# Patient Record
Sex: Male | Born: 1970 | Race: Black or African American | Hispanic: No | Marital: Single | Smoking: Former smoker
Health system: Southern US, Community
[De-identification: ages and names within clinical notes are randomized; demographics above are authoritative.]

## PROBLEM LIST (undated history)

## (undated) DIAGNOSIS — C419 Malignant neoplasm of bone and articular cartilage, unspecified: Secondary | ICD-10-CM

## (undated) DIAGNOSIS — E079 Disorder of thyroid, unspecified: Secondary | ICD-10-CM

## (undated) DIAGNOSIS — I10 Essential (primary) hypertension: Secondary | ICD-10-CM

## (undated) DIAGNOSIS — C801 Malignant (primary) neoplasm, unspecified: Secondary | ICD-10-CM

## (undated) DIAGNOSIS — J189 Pneumonia, unspecified organism: Secondary | ICD-10-CM

## (undated) DIAGNOSIS — Z923 Personal history of irradiation: Secondary | ICD-10-CM

## (undated) DIAGNOSIS — T7840XA Allergy, unspecified, initial encounter: Secondary | ICD-10-CM

## (undated) HISTORY — PX: TENDON REPAIR: SHX5111

## (undated) HISTORY — DX: Personal history of irradiation: Z92.3

## (undated) HISTORY — PX: THYROIDECTOMY: SHX17

---

## 2006-05-18 ENCOUNTER — Ambulatory Visit: Payer: Self-pay | Admitting: Nurse Practitioner

## 2006-05-19 ENCOUNTER — Ambulatory Visit (HOSPITAL_COMMUNITY): Admission: RE | Admit: 2006-05-19 | Discharge: 2006-05-19 | Payer: Self-pay | Admitting: Family Medicine

## 2007-03-21 ENCOUNTER — Emergency Department (HOSPITAL_COMMUNITY): Admission: EM | Admit: 2007-03-21 | Discharge: 2007-03-21 | Payer: Self-pay | Admitting: Family Medicine

## 2007-03-22 ENCOUNTER — Ambulatory Visit: Payer: Self-pay | Admitting: *Deleted

## 2007-04-06 ENCOUNTER — Emergency Department (HOSPITAL_COMMUNITY): Admission: EM | Admit: 2007-04-06 | Discharge: 2007-04-06 | Payer: Self-pay | Admitting: Family Medicine

## 2007-05-02 ENCOUNTER — Encounter (INDEPENDENT_AMBULATORY_CARE_PROVIDER_SITE_OTHER): Payer: Self-pay | Admitting: *Deleted

## 2007-05-14 ENCOUNTER — Emergency Department (HOSPITAL_COMMUNITY): Admission: EM | Admit: 2007-05-14 | Discharge: 2007-05-14 | Payer: Self-pay | Admitting: Family Medicine

## 2007-06-04 DIAGNOSIS — M2040 Other hammer toe(s) (acquired), unspecified foot: Secondary | ICD-10-CM

## 2012-11-16 ENCOUNTER — Emergency Department: Payer: Self-pay | Admitting: Emergency Medicine

## 2012-12-10 ENCOUNTER — Emergency Department: Payer: Self-pay | Admitting: Emergency Medicine

## 2012-12-10 LAB — COMPREHENSIVE METABOLIC PANEL
Albumin: 3.5 g/dL (ref 3.4–5.0)
Alkaline Phosphatase: 100 U/L (ref 50–136)
Anion Gap: 4 — ABNORMAL LOW (ref 7–16)
Calcium, Total: 8.6 mg/dL (ref 8.5–10.1)
Chloride: 103 mmol/L (ref 98–107)
Co2: 31 mmol/L (ref 21–32)
Creatinine: 1.05 mg/dL (ref 0.60–1.30)
EGFR (African American): >60
EGFR (Non-African Amer.): >60
Glucose: 110 mg/dL — ABNORMAL HIGH (ref 65–99)
SGOT(AST): 22 U/L (ref 15–37)
SGPT (ALT): 19 U/L (ref 12–78)
Sodium: 138 mmol/L (ref 136–145)
Total Protein: 7.1 g/dL (ref 6.4–8.2)

## 2012-12-10 LAB — URINALYSIS, COMPLETE
Nitrite: NEGATIVE
Ph: 6 (ref 4.5–8.0)
Protein: NEGATIVE
Specific Gravity: 1.02 (ref 1.003–1.030)
WBC UR: <1 /HPF (ref 0–5)

## 2012-12-10 LAB — CBC
MCH: 29.6 pg (ref 26.0–34.0)
MCHC: 33.2 g/dL (ref 32.0–36.0)
Platelet: 327 10*3/uL (ref 150–440)
RBC: 4.74 10*6/uL (ref 4.40–5.90)
WBC: 5.9 10*3/uL (ref 3.8–10.6)

## 2012-12-10 LAB — LIPASE, BLOOD: Lipase: 253 U/L (ref 73–393)

## 2013-01-01 ENCOUNTER — Ambulatory Visit: Payer: Self-pay | Admitting: Oncology

## 2013-01-01 LAB — COMPREHENSIVE METABOLIC PANEL
Alkaline Phosphatase: 92 U/L (ref 50–136)
Anion Gap: 6 — ABNORMAL LOW (ref 7–16)
BUN: 13 mg/dL (ref 7–18)
Bilirubin,Total: 0.4 mg/dL (ref 0.2–1.0)
Co2: 31 mmol/L (ref 21–32)
Glucose: 83 mg/dL (ref 65–99)
Osmolality: 279 (ref 275–301)
Potassium: 4.3 mmol/L (ref 3.5–5.1)
SGOT(AST): 15 U/L (ref 15–37)
SGPT (ALT): 22 U/L (ref 12–78)

## 2013-01-01 LAB — PROTIME-INR: INR: 0.9

## 2013-01-01 LAB — CBC CANCER CENTER
Basophil #: 0 x10 3/mm (ref 0.0–0.1)
Basophil %: 0.7 %
Eosinophil %: 2.2 %
HCT: 43.4 % (ref 40.0–52.0)
HGB: 14.6 g/dL (ref 13.0–18.0)
MCH: 30 pg (ref 26.0–34.0)
MCV: 90 fL (ref 80–100)
Monocyte #: 0.6 x10 3/mm (ref 0.2–1.0)
Monocyte %: 10 %
Neutrophil #: 3.1 x10 3/mm (ref 1.4–6.5)
RBC: 4.86 10*6/uL (ref 4.40–5.90)

## 2013-01-01 LAB — APTT: Activated PTT: 30.2 secs (ref 23.6–35.9)

## 2013-01-02 LAB — CEA: CEA: 1.4 ng/mL (ref 0.0–4.7)

## 2013-01-02 LAB — PSA: PSA: 1.7 ng/mL (ref 0.0–4.0)

## 2013-01-02 LAB — CANCER ANTIGEN 19-9: CA 19-9: 43 U/mL — ABNORMAL HIGH (ref 0–35)

## 2013-01-03 ENCOUNTER — Ambulatory Visit: Payer: Self-pay | Admitting: Oncology

## 2013-01-11 ENCOUNTER — Ambulatory Visit: Payer: Self-pay | Admitting: Oncology

## 2013-01-13 ENCOUNTER — Ambulatory Visit: Payer: Self-pay | Admitting: Oncology

## 2013-01-16 LAB — PATHOLOGY REPORT

## 2013-01-19 LAB — AFP TUMOR MARKER: AFP-Tumor Marker: 3.7 ng/mL (ref 0.0–8.3)

## 2013-01-19 LAB — BETA HCG QUANT (REF LAB)

## 2013-01-19 LAB — CEA: CEA: 1.6 ng/mL (ref 0.0–4.7)

## 2013-01-21 ENCOUNTER — Emergency Department: Payer: Self-pay | Admitting: Emergency Medicine

## 2013-01-21 LAB — COMPREHENSIVE METABOLIC PANEL
Anion Gap: 5 — ABNORMAL LOW (ref 7–16)
Calcium, Total: 8.4 mg/dL — ABNORMAL LOW (ref 8.5–10.1)
Chloride: 109 mmol/L — ABNORMAL HIGH (ref 98–107)
Co2: 29 mmol/L (ref 21–32)
Glucose: 91 mg/dL (ref 65–99)
Potassium: 4.1 mmol/L (ref 3.5–5.1)
SGOT(AST): 31 U/L (ref 15–37)
SGPT (ALT): 18 U/L (ref 12–78)
Sodium: 143 mmol/L (ref 136–145)

## 2013-01-21 LAB — CBC
MCHC: 32.8 g/dL (ref 32.0–36.0)
MCV: 91 fL (ref 80–100)
Platelet: 363 10*3/uL (ref 150–440)
RBC: 4.31 10*6/uL — ABNORMAL LOW (ref 4.40–5.90)
WBC: 7 10*3/uL (ref 3.8–10.6)

## 2013-01-21 LAB — URINALYSIS, COMPLETE
Bacteria: NONE SEEN
Blood: NEGATIVE
Glucose,UR: NEGATIVE mg/dL (ref 0–75)
Ketone: NEGATIVE
Leukocyte Esterase: NEGATIVE
Nitrite: NEGATIVE
Ph: 5 (ref 4.5–8.0)
Specific Gravity: 1.008 (ref 1.003–1.030)
Squamous Epithelial: <1
WBC UR: <1 /HPF (ref 0–5)

## 2013-01-21 LAB — ETHANOL: Ethanol: <3 mg/dL

## 2013-01-21 LAB — LIPASE, BLOOD: Lipase: 433 U/L — ABNORMAL HIGH (ref 73–393)

## 2013-01-22 ENCOUNTER — Emergency Department: Payer: Self-pay | Admitting: Emergency Medicine

## 2013-01-22 LAB — COMPREHENSIVE METABOLIC PANEL
Albumin: 3.3 g/dL — ABNORMAL LOW (ref 3.4–5.0)
Alkaline Phosphatase: 84 U/L (ref 50–136)
Chloride: 106 mmol/L (ref 98–107)
Creatinine: 1.03 mg/dL (ref 0.60–1.30)
EGFR (African American): >60
EGFR (Non-African Amer.): >60
Glucose: 89 mg/dL (ref 65–99)
Osmolality: 280 (ref 275–301)
Potassium: 3.7 mmol/L (ref 3.5–5.1)
SGOT(AST): 19 U/L (ref 15–37)
SGPT (ALT): 16 U/L (ref 12–78)
Sodium: 141 mmol/L (ref 136–145)
Total Protein: 6.4 g/dL (ref 6.4–8.2)

## 2013-01-22 LAB — URINALYSIS, COMPLETE
Bilirubin,UR: NEGATIVE
Blood: NEGATIVE
Glucose,UR: NEGATIVE mg/dL (ref 0–75)
Ketone: NEGATIVE
Ketone: NEGATIVE
Leukocyte Esterase: NEGATIVE
Nitrite: NEGATIVE
Nitrite: NEGATIVE
Ph: 6 (ref 4.5–8.0)
Ph: 7 (ref 4.5–8.0)
Protein: NEGATIVE
RBC,UR: <1 /HPF (ref 0–5)
Specific Gravity: 1.014 (ref 1.003–1.030)
Specific Gravity: 1.004 (ref 1.003–1.030)
Squamous Epithelial: <1
WBC UR: <1 /HPF (ref 0–5)

## 2013-01-22 LAB — CBC
HCT: 37.7 % — ABNORMAL LOW (ref 40.0–52.0)
MCH: 30.1 pg (ref 26.0–34.0)
MCHC: 33.4 g/dL (ref 32.0–36.0)
MCV: 90 fL (ref 80–100)
Platelet: 333 10*3/uL (ref 150–440)
RBC: 4.19 10*6/uL — ABNORMAL LOW (ref 4.40–5.90)
RDW: 13.9 % (ref 11.5–14.5)
WBC: 7.2 10*3/uL (ref 3.8–10.6)

## 2013-01-23 ENCOUNTER — Ambulatory Visit: Payer: Self-pay | Admitting: Unknown Physician Specialty

## 2013-01-29 LAB — CALCIUM
Calcium, Total: 8.9 mg/dL (ref 8.5–10.1)
Calcium, Total: 8.6 mg/dL (ref 8.5–10.1)

## 2013-01-30 ENCOUNTER — Inpatient Hospital Stay: Payer: Self-pay | Admitting: Oncology

## 2013-01-30 LAB — CALCIUM: Calcium, Total: 8.6 mg/dL (ref 8.5–10.1)

## 2013-01-30 LAB — PATHOLOGY REPORT

## 2013-02-03 LAB — COMPREHENSIVE METABOLIC PANEL
Albumin: 3 g/dL — ABNORMAL LOW (ref 3.4–5.0)
Anion Gap: 7 (ref 7–16)
BUN: 8 mg/dL (ref 7–18)
Calcium, Total: 8.8 mg/dL (ref 8.5–10.1)
Chloride: 105 mmol/L (ref 98–107)
EGFR (African American): >60
Osmolality: 278 (ref 275–301)
Potassium: 4.2 mmol/L (ref 3.5–5.1)
SGPT (ALT): 21 U/L (ref 12–78)
Sodium: 140 mmol/L (ref 136–145)
Total Protein: 6.1 g/dL — ABNORMAL LOW (ref 6.4–8.2)

## 2013-02-03 LAB — CBC WITH DIFFERENTIAL/PLATELET
Basophil %: 0.4 %
Eosinophil #: 0 10*3/uL (ref 0.0–0.7)
Eosinophil %: 0.3 %
HCT: 37.6 % — ABNORMAL LOW (ref 40.0–52.0)
Lymphocyte #: 1.5 10*3/uL (ref 1.0–3.6)
Lymphocyte %: 23.3 %
MCH: 29.9 pg (ref 26.0–34.0)
MCHC: 33.4 g/dL (ref 32.0–36.0)
MCV: 90 fL (ref 80–100)
Neutrophil #: 4.2 10*3/uL (ref 1.4–6.5)
Neutrophil %: 65.1 %
Platelet: 284 10*3/uL (ref 150–440)
RBC: 4.2 10*6/uL — ABNORMAL LOW (ref 4.40–5.90)
WBC: 6.5 10*3/uL (ref 3.8–10.6)

## 2013-02-12 ENCOUNTER — Ambulatory Visit: Payer: Self-pay | Admitting: Oncology

## 2013-03-15 ENCOUNTER — Ambulatory Visit: Payer: Self-pay | Admitting: Oncology

## 2013-03-25 LAB — TSH: Thyroid Stimulating Horm: 69.8 u[IU]/mL — ABNORMAL HIGH

## 2013-04-02 ENCOUNTER — Inpatient Hospital Stay: Payer: Self-pay | Admitting: Oncology

## 2013-04-02 LAB — CBC WITH DIFFERENTIAL/PLATELET
Basophil #: 0.1 10*3/uL (ref 0.0–0.1)
Basophil %: 0.9 %
Eosinophil #: 0.1 10*3/uL (ref 0.0–0.7)
HCT: 40.4 % (ref 40.0–52.0)
HGB: 13.6 g/dL (ref 13.0–18.0)
Lymphocyte #: 1.2 10*3/uL (ref 1.0–3.6)
Lymphocyte %: 19.8 %
MCHC: 33.8 g/dL (ref 32.0–36.0)
Monocyte #: 0.6 x10 3/mm (ref 0.2–1.0)
Monocyte %: 10.2 %
Neutrophil %: 68 %
RBC: 4.41 10*6/uL (ref 4.40–5.90)
RDW: 15.9 % — ABNORMAL HIGH (ref 11.5–14.5)

## 2013-04-02 LAB — COMPREHENSIVE METABOLIC PANEL
Alkaline Phosphatase: 96 U/L (ref 50–136)
Anion Gap: 4 — ABNORMAL LOW (ref 7–16)
BUN: 16 mg/dL (ref 7–18)
EGFR (African American): >60
EGFR (Non-African Amer.): >60
Glucose: 69 mg/dL (ref 65–99)
SGPT (ALT): 29 U/L (ref 12–78)
Sodium: 138 mmol/L (ref 136–145)
Total Protein: 5.9 g/dL — ABNORMAL LOW (ref 6.4–8.2)

## 2013-04-13 ENCOUNTER — Emergency Department: Payer: Self-pay | Admitting: Emergency Medicine

## 2013-04-13 LAB — COMPREHENSIVE METABOLIC PANEL
Albumin: 3.3 g/dL — ABNORMAL LOW (ref 3.4–5.0)
Alkaline Phosphatase: 107 U/L (ref 50–136)
BUN: 16 mg/dL (ref 7–18)
Bilirubin,Total: 0.6 mg/dL (ref 0.2–1.0)
Calcium, Total: 8.7 mg/dL (ref 8.5–10.1)
Creatinine: 1.15 mg/dL (ref 0.60–1.30)
EGFR (Non-African Amer.): >60
Glucose: 84 mg/dL (ref 65–99)
Potassium: 3.8 mmol/L (ref 3.5–5.1)
SGOT(AST): 15 U/L (ref 15–37)

## 2013-04-13 LAB — URINALYSIS, COMPLETE
Blood: NEGATIVE
Glucose,UR: NEGATIVE mg/dL (ref 0–75)
Ketone: NEGATIVE
Nitrite: NEGATIVE
Ph: 5 (ref 4.5–8.0)
Protein: NEGATIVE
RBC,UR: <1 /HPF (ref 0–5)
Squamous Epithelial: <1
WBC UR: 4 /HPF (ref 0–5)

## 2013-04-13 LAB — CBC
MCHC: 33.5 g/dL (ref 32.0–36.0)
MCV: 92 fL (ref 80–100)
RBC: 4.51 10*6/uL (ref 4.40–5.90)
WBC: 6.1 10*3/uL (ref 3.8–10.6)

## 2013-04-15 ENCOUNTER — Ambulatory Visit: Payer: Self-pay | Admitting: Oncology

## 2013-07-26 ENCOUNTER — Emergency Department (HOSPITAL_COMMUNITY): Payer: Medicaid Other

## 2013-07-26 ENCOUNTER — Emergency Department (HOSPITAL_COMMUNITY)
Admission: EM | Admit: 2013-07-26 | Discharge: 2013-07-26 | Discharge status: Against Medical Advice | Payer: Medicaid Other | Attending: Emergency Medicine | Admitting: Emergency Medicine

## 2013-07-26 ENCOUNTER — Encounter (HOSPITAL_COMMUNITY): Payer: Self-pay | Admitting: Emergency Medicine

## 2013-07-26 DIAGNOSIS — Z79899 Other long term (current) drug therapy: Secondary | ICD-10-CM | POA: Insufficient documentation

## 2013-07-26 DIAGNOSIS — M8448XA Pathological fracture, other site, initial encounter for fracture: Secondary | ICD-10-CM | POA: Insufficient documentation

## 2013-07-26 DIAGNOSIS — E079 Disorder of thyroid, unspecified: Secondary | ICD-10-CM | POA: Insufficient documentation

## 2013-07-26 DIAGNOSIS — Z859 Personal history of malignant neoplasm, unspecified: Secondary | ICD-10-CM | POA: Insufficient documentation

## 2013-07-26 DIAGNOSIS — C50919 Malignant neoplasm of unspecified site of unspecified female breast: Secondary | ICD-10-CM | POA: Insufficient documentation

## 2013-07-26 DIAGNOSIS — C799 Secondary malignant neoplasm of unspecified site: Secondary | ICD-10-CM

## 2013-07-26 DIAGNOSIS — Z791 Long term (current) use of non-steroidal anti-inflammatories (NSAID): Secondary | ICD-10-CM | POA: Insufficient documentation

## 2013-07-26 DIAGNOSIS — M542 Cervicalgia: Secondary | ICD-10-CM | POA: Insufficient documentation

## 2013-07-26 DIAGNOSIS — I10 Essential (primary) hypertension: Secondary | ICD-10-CM | POA: Insufficient documentation

## 2013-07-26 DIAGNOSIS — Z9889 Other specified postprocedural states: Secondary | ICD-10-CM | POA: Insufficient documentation

## 2013-07-26 DIAGNOSIS — C7951 Secondary malignant neoplasm of bone: Secondary | ICD-10-CM | POA: Insufficient documentation

## 2013-07-26 DIAGNOSIS — Z87891 Personal history of nicotine dependence: Secondary | ICD-10-CM | POA: Insufficient documentation

## 2013-07-26 HISTORY — DX: Disorder of thyroid, unspecified: E07.9

## 2013-07-26 HISTORY — DX: Malignant (primary) neoplasm, unspecified: C80.1

## 2013-07-26 HISTORY — DX: Essential (primary) hypertension: I10

## 2013-07-26 LAB — URINE MICROSCOPIC-ADD ON

## 2013-07-26 LAB — CBC WITH DIFFERENTIAL/PLATELET
Basophils Relative: 0 % (ref 0–1)
Eosinophils Absolute: 0.1 10*3/uL (ref 0.0–0.7)
Eosinophils Relative: 1 % (ref 0–5)
MCH: 33.4 pg (ref 26.0–34.0)
MCHC: 32.9 g/dL (ref 30.0–36.0)
Neutrophils Relative %: 66 % (ref 43–77)
Platelets: 254 10*3/uL (ref 150–400)
RDW: 14.1 % (ref 11.5–15.5)

## 2013-07-26 LAB — URINALYSIS, ROUTINE W REFLEX MICROSCOPIC
Bilirubin Urine: NEGATIVE
Nitrite: POSITIVE — AB
Protein, ur: NEGATIVE mg/dL
Specific Gravity, Urine: 1.018 (ref 1.005–1.030)
Urobilinogen, UA: 1 mg/dL (ref 0.0–1.0)

## 2013-07-26 LAB — BASIC METABOLIC PANEL
Calcium: 9 mg/dL (ref 8.4–10.5)
GFR calc Af Amer: 75 mL/min — ABNORMAL LOW (ref >90)
GFR calc non Af Amer: 65 mL/min — ABNORMAL LOW (ref >90)
Potassium: 3.4 mEq/L — ABNORMAL LOW (ref 3.5–5.1)
Sodium: 138 mEq/L (ref 135–145)

## 2013-07-26 MED ORDER — ONDANSETRON HCL 4 MG/2ML IJ SOLN: 4.0000 mg | Freq: Once | INTRAMUSCULAR | Status: DC

## 2013-07-26 MED ORDER — HYDROMORPHONE HCL PF 1 MG/ML IJ SOLN: 1.0000 mg | Freq: Once | INTRAMUSCULAR | Status: DC

## 2013-07-26 MED ORDER — IOHEXOL 300 MG/ML  SOLN
80.0000 mL | Freq: Once | INTRAMUSCULAR | Status: AC | PRN
  Administered 2013-07-26: 80 mL via INTRAVENOUS

## 2013-07-26 NOTE — ED Notes (Signed)
Pt informed that CT scans are down and it might take more time for him to go to CT. Pt angry and got out his room looking for a Doctor, states "this is a conspiracy." Pt informed that if he needs to talk to the doctor, we'll let the doctor know. Pt informed that he needs to stay in his room. Pt calling names using [facility]. Security made aware.

## 2013-07-26 NOTE — ED Notes (Signed)
Pt has thyroid cancer diagnosed in May that is being treated at [facility].  Pt began having R arm (around collar bone) and neck pain about 3 weeks ago.  Pt presents today for evaluation of that pain.

## 2013-07-26 NOTE — ED Provider Notes (Signed)
CSN: [id number]     Arrival date & time 07/26/13  1316 History   First MD Initiated Contact with Patient 07/26/13 1511     Chief Complaint  Patient presents with  . Arm Pain  . Neck Pain   (Consider location/radiation/quality/duration/timing/severity/associated sxs/prior Treatment) HPI Comments: Frank Ward is a 42 y.o. Male presents for right shoulder pain that is gradual in onset for several days. He also has noted a nodule in his right lower back for the last several days. He denies weakness, dizziness, nausea, vomiting, fever, chills, shortness of breath, chest pain or problems with urination and bowel movements. He has ongoing lower back pain since he had a biopsy that showed cancer in his vertebra. He stopped seeing his oncologist 3 months ago after a radionuclear treatment. He does not want to go back to see the oncologist because of a "miscommunication". He wants to be referred to another oncologist. He is not taking any medications currently. There are no other known modifying factors.  Patient is a 42 y.o. male presenting with arm pain and neck pain. The history is provided by the patient.  Arm Pain  Neck Pain   Past Medical History  Diagnosis Date  . Cancer     Thyroid  . Thyroid disease   . Hypertension    Past Surgical History  Procedure Laterality Date  . Tendon repair    . Thyroidectomy     No family history on file. History  Substance Use Topics  . Smoking status: Former Games developer  . Smokeless tobacco: Not on file  . Alcohol Use: 4.8 oz/week    8 Cans of beer per week    Review of Systems  Musculoskeletal: Positive for neck pain.  All other systems reviewed and are negative.    Allergies  Review of patient's allergies indicates no known allergies.  Home Medications   Current Outpatient Rx  Name  Route  Sig  Dispense  Refill  . Acetaminophen-Pamabrom 500-25 MG TABS   Oral   Take 1 tablet by mouth 2 (two) times daily as needed (pain).         .  bisacodyl (DULCOLAX) 5 MG EC tablet   Oral   Take 5 mg by mouth 2 (two) times daily.         . Calcium Carb-Cholecalciferol (CALCIUM 500 +D) 500-400 MG-UNIT TABS   Oral   Take 2 tablets by mouth 2 (two) times daily.         Marland Kitchen dexamethasone (DECADRON) 4 MG tablet   Oral   Take 4 mg by mouth daily.         Marland Kitchen levothyroxine (SYNTHROID, LEVOTHROID) 100 MCG tablet   Oral   Take 100 mcg by mouth daily before breakfast.         . Menthol, Topical Analgesic, 5 % PADS   Apply externally   Apply 1 patch topically daily as needed (pain).         . Multiple Vitamin (MULTIVITAMIN WITH MINERALS) TABS tablet   Oral   Take 1 tablet by mouth daily.         . naproxen sodium (ANAPROX) 220 MG tablet   Oral   Take 440 mg by mouth 2 (two) times daily.         . phenazopyridine (PYRIDIUM) 97 MG tablet   Oral   Take 97 mg by mouth every 3 (three) days.          BP 138/98  Pulse 72  Temp(Src) 98.1 F (36.7 C) (Oral)  Resp 18  Ht 6\' 4"  (1.93 m)  Wt 196 lb 1 oz (88.933 kg)  BMI 23.88 kg/m2  SpO2 99% Physical Exam  Nursing note and vitals reviewed. Constitutional: He is oriented to person, place, and time. He appears well-developed and well-nourished. No distress.  HENT:  Head: Normocephalic and atraumatic.  Right Ear: External ear normal.  Left Ear: External ear normal.  Eyes: Conjunctivae and EOM are normal. Pupils are equal, round, and reactive to light.  Neck: Normal range of motion and phonation normal. Neck supple.  Cardiovascular: Normal rate, regular rhythm, normal heart sounds and intact distal pulses.   Pulmonary/Chest: Effort normal and breath sounds normal. He exhibits no bony tenderness.  Abdominal: Soft. Normal appearance. There is no tenderness.  Musculoskeletal: Normal range of motion.  Pain and limited motion of the right shoulder. No shoulder deformity.   Neurological: He is alert and oriented to person, place, and time. No cranial nerve deficit or  sensory deficit. He exhibits normal muscle tone. Coordination normal.  Skin: Skin is warm, dry and intact.  2 cm, mobile nodule in the right trapezius area that appears to be subcutaneous. There is no overlying erythema or skin deformity  Psychiatric: He has a normal mood and affect. His behavior is normal. Judgment and thought content normal.    ED Course  Procedures (including critical care time) Medications  HYDROmorphone (DILAUDID) injection 1 mg (1 mg Intravenous Not Given 07/26/13 1957)  ondansetron (ZOFRAN) injection 4 mg (4 mg Intravenous Not Given 07/26/13 1958)  iohexol (OMNIPAQUE) 300 MG/ML solution 80 mL (80 mLs Intravenous Contrast Given 07/26/13 1853)    Patient Vitals for the past 24 hrs:  BP Temp Temp src Pulse Resp SpO2 Height Weight  07/26/13 1955 - - - - 18 - - -  07/26/13 1952 138/98 mmHg - - 72 - 99 % - -  07/26/13 1753 142/106 mmHg 98.1 F (36.7 C) Oral 79 18 99 % - -  07/26/13 1326 128/96 mmHg 97.7 F (36.5 C) Oral 85 14 98 % 6\' 4"  (1.93 m) 196 lb 1 oz (88.933 kg)    7:27PM-Consult complete with Dr. Danielle Dess. Patient case explained and discussed. 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   20:00- the patient is choosing to leave AGAINST MEDICAL ADVICE. He understands that he is a potential serious complications with the spinal canal tumor. He, states that he will return to the emergency department at [facility] tomorrow, to continue his treatment. He has the capacity to choose to leave.        Labs Review Labs Reviewed  CBC WITH DIFFERENTIAL - Abnormal; Notable for the following:    RBC 3.77 (*)    Hemoglobin 12.6 (*)    HCT 38.3 (*)    MCV 101.6 (*)    Monocytes Relative 13 (*)    All other components within normal limits  BASIC METABOLIC PANEL - Abnormal; Notable for the following:    Potassium 3.4 (*)    GFR calc non Af Amer 65 (*)    GFR calc Af Amer 75 (*)    All other components within  normal limits  URINALYSIS, ROUTINE W REFLEX MICROSCOPIC - Abnormal; Notable for the following:    Color, Urine AMBER (*)    Nitrite POSITIVE (*)    All other components within normal limits  URINE MICROSCOPIC-ADD ON   Imaging Review Ct Chest W  Contrast  07/26/2013   CLINICAL DATA:  Thyroid cancer.  Neck and arm pain.  EXAM: CT CHEST WITH CONTRAST  TECHNIQUE: Multidetector CT imaging of the chest was performed during intravenous contrast administration.  CONTRAST:  80mL OMNIPAQUE IOHEXOL 300 MG/ML  SOLN  COMPARISON:  Most recent CT chest performed 01/21/2013.  FINDINGS: Widespread pulmonary metastatic disease is re- demonstrated and may be slightly improved following I-131 treatment. Index lesion in the left lower lobe now measures 21 x 25 mm as compared with 22 x 26 mm. Similarly other widespread pulmonary nodules remain present but stable to slightly smaller. Previously noted enlarged right thyroid gland, representing tumor, has significantly regressed.  No pathologic adenopathy. Normal heart size. Normal aorta and pulmonary arteries. No pleural effusion or pneumothorax.  Previously demonstrated T9 metastasis is improved with regard to intraspinal tumor and paravertebral extension. Significant destruction of the left side of the T9 vertebral body at that level with moderate loss of height. Osseous metastatic disease at T12 is re- demonstrated but stable. Tumor in the T1 vertebral body appears stable to slightly improved. No definite rib lesions. Upper abdominal structures unremarkable.  IMPRESSION: Stable to slightly improved widespread pulmonary metastatic disease.  Stable to slightly improved osseous metastatic disease in the thoracic spine including T1, T9, and T12.  Please see CT cervical spine dictation for additional findings.   Electronically Signed   By: Davonna Belling M.D.   On: 07/26/2013 19:56   Ct Cervical Spine Wo Contrast  07/26/2013   CLINICAL DATA:  Thyroid cancer.  Right arm pain.   EXAM: CT CERVICAL SPINE WITHOUT CONTRAST  TECHNIQUE: Multidetector CT imaging of the cervical spine was performed without intravenous contrast. Multiplanar CT image reconstructions were also generated.  COMPARISON:  Multiple prior studies.  CT scan performed 01/03/2013.  FINDINGS: There are pathologic fractures involving the C3 and C4 vertebral bodies with near complete destruction of central marrow at both levels. Slight osseous retropulsion at C3 with tumor in the canal resulting in mild stenosis. Canal diameter 5-7 mm at its most narrow opposite C3. Tumor extends into the right greater than left neural foramina at C2-3, C3-4, and C4-5 compressing the exiting nerve roots at that location. These likely contribute to right arm pain. Minor changes of spondylosis at C5-6.  Small Osseous lesions are observed in the C2 vertebral body and spinous process, and C6 spinous process. No other pathologic compression deformities. Thoracic metastatic disease described in the CT chest dictation. Slight prevertebral soft tissue swelling/ tumor is evident anterior to C3 and C4. Airway midline. Cervical lymph nodes bilaterally could be reactive or represent metastatic disease; correlate with PET-CT. Thyroid incompletely evaluated. Lung apices described in CT chest dictation.  IMPRESSION: Pathologic fractures involving the C3 and C4 vertebral bodies. Tumor in the canal and right-sided neural foramina at C2-3, C3-4, and C4-5. These were noted on previous PET scan but have significantly progressed. Further collapse could result in spinal stenosis and cord compression.   Electronically Signed   By: Davonna Belling M.D.   On: 07/26/2013 19:46      EKG Interpretation   None       MDM   1. 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 Patient is high risk for paralysis if he sustains any cervical vertebrae fracture. He understands the seriousness of this  problem. He is choosing to leave AGAINST MEDICAL ADVICE to do some things at home. Prior to returning to the hospital  for treatment.  Nursing Notes Reviewed/ Care Coordinated, and agree without changes. Applicable Imaging Reviewed.  Interpretation of Laboratory Data incorporated into ED treatment   Plan: Discharge AGAINST MEDICAL ADVICE    Flint Melter, MD 07/26/13 2016

## 2013-07-26 NOTE — Consult Note (Signed)
Reason for Consult:metastatic tumor to C3 and C4 Referring Physician: Dr. Mora Bellman Murdaugh is an 42 y.o. male.  HPI: 42y.o. Male with known thyroid cancer. Was treated in [place] system. Came to ER tonight with increasing right arm pain. Ct shows destructive lesion of C3 and C4. Mild canal compromise by CT.  Past Medical History  Diagnosis Date  . Cancer     Thyroid  . Thyroid disease   . Hypertension     Past Surgical History  Procedure Laterality Date  . Tendon repair    . Thyroidectomy      No family history on file.  Social History:  reports that he has quit smoking. He does not have any smokeless tobacco history on file. He reports that he drinks about 4.8 ounces of alcohol per week. He reports that he uses illicit drugs (Marijuana).  Allergies: No Known Allergies  Medications: I have reviewed the patient's current medications.  Results for orders placed during the hospital encounter of 07/26/13 (from the past 48 hour(s))  CBC WITH DIFFERENTIAL     Status: Abnormal   Collection Time    07/26/13  5:17 PM      Result Value Range   WBC 7.6  4.0 - 10.5 K/uL   RBC 3.77 (*) 4.22 - 5.81 MIL/uL   Hemoglobin 12.6 (*) 13.0 - 17.0 g/dL   HCT 11.9 (*) 14.7 - 82.9 %   MCV 101.6 (*) 78.0 - 100.0 fL   MCH 33.4  26.0 - 34.0 pg   MCHC 32.9  30.0 - 36.0 g/dL   RDW 56.2  13.0 - 86.5 %   Platelets 254  150 - 400 K/uL   Neutrophils Relative % 66  43 - 77 %   Neutro Abs 5.1  1.7 - 7.7 K/uL   Lymphocytes Relative 19  12 - 46 %   Lymphs Abs 1.5  0.7 - 4.0 K/uL   Monocytes Relative 13 (*) 3 - 12 %   Monocytes Absolute 1.0  0.1 - 1.0 K/uL   Eosinophils Relative 1  0 - 5 %   Eosinophils Absolute 0.1  0.0 - 0.7 K/uL   Basophils Relative 0  0 - 1 %   Basophils Absolute 0.0  0.0 - 0.1 K/uL  BASIC METABOLIC PANEL     Status: Abnormal   Collection Time    07/26/13  5:17 PM      Result Value Range   Sodium 138  135 - 145 mEq/L   Potassium 3.4 (*) 3.5 - 5.1 mEq/L   Chloride 100  96  - 112 mEq/L   CO2 30  19 - 32 mEq/L   Glucose, Bld 76  70 - 99 mg/dL   BUN 19  6 - 23 mg/dL   Creatinine, Ser 7.84  0.50 - 1.35 mg/dL   Calcium 9.0  8.4 - 69.6 mg/dL   GFR calc non Af Amer 65 (*) >90 mL/min   GFR calc Af Amer 75 (*) >90 mL/min   Comment: (NOTE)     The eGFR has been calculated using the CKD EPI equation.     This calculation has not been validated in all clinical situations.     eGFR's persistently <90 mL/min signify possible Chronic Kidney     Disease.  URINALYSIS, ROUTINE W REFLEX MICROSCOPIC     Status: Abnormal   Collection Time    07/26/13  5:54 PM      Result Value Range   Color, Urine AMBER (*)  YELLOW   Comment: BIOCHEMICALS MAY BE AFFECTED BY COLOR   APPearance CLEAR  CLEAR   Specific Gravity, Urine 1.018  1.005 - 1.030   pH 7.0  5.0 - 8.0   Glucose, UA NEGATIVE  NEGATIVE mg/dL   Hgb urine dipstick NEGATIVE  NEGATIVE   Bilirubin Urine NEGATIVE  NEGATIVE   Ketones, ur NEGATIVE  NEGATIVE mg/dL   Protein, ur NEGATIVE  NEGATIVE mg/dL   Urobilinogen, UA 1.0  0.0 - 1.0 mg/dL   Nitrite POSITIVE (*) NEGATIVE   Leukocytes, UA NEGATIVE  NEGATIVE  URINE MICROSCOPIC-ADD ON     Status: None   Collection Time    07/26/13  5:54 PM      Result Value Range   Squamous Epithelial / LPF RARE  RARE   WBC, UA 0-2  <3 WBC/hpf   Bacteria, UA RARE  RARE    Ct Chest W Contrast  07/26/2013   CLINICAL DATA:  Thyroid cancer.  Neck and arm pain.  EXAM: CT CHEST WITH CONTRAST  TECHNIQUE: Multidetector CT imaging of the chest was performed during intravenous contrast administration.  CONTRAST:  80mL OMNIPAQUE IOHEXOL 300 MG/ML  SOLN  COMPARISON:  Most recent CT chest performed 01/21/2013.  FINDINGS: Widespread pulmonary metastatic disease is re- demonstrated and may be slightly improved following I-131 treatment. Index lesion in the left lower lobe now measures 21 x 25 mm as compared with 22 x 26 mm. Similarly other widespread pulmonary nodules remain present but stable to slightly  smaller. Previously noted enlarged right thyroid gland, representing tumor, has significantly regressed.  No pathologic adenopathy. Normal heart size. Normal aorta and pulmonary arteries. No pleural effusion or pneumothorax.  Previously demonstrated T9 metastasis is improved with regard to intraspinal tumor and paravertebral extension. Significant destruction of the left side of the T9 vertebral body at that level with moderate loss of height. Osseous metastatic disease at T12 is re- demonstrated but stable. Tumor in the T1 vertebral body appears stable to slightly improved. No definite rib lesions. Upper abdominal structures unremarkable.  IMPRESSION: Stable to slightly improved widespread pulmonary metastatic disease.  Stable to slightly improved osseous metastatic disease in the thoracic spine including T1, T9, and T12.  Please see CT cervical spine dictation for additional findings.   Electronically Signed   By: Davonna Belling M.D.   On: 07/26/2013 19:56   Ct Cervical Spine Wo Contrast  07/26/2013   CLINICAL DATA:  Thyroid cancer.  Right arm pain.  EXAM: CT CERVICAL SPINE WITHOUT CONTRAST  TECHNIQUE: Multidetector CT imaging of the cervical spine was performed without intravenous contrast. Multiplanar CT image reconstructions were also generated.  COMPARISON:  Multiple prior studies.  CT scan performed 01/03/2013.  FINDINGS: There are pathologic fractures involving the C3 and C4 vertebral bodies with near complete destruction of central marrow at both levels. Slight osseous retropulsion at C3 with tumor in the canal resulting in mild stenosis. Canal diameter 5-7 mm at its most narrow opposite C3. Tumor extends into the right greater than left neural foramina at C2-3, C3-4, and C4-5 compressing the exiting nerve roots at that location. These likely contribute to right arm pain. Minor changes of spondylosis at C5-6.  Small Osseous lesions are observed in the C2 vertebral body and spinous process, and C6 spinous  process. No other pathologic compression deformities. Thoracic metastatic disease described in the CT chest dictation. Slight prevertebral soft tissue swelling/ tumor is evident anterior to C3 and C4. Airway midline. Cervical lymph nodes bilaterally could be reactive  or represent metastatic disease; correlate with PET-CT. Thyroid incompletely evaluated. Lung apices described in CT chest dictation.  IMPRESSION: Pathologic fractures involving the C3 and C4 vertebral bodies. Tumor in the canal and right-sided neural foramina at C2-3, C3-4, and C4-5. These were noted on previous PET scan but have significantly progressed. Further collapse could result in spinal stenosis and cord compression.   Electronically Signed   By: Davonna Belling M.D.   On: 07/26/2013 19:46    ROS Blood pressure 138/98, pulse 72, temperature 98.1 F (36.7 C), temperature source Oral, resp. rate 18, height 6\' 4"  (1.93 m), weight 88.933 kg (196 lb 1 oz), SpO2 99.00%. Physical Exam  Assessment/Plan: Destructive lesion of C3 and C4. Will need Mri OF C SPINE and further staging assessments.May very well require surgical reconstruction of C3 and C4.  Kendarrius Tanzi J 07/26/2013, 10:55 PM

## 2013-08-02 ENCOUNTER — Telehealth: Payer: Self-pay | Admitting: Hematology and Oncology

## 2013-08-02 NOTE — Telephone Encounter (Signed)
Left pt vm to return call in ref to np appt. °

## 2013-08-28 ENCOUNTER — Emergency Department (HOSPITAL_COMMUNITY): Payer: Medicaid Other

## 2013-08-28 ENCOUNTER — Inpatient Hospital Stay (HOSPITAL_COMMUNITY)
Admission: EM | Admit: 2013-08-28 | Discharge: 2013-09-09 | DRG: 055 | Disposition: A | Discharge status: Home / Self Care | Payer: Medicaid Other | Attending: Family Medicine | Admitting: Family Medicine

## 2013-08-28 ENCOUNTER — Encounter (HOSPITAL_COMMUNITY): Payer: Self-pay | Admitting: Emergency Medicine

## 2013-08-28 DIAGNOSIS — C73 Malignant neoplasm of thyroid gland: Secondary | ICD-10-CM | POA: Diagnosis present

## 2013-08-28 DIAGNOSIS — Z87891 Personal history of nicotine dependence: Secondary | ICD-10-CM

## 2013-08-28 DIAGNOSIS — G589 Mononeuropathy, unspecified: Secondary | ICD-10-CM | POA: Diagnosis present

## 2013-08-28 DIAGNOSIS — I10 Essential (primary) hypertension: Secondary | ICD-10-CM | POA: Diagnosis present

## 2013-08-28 DIAGNOSIS — C7949 Secondary malignant neoplasm of other parts of nervous system: Principal | ICD-10-CM

## 2013-08-28 DIAGNOSIS — E079 Disorder of thyroid, unspecified: Secondary | ICD-10-CM | POA: Diagnosis present

## 2013-08-28 DIAGNOSIS — C7931 Secondary malignant neoplasm of brain: Principal | ICD-10-CM | POA: Diagnosis present

## 2013-08-28 DIAGNOSIS — C7800 Secondary malignant neoplasm of unspecified lung: Secondary | ICD-10-CM | POA: Diagnosis present

## 2013-08-28 DIAGNOSIS — F1210 Cannabis abuse, uncomplicated: Secondary | ICD-10-CM | POA: Diagnosis present

## 2013-08-28 DIAGNOSIS — M8448XA Pathological fracture, other site, initial encounter for fracture: Secondary | ICD-10-CM | POA: Diagnosis present

## 2013-08-28 DIAGNOSIS — K5900 Constipation, unspecified: Secondary | ICD-10-CM | POA: Diagnosis not present

## 2013-08-28 DIAGNOSIS — M2040 Other hammer toe(s) (acquired), unspecified foot: Secondary | ICD-10-CM

## 2013-08-28 DIAGNOSIS — N179 Acute kidney failure, unspecified: Secondary | ICD-10-CM | POA: Diagnosis present

## 2013-08-28 DIAGNOSIS — C801 Malignant (primary) neoplasm, unspecified: Secondary | ICD-10-CM | POA: Diagnosis present

## 2013-08-28 DIAGNOSIS — S129XXA Fracture of neck, unspecified, initial encounter: Secondary | ICD-10-CM | POA: Diagnosis present

## 2013-08-28 DIAGNOSIS — Z923 Personal history of irradiation: Secondary | ICD-10-CM

## 2013-08-28 DIAGNOSIS — C7952 Secondary malignant neoplasm of bone marrow: Secondary | ICD-10-CM

## 2013-08-28 DIAGNOSIS — D649 Anemia, unspecified: Secondary | ICD-10-CM

## 2013-08-28 DIAGNOSIS — F4322 Adjustment disorder with anxiety: Secondary | ICD-10-CM

## 2013-08-28 DIAGNOSIS — C7951 Secondary malignant neoplasm of bone: Secondary | ICD-10-CM

## 2013-08-28 DIAGNOSIS — Z79899 Other long term (current) drug therapy: Secondary | ICD-10-CM

## 2013-08-28 LAB — CBC WITH DIFFERENTIAL/PLATELET
BASOS ABS: 0 10*3/uL (ref 0.0–0.1)
Basophils Relative: 1 % (ref 0–1)
Eosinophils Absolute: 0 10*3/uL (ref 0.0–0.7)
Eosinophils Relative: 1 % (ref 0–5)
HEMATOCRIT: 37.3 % — AB (ref 39.0–52.0)
Hemoglobin: 12.8 g/dL — ABNORMAL LOW (ref 13.0–17.0)
LYMPHS PCT: 21 % (ref 12–46)
Lymphs Abs: 0.9 10*3/uL (ref 0.7–4.0)
MCH: 32.5 pg (ref 26.0–34.0)
MCHC: 34.3 g/dL (ref 30.0–36.0)
MCV: 94.7 fL (ref 78.0–100.0)
Monocytes Absolute: 0.6 10*3/uL (ref 0.1–1.0)
Monocytes Relative: 13 % — ABNORMAL HIGH (ref 3–12)
NEUTROS ABS: 2.7 10*3/uL (ref 1.7–7.7)
Neutrophils Relative %: 64 % (ref 43–77)
PLATELETS: 293 10*3/uL (ref 150–400)
RBC: 3.94 MIL/uL — ABNORMAL LOW (ref 4.22–5.81)
RDW: 12.6 % (ref 11.5–15.5)
WBC: 4.3 10*3/uL (ref 4.0–10.5)

## 2013-08-28 LAB — BASIC METABOLIC PANEL
BUN: 6 mg/dL (ref 6–23)
CHLORIDE: 101 meq/L (ref 96–112)
CO2: 23 meq/L (ref 19–32)
Calcium: 9.3 mg/dL (ref 8.4–10.5)
Creatinine, Ser: 1.42 mg/dL — ABNORMAL HIGH (ref 0.50–1.35)
GFR calc Af Amer: 68 mL/min — ABNORMAL LOW (ref >90)
GFR calc non Af Amer: 59 mL/min — ABNORMAL LOW (ref >90)
Glucose, Bld: 83 mg/dL (ref 70–99)
Potassium: 3.9 mEq/L (ref 3.7–5.3)
SODIUM: 139 meq/L (ref 137–147)

## 2013-08-28 LAB — CBC
HEMATOCRIT: 37.7 % — AB (ref 39.0–52.0)
HEMOGLOBIN: 12.9 g/dL — AB (ref 13.0–17.0)
MCH: 32.7 pg (ref 26.0–34.0)
MCHC: 34.2 g/dL (ref 30.0–36.0)
MCV: 95.4 fL (ref 78.0–100.0)
Platelets: 310 10*3/uL (ref 150–400)
RBC: 3.95 MIL/uL — AB (ref 4.22–5.81)
RDW: 12.5 % (ref 11.5–15.5)
WBC: 4 10*3/uL (ref 4.0–10.5)

## 2013-08-28 LAB — PROTIME-INR
INR: 0.96 (ref 0.00–1.49)
PROTHROMBIN TIME: 12.6 s (ref 11.6–15.2)

## 2013-08-28 LAB — CREATININE, SERUM
Creatinine, Ser: 1.3 mg/dL (ref 0.50–1.35)
GFR, EST AFRICAN AMERICAN: 76 mL/min — AB (ref >90)
GFR, EST NON AFRICAN AMERICAN: 65 mL/min — AB (ref >90)

## 2013-08-28 MED ORDER — GADOBENATE DIMEGLUMINE 529 MG/ML IV SOLN
17.0000 mL | Freq: Once | INTRAVENOUS | Status: AC | PRN
  Administered 2013-08-28: 17 mL via INTRAVENOUS

## 2013-08-28 MED ORDER — BISACODYL 5 MG PO TBEC
5.0000 mg | DELAYED_RELEASE_TABLET | Freq: Every day | ORAL | Status: DC
  Administered 2013-08-28 – 2013-09-09 (×13): 5 mg via ORAL
  Filled 2013-08-28 (×13): qty 1

## 2013-08-28 MED ORDER — MORPHINE SULFATE 2 MG/ML IJ SOLN
2.0000 mg | INTRAMUSCULAR | Status: DC | PRN
  Administered 2013-08-28 – 2013-08-31 (×13): 2 mg via INTRAVENOUS
  Filled 2013-08-28 (×13): qty 1

## 2013-08-28 MED ORDER — GUAIFENESIN-DM 100-10 MG/5ML PO SYRP: 5.0000 mL | ORAL_SOLUTION | ORAL | Status: DC | PRN

## 2013-08-28 MED ORDER — LEVOTHYROXINE SODIUM 100 MCG PO TABS
100.0000 ug | ORAL_TABLET | Freq: Every day | ORAL | Status: DC
  Administered 2013-08-29 – 2013-09-09 (×12): 100 ug via ORAL
  Filled 2013-08-28 (×15): qty 1

## 2013-08-28 MED ORDER — POLYETHYLENE GLYCOL 3350 17 G PO PACK
17.0000 g | PACK | Freq: Every day | ORAL | Status: DC | PRN
  Administered 2013-08-31 – 2013-09-07 (×7): 17 g via ORAL
  Filled 2013-08-28 (×9): qty 1

## 2013-08-28 MED ORDER — ADULT MULTIVITAMIN W/MINERALS CH
1.0000 | ORAL_TABLET | Freq: Every day | ORAL | Status: DC
  Administered 2013-08-29 – 2013-09-09 (×12): 1 via ORAL
  Filled 2013-08-28 (×12): qty 1

## 2013-08-28 MED ORDER — INFLUENZA VAC SPLIT QUAD 0.5 ML IM SUSP
0.5000 mL | INTRAMUSCULAR | Status: AC
  Administered 2013-08-29: 0.5 mL via INTRAMUSCULAR
  Filled 2013-08-28 (×2): qty 0.5

## 2013-08-28 MED ORDER — MORPHINE SULFATE 4 MG/ML IJ SOLN
4.0000 mg | INTRAMUSCULAR | Status: AC | PRN
  Administered 2013-08-28 – 2013-08-30 (×3): 4 mg via INTRAVENOUS
  Filled 2013-08-28 (×3): qty 1

## 2013-08-28 MED ORDER — HEPARIN SODIUM (PORCINE) 5000 UNIT/ML IJ SOLN
5000.0000 [IU] | Freq: Three times a day (TID) | INTRAMUSCULAR | Status: DC
Start: 2013-08-28 — End: 2013-09-09
  Administered 2013-08-28 – 2013-09-01 (×8): 5000 [IU] via SUBCUTANEOUS
  Filled 2013-08-28 (×38): qty 1

## 2013-08-28 MED ORDER — ONDANSETRON HCL 4 MG/2ML IJ SOLN: 4.0000 mg | Freq: Four times a day (QID) | INTRAMUSCULAR | Status: DC | PRN

## 2013-08-28 MED ORDER — ONDANSETRON HCL 4 MG PO TABS
4.0000 mg | ORAL_TABLET | Freq: Four times a day (QID) | ORAL | Status: DC | PRN
  Filled 2013-08-28: qty 1

## 2013-08-28 MED ORDER — SODIUM CHLORIDE 0.9 % IV SOLN
INTRAVENOUS | Status: AC
  Administered 2013-08-28: 125 mL via INTRAVENOUS
  Administered 2013-08-29 (×2): via INTRAVENOUS

## 2013-08-28 MED ORDER — ONDANSETRON HCL 4 MG/2ML IJ SOLN
4.0000 mg | Freq: Once | INTRAMUSCULAR | Status: AC
  Administered 2013-08-28: 4 mg via INTRAVENOUS
  Filled 2013-08-28: qty 2

## 2013-08-28 MED ORDER — HYDROCODONE-ACETAMINOPHEN 5-325 MG PO TABS
1.0000 | ORAL_TABLET | ORAL | Status: DC | PRN
  Administered 2013-08-28 – 2013-08-31 (×8): 2 via ORAL
  Filled 2013-08-28 (×8): qty 2

## 2013-08-28 NOTE — Progress Notes (Signed)
   CARE MANAGEMENT ED NOTE 08/28/2013  Patient:  Frank Ward, Frank Ward   Account Number:  [id number]  Date Initiated:  08/28/2013  Documentation initiated by:  Livia Snellen  Subjective/Objective Assessment:   Patient presents to Ed with pain in neck and trapezius with dizziness and feeling of fullness in the head. Dehydration.     Subjective/Objective Assessment Detail:   Patient with pmhx of throid cancer with lung mets.  Has had radiation in the past.  MRI showing new lesion on brain and skull.     Action/Plan:   Oncology consult, neurosurgery consult.  IV fluids at 125cc's per hour.   Action/Plan Detail:   Anticipated DC Date:       Status Recommendation to Physician:   Result of Recommendation:    Other ED [place]  PCP issues    Choice offered to / List presented to:            Status of service:  Completed, signed off  ED Comments:   ED Comments Detail:  Patient confirms he does not have a pcp.  EDCM providedpatient with a list of pcp's who accept Medicaid insurance in [place].  EDCM expressed importance of finding a pcp.  Patient thankful for resources.

## 2013-08-28 NOTE — Progress Notes (Signed)
Utilization Review completed.  Lajeana Strough RN CM  

## 2013-08-28 NOTE — ED Notes (Signed)
EKG given to EDP, James, MD. For review. 

## 2013-08-28 NOTE — ED Notes (Signed)
Pt in MRI.

## 2013-08-28 NOTE — H&P (Addendum)
[facility]                                                                                    Patient Demographics  Frank Ward, is a 43 y.o. male  MRN: [id number]   DOB - 12/16/1970  Admit Date - 08/28/2013  Outpatient Primary MD for the patient is No primary provider on file.   With History of -  Past Medical History  Diagnosis Date  . Cancer     Thyroid  . Thyroid disease   . Hypertension       Past Surgical History  Procedure Laterality Date  . Tendon repair    . Thyroidectomy      in for   Chief Complaint  Patient presents with  . Neck Pain     HPI  Frank Ward  is a 43 y.o. male, was diagnosed with thyroid cancer status post partial resection, radiation treatments all about last year at [facility], with no other medical problems, he was told that his thyroid cancer was metastatic to lungs, and lower spine, few weeks ago he had some neck discomfort and went to [facility] where he was diagnosed with C-spine metastases, he now comes in to [facility] long hospital with mild constant neck pain radiating to the right side of his trapezius, also has been getting dizzy with sensation of head fullness, came to the ER where his workup through MRI of brain and C-spine was suggestive of multiple brain, skull, C3-C4 spine metastases.  His case was discussed in detail by the ER physician by oncologist on call Dr. Juliann Mule and neurosurgeon on call Dr.Nodleman both of which suggested that patient be admitted by hospitalist and they will see the patient in the morning. Neurosurgery did not advise any immobilization as they thought this process was more chronic than acute.  He should himself denies any upper extremity weakness, no focal weakness whatsoever, no problems breathing. No shortness of breath. Denies any palpitations or chest pain. No other subjective complaints, no generalized weight loss or malaise.    Review of Systems    In addition to the HPI  above, No Fever-chills, No Headache, No changes with Vision or hearing, No problems swallowing food or Liquids, No Chest pain, Cough or Shortness of Breath, No Abdominal pain, No Nausea or Vommitting, Bowel movements are regular, No Blood in stool or Urine, No dysuria, No new skin rashes or bruises, No new joints pains-aches, except neck and right shoulder pain No new weakness, tingling, numbness in any extremity, No recent weight gain or loss, No polyuria, polydypsia or polyphagia, No significant Mental Stressors.  A full 10 point Review of Systems was done, except as stated above, all other Review of Systems were negative.   Social History History  Substance Use Topics  . Smoking status: Former Research scientist (life sciences)  . Smokeless tobacco: Not on file  . Alcohol Use: 4.8 oz/week    8 Cans of beer per week     Family History Mother had lung cancer  Prior to Admission medications   Medication Sig Start Date End Date Taking? Authorizing Provider  Acetaminophen-Pamabrom 500-25 MG TABS Take 1 tablet by  mouth 2 (two) times daily as needed (pain).   Yes Historical Provider, MD  bisacodyl (DULCOLAX) 5 MG EC tablet Take 5 mg by mouth daily.    Yes Historical Provider, MD  Calcium Carb-Cholecalciferol (CALCIUM 500 +D) 500-400 MG-UNIT TABS Take 2 tablets by mouth 2 (two) times daily.   Yes Historical Provider, MD  levothyroxine (SYNTHROID, LEVOTHROID) 100 MCG tablet Take 100 mcg by mouth daily before breakfast.   Yes Historical Provider, MD  Menthol, Topical Analgesic, 5 % PADS Apply 1 patch topically daily as needed (pain).   Yes Historical Provider, MD  Multiple Vitamin (MULTIVITAMIN WITH MINERALS) TABS tablet Take 1 tablet by mouth daily.   Yes Historical Provider, MD  naproxen sodium (ANAPROX) 220 MG tablet Take 440 mg by mouth 2 (two) times daily.   Yes Historical Provider, MD  phenazopyridine (PYRIDIUM) 97 MG tablet Take 97 mg by mouth every 3 (three) days.   Yes Historical Provider, MD    No  Known Allergies  Physical Exam  Vitals  Blood pressure 137/91, pulse 83, temperature 97.6 F (36.4 C), temperature source Oral, resp. rate 16, SpO2 98.00%.   1. General young AA male lying in bed in NAD,     2. Normal affect and insight, Not Suicidal or Homicidal, Awake Alert, Oriented X 3.  3. No F.N deficits, ALL C.Nerves Intact, Strength 5/5 all 4 extremities, Sensation intact all 4 extremities, Plantars down going.  4. Ears and Eyes appear Normal, Conjunctivae clear, PERRLA. Moist Oral Mucosa.  5. Supple Neck, No JVD, No cervical lymphadenopathy appriciated, No Carotid Bruits. No C-spine tenderness  6. Symmetrical Chest wall movement, Good air movement bilaterally, CTAB.  7. RRR, No Gallops, Rubs or Murmurs, No Parasternal Heave.  8. Positive Bowel Sounds, Abdomen Soft, Non tender, No organomegaly appriciated,No rebound -guarding or rigidity.  9.  No Cyanosis, Normal Skin Turgor, No Skin Rash or Bruise.  10. Good muscle tone,  joints appear normal , no effusions, Normal ROM.  11. No Palpable Lymph Nodes in Neck or Axillae     Data Review  CBC  Recent Labs Lab 08/28/13 1524  WBC 4.3  HGB 12.8*  HCT 37.3*  PLT 293  MCV 94.7  MCH 32.5  MCHC 34.3  RDW 12.6  LYMPHSABS 0.9  MONOABS 0.6  EOSABS 0.0  BASOSABS 0.0   ------------------------------------------------------------------------------------------------------------------  Chemistries   Recent Labs Lab 08/28/13 1524  NA 139  K 3.9  CL 101  CO2 23  GLUCOSE 83  BUN 6  CREATININE 1.42*  CALCIUM 9.3   ------------------------------------------------------------------------------------------------------------------ CrCl is unknown because both a height and weight (above a minimum accepted value) are required for this calculation. ------------------------------------------------------------------------------------------------------------------ No results found for this basename: TSH, T4TOTAL,  FREET3, T3FREE, THYROIDAB,  in the last 72 hours   Coagulation profile  Recent Labs Lab 08/28/13 1524  INR 0.96   ------------------------------------------------------------------------------------------------------------------- No results found for this basename: DDIMER,  in the last 72 hours -------------------------------------------------------------------------------------------------------------------  Cardiac Enzymes No results found for this basename: CK, CKMB, TROPONINI, MYOGLOBIN,  in the last 168 hours ------------------------------------------------------------------------------------------------------------------ No components found with this basename: POCBNP,    ---------------------------------------------------------------------------------------------------------------  Urinalysis    Component Value Date/Time   COLORURINE AMBER* 07/26/2013 [place] 07/26/2013 1754   LABSPEC 1.018 07/26/2013 1754   Bier 7.0 07/26/2013 [place] 07/26/2013 [place] 07/26/2013 [place] 07/26/2013 [place] 07/26/2013 [place] 07/26/2013 1754  UROBILINOGEN 1.0 07/26/2013 1754   NITRITE POSITIVE* 07/26/2013 1754   LEUKOCYTESUR NEGATIVE 07/26/2013 1754    ----------------------------------------------------------------------------------------------------------------  Imaging results:   Mr Jeri Cos Wo Contrast  08/28/2013   CLINICAL DATA:  Dizziness and arm pain.  Thyroid cancer.  Headaches.  EXAM: MRI HEAD WITHOUT AND WITH CONTRAST  TECHNIQUE: Multiplanar, multiecho pulse sequences of the brain and surrounding structures were obtained without and with intravenous contrast.  CONTRAST:  66mL MULTIHANCE GADOBENATE DIMEGLUMINE 529 MG/ML IV SOLN  COMPARISON:  CT of the cervical spine 07/26/2013. MRI brain without and with contrast 02/05/2013.  FINDINGS: The previously seen fourth  ventricular lesion has significantly increased in size. An enhancing interventricular lesion now measures 23 x 18 x 16 mm. There is no associated hydrocephalus. The lesion within the right lateral ventricle is not significantly changed from the previous studies. This lesion is less clearly a metastatic deposit. There is a new 4 mm parenchymal lesion in the posterior right frontal lobe, likely lung primary motor cortex. No other enhancing parenchymal lesions are evident. Multiple enhancing skull lesions are present. There are no expansile lesions.  No acute infarct or hemorrhage is present. The ventricles are of normal size. No significant extra-axial fluid collection is present.  Flow is present in the major intracranial arteries. The globes and orbits are intact. Small polyps or mucous retention cysts are noted within the maxillary sinuses bilaterally. There is fluid in the nasal lacrimal duct bilaterally. No soft tissue abnormality is present in the duct. Mucosal thickening is noted in the right frontal sinus. There is some fluid in the mastoid air cells bilaterally. No obstructing nasopharyngeal lesion is evident.  IMPRESSION: 1. 23 mm enhancing mass lesion within the fourth ventricle is compatible with interval growth of a interventricular metastasis. 2. New enhancing 4 mm parenchymal lesion in the posterior right frontal lobe is also worrisome for metastasis. 3. Stable intraventricular lesion of the right lateral ventricle is not clearly metastatic deposit. This lesion has been stable at 7 mm over the course of exams. 4. 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 Critical Value/emergent results were called by telephone at the time of interpretation on 08/28/2013 at 5:43 PM to Dr. Tanna Furry , who verbally acknowledged these results.   Electronically Signed   By: Lawrence Santiago M.D.   On: 08/28/2013 17:43   Mr Cervical Spine W Wo Contrast  08/28/2013   CLINICAL DATA:  Dizziness and arm pain.   Thyroid cancer.  EXAM: MRI CERVICAL SPINE WITHOUT AND WITH CONTRAST  TECHNIQUE: Multiplanar and multiecho pulse sequences of the cervical spine, to include the craniocervical junction and cervicothoracic junction, were obtained according to standard protocol without and with intravenous contrast.  CONTRAST:  17 mL MultiHance  COMPARISON:  CT cervical spine 07/26/2013.  FINDINGS: Pathologic compression fractures are again seen at C3 and C4 with further collapse of both levels. There is significant extraosseous tumor at the C3 level extending into the ventral epidural space, right greater than left. Tumor extends into the right neural foramen at C2-3 and C3-4. There is tumor in the paraspinous space is well. Tumor extends into the posterior elements on the right at C3.  A 22 mm enhancing lesion is noted along the inferior left aspect of the T1 vertebral body. No other significant metastases are evident.  Normal signal is present in the cervical and upper thoracic spinal cord to the lowest imaged level, T2-3. The visualized soft tissues of the neck are otherwise unremarkable.  C2-3:  Tumor infiltration in the right neural foramen.  C3-4: Tumor infiltration is present in the right foramen and ventral epidural space.  C4-5: A leftward disc osteophyte complex is present without significant stenosis.  C5-6: A leftward disc osteophyte complex is present. Asymmetric left-sided uncovertebral spurring results in mild left foraminal stenosis.  C6-7: Mild facet hypertrophy is present. There is no significant stenosis.  C7-T1:  Negative.  IMPRESSION: 1. Progressive pathologic compression fractures at C3 and C4. 2. Extensive extraosseous tumor at the C3 level extends into the C2-3 and C3-4 foramina as well as the ventral epidural space on the right. 3. Extension of tumor into the posterior elements of C3 on the right. 4. 22 mm enhancing lesion along the inferior left aspect of the T1 vertebral body. 5. Mild left foraminal stenosis  at C5-6 secondary to a leftward disc osteophyte complex and asymmetric uncovertebral spurring. Critical Value/emergent results were called by telephone at the time of interpretation on 08/28/2013 at 5:42 PM to Dr. Tanna Furry , who verbally acknowledged these results.   Electronically Signed   By: Lawrence Santiago M.D.   On: 08/28/2013 17:42    EKG ordered    Assessment & Plan   1. History of thyroid cancer with known metastases to lungs, T-spine. No evidence of brain, skull, C3-C4 spine metastases with C-spine fracture. - We'll admit him to the hospital, both neurosurgery and oncology has been consulted by the ER physician as dictated in history of present illness and he will be seen by them in the morning. Neurosurgery has not recommended spine immobilization at this time. For now just supportive care with pain control and nausea control. Get records from [facility].   2. Dizziness. Most likely related to his brain metastases, will check carotid duplex, orthostatics, check baseline EKG and monitor on telemetry. Gentle IV fluids. Bed rest for now.    3. Acute renal insufficiency. Last creatinine is 1.3 about one month ago, will hydrate, get urine electrolytes. Repeat BMP in the morning.     DVT Prophylaxis Heparin   AM Labs Ordered, also please review Full Orders  Family Communication: Admission, patients condition and plan of care including tests being ordered have been discussed with the patient   who indicates understanding and agree with the plan and Code Status.  Code Status full  Likely DC to home  Condition GUARDED   Time spent in minutes 45    Lala Lund K M.D on 08/28/2013 at 6:55 PM  Between 7am to 7pm - Pager - [phone]  After 7pm go to [email or web address] - password [email or web address]  And look for the night coverage person covering me after hours  [facility] Office  [phone]

## 2013-08-28 NOTE — ED Provider Notes (Signed)
CSN: [id number]     Arrival date & time 08/28/13  1352 History   First MD Initiated Contact with Patient 08/28/13 1453     Chief Complaint  Patient presents with  . Neck Pain    HPI  Patient presents with a chief complaint right neck and shoulder pain. Significant past medical history for metastatic thyroid cancer. He states this was diagnosed "last summer". He treated element system. He had a biopsy of the T9 vertebra that showed malignancy. Hip skin showed multiple areas of spinal uptake and pulmonary metastases. He underwent resection of thyroid. He underwent I-131 treatment. He underwent abdominal and possibly chest radiation. He was being seen within oncologist to that system. He states they had "a miscommunication. He was lost to follow for 3 months. Seen and evaluated here 1212 of 2014. A CT of his neck and chest it showed a blood pressure at C3-C4 and multiple pulmonary metastases. Neurosurgical consultation was made. His rectum melena that is different treatment. He left AMA. He states that he was frightened. He wanted to spend Christmas with his family. He went to get his affairs in order. The last weeks of progressive shoulder pain. He has not had radicular symptoms. No difficulty breathing. Pain is right neck right shoulder. No fevers, no chills, no night sweats.  Past Medical History  Diagnosis Date  . Cancer     Thyroid  . Thyroid disease   . Hypertension    Past Surgical History  Procedure Laterality Date  . Tendon repair    . Thyroidectomy     No family history on file. History  Substance Use Topics  . Smoking status: Former Research scientist (life sciences)  . Smokeless tobacco: Not on file  . Alcohol Use: 4.8 oz/week    8 Cans of beer per week    Review of Systems  Constitutional: Negative for fever, chills, diaphoresis, appetite change and fatigue.  HENT: Negative for mouth sores, sore throat and trouble swallowing.   Eyes: Negative for visual disturbance.  Respiratory: Negative for  cough, chest tightness, shortness of breath and wheezing.   Cardiovascular: Negative for chest pain.  Gastrointestinal: Negative for nausea, vomiting, abdominal pain, diarrhea and abdominal distention.  Endocrine: Negative for polydipsia, polyphagia and polyuria.  Genitourinary: Negative for dysuria, frequency and hematuria.  Musculoskeletal: Positive for myalgias and neck pain. Negative for gait problem.  Skin: Negative for color change, pallor and rash.  Neurological: Positive for headaches. Negative for dizziness, syncope and light-headedness.  Hematological: Does not bruise/bleed easily.  Psychiatric/Behavioral: Negative for behavioral problems and confusion.    Allergies  Review of patient's allergies indicates no known allergies.  Home Medications   Current Outpatient Rx  Name  Route  Sig  Dispense  Refill  . Acetaminophen-Pamabrom 500-25 MG TABS   Oral   Take 1 tablet by mouth 2 (two) times daily as needed (pain).         . bisacodyl (DULCOLAX) 5 MG EC tablet   Oral   Take 5 mg by mouth daily.          . Calcium Carb-Cholecalciferol (CALCIUM 500 +D) 500-400 MG-UNIT TABS   Oral   Take 2 tablets by mouth 2 (two) times daily.         Marland Kitchen levothyroxine (SYNTHROID, LEVOTHROID) 100 MCG tablet   Oral   Take 100 mcg by mouth daily before breakfast.         . Menthol, Topical Analgesic, 5 % PADS   Apply externally   Apply 1  patch topically daily as needed (pain).         . Multiple Vitamin (MULTIVITAMIN WITH MINERALS) TABS tablet   Oral   Take 1 tablet by mouth daily.         . naproxen sodium (ANAPROX) 220 MG tablet   Oral   Take 440 mg by mouth 2 (two) times daily.         . phenazopyridine (PYRIDIUM) 97 MG tablet   Oral   Take 97 mg by mouth every 3 (three) days.          BP 137/91  Pulse 83  Temp(Src) 97.6 F (36.4 C) (Oral)  Resp 16  SpO2 98% Physical Exam  Constitutional: He is oriented to person, place, and time. He appears well-developed  and well-nourished. No distress.  Adult male awake alert.  HENT:  Head: Normocephalic.  Eyes: Conjunctivae are normal. Pupils are equal, round, and reactive to light. No scleral icterus.  Neck: Normal range of motion. Neck supple. No thyromegaly present.  Cardiovascular: Normal rate and regular rhythm.  Exam reveals no gallop and no friction rub.   No murmur heard. Pulmonary/Chest: Effort normal and breath sounds normal. No respiratory distress. He has no wheezes. He has no rales.  Abdominal: Soft. Bowel sounds are normal. He exhibits no distension. There is no tenderness. There is no rebound.  Musculoskeletal: Normal range of motion.  Has tenderness and pain to the posterior aspect of the right shoulder. Has a subcutaneous nodule that is palpable and tender in the posterior axillary fold. This seems to be within the musculature. He does not describe a positive Spurling sign. No Lhermitte's phenomenon.  Neurological: He is alert and oriented to person, place, and time. Tremors: .neuro.  Normal symmetric Strength to shoulder shrug, triceps, biceps, grip,wrist flex/extend,and intrinsics  Norma lsymmetric sensation above and below clavicles, and to all distributions to UEs. Norma symmetric strength to flex/.extend hip and knees, dorsi/plantar flex ankles. Normal symmetric sensation to all distributions to LEs Patellar and achilles reflexes 1-2+. Downgoing Babinski    Skin: Skin is warm and dry. No rash noted.  Psychiatric: He has a normal mood and affect. His behavior is normal.    ED Course  Procedures (including critical care time) Labs Review Labs Reviewed  CBC WITH DIFFERENTIAL - Abnormal; Notable for the following:    RBC 3.94 (*)    Hemoglobin 12.8 (*)    HCT 37.3 (*)    Monocytes Relative 13 (*)    All other components within normal limits  BASIC METABOLIC PANEL - Abnormal; Notable for the following:    Creatinine, Ser 1.42 (*)    GFR calc non Af Amer 59 (*)    GFR calc Af  Amer 68 (*)    All other components within normal limits  PROTIME-INR   Imaging Review Mr Jeri Cos Wo Contrast  08/28/2013   CLINICAL DATA:  Dizziness and arm pain.  Thyroid cancer.  Headaches.  EXAM: MRI HEAD WITHOUT AND WITH CONTRAST  TECHNIQUE: Multiplanar, multiecho pulse sequences of the brain and surrounding structures were obtained without and with intravenous contrast.  CONTRAST:  23mL MULTIHANCE GADOBENATE DIMEGLUMINE 529 MG/ML IV SOLN  COMPARISON:  CT of the cervical spine 07/26/2013. MRI brain without and with contrast 02/05/2013.  FINDINGS: The previously seen fourth ventricular lesion has significantly increased in size. An enhancing interventricular lesion now measures 23 x 18 x 16 mm. There is no associated hydrocephalus. The lesion within the right lateral ventricle is not significantly  changed from the previous studies. This lesion is less clearly a metastatic deposit. There is a new 4 mm parenchymal lesion in the posterior right frontal lobe, likely lung primary motor cortex. No other enhancing parenchymal lesions are evident. Multiple enhancing skull lesions are present. There are no expansile lesions.  No acute infarct or hemorrhage is present. The ventricles are of normal size. No significant extra-axial fluid collection is present.  Flow is present in the major intracranial arteries. The globes and orbits are intact. Small polyps or mucous retention cysts are noted within the maxillary sinuses bilaterally. There is fluid in the nasal lacrimal duct bilaterally. No soft tissue abnormality is present in the duct. Mucosal thickening is noted in the right frontal sinus. There is some fluid in the mastoid air cells bilaterally. No obstructing nasopharyngeal lesion is evident.  IMPRESSION: 1. 23 mm enhancing mass lesion within the fourth ventricle is compatible with interval growth of a interventricular metastasis. 2. New enhancing 4 mm parenchymal lesion in the posterior right frontal lobe is  also worrisome for metastasis. 3. Stable intraventricular lesion of the right lateral ventricle is not clearly metastatic deposit. This lesion has been stable at 7 mm over the course of exams. 4. 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 Critical Value/emergent results were called by telephone at the time of interpretation on 08/28/2013 at 5:43 PM to Dr. Tanna Furry , who verbally acknowledged these results.   Electronically Signed   By: Lawrence Santiago M.D.   On: 08/28/2013 17:43   Mr Cervical Spine W Wo Contrast  08/28/2013   CLINICAL DATA:  Dizziness and arm pain.  Thyroid cancer.  EXAM: MRI CERVICAL SPINE WITHOUT AND WITH CONTRAST  TECHNIQUE: Multiplanar and multiecho pulse sequences of the cervical spine, to include the craniocervical junction and cervicothoracic junction, were obtained according to standard protocol without and with intravenous contrast.  CONTRAST:  17 mL MultiHance  COMPARISON:  CT cervical spine 07/26/2013.  FINDINGS: Pathologic compression fractures are again seen at C3 and C4 with further collapse of both levels. There is significant extraosseous tumor at the C3 level extending into the ventral epidural space, right greater than left. Tumor extends into the right neural foramen at C2-3 and C3-4. There is tumor in the paraspinous space is well. Tumor extends into the posterior elements on the right at C3.  A 22 mm enhancing lesion is noted along the inferior left aspect of the T1 vertebral body. No other significant metastases are evident.  Normal signal is present in the cervical and upper thoracic spinal cord to the lowest imaged level, T2-3. The visualized soft tissues of the neck are otherwise unremarkable.  C2-3:  Tumor infiltration in the right neural foramen.  C3-4: Tumor infiltration is present in the right foramen and ventral epidural space.  C4-5: A leftward disc osteophyte complex is present without significant stenosis.  C5-6: A leftward disc osteophyte complex  is present. Asymmetric left-sided uncovertebral spurring results in mild left foraminal stenosis.  C6-7: Mild facet hypertrophy is present. There is no significant stenosis.  C7-T1:  Negative.  IMPRESSION: 1. Progressive pathologic compression fractures at C3 and C4. 2. Extensive extraosseous tumor at the C3 level extends into the C2-3 and C3-4 foramina as well as the ventral epidural space on the right. 3. Extension of tumor into the posterior elements of C3 on the right. 4. 22 mm enhancing lesion along the inferior left aspect of the T1 vertebral body. 5. Mild left foraminal stenosis at C5-6 secondary  to a leftward disc osteophyte complex and asymmetric uncovertebral spurring. Critical Value/emergent results were called by telephone at the time of interpretation on 08/28/2013 at 5:42 PM to Dr. Tanna Furry , who verbally acknowledged these results.   Electronically Signed   By: Lawrence Santiago M.D.   On: 08/28/2013 17:42    EKG Interpretation   None       MDM   1. Hypertension   2. Cancer   3. Cervical spine fracture   4. Thyroid disease    Clinically this patient has no signs of acute myelopathy. No signs of acute cord compression. No symptoms or objective findings that suggest acute radiculopathy. His MRI has shown progression of invasion of his tumor into the C3 and C4 body. I discussed case with Dr. Lorelle Formosa oncology, Dr.  Sherwood Gambler of neurosurgery and with [facility].  Patient had been seen by Dr. Ellene Route, Dr. Donnella Bi partner in December area patient at that time did leave AMA. I presented this patient to each of the above physicians as a patient with right shoulder pain and abnormal CT scan and MRI suggestive of metastatic disease. I described as having no sign of acute cord compromise, myelopathy, or radiculopathy. Current thinking at this time is that initial plan of care may likely need to be radiation therapy. He will be seen in consultation by neurosurgery. He will be seen in  consultation by oncology.    Tanna Furry, MD 08/28/13 [phone]

## 2013-08-28 NOTE — ED Notes (Signed)
Pt c/o right shoulder pain; right sided neck pain; states when stands feels like head going to explode; dizzy; states was evaluated 3 wks ago at [facility] and told was an infection that required surgery--pt states he had some things to get straight before he could have it further addressed

## 2013-08-29 DIAGNOSIS — C7931 Secondary malignant neoplasm of brain: Principal | ICD-10-CM

## 2013-08-29 DIAGNOSIS — M25519 Pain in unspecified shoulder: Secondary | ICD-10-CM

## 2013-08-29 DIAGNOSIS — C7952 Secondary malignant neoplasm of bone marrow: Secondary | ICD-10-CM

## 2013-08-29 DIAGNOSIS — C7951 Secondary malignant neoplasm of bone: Secondary | ICD-10-CM | POA: Diagnosis present

## 2013-08-29 DIAGNOSIS — C7949 Secondary malignant neoplasm of other parts of nervous system: Principal | ICD-10-CM

## 2013-08-29 DIAGNOSIS — M542 Cervicalgia: Secondary | ICD-10-CM

## 2013-08-29 DIAGNOSIS — C7800 Secondary malignant neoplasm of unspecified lung: Secondary | ICD-10-CM

## 2013-08-29 DIAGNOSIS — C73 Malignant neoplasm of thyroid gland: Secondary | ICD-10-CM | POA: Diagnosis present

## 2013-08-29 LAB — BASIC METABOLIC PANEL
BUN: 5 mg/dL — ABNORMAL LOW (ref 6–23)
CALCIUM: 8.3 mg/dL — AB (ref 8.4–10.5)
CO2: 26 meq/L (ref 19–32)
Chloride: 103 mEq/L (ref 96–112)
Creatinine, Ser: 1.35 mg/dL (ref 0.50–1.35)
GFR calc Af Amer: 72 mL/min — ABNORMAL LOW (ref >90)
GFR, EST NON AFRICAN AMERICAN: 62 mL/min — AB (ref >90)
GLUCOSE: 79 mg/dL (ref 70–99)
POTASSIUM: 3.7 meq/L (ref 3.7–5.3)
SODIUM: 139 meq/L (ref 137–147)

## 2013-08-29 LAB — CBC
HCT: 36 % — ABNORMAL LOW (ref 39.0–52.0)
HEMOGLOBIN: 12 g/dL — AB (ref 13.0–17.0)
MCH: 32.1 pg (ref 26.0–34.0)
MCHC: 33.3 g/dL (ref 30.0–36.0)
MCV: 96.3 fL (ref 78.0–100.0)
PLATELETS: 275 10*3/uL (ref 150–400)
RBC: 3.74 MIL/uL — ABNORMAL LOW (ref 4.22–5.81)
RDW: 12.6 % (ref 11.5–15.5)
WBC: 3.4 10*3/uL — AB (ref 4.0–10.5)

## 2013-08-29 LAB — OSMOLALITY: OSMOLALITY: 278 mosm/kg (ref 275–300)

## 2013-08-29 LAB — SODIUM, URINE, RANDOM: SODIUM UR: 29 meq/L

## 2013-08-29 LAB — CREATININE, URINE, RANDOM: CREATININE, URINE: 103.8 mg/dL

## 2013-08-29 LAB — OSMOLALITY, URINE: OSMOLALITY UR: 188 mosm/kg — AB (ref 390–1090)

## 2013-08-29 NOTE — Progress Notes (Signed)
[facility] PROGRESS NOTE  Frank Ward ZOX:[id number] DOB: 12-22-1970 DOA: 08/28/2013 PCP: No primary provider on file. 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 As per his oncologist in [place] Dr. Celesta Aver, patient fired him from his care in August 2014 and has not followed with him since then. Patient saw Dr. Vallery Ridge for palliative care briefly during that time but again lost followup. He presented to [facility] in July 2014 for right neck and shoulder pain. He had a CT of his neck and chest which showed pathological fractures of C3 and C4 vertebrae with metastases to the cervical spine. He was evaluated by neurosurgery and recommended for an MRI of the spine but he signed out AMA from the ED. He returned to [facility] long ED on 1/14 with constant pain over the neck radiating to the right side and some dizziness. MRI of the brain and C-spine showed multiple metastases to the brain, progressive pathological compression fracture of C3 and C4 with extensive extraosseous tumor spread.   Assessment/Plan: Aggressive thyroid cancer with widespread metastases Patient noted for long and T-spine metastases previously and now has metastases to the cervical spine and brain. Detailed MRI results as outlined below. As per his previous oncologist in [place] patient did not continue further care since August of 2014. Neurosurgery and oncology have been consulted by ED and will follow up with the recommendations. -Continue supportive care for now. IV hydration, pain control and antiemetics -No residual weakness on exam. -Continue  Synthroid. -will discuss further with oncology  Dizziness Likely secondary to his brain metastases. Carotid Doppler without significant stenosis. monitor on telemetry   Acute kidney injury Improved with hydration. We'll continue IV fluids   Code Status: Full code Family Communication: None at bedside Disposition Plan: Home once treatment options are discussed   Consultants:  Pending oncology and neurosurgery consult  Procedures:  None  Antibiotics:  None  HPI/Subjective: This continue and examined this morning. Complains of pain over his right neck and shoulder  Objective: Filed Vitals:   08/29/13 0655  BP: 126/84  Pulse: 72  Temp: 97.6 F (36.4 C)  Resp: 20    Intake/Output Summary (Last 24 hours) at 08/29/13 1154 Last data filed at 08/29/13 0740  Gross per 24 hour  Intake 1506.67 ml  Output      0 ml  Net 1506.67 ml   Filed Weights   08/28/13 2028 08/29/13 0655  Weight: 81.103 kg (178 lb 12.8 oz) 81.043 kg (178 lb 10.7 oz)    Exam:   General:  Middle aged male in no acute distress  HEENT: No pallor, moist oral mucosa  Chest: Clear" bilaterally, no added sound  CVS: Normal S1-S2, no murmurs rub or gallop  Abdomen: Soft, nontender, nondistended, bowel sounds present  Extremities: Warm, no edema, tender to palpation over right trapezius and right posterior shoulder joint  CNS: AAO x3, nonfocal    Data Reviewed: Basic Metabolic Panel:  Recent Labs Lab 08/28/13 1524 08/28/13 2101 08/29/13 0530  NA 139  --  139  K 3.9  --  3.7  CL 101  --  103  CO2 23  --  26  GLUCOSE 83  --  79  BUN 6  --  5*  CREATININE 1.42* 1.30 1.35  CALCIUM 9.3  --  8.3*   Liver Function Tests: No results found for this basename: AST, ALT, ALKPHOS, BILITOT, PROT, ALBUMIN,  in the last 168 hours No results found for this basename: LIPASE, AMYLASE,  in the last 168 hours No results found for this basename: AMMONIA,  in the last 168 hours CBC:  Recent  Labs Lab 2013-09-21 1524 2013-09-21 2101 08/29/13 0530  WBC 4.3 4.0 3.4*  NEUTROABS 2.7  --   --   HGB 12.8* 12.9* 12.0*  HCT 37.3* 37.7* 36.0*  MCV 94.7 95.4 96.3  PLT 293 310 275   Cardiac Enzymes: No results found for this basename: CKTOTAL, CKMB, CKMBINDEX, TROPONINI,  in the last 168 hours BNP (last 3 results) No results found for this basename: PROBNP,  in the last 8760 hours CBG: No results found for this basename: GLUCAP,  in the last 168 hours  No results found for this or any previous visit (from the past 240 hour(s)).   Studies: Mr Lodema Pilot Contrast  09/21/13   CLINICAL DATA:  Dizziness and arm pain.  Thyroid cancer.  Headaches.  EXAM: MRI HEAD WITHOUT AND WITH CONTRAST  TECHNIQUE: Multiplanar, multiecho pulse sequences of the brain and surrounding structures were obtained without and with intravenous contrast.  CONTRAST:  17mL MULTIHANCE GADOBENATE DIMEGLUMINE 529 MG/ML IV SOLN  COMPARISON:  CT of the cervical spine 07/26/2013. MRI brain without and with contrast 02/05/2013.  FINDINGS: The previously seen fourth ventricular lesion has significantly increased in size. An enhancing interventricular lesion now measures 23 x 18 x 16 mm. There is no associated hydrocephalus. The lesion within the right lateral ventricle is not significantly changed from the previous studies. This lesion is less clearly a metastatic deposit. There is a new 4 mm parenchymal lesion in the posterior right frontal lobe, likely lung primary motor cortex. No other enhancing parenchymal lesions are evident. Multiple enhancing skull lesions are present. There are no expansile lesions.  No acute infarct or hemorrhage is present. The ventricles are of normal size. No significant extra-axial fluid collection is present.  Flow is present in the major intracranial arteries. The globes and orbits are intact. Small polyps or mucous retention cysts are noted within the maxillary sinuses bilaterally. There is fluid in  the nasal lacrimal duct bilaterally. No soft tissue abnormality is present in the duct. Mucosal thickening is noted in the right frontal sinus. There is some fluid in the mastoid air cells bilaterally. No obstructing nasopharyngeal lesion is evident.  IMPRESSION: 1. 23 mm enhancing mass lesion within the fourth ventricle is compatible with interval growth of a interventricular metastasis. 2. New enhancing 4 mm parenchymal lesion in the posterior right frontal lobe is also worrisome for metastasis. 3. Stable intraventricular lesion of the right lateral ventricle is not clearly metastatic deposit. This lesion has been stable at 7 mm over the course of exams. 4. 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 Critical Value/emergent results were called by telephone at the time of interpretation on Sep 21, 2013 at 5:43 PM to Dr. Rolland Porter , who verbally acknowledged these results.   Electronically Signed   By: Gennette Pac M.D.   On: 21-Sep-2013 17:43   Mr Cervical Spine W Wo Contrast  09/21/13   CLINICAL DATA:  Dizziness and arm pain.  Thyroid cancer.  EXAM: MRI CERVICAL SPINE WITHOUT AND WITH CONTRAST  TECHNIQUE: Multiplanar  and multiecho pulse sequences of the cervical spine, to include the craniocervical junction and cervicothoracic junction, were obtained according to standard protocol without and with intravenous contrast.  CONTRAST:  17 mL MultiHance  COMPARISON:  CT cervical spine 07/26/2013.  FINDINGS: Pathologic compression fractures are again seen at C3 and C4 with further collapse of both levels. There is significant extraosseous tumor at the C3 level extending into the ventral epidural space, right greater than left. Tumor extends into the right neural foramen at C2-3 and C3-4. There is tumor in the paraspinous space is well. Tumor extends into the posterior elements on the right at C3.  A 22 mm enhancing lesion is noted along the inferior left aspect of the T1 vertebral body. No other  significant metastases are evident.  Normal signal is present in the cervical and upper thoracic spinal cord to the lowest imaged level, T2-3. The visualized soft tissues of the neck are otherwise unremarkable.  C2-3:  Tumor infiltration in the right neural foramen.  C3-4: Tumor infiltration is present in the right foramen and ventral epidural space.  C4-5: A leftward disc osteophyte complex is present without significant stenosis.  C5-6: A leftward disc osteophyte complex is present. Asymmetric left-sided uncovertebral spurring results in mild left foraminal stenosis.  C6-7: Mild facet hypertrophy is present. There is no significant stenosis.  C7-T1:  Negative.  IMPRESSION: 1. Progressive pathologic compression fractures at C3 and C4. 2. Extensive extraosseous tumor at the C3 level extends into the C2-3 and C3-4 foramina as well as the ventral epidural space on the right. 3. Extension of tumor into the posterior elements of C3 on the right. 4. 22 mm enhancing lesion along the inferior left aspect of the T1 vertebral body. 5. Mild left foraminal stenosis at C5-6 secondary to a leftward disc osteophyte complex and asymmetric uncovertebral spurring. Critical Value/emergent results were called by telephone at the time of interpretation on 08/28/2013 at 5:42 PM to Dr. Tanna Furry , who verbally acknowledged these results.   Electronically Signed   By: Lawrence Santiago M.D.   On: 08/28/2013 17:42    Scheduled Meds: . bisacodyl  5 mg Oral Daily  . heparin  5,000 Units Subcutaneous Q8H  . influenza vac split quadrivalent PF  0.5 mL Intramuscular Tomorrow-1000  . levothyroxine  100 mcg Oral QAC breakfast  . multivitamin with minerals  1 tablet Oral Daily   Continuous Infusions: . sodium chloride 125 mL/hr at 08/29/13 0647      Time spent: 25 minutes    Hasan Douse, [place]  [facility] Pager [phone]. If 7PM-7AM, please contact night-coverage at [email or web address], password [facility] 08/29/2013, 11:54 AM  LOS: 1  day

## 2013-08-29 NOTE — Consult Note (Signed)
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 male with a history of metastatic thyroid cancer diagnosed in July 2014 with findings on paraspinal biopsy. His medical oncology care has been rendered by Dr. Gerarda Fraction of [facility] whom I spoke to personally and reviewed his records.   Dr. Orlie Dakin states that patient was lost to followup over the past 5-6 months. He presented to Dr. Orlie Dakin for evaluation of thyroid mass, multiple lung and bone lesions suspicious for metastatic disease on Jan 02, 2013.  This was a follow up appointment for an emergency room visit for acute onset backpain several weeks prior.  During this time the patient was homeless.  CT of Head on December 10, 2012 demonstrated no acute intracranial processes but CT Chest, Abd and Pelvis on the same date showed innumerable bilateral pulmonary nodules of varying sizes with an associated 3 x 3.5 cm soft tissue mass with associated bone destruction of the right ilium and a heterogeneous enlargement of the right thyroid gland with right inferior extension and leftward deviation of the trachea concerning malignancy. He had a PET scan (01/03/13) showed widespread foci of abnormal uptake consistent with metastatic disease and a dominant mass in the right thyroid lobe suspicious for thyroid malignancy with multiple bony metastases and a large T9 destructive lesion likely invading the spinal canal with significant spinal lesions at C3 and C4 and T1.  The lesions are T12 and L1 were lytic and hypermetabolic.  There were also lesions in the right sacral and adjacent iliac bone and more anterior in the right iliac bone.   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 He had a total thyroidectomy on 01/29/2013 by Dr. Davina Poke.  CT scan of the head (02/05/2013) showed 2 lesions--an abnormal hyperdense focus in the posterior aspect of there right lateral ventricle measuring 10 x 12 mm and a 5 mm diameter hyperdense nodule in the fourth ventricle-- in the ventricle occupying but patient was asymptomatic. He underwent radiation to his T-9 spine based on significant pain and marked bony destruction with possible cord impingement.  He had a total of 300 cGY/Fx in 10 fractions from 01/30/2013 to 02/12/2013 by Dr. Carmina Miller. [facility].  He was on fentanyl patch 100 mcg q 3 days, hydormorphone 4 mg q 4 hours prn, dexamethasone 4 mg bid and gabapentin 600 mg tid.  His fentanyl patch was increased to 150 mcg q 3 days.   He followed up with his medical oncologists in July 20 with his TSH greater than 35, ready to proceed with radioactive iodide ablation.  Because of his social situation, he was required to be admitted to the hospital for treatment.  His plan for treatment was radioactive iodide and if that did not work to consider sutent or other oral chemotherapies.  He was scheduled for neurosurgery evaluation on July 29th, 2014.  Treatment for his pain was managed by Dr. Harriett Sine Phifer of Palliative care.   He underwent radioactive iodide ablation on 05/02/2013 receiving a dose of 1.76 mCi of iodine 131.   However given the bulky widespread metastatic disease which was I-131 avid and  the large metastatic tumor size in multiple foci the likelihood of these being completely treated with I-131 was felt to be low and close follow-up was recommended.  He was subsequently lost to follow-up.    Notable labs in May, 2014 included CA 19-9 of 43 (0-35 U/mL) and CEA of 1.4 (0.0 - 4.7 ng/mL), PSA of 1.7, AFP of 3.7, serum calcitonin  less than 2.0 pg/mL, THY antithyroglobulin AB less than 1.0 IU/mL and a negative beta-HCG tumor marker.  TSH on 03/01/2013 was 42.3 and repeated on 03/25/2013 and found to be 69.8.    He reports that he moved here from [place] about 2 months ago.  He now lives with his mother. He presented to [facility] ED in July for right neck and shoulder pain.  A CT of his neck and chest showed pathological fractures of c3 and C4 vertebrae with metastases to the cervical spine.  He was evaluated by neurosurgery and recommended for an MRI of the spine but patient signed out AMA from the ED.  He presented to [facility] ED on 1/14 due to right neck pain radiating down the shoulder and dizziness.  MRI of the brain and C-spine showed multiple metastases to the brain, progressive pathological compression fracture of C3 and C4 with extensive extraosseous tumor spread.   Today, He also notes continued pain in his right shoulder and neck.  Last week, he reports diplopia and headaches.  He reports that these symptoms are new.  He denies nauseas and vomiting.  We are being consulted for further recommendations regarding his care for metastatic thyroid cancer.    HPI  Past Medical History  Diagnosis Date  . Cancer     Thyroid  . Thyroid disease   . Hypertension     Past Surgical History  Procedure Laterality Date  . Tendon repair    . Thyroidectomy      History reviewed. No pertinent family history.  Social History History  Substance Use Topics  . Smoking status: Former Smoker -- 10 years    Types: Cigarettes    Quit date: 08/29/2011  . Smokeless tobacco: Never Used  . Alcohol Use: 4.8 oz/week    8 Cans of beer per week     Comment: stopped in approx Dec. 2013    No Known Allergies  Current Facility-Administered Medications  Medication Dose Route Frequency Provider Last Rate Last Dose  . 0.9 %  sodium chloride infusion   Intravenous Continuous Thurnell Lose, MD 125 mL/hr at 08/29/13 [phone]    .  bisacodyl (DULCOLAX) EC tablet 5 mg  5 mg Oral Daily Thurnell Lose, MD   5 mg at 08/29/13 1038  . guaiFENesin-dextromethorphan (ROBITUSSIN DM) 100-10 MG/5ML syrup 5 mL  5 mL Oral Q4H PRN Thurnell Lose, MD      . heparin injection 5,000 Units  5,000 Units Subcutaneous Q8H Thurnell Lose, MD   5,000 Units at 08/29/13 [phone]  . HYDROcodone-acetaminophen (NORCO/VICODIN) 5-325 MG per tablet 1-2 tablet  1-2 tablet Oral Q4H PRN Thurnell Lose, MD   2 tablet at 08/29/13 1227  . influenza vac split quadrivalent PF (FLUARIX) injection 0.5 mL  0.5 mL Intramuscular Tomorrow-1000 Thurnell Lose, MD      . levothyroxine (SYNTHROID, LEVOTHROID) tablet 100 mcg  100 mcg Oral QAC breakfast Thurnell Lose, MD   100 mcg at 08/29/13 0739  . morphine 2 MG/ML injection 2 mg  2 mg Intravenous Q4H PRN Thurnell Lose, MD  2 mg at 08/29/13 1043  . morphine 4 MG/ML injection 4 mg  4 mg Intravenous Q1H PRN Tanna Furry, MD   4 mg at 2013/08/29 1930  . multivitamin with minerals tablet 1 tablet  1 tablet Oral Daily Thurnell Lose, MD   1 tablet at 08/29/13 1038  . ondansetron (ZOFRAN) tablet 4 mg  4 mg Oral Q6H PRN Thurnell Lose, MD       Or  . ondansetron (ZOFRAN) injection 4 mg  4 mg Intravenous Q6H PRN Thurnell Lose, MD      . polyethylene glycol (MIRALAX / GLYCOLAX) packet 17 g  17 g Oral Daily PRN Thurnell Lose, MD        Review of Systems  A comprehensive review of systems was negative except for: Eyes: positive for diplopia Musculoskeletal: positive for back pain, muscle weakness and neck pain Neurological: positive for dizziness and memory problems Gastro: He endorses changes in stool caliber.    Physical Exam  [id number] nourished, well developed, anxious and mild distress SKIN: skin color, texture, turgor are normal, no rashes or significant lesions HEAD: Normocephalic, TTP in posterior occiput area EYES: PERRLA, EOMI EARS: External ears normal OROPHARYNX:no exudate, no erythema and  dentition normal  NECK: supple, thyroidectomy scar well healed LYMPH:  no palpable lymphadenopathy in the cervical area LUNGS: clear to auscultation, no wheezes or rhonchi HEART: regular rate & rhythm and no murmurs ABDOMEN:abdomen soft, non-tender and normal bowel sounds BACK: Range of motion is normal, Tender to palpation or Tspine mild EXTREMITIES:no edema, no skin discoloration  NEURO: alert & oriented x 3 with fluent speech, no focal motor/sensory deficits, Good finger to nose.  PERFORMANCE STATUS: ECOG 0/1  LABORATORY DATA: Lab Results  Component Value Date   WBC 3.4* 08/29/2013   HGB 12.0* 08/29/2013   HCT 36.0* 08/29/2013   MCV 96.3 08/29/2013   PLT 275 08/29/2013    RADIOGRAPHIC STUDIES: Mr Jeri Cos Wo Contrast  2013-08-29   CLINICAL DATA:  Dizziness and arm pain.  Thyroid cancer.  Headaches.  EXAM: MRI HEAD WITHOUT AND WITH CONTRAST  TECHNIQUE: Multiplanar, multiecho pulse sequences of the brain and surrounding structures were obtained without and with intravenous contrast.  CONTRAST:  65mL MULTIHANCE GADOBENATE DIMEGLUMINE 529 MG/ML IV SOLN  COMPARISON:  CT of the cervical spine 07/26/2013. MRI brain without and with contrast 02/05/2013.  FINDINGS: The previously seen fourth ventricular lesion has significantly increased in size. An enhancing interventricular lesion now measures 23 x 18 x 16 mm. There is no associated hydrocephalus. The lesion within the right lateral ventricle is not significantly changed from the previous studies. This lesion is less clearly a metastatic deposit. There is a new 4 mm parenchymal lesion in the posterior right frontal lobe, likely lung primary motor cortex. No other enhancing parenchymal lesions are evident. Multiple enhancing skull lesions are present. There are no expansile lesions.  No acute infarct or hemorrhage is present. The ventricles are of normal size. No significant extra-axial fluid collection is present.  Flow is present in the major  intracranial arteries. The globes and orbits are intact. Small polyps or mucous retention cysts are noted within the maxillary sinuses bilaterally. There is fluid in the nasal lacrimal duct bilaterally. No soft tissue abnormality is present in the duct. Mucosal thickening is noted in the right frontal sinus. There is some fluid in the mastoid air cells bilaterally. No obstructing nasopharyngeal lesion is evident.  IMPRESSION: 1. 23 mm enhancing mass lesion within the  fourth ventricle is compatible with interval growth of a interventricular metastasis. 2. New enhancing 4 mm parenchymal lesion in the posterior right frontal lobe is also worrisome for metastasis. 3. Stable intraventricular lesion of the right lateral ventricle is not clearly metastatic deposit. This lesion has been stable at 7 mm over the course of exams. 4. 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 Critical Value/emergent results were called by telephone at the time of interpretation on 08/28/2013 at 5:43 PM to Dr. Tanna Furry , who verbally acknowledged these results.   Electronically Signed   By: Lawrence Santiago M.D.   On: 08/28/2013 17:43   Mr Cervical Spine W Wo Contrast  08/28/2013   CLINICAL DATA:  Dizziness and arm pain.  Thyroid cancer.  EXAM: MRI CERVICAL SPINE WITHOUT AND WITH CONTRAST  TECHNIQUE: Multiplanar and multiecho pulse sequences of the cervical spine, to include the craniocervical junction and cervicothoracic junction, were obtained according to standard protocol without and with intravenous contrast.  CONTRAST:  17 mL MultiHance  COMPARISON:  CT cervical spine 07/26/2013.  FINDINGS: Pathologic compression fractures are again seen at C3 and C4 with further collapse of both levels. There is significant extraosseous tumor at the C3 level extending into the ventral epidural space, right greater than left. Tumor extends into the right neural foramen at C2-3 and C3-4. There is tumor in the paraspinous space is well.  Tumor extends into the posterior elements on the right at C3.  A 22 mm enhancing lesion is noted along the inferior left aspect of the T1 vertebral body. No other significant metastases are evident.  Normal signal is present in the cervical and upper thoracic spinal cord to the lowest imaged level, T2-3. The visualized soft tissues of the neck are otherwise unremarkable.  C2-3:  Tumor infiltration in the right neural foramen.  C3-4: Tumor infiltration is present in the right foramen and ventral epidural space.  C4-5: A leftward disc osteophyte complex is present without significant stenosis.  C5-6: A leftward disc osteophyte complex is present. Asymmetric left-sided uncovertebral spurring results in mild left foraminal stenosis.  C6-7: Mild facet hypertrophy is present. There is no significant stenosis.  C7-T1:  Negative.  IMPRESSION: 1. Progressive pathologic compression fractures at C3 and C4. 2. Extensive extraosseous tumor at the C3 level extends into the C2-3 and C3-4 foramina as well as the ventral epidural space on the right. 3. Extension of tumor into the posterior elements of C3 on the right. 4. 22 mm enhancing lesion along the inferior left aspect of the T1 vertebral body. 5. Mild left foraminal stenosis at C5-6 secondary to a leftward disc osteophyte complex and asymmetric uncovertebral spurring. Critical Value/emergent results were called by telephone at the time of interpretation on 08/28/2013 at 5:42 PM to Dr. Tanna Furry , who verbally acknowledged these results.   Electronically Signed   By: Lawrence Santiago M.D.   On: 08/28/2013 17:42    ASSESSMENT: 43 yo with metastatic thyroid cancer, with an aggressive columnar variant, with mets to bone, lungs and brain admitted with dizziness, right shoulder pain.   He was lost to follow up from prior oncology, palliative pain management.   PLAN:   1. Metastatic thyroid cancer complicated by    A. Brain mets, progressive.              -- Consult  radiation oncology for consideration of      EBRT as the patient is symptomatic.  Not likely a  surgery candidate. Await  neurosurgery recs.   -- Continue decadron per primary team.   -- Evaluate for repeat Iodide ablation (last received  Over 90 days ago.)  Check TSH, Free T4. If not a  candidate, consider starting sutent.    -- Neuro checks q 4 hours                B. C3, C4 Lesion  --Might also consider focal XRT.  Prior XRT to  T9 with success in improvement of symptoms.    C. Right neck pain secondary to B.  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 He  was on Gabapentin 900 mg tid.  Consider lyrica.    Palliative care consult if persists and worsens.   The patient voices understanding of current disease status and treatment options and is in agreement with the current care plan.  All questions were answered. The patient knows to call the clinic with any problems, questions or concerns. We can certainly see the patient much sooner if necessary.  Thank you so much for allowing me to participate in the care of [place]. I will continue to follow up the patient with you and assist in his care.  I spent 40 minutes counseling the patient face to face. The total time spent in the appointment was 60 minutes.  Sherrick Araki 08/29/2013, 12:47 PM

## 2013-08-29 NOTE — Progress Notes (Signed)
*  PRELIMINARY RESULTS* Vascular Ultrasound Carotid Duplex (Doppler) has been completed.   Findings suggest 1-39% internal carotid artery stenosis bilaterally. Vertebral arteries are patent with antegrade flow.  08/29/2013 11:01 AM Maudry Mayhew, RVT, RDCS, RDMS

## 2013-08-30 ENCOUNTER — Ambulatory Visit
Admit: 2013-08-30 | Discharge: 2013-08-30 | Disposition: A | Discharge status: Home / Self Care | Payer: Medicaid Other | Attending: Radiation Oncology | Admitting: Radiation Oncology

## 2013-08-30 DIAGNOSIS — C7931 Secondary malignant neoplasm of brain: Secondary | ICD-10-CM | POA: Diagnosis present

## 2013-08-30 MED ORDER — DEXAMETHASONE 4 MG PO TABS
4.0000 mg | ORAL_TABLET | Freq: Four times a day (QID) | ORAL | Status: DC
  Administered 2013-08-30 – 2013-09-09 (×39): 4 mg via ORAL
  Filled 2013-08-30 (×44): qty 1

## 2013-08-30 MED ORDER — PANTOPRAZOLE SODIUM 40 MG PO TBEC
40.0000 mg | DELAYED_RELEASE_TABLET | Freq: Every day | ORAL | Status: DC
  Administered 2013-08-30 – 2013-09-09 (×11): 40 mg via ORAL
  Filled 2013-08-30 (×11): qty 1

## 2013-08-30 NOTE — Consult Note (Signed)
Radiation Oncology         [phone] ________________________________  Name: Frank Ward MRN: [id number]  Date: 08/28/2013  DOB: Nov 05, 1970  INPATIENT   REFERRING PHYSICIAN: Concha Norway, MD  DIAGNOSIS: Metastatic papillary thyroid cancer  HISTORY OF PRESENT ILLNESS::Frank Ward is a 43 y.o. male who is seen for an initial consultation visit. The patient has a history of metastatic thyroid cancer which was originally diagnosed in July of 2014. The patient has had some social issues and has been lost to followup for the last 6 months. His workup in the spring of 2014 revealed widely metastatic disease including multiple bony metastases as well as numerous bilateral pulmonary nodules. A PET scan in May 2014 demonstrated a large T9 destructive lesion in particular with invasion into the spinal canal. The patient has received palliative radiation treatment to this level in [place] to a dose of 30 gray in 10 fractions. A CT-guided biopsy in late May of 2014 demonstrated metastatic thyroid carcinoma, papillary.  The patient has undergone both external beam palliative radiation treatment to the thoracic spine as well as radioactive iodine ablation in September of 2014. He was subsequently lost to followup.  The patient indicates that he has moved from [place] to [place] in the last couple of months. He is now staying with his mother here in [place]. He has been admitted with a complaint of right neck pain and shoulder pain. An MRI scan of the cervical spine demonstrated progressive pathologic compression fractures at C3 and C4. There is also an enhancing lesion along the inferior aspect of the T1 vertebral body. 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 A new 4 mm parenchymal lesion is also suspicious for metastasis. A stable right lateral ventricle lesion was  also seen. 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   PREVIOUS RADIATION THERAPY: Yes as above to the thoracic spine centered at the T9 vertebral body level to a dose of 30 gray which was completed in [place].   PAST MEDICAL HISTORY:  has a past medical history of Cancer; Thyroid disease; and Hypertension.     PAST SURGICAL HISTORY: Past Surgical History  Procedure Laterality Date  . Tendon repair    . Thyroidectomy       FAMILY HISTORY: family history is not on file.   SOCIAL HISTORY:  reports that he quit smoking about 2 years ago. His smoking use included Cigarettes. He smoked 0.00 packs per day for 10 years. He has never used smokeless tobacco. He reports that he drinks about 4.8 ounces of alcohol per week. He reports that he uses illicit drugs (Marijuana).   ALLERGIES: Review of patient's allergies indicates no known allergies.   MEDICATIONS:  Current Facility-Administered Medications  Medication Dose Route Frequency Provider Last Rate Last Dose  . bisacodyl (DULCOLAX) EC tablet 5 mg  5 mg Oral Daily Thurnell Lose, MD   5 mg at 08/30/13 [id number]  . dexamethasone (DECADRON) tablet 4 mg  4 mg Oral Q6H Nishant Dhungel, MD   4 mg at 08/30/13 1804  . guaiFENesin-dextromethorphan (ROBITUSSIN DM) 100-10 MG/5ML syrup 5 mL  5 mL Oral Q4H PRN Thurnell Lose, MD      . heparin injection 5,000 Units  5,000 Units Subcutaneous Q8H Thurnell Lose, MD   5,000 Units at 08/30/13 1355  . HYDROcodone-acetaminophen (NORCO/VICODIN) 5-325 MG per tablet 1-2 tablet  1-2 tablet Oral Q4H PRN Thurnell Lose, MD   2 tablet at 08/30/13 0736  . levothyroxine (SYNTHROID, LEVOTHROID) tablet 100 mcg  100 mcg Oral QAC breakfast Thurnell Lose, MD   100 mcg at 08/30/13 0728  . morphine 2 MG/ML injection 2 mg  2 mg Intravenous Q4H PRN Thurnell Lose, MD   2 mg at 08/30/13 1639  . morphine 4 MG/ML injection 4 mg  4 mg Intravenous Q1H PRN Tanna Furry, MD   4 mg at 08/28/13 1930  .  multivitamin with minerals tablet 1 tablet  1 tablet Oral Daily Thurnell Lose, MD   1 tablet at 08/30/13 [phone]  . ondansetron (ZOFRAN) tablet 4 mg  4 mg Oral Q6H PRN Thurnell Lose, MD       Or  . ondansetron (ZOFRAN) injection 4 mg  4 mg Intravenous Q6H PRN Thurnell Lose, MD      . pantoprazole (PROTONIX) EC tablet 40 mg  40 mg Oral Daily Nishant Dhungel, MD   40 mg at 08/30/13 1217  . polyethylene glycol (MIRALAX / GLYCOLAX) packet 17 g  17 g Oral Daily PRN Thurnell Lose, MD         REVIEW OF SYSTEMS:  A 15 point review of systems is documented in the electronic medical record. This was obtained by the nursing staff. However, I reviewed this with the patient to discuss relevant findings and make appropriate changes.  Pertinent items are noted in HPI.    PHYSICAL EXAM:  height is 6\' 4"  (1.93 m) and weight is 178 lb 10.7 oz (81.043 kg). His oral temperature is 98.7 F (37.1 C). His blood pressure is 132/91 and his pulse is 81. His respiration is 18 and oxygen saturation is 97%.   ECOG = 1  0 - Asymptomatic (Fully active, able to carry on all predisease activities without restriction)  1 - Symptomatic but completely ambulatory (Restricted in physically strenuous activity but ambulatory and able to carry out work of a light or sedentary nature. For example, light housework, office work)  2 - Symptomatic, <50% in bed during the day (Ambulatory and capable of all self care but unable to carry out any work activities. Up and about more than 50% of waking hours)  3 - Symptomatic, >50% in bed, but not bedbound (Capable of only limited self-care, confined to bed or chair 50% or more of waking hours)  4 - Bedbound (Completely disabled. Cannot carry on any self-care. Totally confined to bed or chair)  5 - Death   Eustace Pen MM, Creech RH, Tormey DC, et al. [phone]). "Toxicity and response criteria of the [facility]". [place] Oncol. 5 (6): 649-55  General:  Well-developed, in no acute distress HEENT: Normocephalic, atraumatic Cardiovascular: Regular rate and rhythm Respiratory: Clear to auscultation bilaterally GI: Soft, nontender, normal bowel sounds Extremities: No edema present, the patient complained of some tenderness in the scapular region within the right upper back Neuro: 5 out of 5 strength in the upper extremities bilaterally     LABORATORY DATA:  Lab Results  Component Value Date   WBC 3.4* 08/29/2013   HGB 12.0* 08/29/2013   HCT 36.0* 08/29/2013   MCV 96.3 08/29/2013   PLT 275 08/29/2013   Lab Results  Component Value Date   NA 139 08/29/2013   K 3.7 08/29/2013   CL 103 08/29/2013   CO2 26 08/29/2013   No results found for this basename: ALT, AST, GGT,  ALKPHOS, BILITOT      RADIOGRAPHY: Mr Kizzie Fantasia Contrast  08/28/2013   CLINICAL DATA:  Dizziness and arm pain.  Thyroid cancer.  Headaches.  EXAM: MRI HEAD WITHOUT AND WITH CONTRAST  TECHNIQUE: Multiplanar, multiecho pulse sequences of the brain and surrounding structures were obtained without and with intravenous contrast.  CONTRAST:  49mL MULTIHANCE GADOBENATE DIMEGLUMINE 529 MG/ML IV SOLN  COMPARISON:  CT of the cervical spine 07/26/2013. MRI brain without and with contrast 02/05/2013.  FINDINGS: The previously seen fourth ventricular lesion has significantly increased in size. An enhancing interventricular lesion now measures 23 x 18 x 16 mm. There is no associated hydrocephalus. The lesion within the right lateral ventricle is not significantly changed from the previous studies. This lesion is less clearly a metastatic deposit. There is a new 4 mm parenchymal lesion in the posterior right frontal lobe, likely lung primary motor cortex. No other enhancing parenchymal lesions are evident. Multiple enhancing skull lesions are present. There are no expansile lesions.  No acute infarct or hemorrhage is present. The ventricles are of normal size. No significant extra-axial fluid  collection is present.  Flow is present in the major intracranial arteries. The globes and orbits are intact. Small polyps or mucous retention cysts are noted within the maxillary sinuses bilaterally. There is fluid in the nasal lacrimal duct bilaterally. No soft tissue abnormality is present in the duct. Mucosal thickening is noted in the right frontal sinus. There is some fluid in the mastoid air cells bilaterally. No obstructing nasopharyngeal lesion is evident.  IMPRESSION: 1. 23 mm enhancing mass lesion within the fourth ventricle is compatible with interval growth of a interventricular metastasis. 2. New enhancing 4 mm parenchymal lesion in the posterior right frontal lobe is also worrisome for metastasis. 3. Stable intraventricular lesion of the right lateral ventricle is not clearly metastatic deposit. This lesion has been stable at 7 mm over the course of exams. 4. 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 Critical Value/emergent results were called by telephone at the time of interpretation on 08/28/2013 at 5:43 PM to Dr. Tanna Furry , who verbally acknowledged these results.   Electronically Signed   By: Lawrence Santiago M.D.   On: 08/28/2013 17:43   Mr Cervical Spine W Wo Contrast  08/28/2013   CLINICAL DATA:  Dizziness and arm pain.  Thyroid cancer.  EXAM: MRI CERVICAL SPINE WITHOUT AND WITH CONTRAST  TECHNIQUE: Multiplanar and multiecho pulse sequences of the cervical spine, to include the craniocervical junction and cervicothoracic junction, were obtained according to standard protocol without and with intravenous contrast.  CONTRAST:  17 mL MultiHance  COMPARISON:  CT cervical spine 07/26/2013.  FINDINGS: Pathologic compression fractures are again seen at C3 and C4 with further collapse of both levels. There is significant extraosseous tumor at the C3 level extending into the ventral epidural space, right greater than left. Tumor extends into the right neural foramen at C2-3 and  C3-4. There is tumor in the paraspinous space is well. Tumor extends into the posterior elements on the right at C3.  A 22 mm enhancing lesion is noted along the inferior left aspect of the T1 vertebral body. No other significant metastases are evident.  Normal signal is present in the cervical and upper thoracic spinal cord to the lowest imaged level, T2-3. The visualized soft tissues of the neck are otherwise unremarkable.  C2-3:  Tumor infiltration in the right neural foramen.  C3-4: Tumor infiltration is present in the right foramen  and ventral epidural space.  C4-5: A leftward disc osteophyte complex is present without significant stenosis.  C5-6: A leftward disc osteophyte complex is present. Asymmetric left-sided uncovertebral spurring results in mild left foraminal stenosis.  C6-7: Mild facet hypertrophy is present. There is no significant stenosis.  C7-T1:  Negative.  IMPRESSION: 1. Progressive pathologic compression fractures at C3 and C4. 2. Extensive extraosseous tumor at the C3 level extends into the C2-3 and C3-4 foramina as well as the ventral epidural space on the right. 3. Extension of tumor into the posterior elements of C3 on the right. 4. 22 mm enhancing lesion along the inferior left aspect of the T1 vertebral body. 5. Mild left foraminal stenosis at C5-6 secondary to a leftward disc osteophyte complex and asymmetric uncovertebral spurring. Critical Value/emergent results were called by telephone at the time of interpretation on 08/28/2013 at 5:42 PM to Dr. Tanna Furry , who verbally acknowledged these results.   Electronically Signed   By: Lawrence Santiago M.D.   On: 08/28/2013 17:42       IMPRESSION/ PLAN: The patient has widely metastatic thyroid carcinoma. The patient is a good candidate for palliative radiation for intracranial metastatic disease and the upper cervical spine could also be treated at the same time. I discussed this with the patient and I also discussed treatment to  additional possible sites including the T1 vertebral body. The patient describes his most severe area of pain in the right upper chest posteriorly and review of his CT scan from December demonstrates a lytic scapular lesion which appears to correspond to this area. I also would treat this area.  I therefore discussed with the patient a potential 10 fraction course of radiation treatment. This would consist of 30 gray to each target site. I would like to begin the patient's treatment as soon as possible. He therefore will proceed with a CT simulation on Monday such that we can begin his whole brain/upper cervical spine radiation. He also will begin palliative treatment to the T1 vertebral body and right scapula soon after.      ________________________________   Jodelle Gross, MD, PhD

## 2013-08-30 NOTE — Progress Notes (Addendum)
[facility] PROGRESS NOTE  Frank Ward [id number] DOB: [birth year] DOA: 08/28/2013 PCP: No primary provider on file.  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 As per his oncologist in [place] Dr. Celesta Aver, patient fired him from his care in August 2014 and has not followed with him since then. Patient saw Dr. Vallery Ridge for palliative care briefly during that time but again lost followup. He presented to [facility] in July 2014 for right neck and shoulder pain. He had a CT of his neck and chest which showed pathological fractures of C3 and C4 vertebrae with metastases to the cervical spine. He was evaluated by neurosurgery and recommended for an MRI of the spine but he signed out AMA from the ED. He returned to [facility] long ED on 1/14 with constant pain over the neck radiating to the right side and some dizziness. MRI of the brain and C-spine showed multiple metastases to the brain, progressive pathological compression fracture of C3 and C4 with extensive extraosseous tumor spread.   Assessment/Plan:  Aggressive thyroid cancer with widespread metastases  Patient noted for long and T-spine metastases previously and now has metastases to the cervical spine and brain. Detailed MRI results as outlined below. As per his previous oncologist in [place] patient did not continue further care since August of 2014.  -Continue supportive care for now. IV hydration, pain control and antiemetics  -No residual weakness on exam.  -Continue Synthroid.  -added decadron. -oncology consult appreciated. Recommends getting radiation onc involved to  evaluate for brain radiation. Will also assess need for RIU. Neurosurgery consult pending. -continue neurochecks  Dizziness  Likely secondary to his brain metastases. Stable now. Carotid Doppler without significant stenosis. monitor on telemetry   Acute kidney injury  Improved with hydration. We'll continue IV fluids   Code Status: Full code  Family Communication: None at bedside  Disposition Plan: Home once treatment options are discussed   Consultants:  Pending radiation oncology and neurosurgery consult  Procedures:  None  Antibiotics:  None   HPI/Subjective: Still has pain over right neck and shoulder. Denies any weakness  Objective: Filed Vitals:   08/30/13 0604  BP: 132/92  Pulse: 82  Temp: 98.4 F (36.9 C)  Resp: 18    Intake/Output Summary (Last 24 hours) at 08/30/13 1408 Last data filed at 08/30/13 1234  Gross per 24 hour  Intake    480 ml  Output      0 ml  Net    480 ml   Filed Weights   08/28/13 2028 08/29/13 0655  Weight: 81.103 kg (178 lb 12.8 oz) 81.043 kg (178 lb 10.7 oz)    Exam:  General: Middle aged male in no acute distress  HEENT: No pallor, moist oral mucosa  Chest: Clear bilaterally, no added sound  CVS: Normal S1-S2, no murmurs rub or gallop  Abdomen: Soft, nontender, nondistended, bowel sounds present  Extremities: Warm, no edema, tender to palpation over right trapezius and right posterior shoulder joint CNS: AAO x3, nonfocal   Data Reviewed: Basic Metabolic Panel:  Recent Labs Lab 08/28/13 1524 08/28/13 2101 08/29/13 0530  NA 139  --  139  K  3.9  --  3.7  CL 101  --  103  CO2 23  --  26  GLUCOSE 83  --  79  BUN 6  --  5*  CREATININE 1.42* 1.30 1.35  CALCIUM 9.3  --  8.3*   Liver Function Tests: No results found for this basename: AST, ALT, ALKPHOS, BILITOT, PROT, ALBUMIN,  in the last 168 hours No results found for this basename: LIPASE, AMYLASE,  in the last 168 hours No results found for this basename: AMMONIA,   in the last 168 hours CBC:  Recent Labs Lab 09-03-13 1524 09/03/13 2101 08/29/13 0530  WBC 4.3 4.0 3.4*  NEUTROABS 2.7  --   --   HGB 12.8* 12.9* 12.0*  HCT 37.3* 37.7* 36.0*  MCV 94.7 95.4 96.3  PLT 293 310 275   Cardiac Enzymes: No results found for this basename: CKTOTAL, CKMB, CKMBINDEX, TROPONINI,  in the last 168 hours BNP (last 3 results) No results found for this basename: PROBNP,  in the last 8760 hours CBG: No results found for this basename: GLUCAP,  in the last 168 hours  No results found for this or any previous visit (from the past 240 hour(s)).   Studies: Mr Kizzie Fantasia Contrast  09-03-2013   CLINICAL DATA:  Dizziness and arm pain.  Thyroid cancer.  Headaches.  EXAM: MRI HEAD WITHOUT AND WITH CONTRAST  TECHNIQUE: Multiplanar, multiecho pulse sequences of the brain and surrounding structures were obtained without and with intravenous contrast.  CONTRAST:  36mL MULTIHANCE GADOBENATE DIMEGLUMINE 529 MG/ML IV SOLN  COMPARISON:  CT of the cervical spine 07/26/2013. MRI brain without and with contrast 02/05/2013.  FINDINGS: The previously seen fourth ventricular lesion has significantly increased in size. An enhancing interventricular lesion now measures 23 x 18 x 16 mm. There is no associated hydrocephalus. The lesion within the right lateral ventricle is not significantly changed from the previous studies. This lesion is less clearly a metastatic deposit. There is a new 4 mm parenchymal lesion in the posterior right frontal lobe, likely lung primary motor cortex. No other enhancing parenchymal lesions are evident. Multiple enhancing skull lesions are present. There are no expansile lesions.  No acute infarct or hemorrhage is present. The ventricles are of normal size. No significant extra-axial fluid collection is present.  Flow is present in the major intracranial arteries. The globes and orbits are intact. Small polyps or mucous retention cysts are noted within the maxillary  sinuses bilaterally. There is fluid in the nasal lacrimal duct bilaterally. No soft tissue abnormality is present in the duct. Mucosal thickening is noted in the right frontal sinus. There is some fluid in the mastoid air cells bilaterally. No obstructing nasopharyngeal lesion is evident.  IMPRESSION: 1. 23 mm enhancing mass lesion within the fourth ventricle is compatible with interval growth of a interventricular metastasis. 2. New enhancing 4 mm parenchymal lesion in the posterior right frontal lobe is also worrisome for metastasis. 3. Stable intraventricular lesion of the right lateral ventricle is not clearly metastatic deposit. This lesion has been stable at 7 mm over the course of exams. 4. 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 Critical Value/emergent results were called by telephone at the time of interpretation on 09/03/2013 at 5:43 PM to Dr. Tanna Furry , who verbally acknowledged these results.   Electronically Signed   By: Lawrence Santiago M.D.   On: 2013-09-03 17:43   Mr Cervical Spine W Wo Contrast  09-03-2013   CLINICAL DATA:  Dizziness and arm pain.  Thyroid cancer.  EXAM: MRI CERVICAL SPINE WITHOUT AND WITH CONTRAST  TECHNIQUE: Multiplanar and multiecho pulse sequences of the cervical spine, to include the craniocervical junction and cervicothoracic junction, were obtained according to standard protocol without and with intravenous contrast.  CONTRAST:  17 mL MultiHance  COMPARISON:  CT cervical spine 07/26/2013.  FINDINGS: Pathologic compression fractures are again seen at C3 and C4 with further collapse of both levels. There is significant extraosseous tumor at the C3 level extending into the ventral epidural space, right greater than left. Tumor extends into the right neural foramen at C2-3 and C3-4. There is tumor in the paraspinous space is well. Tumor extends into the posterior elements on the right at C3.  A 22 mm enhancing lesion is noted along the inferior left aspect  of the T1 vertebral body. No other significant metastases are evident.  Normal signal is present in the cervical and upper thoracic spinal cord to the lowest imaged level, T2-3. The visualized soft tissues of the neck are otherwise unremarkable.  C2-3:  Tumor infiltration in the right neural foramen.  C3-4: Tumor infiltration is present in the right foramen and ventral epidural space.  C4-5: A leftward disc osteophyte complex is present without significant stenosis.  C5-6: A leftward disc osteophyte complex is present. Asymmetric left-sided uncovertebral spurring results in mild left foraminal stenosis.  C6-7: Mild facet hypertrophy is present. There is no significant stenosis.  C7-T1:  Negative.  IMPRESSION: 1. Progressive pathologic compression fractures at C3 and C4. 2. Extensive extraosseous tumor at the C3 level extends into the C2-3 and C3-4 foramina as well as the ventral epidural space on the right. 3. Extension of tumor into the posterior elements of C3 on the right. 4. 22 mm enhancing lesion along the inferior left aspect of the T1 vertebral body. 5. Mild left foraminal stenosis at C5-6 secondary to a leftward disc osteophyte complex and asymmetric uncovertebral spurring. Critical Value/emergent results were called by telephone at the time of interpretation on 08/28/2013 at 5:42 PM to Dr. Tanna Furry , who verbally acknowledged these results.   Electronically Signed   By: Lawrence Santiago M.D.   On: 08/28/2013 17:42    Scheduled Meds: . bisacodyl  5 mg Oral Daily  . dexamethasone  4 mg Oral Q6H  . heparin  5,000 Units Subcutaneous Q8H  . levothyroxine  100 mcg Oral QAC breakfast  . multivitamin with minerals  1 tablet Oral Daily  . pantoprazole  40 mg Oral Daily   Continuous Infusions:     Time spent: [place], [place]  [facility] Pager [phone]. If 7PM-7AM, please contact night-coverage at [email or web address], password [email or web address] 08/30/2013, 2:08 PM  LOS: 2 days

## 2013-08-31 LAB — CBC
HCT: 35.2 % — ABNORMAL LOW (ref 39.0–52.0)
Hemoglobin: 12.2 g/dL — ABNORMAL LOW (ref 13.0–17.0)
MCH: 32.7 pg (ref 26.0–34.0)
MCHC: 34.7 g/dL (ref 30.0–36.0)
MCV: 94.4 fL (ref 78.0–100.0)
Platelets: 300 10*3/uL (ref 150–400)
RBC: 3.73 MIL/uL — ABNORMAL LOW (ref 4.22–5.81)
RDW: 12.5 % (ref 11.5–15.5)
WBC: 6.5 10*3/uL (ref 4.0–10.5)

## 2013-08-31 MED ORDER — GABAPENTIN 300 MG PO CAPS
600.0000 mg | ORAL_CAPSULE | Freq: Three times a day (TID) | ORAL | Status: DC
  Filled 2013-08-31 (×3): qty 2

## 2013-08-31 MED ORDER — HYDROCODONE-ACETAMINOPHEN 5-325 MG PO TABS
2.0000 | ORAL_TABLET | ORAL | Status: DC | PRN
  Administered 2013-08-31 – 2013-09-08 (×32): 2 via ORAL
  Filled 2013-08-31 (×5): qty 2
  Filled 2013-08-31: qty 1
  Filled 2013-08-31 (×28): qty 2

## 2013-08-31 MED ORDER — PREGABALIN 50 MG PO CAPS
50.0000 mg | ORAL_CAPSULE | Freq: Every day | ORAL | Status: DC
  Administered 2013-08-31 – 2013-09-09 (×10): 50 mg via ORAL
  Filled 2013-08-31 (×9): qty 1

## 2013-08-31 MED ORDER — MORPHINE SULFATE 2 MG/ML IJ SOLN
2.0000 mg | INTRAMUSCULAR | Status: DC | PRN
  Administered 2013-08-31 – 2013-09-09 (×15): 2 mg via INTRAVENOUS
  Filled 2013-08-31 (×17): qty 1

## 2013-08-31 MED ORDER — KETOROLAC TROMETHAMINE 30 MG/ML IJ SOLN
30.0000 mg | Freq: Four times a day (QID) | INTRAMUSCULAR | Status: AC | PRN
  Administered 2013-08-31 – 2013-09-01 (×3): 30 mg via INTRAVENOUS
  Filled 2013-08-31 (×3): qty 1

## 2013-08-31 NOTE — Progress Notes (Signed)
Patient to be transferred to [facility] hospital after i talked to the hospitalist BUT he declined till he talks with [facility] radiation oncologist

## 2013-08-31 NOTE — Progress Notes (Signed)
Patient refused to go to [facility] until he spoke with his oncologist and rad-onc. transfer cancelled

## 2013-08-31 NOTE — Progress Notes (Signed)
[facility] PROGRESS NOTE  Reino Lybbert ATF:[id number] DOB: 03-03-1971 DOA: 08/28/2013 PCP: No primary provider on file.  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 As per his oncologist in [place] Dr. Celesta Aver, patient fired him from his care in August 2014 and has not followed with him since then. Patient saw Dr. Vallery Ridge for palliative care briefly during that time but again lost followup. He presented to [facility] in July 2014 for right neck and shoulder pain. He had a CT of his neck and chest which showed pathological fractures of C3 and C4 vertebrae with metastases to the cervical spine. He was evaluated by neurosurgery and recommended for an MRI of the spine but he signed out AMA from the ED. He returned to [facility] long ED on 1/14 with constant pain over the neck radiating to the right side and some dizziness. MRI of the brain and C-spine showed multiple metastases to the brain, progressive pathological compression fracture of C3 and C4 with extensive extraosseous tumor spread.   Assessment/Plan:  Aggressive thyroid cancer with widespread metastases  Patient noted for long and T-spine metastases previously and now has metastases to the cervical spine and brain. Detailed MRI results as outlined below. As per his previous oncologist in [place] Dr Lajean Silvius patient did not continue further care since August of 2014.  -Continue supportive care for now. IV hydration, pain control and antiemetics . Pain medications adjusted. ( added lyrica, increased frequency of morphine, continued vicodin and added prn toradol) Added lyrica for  neuropathic pain. Informed me  he could not tolerate gabapentin due to cramps. -No residual weakness on exam.  -Continue Synthroid.  -added decadron 4 mg q 6hr. -oncology consult appreciated. Recommends getting radiation onc involved to evaluate for brain radiation. Seen by radiation onc and plan on palliative radiation for intracranial metastatic disease and the upper cervical spine on Monday. Also plan on palliative radiation to thoracic spine. oncology Will also assess need for RIU.  Neurosurgery consulted. Spoke with Dr Joya Salm over the phone who after reviewing the imaging thinks he will be a candidate for c spine surgery more for palliative and prevent quadriplegia.. [facility] and plan for surgery if appropriate. -continue neurochecks  Dizziness  Likely secondary to his brain metastases. Stable now. Carotid Doppler without significant stenosis. stable on tele. Will d/c  Acute kidney injury  Improved with hydration. Off fluids  Code Status: Full code  Family Communication: None at bedside  Disposition Plan: patient to be transferred to med surg floor at [facility] for neurosurgery eval. Please consult Dr Joya Salm once patient arrives.   Consultants:  Oncology radiation oncology  pending neurosurgery consult  Procedures:  None  Antibiotics:  None   HPI/Subjective:  has worsened  pain over right  shoulder. Denies any weakness  Objective: Filed Vitals:   08/31/13 0542  BP: 140/97  Pulse: 86  Temp: 97.2 F (36.2 C)  Resp: 20    Intake/Output Summary (Last 24 hours) at 08/31/13 1158 Last data filed at 08/31/13 0900  Gross per 24 hour  Intake    960 ml  Output  0 ml  Net    960 ml   Filed Weights   08/29/13 0655 08/30/13 0604 08/31/13 0542  Weight: 81.043 kg (178 lb 10.7 oz) 81.24 kg (179 lb 1.6 oz) 82.146 kg (181 lb 1.6 oz)    Exam:  General: Middle aged male in no acute distress  HEENT: No pallor, moist oral mucosa   Chest: Clear bilaterally, no added sound  CVS: Normal S1-S2, no murmurs rub or gallop  Abdomen: Soft, nontender, nondistended, bowel sounds present  Extremities: Warm, no edema, tender to palpation over right trapezius and right posterior shoulder joint  CNS: AAO x3, nonfocal   Data Reviewed: Basic Metabolic Panel:  Recent Labs Lab 08/28/13 1524 08/28/13 2101 08/29/13 0530  NA 139  --  139  K 3.9  --  3.7  CL 101  --  103  CO2 23  --  26  GLUCOSE 83  --  79  BUN 6  --  5*  CREATININE 1.42* 1.30 1.35  CALCIUM 9.3  --  8.3*   Liver Function Tests: No results found for this basename: AST, ALT, ALKPHOS, BILITOT, PROT, ALBUMIN,  in the last 168 hours No results found for this basename: LIPASE, AMYLASE,  in the last 168 hours No results found for this basename: AMMONIA,  in the last 168 hours CBC:  Recent Labs Lab 08/28/13 1524 08/28/13 2101 08/29/13 0530 08/31/13 0623  WBC 4.3 4.0 3.4* 6.5  NEUTROABS 2.7  --   --   --   HGB 12.8* 12.9* 12.0* 12.2*  HCT 37.3* 37.7* 36.0* 35.2*  MCV 94.7 95.4 96.3 94.4  PLT 293 310 275 300   Cardiac Enzymes: No results found for this basename: CKTOTAL, CKMB, CKMBINDEX, TROPONINI,  in the last 168 hours BNP (last 3 results) No results found for this basename: PROBNP,  in the last 8760 hours CBG: No results found for this basename: GLUCAP,  in the last 168 hours  No results found for this or any previous visit (from the past 240 hour(s)).   Studies: No results found.  Scheduled Meds: . bisacodyl  5 mg Oral Daily  . dexamethasone  4 mg Oral Q6H  . heparin  5,000 Units Subcutaneous Q8H  . levothyroxine  100 mcg Oral QAC breakfast  . multivitamin with minerals  1 tablet Oral Daily  . pantoprazole  40 mg Oral Daily  . pregabalin  50 mg Oral Daily   Continuous Infusions:     Time spent: [place], [place]  [facility] Pager [phone]. If 7PM-7AM, please contact night-coverage at [email or web address], password  [facility] 08/31/2013, 11:58 AM  LOS: 3 days

## 2013-09-01 MED ORDER — SODIUM CHLORIDE 0.9 % IV SOLN
INTRAVENOUS | Status: DC
  Administered 2013-09-01 (×2): via INTRAVENOUS

## 2013-09-01 MED ORDER — DOCUSATE SODIUM 100 MG PO CAPS
100.0000 mg | ORAL_CAPSULE | Freq: Two times a day (BID) | ORAL | Status: DC
  Administered 2013-09-01 – 2013-09-09 (×15): 100 mg via ORAL
  Filled 2013-09-01 (×17): qty 1

## 2013-09-01 NOTE — Progress Notes (Signed)
[facility] PROGRESS NOTE  Cebert Dettmann DJT:[id number] DOB: 05-29-1971 DOA: 08/28/2013 PCP: No primary provider on file.  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 As per his oncologist in [place] Dr. Celesta Aver, patient fired him from his care in August 2014 and has not followed with him since then. Patient saw Dr. Vallery Ridge for palliative care briefly during that time but again lost followup. He presented to [facility] in July 2014 for right neck and shoulder pain. He had a CT of his neck and chest which showed pathological fractures of C3 and C4 vertebrae with metastases to the cervical spine. He was evaluated by neurosurgery and recommended for an MRI of the spine but he signed out AMA from the ED. He returned to [facility] long ED on 1/14 with constant pain over the neck radiating to the right side and some dizziness. MRI of the brain and C-spine showed multiple metastases to the brain, progressive pathological compression fracture of C3 and C4 with extensive extraosseous tumor spread.   Assessment/Plan:  Aggressive thyroid cancer with widespread metastases  Patient noted for lung  and T-spine metastases previously and now has metastases to the cervical spine and brain with C3-C4 fracture. .As per his previous oncologist in [place] Dr Lajean Silvius, patient did not continue further care since August of 2014.  -Continue supportive care for now. IV hydration, pain control and antiemetics . Pain medications adjusted and better controlled now. ( added lyrica, increased frequency of morphine, continued vicodin and added prn toradol)  Added lyrica  for neuropathic pain. Informed me he could not tolerate gabapentin due to cramps.  -No residual weakness on exam.  -Continue Synthroid.  -added decadron 4 mg q 6hr.  -oncology consult appreciated. Recommends getting radiation onc involved to evaluate for brain radiation. Seen by radiation onc and plan on palliative radiation for intracranial metastatic disease and the upper cervical spine on Monday. Also plan on palliative radiation to thoracic spine.  oncology Will also assess need for RIU.  Neurosurgery consulted. Spoke with Dr Joya Salm over the phone who after reviewing the imaging thinks he will be a candidate for c spine surgery more for palliative and prevent quadriplegia.. [facility]commends [facility] and plan for surgery if appropriate. Patient however refused to have neurosurgery eval before taking to his oncologist and rad onc. Will discuss tomorrow.   -continue neurochecks   Dizziness  Likely secondary to his brain metastases. Stable now. Carotid Doppler without significant stenosis. No issues on tele  Acute kidney injury  Improved with hydration. Off fluids   Code Status: Full code  Family Communication: None at bedside  Disposition Plan: patient planned for transfer to [facility] for neuro[facility] eval but refused as he wanted to discuss with the oncologist and rad onc before that.  Consultants:  Oncology  radiation oncology   Procedures:  None   Antibiotics:  None   HPI/Subjective:   pain over right shoulder and neck improved. Denies any weakness   Objective: Filed Vitals:   09/01/13 0503  BP: 136/83  Pulse: 86  Temp: 97.7 F (36.5 C)  Resp: 18    Intake/Output Summary (Last  24 hours) at 09/01/13 1400 Last data filed at 09/01/13 0811  Gross per 24 hour  Intake    600 ml  Output      0 ml  Net    600 ml   Filed Weights   08/30/13 0604 08/31/13 0542 09/01/13 0503  Weight: 81.24 kg (179 lb 1.6 oz) 82.146 kg (181 lb 1.6 oz) 82.918 kg (182 lb  12.8 oz)     Exam: General: Middle aged male in no acute distress  HEENT: No pallor, moist oral mucosa  Chest: Clear bilaterally, no added sound  CVS: Normal S1-S2, no murmurs rub or gallop  Abdomen: Soft, nontender, nondistended, bowel sounds present  Extremities: Warm, no edema, tender to palpation over right trapezius and right posterior shoulder joint  CNS: AAO x3, nonfocal        Data Reviewed: Basic Metabolic Panel:  Recent Labs Lab 08/28/13 1524 08/28/13 2101 08/29/13 0530  NA 139  --  139  K 3.9  --  3.7  CL 101  --  103  CO2 23  --  26  GLUCOSE 83  --  79  BUN 6  --  5*  CREATININE 1.42* 1.30 1.35  CALCIUM 9.3  --  8.3*   Liver Function Tests: No results found for this basename: AST, ALT, ALKPHOS, BILITOT, PROT, ALBUMIN,  in the last 168 hours No results found for this basename: LIPASE, AMYLASE,  in the last 168 hours No results found for this basename: AMMONIA,  in the last 168 hours CBC:  Recent Labs Lab 08/28/13 1524 08/28/13 2101 08/29/13 0530 08/31/13 0623  WBC 4.3 4.0 3.4* 6.5  NEUTROABS 2.7  --   --   --   HGB 12.8* 12.9* 12.0* 12.2*  HCT 37.3* 37.7* 36.0* 35.2*  MCV 94.7 95.4 96.3 94.4  PLT 293 310 275 300   Cardiac Enzymes: No results found for this basename: CKTOTAL, CKMB, CKMBINDEX, TROPONINI,  in the last 168 hours BNP (last 3 results) No results found for this basename: PROBNP,  in the last 8760 hours CBG: No results found for this basename: GLUCAP,  in the last 168 hours  No results found for this or any previous visit (from the past 240 hour(s)).   Studies: No results found.  Scheduled Meds: . bisacodyl  5 mg Oral Daily  . dexamethasone  4 mg Oral Q6H  . heparin  5,000 Units Subcutaneous Q8H  . levothyroxine  100 mcg Oral QAC breakfast  . multivitamin with minerals  1 tablet Oral Daily  . pantoprazole  40 mg Oral Daily  . pregabalin  50 mg Oral Daily   Continuous Infusions: . sodium chloride 50 mL/hr at 09/01/13 0541       Time spent: 25 minutes    Kosha Jaquith, [place]  [facility] Pager [phone]. If 7PM-7AM, please contact night-coverage at [email or web address], password [facility] 09/01/2013, 2:00 PM  LOS: 4 days

## 2013-09-02 ENCOUNTER — Telehealth: Payer: Self-pay | Admitting: *Deleted

## 2013-09-02 ENCOUNTER — Encounter: Payer: Self-pay | Admitting: Radiation Oncology

## 2013-09-02 ENCOUNTER — Ambulatory Visit: Payer: Medicaid Other | Admitting: Radiation Oncology

## 2013-09-02 DIAGNOSIS — R109 Unspecified abdominal pain: Secondary | ICD-10-CM

## 2013-09-02 DIAGNOSIS — R42 Dizziness and giddiness: Secondary | ICD-10-CM

## 2013-09-02 NOTE — Telephone Encounter (Signed)
Called the floor for patient 1511,spoke with RN," patient has NS @50ml /hr, patient eating breakfast and was medicated with vicodin at 630am," will medicate him with morphine if needed beforre he comes down fro CT Simulation at 9am I wil ask him ", thanked RN, he should be here onlying on the table 1 hour' called Lenore, RT and informed of patient status 8:26 AM

## 2013-09-02 NOTE — Progress Notes (Addendum)
[facility] PROGRESS NOTE  Frank Ward FBP:[id number] DOB: 03-Dec-1970 DOA: 08/28/2013 PCP: No primary provider on file.  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 As per his oncologist in [place] Dr. Celesta Aver, patient fired him from his care in August 2014 and has not followed with him since then. Patient saw Dr. Vallery Ridge for palliative care briefly during that time but again lost followup. He presented to [facility] in July 2014 for right neck and shoulder pain. He had a CT of his neck and chest which showed pathological fractures of C3 and C4 vertebrae with metastases to the cervical spine. He was evaluated by neurosurgery and recommended for an MRI of the spine but he signed out AMA from the ED. He returned to [facility] long ED on 1/14 with constant pain over the neck radiating to the right side and some dizziness. MRI of the brain and C-spine showed multiple metastases to the brain, progressive pathological compression fracture of C3 and C4 with extensive extraosseous tumor spread.   Assessment/Plan:  Aggressive thyroid cancer with widespread metastases  Patient noted for lung and T-spine metastases previously and now has metastases to the cervical spine and brain with C3-C4 fracture. .As per his previous oncologist in [place] Dr Lajean Silvius, patient did not continue further care since August of 2014.  -Continue supportive care for now. IV hydration, pain control and antiemetics . Pain medications adjusted and better controlled now. ( added lyrica, increased frequency of morphine, continued vicodin and added prn toradol)  Added lyrica  for neuropathic pain. Informed me he could not tolerate gabapentin due to cramps.  -No residual weakness on exam.  -Continue Synthroid.  -added decadron 4 mg q 6hr.  -oncology consult appreciated. Recommends getting radiation onc involved to evaluate for brain radiation. Seen by radiation onc and planned on palliative radiation for intracranial metastatic disease and the upper cervical spine .  Frank Ward Also plan on palliative radiation to thoracic spine. -radiation therapy held today pending neurosurgical consultation for C3-C4 fracture and role for palliative sx to the  spine. patient now agrees for neurosurgery evaluation.   -Spoke with Dr Ellene Route who will evaluate patient later today.  oncology Will also assess need for RIU as outpt.  -continue neurochecks   Dizziness  Likely secondary to his brain metastases. Stable now. Carotid Doppler without significant stenosis. No issues on tele   Acute kidney injury  Improved with hydration. Off fluids   Code Status: Full code   Family Communication: None at bedside   Disposition Plan: patient agrees on neurosurgical evaluation.   Consultants:  Oncology  radiation oncology   Procedures:  None    Antibiotics:  None    HPI/Subjective:  pain over right shoulder and neck improved. Denies any weakness   Objective: Filed Vitals:   09/02/13 0641  BP: 127/85  Pulse: 82  Temp: 97.7 F (36.5 C)  Resp: 18    Intake/Output Summary (Last 24 hours) at 09/02/13 1532 Last data filed at 09/02/13 1430  Gross per 24 hour  Intake 894.17 ml  Output      1 ml  Net 893.17 ml   Filed Weights   08/31/13  3557 09/01/13 0503 09/02/13 0641  Weight: 82.146 kg (181 lb 1.6 oz) 82.918 kg (182 lb 12.8 oz) 84.006 kg (185 lb 3.2 oz)    Exam:  General: Middle aged male in no acute distress  HEENT: No pallor, moist oral mucosa  Chest: Clear bilaterally, no added sound  CVS: Normal S1-S2, no murmurs rub or gallop  Abdomen: Soft, nontender,  nondistended, bowel sounds present  Extremities: Warm, no edema, tender to palpation over right trapezius and right posterior shoulder joint  CNS: AAO x3, nonfocal   Data Reviewed: Basic Metabolic Panel:  Recent Labs Lab 08/28/13 1524 08/28/13 2101 08/29/13 0530  NA 139  --  139  K 3.9  --  3.7  CL 101  --  103  CO2 23  --  26  GLUCOSE 83  --  79  BUN 6  --  5*  CREATININE 1.42* 1.30 1.35  CALCIUM 9.3  --  8.3*   Liver Function Tests: No results found for this basename: AST, ALT, ALKPHOS, BILITOT, PROT, ALBUMIN,  in the last 168 hours No results found for this basename: LIPASE, AMYLASE,  in the last 168 hours No results found for this basename: AMMONIA,  in the last 168 hours CBC:  Recent Labs Lab 08/28/13 1524 08/28/13 2101 08/29/13 0530 08/31/13 0623  WBC 4.3 4.0 3.4* 6.5  NEUTROABS 2.7  --   --   --   HGB 12.8* 12.9* 12.0* 12.2*  HCT 37.3* 37.7* 36.0* 35.2*  MCV 94.7 95.4 96.3 94.4  PLT 293 310 275 300   Cardiac Enzymes: No results found for this basename: CKTOTAL, CKMB, CKMBINDEX, TROPONINI,  in the last 168 hours BNP (last 3 results) No results found for this basename: PROBNP,  in the last 8760 hours CBG: No results found for this basename: GLUCAP,  in the last 168 hours  No results found for this or any previous visit (from the past 240 hour(s)).   Studies: No results found.  Scheduled Meds: . bisacodyl  5 mg Oral Daily  . dexamethasone  4 mg Oral Q6H  . docusate sodium  100 mg Oral BID  . heparin  5,000 Units Subcutaneous Q8H  . levothyroxine  100 mcg Oral QAC breakfast  . multivitamin with minerals  1 tablet Oral Daily  . pantoprazole  40 mg Oral Daily  . pregabalin  50 mg Oral Daily   Continuous Infusions: . sodium chloride 50 mL/hr at 09/02/13 0830      Time spent: 25 minutes    Irania Durell  [facility] Pager [phone] If 7PM-7AM, please contact night-coverage at [email or web address], password [facility] 09/02/2013, 3:32 PM  LOS: 5  days

## 2013-09-02 NOTE — Progress Notes (Signed)
Frank Ward   DOB:[birth year]   PZ#:[id number]   EUM#:[id number]  Subjective: No acute overnight events noted.  Patient had questions regarding transfer for neurosurgerical evaluation.  He states that he still has dizziness and pressure in his head.  He denies changes in vision, nausea/vomiting.  C/o right shoulder pain.   Objective:  Filed Vitals:   09/02/13 0641  BP: 127/85  Pulse: 82  Temp: 97.7 F (36.5 C)  Resp: 18    Body mass index is 22.55 kg/(m^2).  Intake/Output Summary (Last 24 hours) at 09/02/13 1553 Last data filed at 09/02/13 1430  Gross per 24 hour  Intake 894.17 ml  Output      1 ml  Net 893.17 ml   VQM:GQQP nourished, well developed, anxious and mild distress  SKIN: skin color, texture, turgor are normal, no rashes or significant lesions  HEAD: Normocephalic, TTP in posterior occiput area  EYES: PERRLA, EOMI  EARS: External ears normal  OROPHARYNX:no exudate, no erythema and dentition normal  NECK: supple, thyroidectomy scar well healed  LYMPH: no palpable lymphadenopathy in the cervical area  LUNGS: clear to auscultation, no wheezes or rhonchi  HEART: regular rate & rhythm and no murmurs  ABDOMEN:abdomen soft, non-tender and normal bowel sounds, slight distention BACK: Range of motion is normal, Tender to palpation on Tspine mild  EXTREMITIES:no edema, no skin discoloration  NEURO: alert & oriented x 3 with fluent speech, no focal motor/sensory deficits,   Labs:  Lab Results  Component Value Date   WBC 6.5 08/31/2013   HGB 12.2* 08/31/2013   HCT 35.2* 08/31/2013   MCV 94.4 08/31/2013   PLT 300 08/31/2013   NEUTROABS 2.7 02/01/5092   Basic Metabolic Panel:  Recent Labs Lab 08/28/13 1524 08/28/13 2101 08/29/13 0530  NA 139  --  139  K 3.9  --  3.7  CL 101  --  103  CO2 23  --  26  GLUCOSE 83  --  79  BUN 6  --  5*  CREATININE 1.42* 1.30 1.35  CALCIUM 9.3  --  8.3*   GFR Estimated Creatinine Clearance: 83 ml/min (by C-G formula based on Cr of  1.35). Liver Function Tests:   Recent Labs Lab 08/28/13 1524  INR 0.96    CBC:  Recent Labs Lab 08/28/13 1524 08/28/13 2101 08/29/13 0530 08/31/13 0623  WBC 4.3 4.0 3.4* 6.5  NEUTROABS 2.7  --   --   --   HGB 12.8* 12.9* 12.0* 12.2*  HCT 37.3* 37.7* 36.0* 35.2*  MCV 94.7 95.4 96.3 94.4  PLT 293 310 275 300    Studies:  No results found.  ASSESSMENT: 43 yo with metastatic thyroid cancer, with an aggressive columnar variant, with mets to bone, lungs and brain admitted with dizziness, right shoulder pain. He was lost to follow up from prior oncology, palliative pain management. (see consultation on 08/28/2013 for details in HPI).   PLAN:  1. Metastatic thyroid cancer complicated by  A. Brain mets, progressive.  -- Consulted radiation oncology for consideration of EBRT as the patient is symptomatic.  Follow by Dr. [name].   Awaiting neurosurgery evaluation.  Patient is agreeable to transfer per my discussion this afternoon.  -- Continue decadron per primary team.  -- Evaluate for repeat Iodide ablation (last received over 90 days ago.) Check TSH, Free T4. If not a candidate, consider starting sutent.  -- Neuro checks q 4 hours   B. C3, C4 Lesion  --Awaiting transfer for further evaluation by neurosurgery  to [facility].  Also focal XRT per Dr. [name].   C. Right neck pain secondary to B.  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 Continue lyrica.  -- Prn anti-emetics  2. Changes in stool habits, abdominal bloatiness. --Patient concerned about abdominal pain and involvement of cancer in his colon.  He was scheduled for colonoscopy as an outpatient but was lost to follow-up.  We will consider further work-up to assess EODz.  --Continue aggressive stool regiment.   3. Disposition. --Full code.   LOS:  5 Days  The patient voices understanding of current disease status and treatment options and is in agreement with the current care plan.   All questions were  answered. The patient knows to call the clinic with any problems, questions or concerns. We can certainly see the patient much sooner if necessary.   Thank you so much for allowing me to participate in the care of Frank Ward. I will continue to follow up the patient with you and assist in his care.   Anahita Cua, MD 09/02/2013  3:53 PM

## 2013-09-02 NOTE — Progress Notes (Signed)
I spoke with the patient today. Given concerns regarding stability of the cervical spine, I believe it is reasonable to proceed with surgery if the patient and neurosurgery are in agreement. Would then follow with postoperative radiation treatment to this area as well as additional, significant active sites of disease.

## 2013-09-03 ENCOUNTER — Encounter: Payer: Self-pay | Admitting: Radiation Oncology

## 2013-09-03 ENCOUNTER — Ambulatory Visit: Payer: Medicaid Other | Admitting: Radiation Oncology

## 2013-09-03 LAB — TSH: TSH: 14.051 u[IU]/mL — AB (ref 0.350–4.500)

## 2013-09-03 LAB — T4, FREE: FREE T4: 1 ng/dL (ref 0.80–1.80)

## 2013-09-03 NOTE — Consult Note (Signed)
Reason for Consult: Metastatic disease to the neck and the brain. Referring Physician: Dr. Georgiana Shore Claiborne is an 43 y.o. male.  HPI: Patient is a 43 year old right-handed individual who has had significant pain in the region of the right shoulder. He has had a number of presentations to the emergency room and it was identified that he has primary thyroid cancer. He was advised regarding the need for imaging studies and further therapy however the patient had signed himself out AMA only to return a few weeks later with increasing pain or dysfunction. In the interval it appears that his fourth ventricular lesion has increased in size. There is no hydrocephalus, however he has evidence of multiple intracranial lesions and multiple skull lesions. There is now distraction of C3 and C4. The spinal canal remains intact. Patient complains mostly of right shoulder pain and limitation of his range of motion in his right shoulder. Neurosurgical consultation is being sought to see if surgical intervention would be appropriate at this time.  Past Medical History  Diagnosis Date  . Cancer     Thyroid  . Thyroid disease   . Hypertension     Past Surgical History  Procedure Laterality Date  . Tendon repair    . Thyroidectomy      History reviewed. No pertinent family history.  Social History:  reports that he quit smoking about 2 years ago. His smoking use included Cigarettes. He smoked 0.00 packs per day for 10 years. He has never used smokeless tobacco. He reports that he drinks about 4.8 ounces of alcohol per week. He reports that he uses illicit drugs (Marijuana).  Allergies: No Known Allergies  Medications: I have reviewed the patient's current medications.  Results for orders placed during the hospital encounter of 08/28/13 (from the past 48 hour(s))  TSH     Status: Abnormal   Collection Time    09/03/13  8:20 AM      Result Value Range   TSH 14.051 (*) 0.350 - 4.500 uIU/mL   Comment:  Performed at [facility]  T4, FREE     Status: None   Collection Time    09/03/13  8:20 AM      Result Value Range   Free T4 1.00  0.80 - 1.80 ng/dL   Comment: Performed at [facility]    No results found.  Review of Systems  Eyes: Negative.   Respiratory: Negative.   Cardiovascular: Negative.   Gastrointestinal: Negative.   Genitourinary: Negative.   Musculoskeletal: Positive for neck pain.  Skin: Negative.   Neurological: Positive for weakness.  Endo/Heme/Allergies: Negative.   Psychiatric/Behavioral: The patient is nervous/anxious.    Blood pressure 128/75, pulse 88, temperature 97.8 F (36.6 C), temperature source Oral, resp. rate 18, height 6\' 4"  (1.93 m), weight 83.19 kg (183 lb 6.4 oz), SpO2 96.00%. Physical Exam  Constitutional: He is oriented to person, place, and time. He appears well-developed and well-nourished.  HENT:  Head: Normocephalic and atraumatic.  Eyes: Conjunctivae and EOM are normal. Pupils are equal, round, and reactive to light.  Neck: Normal range of motion. Neck supple.  Respiratory: Effort normal and breath sounds normal.  GI: Soft. Bowel sounds are normal.  Musculoskeletal:  Tender over right shoulder region. Able to move with good range of motion however some modest weakness in deltoid 4/5 on right side compared to left. No atrophy in major muscle groups in the upper extremities.  Neurological: He is alert and oriented to person, place,  and time. He has normal reflexes. No cranial nerve deficit.  Skin: Skin is warm and dry.  Psychiatric: He has a normal mood and affect. His behavior is normal. Thought content normal.  Anxious individual.    Assessment/Plan: Metastatic cancer presumably from the thyroid at the level of C3-C4 and intracranially in the fourth ventricle with multiple small metastases to the skull itself. I discussed this case with Dr. Arloa Koh and Dr. [name] is planning full brain radiation in addition to  radiation to the cervical spine. At this time the patient is essentially neurologically intact, I believe that the best to treat this conservatively and not proceed with any surgical intervention for the cervical spine last the becomes necessary if the patient should develop clinical instability. Will remain available to see the patient should the need arise but his primary contact will be with radiation oncology. Should further intervention becomes necessary I will certainly be available for that purpose.  Kearah Gayden J 09/03/2013, 1:11 PM

## 2013-09-03 NOTE — Progress Notes (Signed)
Frank Ward   DOB:12-Apr-1971   WJ#:[id number]   AOZ#:[id number]  Subjective: Patient was seen for radiation simulation earlier today.  He reports frustration with being discharged and complained of diplopia, headache and neck ache.  Nurse was at bedside.    Objective:  Filed Vitals:   09/03/13 1329  BP: 145/90  Pulse: 74  Temp: 97.5 F (36.4 C)  Resp: 16    Body mass index is 22.33 kg/(m^2).  Intake/Output Summary (Last 24 hours) at 09/03/13 1821 Last data filed at 09/03/13 0817  Gross per 24 hour  Intake 1276.67 ml  Output      0 ml  Net 1276.67 ml   PE:  Deferred at patient's request.   Labs:  Lab Results  Component Value Date   WBC 6.5 08/31/2013   HGB 12.2* 08/31/2013   HCT 35.2* 08/31/2013   MCV 94.4 08/31/2013   PLT 300 08/31/2013   NEUTROABS 2.7 9/62/9528   Basic Metabolic Panel:  Recent Labs Lab 08/28/13 1524 08/28/13 2101 08/29/13 0530  NA 139  --  139  K 3.9  --  3.7  CL 101  --  103  CO2 23  --  26  GLUCOSE 83  --  79  BUN 6  --  5*  CREATININE 1.42* 1.30 1.35  CALCIUM 9.3  --  8.3*   GFR Estimated Creatinine Clearance: 82.2 ml/min (by C-G formula based on Cr of 1.35). Liver Function Tests:   Recent Labs Lab 08/28/13 1524  INR 0.96    CBC:  Recent Labs Lab 08/28/13 1524 08/28/13 2101 08/29/13 0530 08/31/13 0623  WBC 4.3 4.0 3.4* 6.5  NEUTROABS 2.7  --   --   --   HGB 12.8* 12.9* 12.0* 12.2*  HCT 37.3* 37.7* 36.0* 35.2*  MCV 94.7 95.4 96.3 94.4  PLT 293 310 275 300    Studies:  No results found.  ASSESSMENT: 43 yo with metastatic thyroid cancer, with an aggressive columnar variant, with mets to bone, lungs and brain admitted with dizziness, right shoulder pain. He was lost to follow up from prior oncology, palliative pain management. (see consultation on 08/28/2013 for details in HPI).   PLAN:  1. Metastatic thyroid cancer complicated by  A. Brain mets, progressive.  -- Planning for XRT tomorrow per Dr. [name].  No neurosurgery  planned.  -- Continue decadron per primary team.  -- Evaluate for repeat Iodide ablation (last received over 90 days ago.) Check TSH, Free T4. If not a candidate, consider starting sutent.   B. C3, C4 Lesion  --Focal XRT per Dr. [name] per Dr. [name].    C. Right neck pain secondary to B.  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 Continue lyrica.  -- Prn anti-emetics  2. Social issues. --Patient might require social worker to help with home disposition. He appeared to be unsure where he was going to live upon discharge.      3. Disposition. --Full code.   LOS:  6 Days  The patient voices understanding of current disease status and treatment options and is in agreement with the current care plan.   All questions were answered. The patient knows to call the clinic with any problems, questions or concerns. We can certainly see the patient much sooner if necessary.   Thank you so much for allowing me to participate in the care of Frank Ward. I will continue to follow up the patient with you and assist in his care.   Kennedee Kitzmiller, MD  09/03/2013  6:21 PM

## 2013-09-03 NOTE — Progress Notes (Signed)
CC: Dr. [name], Dr. Kristeen Miss   The patient visited our Department this afternoon, and I reviewed with him our goals of therapy and potential acute/late toxicities. I've also been in touch with Dr. [name] is out today. Consent is signed for treatment to his brain, cervical spine, thoracic spine, and scapula. Of note is that he does have a history of previous radiation therapy to his thoracic spine at [facility]. He tells me that he wants to wait until tomorrow before undergoing treatment planning in receiving his first treatment to his brain/cervical spine. Dr. [name] will see him prior to his simulation tomorrow morning.

## 2013-09-03 NOTE — Progress Notes (Signed)
[facility] PROGRESS NOTE  Frank Ward NLZ:[id number] DOB: 10/26/1970 DOA: 08/28/2013 PCP: No primary provider on file.  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 As per his oncologist in [place] Dr. Celesta Aver, patient fired him from his care in August 2014 and has not followed with him since then. Patient saw Dr. Vallery Ridge for palliative care briefly during that time but again lost followup. He presented to [facility] in July 2014 for right neck and shoulder pain. He had a CT of his neck and chest which showed pathological fractures of C3 and C4 vertebrae with metastases to the cervical spine. He was evaluated by neurosurgery and recommended for an MRI of the spine but he signed out AMA from the ED. He returned to [facility] long ED on 1/14 with constant pain over the neck radiating to the right side and some dizziness. MRI of the brain and C-spine showed multiple metastases to the brain, progressive pathological compression fracture of C3 and C4 with extensive extraosseous tumor spread.   Assessment/Plan:  Aggressive thyroid cancer with widespread metastases  -Patient noted for lung and T-spine metastases previously and now has metastases to the cervical spine and brain with C3-C4 fracture. .As per his previous oncologist in [place] Dr Lajean Silvius, patient did not continue further care since August of 2014.  -Continue supportive care with IV hydration, pain control and antiemetics . Pain medications adjusted and better controlled now. ( added lyrica, increased frequency of morphine, continued vicodin and added prn toradol)  Added lyrica for  neuropathic pain. Informed me he could not tolerate gabapentin due to cramps.  -No residual weakness on exam.  -Continue Synthroid.  -added decadron 4 mg q 6hr.  -oncology consult appreciated. Recommends getting radiation onc involved to evaluate for brain radiation. Seen by radiation onc and planned on palliative radiation for intracranial metastatic disease and the upper cervical spine .  - Also plan on palliative radiation to thoracic spine. -radiation therapy held on 1/19 pending neurosurgical consultation for C3-C4 fracture and role for palliative sx to the  spine. -Spoke with Dr Ellene Route who consulted on the patient and recommended that given he does not have any neurological weakness, it should be managed nonsurgically and he be started on radiation therapy as planned earlier. I have spoken with Dr. Valere Dross today who will proceed with radiation therapy. -continue neurochecks   Dizziness  Likely secondary to his brain metastases. Stable now. Carotid Doppler without significant stenosis. No issues on tele   Acute kidney injury  Improved with hydration. Off fluids   Code Status: Full code   Family Communication: None at bedside   Disposition Plan: Patient to be started on radiation therapy today. Possible discharge in 1-2 days with outpt oncology follow up.   Consultants:  Oncology  radiation oncology  Neurosurgery  Procedures:  None    Antibiotics:  None    HPI/Subjective:  pain over right shoulder and neck stable. Denies any weakness   Objective: Filed Vitals:   09/03/13 1329  BP: 145/90  Pulse: 74  Temp: 97.5 F (36.4 C)  Resp: 16    Intake/Output Summary (Last 24 hours) at 09/03/13 1359 Last data  filed at 09/03/13 0817  Gross per 24 hour  Intake 1696.67 ml  Output      0 ml  Net 1696.67 ml   Filed Weights   09/01/13 0503 09/02/13 0641 09/03/13 0500  Weight: 82.918 kg (182 lb 12.8 oz) 84.006 kg (185 lb 3.2 oz) 83.19 kg (183 lb 6.4 oz)     Exam:  General: Middle aged male in no acute distress  HEENT: No pallor, moist oral mucosa  Chest: Clear bilaterally, no added sound  CVS: Normal S1-S2, no murmurs rub or gallop  Abdomen: Soft, nontender, nondistended, bowel sounds present  Extremities: Warm, no edema, tender to palpation over right trapezius and right posterior shoulder joint  CNS: AAO x3, nonfocal   Data Reviewed: Basic Metabolic Panel:  Recent Labs Lab 08/28/13 1524 08/28/13 2101 08/29/13 0530  NA 139  --  139  K 3.9  --  3.7  CL 101  --  103  CO2 23  --  26  GLUCOSE 83  --  79  BUN 6  --  5*  CREATININE 1.42* 1.30 1.35  CALCIUM 9.3  --  8.3*   Liver Function Tests: No results found for this basename: AST, ALT, ALKPHOS, BILITOT, PROT, ALBUMIN,  in the last 168 hours No results found for this basename: LIPASE, AMYLASE,  in the last 168 hours No results found for this basename: AMMONIA,  in the last 168 hours CBC:  Recent Labs Lab 08/28/13 1524 08/28/13 2101 08/29/13 0530 08/31/13 0623  WBC 4.3 4.0 3.4* 6.5  NEUTROABS 2.7  --   --   --   HGB 12.8* 12.9* 12.0* 12.2*  HCT 37.3* 37.7* 36.0* 35.2*  MCV 94.7 95.4 96.3 94.4  PLT 293 310 275 300   Cardiac Enzymes: No results found for this basename: CKTOTAL, CKMB, CKMBINDEX, TROPONINI,  in the last 168 hours BNP (last 3 results) No results found for this basename: PROBNP,  in the last 8760 hours CBG: No results found for this basename: GLUCAP,  in the last 168 hours  No results found for this or any previous visit (from the past 240 hour(s)).   Studies: No results found.  Scheduled Meds: . bisacodyl  5 mg Oral Daily  . dexamethasone  4 mg Oral Q6H  . docusate sodium  100 mg Oral BID  . heparin  5,000 Units Subcutaneous Q8H  . levothyroxine  100 mcg Oral QAC breakfast  . multivitamin with minerals  1 tablet Oral Daily  . pantoprazole  40 mg Oral Daily  . pregabalin  50 mg Oral Daily   Continuous Infusions:      Time spent: 25  minutes    Jobeth Pangilinan, [place]  [facility] Pager [phone] If 7PM-7AM, please contact night-coverage at [email or web address], password [facility] 09/03/2013, 1:59 PM  LOS: 6 days

## 2013-09-04 ENCOUNTER — Ambulatory Visit
Admit: 2013-09-04 | Discharge: 2013-09-04 | Disposition: A | Discharge status: Home / Self Care | Payer: Medicaid Other | Attending: Radiation Oncology | Admitting: Radiation Oncology

## 2013-09-04 ENCOUNTER — Inpatient Hospital Stay (HOSPITAL_COMMUNITY): Payer: Medicaid Other

## 2013-09-04 ENCOUNTER — Telehealth: Payer: Self-pay | Admitting: *Deleted

## 2013-09-04 DIAGNOSIS — K5900 Constipation, unspecified: Secondary | ICD-10-CM | POA: Insufficient documentation

## 2013-09-04 DIAGNOSIS — C801 Malignant (primary) neoplasm, unspecified: Secondary | ICD-10-CM | POA: Insufficient documentation

## 2013-09-04 DIAGNOSIS — M542 Cervicalgia: Secondary | ICD-10-CM | POA: Insufficient documentation

## 2013-09-04 DIAGNOSIS — Z79899 Other long term (current) drug therapy: Secondary | ICD-10-CM | POA: Insufficient documentation

## 2013-09-04 DIAGNOSIS — C7931 Secondary malignant neoplasm of brain: Secondary | ICD-10-CM

## 2013-09-04 DIAGNOSIS — Z7982 Long term (current) use of aspirin: Secondary | ICD-10-CM | POA: Insufficient documentation

## 2013-09-04 DIAGNOSIS — M25519 Pain in unspecified shoulder: Secondary | ICD-10-CM | POA: Insufficient documentation

## 2013-09-04 DIAGNOSIS — M2040 Other hammer toe(s) (acquired), unspecified foot: Secondary | ICD-10-CM

## 2013-09-04 DIAGNOSIS — C73 Malignant neoplasm of thyroid gland: Secondary | ICD-10-CM | POA: Insufficient documentation

## 2013-09-04 DIAGNOSIS — Z510 Encounter for antineoplastic radiation therapy: Secondary | ICD-10-CM | POA: Insufficient documentation

## 2013-09-04 MED ORDER — SORBITOL 70 % SOLN
30.0000 mL | Status: AC
  Administered 2013-09-04: 30 mL via ORAL
  Filled 2013-09-04: qty 30

## 2013-09-04 NOTE — Progress Notes (Signed)
  Radiation Oncology         [phone] ________________________________  Name: Frank Ward MRN: [id number]  Date: 09/04/2013  DOB: 11-Dec-1970  Simulation Verification Note   NARRATIVE: The patient was brought to the treatment unit and placed in the planned treatment position. The clinical setup was verified. Then port films were obtained and uploaded to the radiation oncology medical record software.  The treatment beams were carefully compared against the planned radiation fields. The position, location, and shape of the radiation fields was reviewed. The targeted volume of tissue appears to be appropriately covered by the radiation beams. Based on my personal review, I approved the simulation verification. The patient's treatment will proceed as planned.  ________________________________   Jodelle Gross, MD, PhD

## 2013-09-04 NOTE — Progress Notes (Signed)
Clinical Social Work Department BRIEF PSYCHOSOCIAL ASSESSMENT 09/04/2013  Patient:  Frank Ward,Frank Ward     Account Number:  [id number]     Admit date:  08/28/2013  Clinical Social Worker:  ,, LCSW  Date/Time:  09/04/2013 11:30 AM  Referred by:  RN  Date Referred:  09/04/2013 Referred for  Psychosocial assessment   Other Referral:   RN reports that patient had questions regarding housing. RN also concerned about patient's emotional wellbeing.   Interview type:  Patient Other interview type:    PSYCHOSOCIAL DATA Living Status:  FAMILY Admitted from facility:   Level of care:   Primary support name:  Mary Primary support relationship to patient:  PARENT Degree of support available:   Strong    CURRENT CONCERNS Current Concerns  Other - See comment   Other Concerns:   Housing, depression, transportation    SOCIAL WORK ASSESSMENT / PLAN CSW received referral from RN regarding possible SW needs for patient. CSW reviewed chart and spoke with bedside RN prior to meeting with patient. CSW met with patient at bedside. CSW introduced myself and explained role.    Patient reports he is currently living with his mom and plans to return there at DC but wanted to know his other options. Patient reports he has completed application with [employer] but they report a 6-8 month waiting list. Patient thought that [facility] has apartments that he could stay in. CSW explained SNF vs ALF difference and patient reports he wants his own apartment. Patient reports he will just stay with family until he is able to "get over cancer" and live on his own.    Patient reports that when he was diagnosed with cancer at [facility] that it was difficult to follow up with appointments due to transportation concerns. CSW offered to assist with Medicaid transportation but patient reports he has his own job and friends and family will assist as needed.    CSW inquired about patient's emotional  wellbeing considering diagnosis. Patient reports that he is aware of diagnosis but is optimistic and feels that he will be able to remain strong. CSW encouraged patient to alert staff if he begins feeling depressed and spoke about support groups. Patient is not interested in any referrals and reports he does not need to talk about his feelings with CSW.    CSW will continue to follow.   Assessment/plan status:  Psychosocial Support/Ongoing Assessment of Needs Other assessment/ plan:   Information/referral to community resources:   Medicaid transportation  SNF vs ALF information  Support groups    PATIENT'S/FAMILY'S RESPONSE TO PLAN OF CARE: Patient alert and oriented. Patient engaged in assessment but declines any resources provided. Patient appears to be in denial and believes that he will be cured from cancer. Patient is unrealistic about treatment but does report supportive family. Patient is not receptive to further explore feelings regarding diagnosis at this time. Patient agreeable for CSW to continue to provide support but reports he does not have any needs for CSW at this time.        , LCSW [phone] 

## 2013-09-04 NOTE — Progress Notes (Signed)
[facility] PROGRESS NOTE  Frank Ward XIP:[id number] DOB: 01/09/1971 DOA: 08/28/2013 PCP: No primary provider on file.  Assessment/Plan: Aggressive thyroid cancer with widespread metastases  -Patient noted for lung and T-spine metastases previously and now has metastases to the cervical spine and brain with C3-C4 fracture. .As per his previous oncologist in [facility] Dr Lajean Silvius, patient did not continue further care since August of 2014.  -Continue supportive care with IV hydration, pain control and antiemetics . -No residual weakness on exam.  -Continue Synthroid.  -added decadron 4 mg q 6hr.  -oncology consult appreciated. Recommended radiation onc for brain radiation.  - s/p simulation for radiation tx yesterday - Pending beginning radiation tx - Also plan on palliative radiation to thoracic spine. .   -continue neurochecks  Dizziness  - Likely secondary to his brain metastases. Stable now. Carotid Doppler without significant stenosis. No issues on tele  Acute kidney injury  Improved with hydration. Off fluids   Code Status: Full Family Communication: Pt in room (indicate person spoken with, relationship, and if by phone, the number) Disposition Plan: Pending   Consultants:  Radiation oncology  Neurosurgery  HPI/Subjective: No acute events noted overnight  Objective: Filed Vitals:   09/03/13 0500 09/03/13 1329 09/03/13 2053 09/04/13 0500  BP:  145/90 142/91   Pulse:  74 73   Temp:  97.5 F (36.4 C) 98.6 F (37 C)   TempSrc:  Oral Oral   Resp:  16 18   Height:      Weight: 83.19 kg (183 lb 6.4 oz)   82.419 kg (181 lb 11.2 oz)  SpO2:  97% 99%    No intake or output data in the 24 hours ending 09/04/13 0922 Filed Weights   09/02/13 0641 09/03/13 0500 09/04/13 0500  Weight: 84.006 kg (185 lb 3.2 oz) 83.19 kg (183 lb 6.4 oz) 82.419 kg (181 lb 11.2 oz)    Exam:   General:  Awake, in nad  Cardiovascular: regular, s1, s2  Respiratory: normal resp  effort, no wheezing  Abdomen: soft, nondistended  Musculoskeletal: perfused, no clubbing   Data Reviewed: Basic Metabolic Panel:  Recent Labs Lab 08/28/13 1524 08/28/13 2101 08/29/13 0530  NA 139  --  139  K 3.9  --  3.7  CL 101  --  103  CO2 23  --  26  GLUCOSE 83  --  79  BUN 6  --  5*  CREATININE 1.42* 1.30 1.35  CALCIUM 9.3  --  8.3*   Liver Function Tests: No results found for this basename: AST, ALT, ALKPHOS, BILITOT, PROT, ALBUMIN,  in the last 168 hours No results found for this basename: LIPASE, AMYLASE,  in the last 168 hours No results found for this basename: AMMONIA,  in the last 168 hours CBC:  Recent Labs Lab 08/28/13 1524 08/28/13 2101 08/29/13 0530 08/31/13 0623  WBC 4.3 4.0 3.4* 6.5  NEUTROABS 2.7  --   --   --   HGB 12.8* 12.9* 12.0* 12.2*  HCT 37.3* 37.7* 36.0* 35.2*  MCV 94.7 95.4 96.3 94.4  PLT 293 310 275 300   Cardiac Enzymes: No results found for this basename: CKTOTAL, CKMB, CKMBINDEX, TROPONINI,  in the last 168 hours BNP (last 3 results) No results found for this basename: PROBNP,  in the last 8760 hours CBG: No results found for this basename: GLUCAP,  in the last 168 hours  No results found for this or any previous visit (from the past 240 hour(s)).  Studies: No results found.  Scheduled Meds: . bisacodyl  5 mg Oral Daily  . dexamethasone  4 mg Oral Q6H  . docusate sodium  100 mg Oral BID  . heparin  5,000 Units Subcutaneous Q8H  . levothyroxine  100 mcg Oral QAC breakfast  . multivitamin with minerals  1 tablet Oral Daily  . pantoprazole  40 mg Oral Daily  . pregabalin  50 mg Oral Daily   Continuous Infusions:   Principal Problem:   Cervical spine fracture Active Problems:   Hypertension   Thyroid disease   Cancer   Primary malignant neoplasm of thyroid gland metastatic to bone   Metastatic cancer to brain  Time spent: 84min  [facility], [facility] Pager [phone]. If 7PM-7AM, please contact  night-coverage at [email or web address], password [facility] 09/04/2013, 9:22 AM  LOS: 7 days

## 2013-09-04 NOTE — Progress Notes (Signed)
Soap Suds Enema given to patient, he tolerated receiving enema without problems.  Instructed to call if he needs assistance to bathroom or while in the bathroom

## 2013-09-04 NOTE — Progress Notes (Signed)
Patient states had a very small stool.  Expresses his desire to have a GI consult so that he could discuss the possibilities of having a colonoscopy to check for cancer.  He states he has the feeling that he may have cancer in his intestines.

## 2013-09-04 NOTE — Progress Notes (Signed)
  Radiation Oncology         [phone] ________________________________  Name: Matty Vanroekel MRN: [id number]  Date: 09/04/2013  DOB: Feb 13, 1971  SIMULATION AND TREATMENT PLANNING NOTE  DIAGNOSIS:  Metastatic thyroid cancer  Site:  Brain/ cervical spine  NARRATIVE:  The patient was brought to the [facility].  Identity was confirmed.  All relevant records and images related to the planned course of therapy were reviewed.   Written consent to proceed with treatment was confirmed which was freely given after reviewing the details related to the planned course of therapy had been reviewed with the patient.  Then, the patient was set-up in a stable reproducible  supine position for radiation therapy.  CT images were obtained.  Surface markings were placed.    Medically necessary complex treatment device(s) for immobilization:  Customized thermoplastic headcast.   The CT images were loaded into the planning software.  Then the target and avoidance structures were contoured.  Treatment planning then occurred.  The radiation prescription was entered and confirmed.  A total of 2 complex treatment devices were fabricated which relate to the designed radiation treatment fields. Each of these customized fields/ complex treatment devices will be used on a daily basis during the radiation course. I have requested : Isodose Plan.   PLAN:  The patient will receive 30 Gy in 10 fractions.  ________________________________   Jodelle Gross, MD, PhD

## 2013-09-04 NOTE — Telephone Encounter (Signed)
Per Aaron Edelman, RT they need to get patient in a few minutes to try ct simulation again, patient refused yesterday afternoon, asked to call RN, spoke with Chasity,RN at [phone], she was already aware of call from [place] RT, asked her to medicate pateint for pain if needed ,they are comning to get patient in a few minutes to try again, RN stated she was just going in to let patient know and will see if he needs anything for pain,thanked RN 8:57 AM

## 2013-09-04 NOTE — Progress Notes (Signed)
Spoke with patient regarding the need to ride in wheelchair and/or bed for safety purposes.  Patient states that he will as long as the male transporter is not the one to wheel him downstairs.  This information was relayed to radiation oncology staff nurse.

## 2013-09-05 ENCOUNTER — Inpatient Hospital Stay (HOSPITAL_COMMUNITY): Payer: Medicaid Other

## 2013-09-05 ENCOUNTER — Ambulatory Visit
Admit: 2013-09-05 | Discharge: 2013-09-05 | Disposition: A | Discharge status: Home / Self Care | Payer: Medicaid Other | Attending: Radiation Oncology | Admitting: Radiation Oncology

## 2013-09-05 DIAGNOSIS — C73 Malignant neoplasm of thyroid gland: Secondary | ICD-10-CM

## 2013-09-05 DIAGNOSIS — C7952 Secondary malignant neoplasm of bone marrow: Secondary | ICD-10-CM

## 2013-09-05 DIAGNOSIS — G5690 Unspecified mononeuropathy of unspecified upper limb: Secondary | ICD-10-CM

## 2013-09-05 DIAGNOSIS — C7951 Secondary malignant neoplasm of bone: Secondary | ICD-10-CM

## 2013-09-05 DIAGNOSIS — C7949 Secondary malignant neoplasm of other parts of nervous system: Secondary | ICD-10-CM

## 2013-09-05 DIAGNOSIS — K5900 Constipation, unspecified: Secondary | ICD-10-CM

## 2013-09-05 DIAGNOSIS — C7931 Secondary malignant neoplasm of brain: Secondary | ICD-10-CM

## 2013-09-05 DIAGNOSIS — I10 Essential (primary) hypertension: Secondary | ICD-10-CM

## 2013-09-05 MED ORDER — IOHEXOL 300 MG/ML  SOLN
100.0000 mL | Freq: Once | INTRAMUSCULAR | Status: AC | PRN
  Administered 2013-09-05: 100 mL via INTRAVENOUS

## 2013-09-05 MED ORDER — IOHEXOL 300 MG/ML  SOLN: 25.0000 mL | INTRAMUSCULAR | Status: AC

## 2013-09-05 NOTE — Therapy (Signed)
[facility] Dept Therapy Treatment Record Phone [phone]   Radiation Therapy was administered to Frank Ward on: 09/05/2013  2:25 PM and was treatment # 2 out of a planned course of 10 treatments.

## 2013-09-05 NOTE — Progress Notes (Signed)
Patient given Advance Directive papers for his review at his request

## 2013-09-05 NOTE — Progress Notes (Signed)
[facility] PROGRESS NOTE  Frank Ward URK:[id number] DOB: Jul 26, 1971 DOA: 08/28/2013 PCP: No primary provider on file.  Assessment/Plan: Aggressive thyroid cancer with widespread metastases  -Patient noted for lung and T-spine metastases previously and now has metastases to the cervical spine and brain with C3-C4 fracture. .As per his previous oncologist in [facility] Dr Lajean Silvius, patient did not continue further care since August of 2014.  -Continue supportive care with IV hydration, pain control and antiemetics . -No residual weakness on exam.  -Continue Synthroid.  -Cont decadron 4 mg q 6hr.  -oncology consult appreciated. Recommended radiation onc for brain radiation.  - now undergoing radiation tx - Pt appears to be in denial of diagnosis but seems to be coming to grips with his diagnosis. Cont to offer support - Pt concerned about colon ca. CT abd/pelvis ordered to complete metastatic survey Dizziness  - Likely secondary to his brain metastases. Stable now. Carotid Doppler without significant stenosis. No issues on tele  Acute kidney injury  Improved with hydration. Off fluids   Code Status: Full Family Communication: Pt in room (indicate person spoken with, relationship, and if by phone, the number) Disposition Plan: Pending   Consultants:  Radiation oncology  Neurosurgery  HPI/Subjective: Complains of abd distension and constipation, improving  Objective: Filed Vitals:   09/04/13 0500 09/04/13 1327 09/04/13 2133 09/05/13 0620  BP:  144/85 142/79 145/86  Pulse:  88 99 80  Temp:  98.1 F (36.7 C) 97.9 F (36.6 C) 97.7 F (36.5 C)  TempSrc:  Oral Oral Oral  Resp:  18 18 20   Height:      Weight: 82.419 kg (181 lb 11.2 oz)   82.691 kg (182 lb 4.8 oz)  SpO2:  96% 96% 97%   No intake or output data in the 24 hours ending 09/05/13 1234 Filed Weights   09/03/13 0500 09/04/13 0500 09/05/13 0620  Weight: 83.19 kg (183 lb 6.4 oz) 82.419 kg (181 lb 11.2 oz)  82.691 kg (182 lb 4.8 oz)    Exam:   General:  Awake, in nad  Cardiovascular: regular, s1, s2  Respiratory: normal resp effort, no wheezing  Abdomen: soft, nondistended  Musculoskeletal: perfused, no clubbing   Data Reviewed: Basic Metabolic Panel: No results found for this basename: NA, K, CL, CO2, GLUCOSE, BUN, CREATININE, CALCIUM, MG, PHOS,  in the last 168 hours Liver Function Tests: No results found for this basename: AST, ALT, ALKPHOS, BILITOT, PROT, ALBUMIN,  in the last 168 hours No results found for this basename: LIPASE, AMYLASE,  in the last 168 hours No results found for this basename: AMMONIA,  in the last 168 hours CBC:  Recent Labs Lab 08/31/13 0623  WBC 6.5  HGB 12.2*  HCT 35.2*  MCV 94.4  PLT 300   Cardiac Enzymes: No results found for this basename: CKTOTAL, CKMB, CKMBINDEX, TROPONINI,  in the last 168 hours BNP (last 3 results) No results found for this basename: PROBNP,  in the last 8760 hours CBG: No results found for this basename: GLUCAP,  in the last 168 hours  No results found for this or any previous visit (from the past 240 hour(s)).   Studies: Dg Abd Portable 1v  09/04/2013   CLINICAL DATA:  Abdominal pain and bloating.  Constipation.  EXAM: PORTABLE ABDOMEN - 1 VIEW  COMPARISON:  Scout image for abdominal CT scan dated 01/11/2013  FINDINGS: There is fairly extensive stool throughout the nondistended colon but there is no fecal impaction in the rectum. No dilated  loops of large or small bowel. No abnormal abdominal calcifications. Osseous structures are essentially normal.  IMPRESSION: Benign appearing abdomen. Moderate amount of stool in the nondistended colon.   Electronically Signed   By: Rozetta Nunnery M.D.   On: 09/04/2013 16:39   Scheduled Meds: . bisacodyl  5 mg Oral Daily  . dexamethasone  4 mg Oral Q6H  . docusate sodium  100 mg Oral BID  . heparin  5,000 Units Subcutaneous Q8H  . levothyroxine  100 mcg Oral QAC breakfast  .  multivitamin with minerals  1 tablet Oral Daily  . pantoprazole  40 mg Oral Daily  . 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 If 7PM-7AM, please contact night-coverage at [email or web address], password [facility] 09/05/2013, 12:34 PM  LOS: 8 days

## 2013-09-05 NOTE — Progress Notes (Signed)
Frank Ward   DOB:[birth year]   MP#:[id number]   QMG#:[id number]  Subjective: Patient had simulation yesterday and 2 treatments he reports.  He denies nausea or vomiting.   He reports having an enema yesterday with some relief in constipation.   Objective:  Filed Vitals:   09/05/13 1303  BP: 135/82  Pulse: 81  Temp: 98.1 F (36.7 C)  Resp: 18    Body mass index is 22.2 kg/(m^2). No intake or output data in the 24 hours ending 09/05/13 1705   TOI:ZTIW nourished, well developed, calm  SKIN: skin color, texture, turgor are normal, no rashes or significant lesions; + tattos  HEAD: Normocephalic, TTP in posterior occiput area  EYES: PERRLA, EOMI  EARS: External ears normal  OROPHARYNX:no exudate, no erythema and dentition normal  NECK: supple, thyroidectomy scar well healed  LYMPH: no palpable lymphadenopathy in the cervical area  LUNGS: clear to auscultation, no wheezes or rhonchi  HEART: regular rate & rhythm and no murmurs  ABDOMEN:abdomen soft, non-tender and normal bowel sounds, slight distention but improved since prior exam  BACK: Range of motion is normal, Tender to palpation on Tspine mild and R shoulder EXTREMITIES:no edema, no skin discoloration  NEURO: alert & oriented x 3 with fluent speech, no focal motor/sensory deficits,   Labs:  Lab Results  Component Value Date   WBC 6.5 08/31/2013   HGB 12.2* 08/31/2013   HCT 35.2* 08/31/2013   MCV 94.4 08/31/2013   PLT 300 08/31/2013   NEUTROABS 2.7 08/28/2013   GFR Estimated Creatinine Clearance: 81.7 ml/min (by C-G formula based on Cr of 1.35). Liver Function Tests:  CBC:  Recent Labs Lab 08/31/13 0623  WBC 6.5  HGB 12.2*  HCT 35.2*  MCV 94.4  PLT 300    Studies:  Dg Abd Portable 1v  09/04/2013   CLINICAL DATA:  Abdominal pain and bloating.  Constipation.  EXAM: PORTABLE ABDOMEN - 1 VIEW  COMPARISON:  Scout image for abdominal CT scan dated 01/11/2013  FINDINGS: There is fairly extensive stool throughout the  nondistended colon but there is no fecal impaction in the rectum. No dilated loops of large or small bowel. No abnormal abdominal calcifications. Osseous structures are essentially normal.  IMPRESSION: Benign appearing abdomen. Moderate amount of stool in the nondistended colon.   Electronically Signed   By: Rozetta Nunnery M.D.   On: 09/04/2013 16:39   ASSESSMENT: 43 yo with metastatic thyroid cancer, with an aggressive columnar variant, with mets to bone, lungs and brain admitted with dizziness, right shoulder pain. He was lost to follow up from prior oncology, palliative pain management. (see consultation on 08/28/2013 for details in HPI).   PLAN:  1. Metastatic thyroid cancer complicated by  A. Brain mets, progressive.  --  XRT per Radiation oncology.   -- Consider tapering decadron per primary team.  -- Evaluate for R epeat Iodide ablation (last received over 90 days ago.) as an outpatient.  I will make the follow-up.   B. C3, C4 Lesion  --Focal XRT per Radiation oncology.    C. Right neck pain secondary to B.  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 Continue lyrica.  -- Prn anti-emetics  2. Constipation. --Patient reports improved abdominal distention with enema yesterday.      3. Disposition. --Full code.   LOS:  8 Days  The patient voices understanding of current disease status and treatment options and is in agreement with the current care plan.   All questions were answered. The patient  knows to call the clinic with any problems, questions or concerns. We can certainly see the patient much sooner if necessary.   Thank you so much for allowing me to participate in the care of [name]. I will continue to follow up the patient with you and assist in his care. I discussed plan with Dr. Wyline Copas.    Dareen Gutzwiller, MD 09/05/2013  5:05 PM

## 2013-09-06 ENCOUNTER — Ambulatory Visit
Admit: 2013-09-06 | Discharge: 2013-09-06 | Disposition: A | Discharge status: Home / Self Care | Payer: Medicaid Other | Attending: Radiation Oncology | Admitting: Radiation Oncology

## 2013-09-06 DIAGNOSIS — C7931 Secondary malignant neoplasm of brain: Secondary | ICD-10-CM

## 2013-09-06 NOTE — Progress Notes (Addendum)
Department of Radiation Oncology  Phone:  [phone] Fax:        [phone]   INPATIENT   Weekly Treatment Note    Name: Frank Ward Date: 09/06/2013 MRN: [id number] DOB: 1971-05-24   Current dose: 9 Gy  Current fraction: 3   MEDICATIONS: No current facility-administered medications for this encounter.   No current outpatient prescriptions on file.   Facility-Administered Medications Ordered in Other Encounters  Medication Dose Route Frequency Provider Last Rate Last Dose  . bisacodyl (DULCOLAX) EC tablet 5 mg  5 mg Oral Daily Thurnell Lose, MD   5 mg at 09/06/13 1001  . dexamethasone (DECADRON) tablet 4 mg  4 mg Oral Q6H Nishant Dhungel, MD   4 mg at 09/06/13 1243  . docusate sodium (COLACE) capsule 100 mg  100 mg Oral BID Nishant Dhungel, MD   100 mg at 09/06/13 1001  . guaiFENesin-dextromethorphan (ROBITUSSIN DM) 100-10 MG/5ML syrup 5 mL  5 mL Oral Q4H PRN Thurnell Lose, MD      . heparin injection 5,000 Units  5,000 Units Subcutaneous Q8H Thurnell Lose, MD   5,000 Units at 09/01/13 2129  . HYDROcodone-acetaminophen (NORCO/VICODIN) 5-325 MG per tablet 2 tablet  2 tablet Oral Q4H PRN Nishant Dhungel, MD   2 tablet at 09/06/13 1005  . levothyroxine (SYNTHROID, LEVOTHROID) tablet 100 mcg  100 mcg Oral QAC breakfast Thurnell Lose, MD   100 mcg at 09/06/13 1001  . morphine 2 MG/ML injection 2 mg  2 mg Intravenous Q3H PRN Nishant Dhungel, MD   2 mg at 09/06/13 1401  . multivitamin with minerals tablet 1 tablet  1 tablet Oral Daily Thurnell Lose, MD   1 tablet at 09/06/13 1001  . ondansetron (ZOFRAN) tablet 4 mg  4 mg Oral Q6H PRN Thurnell Lose, MD       Or  . ondansetron (ZOFRAN) injection 4 mg  4 mg Intravenous Q6H PRN Thurnell Lose, MD      . pantoprazole (PROTONIX) EC tablet 40 mg  40 mg Oral Daily Nishant Dhungel, MD   40 mg at 09/06/13 1001  . polyethylene glycol (MIRALAX / GLYCOLAX) packet 17 g  17 g Oral Daily PRN Thurnell Lose, MD   17 g  at 09/05/13 6301  . pregabalin (LYRICA) capsule 50 mg  50 mg Oral Daily Nishant Dhungel, MD   50 mg at 09/06/13 1001     ALLERGIES: Review of patient's allergies indicates no known allergies.   LABORATORY DATA:  Lab Results  Component Value Date   WBC 6.5 08/31/2013   HGB 12.2* 08/31/2013   HCT 35.2* 08/31/2013   MCV 94.4 08/31/2013   PLT 300 08/31/2013   Lab Results  Component Value Date   NA 139 08/29/2013   K 3.7 08/29/2013   CL 103 08/29/2013   CO2 26 08/29/2013   No results found for this basename: ALT, AST, GGT, ALKPHOS, BILITOT     NARRATIVE: Jentzen Valente was seen today for weekly treatment management. The chart was checked and the patient's films were reviewed. The patient is doing well with treatment. He feels that his vision has improved a little. No complaints with treatment so far. The patient does express concern about being discharged from the hospital and not being able to come for treatment on an outpatient basis.  PHYSICAL EXAMINATION: vitals were not taken for this visit.      No skin reaction at this time.  ASSESSMENT: The patient  is doing satisfactorily with treatment.  PLAN: We will continue with the patient's radiation treatment as planned. The patient is doing well with treatment and we will begin treating the superior thoracic spine also early next week as an additional target site. Hopefully this will help with some of his neck/shoulder pain. The patient's main concern today is regarding discharge. He will continue to work with the inpatient team and social work on this issue as he does need to continue treatment which I believe is vitally important for him at this time.

## 2013-09-06 NOTE — Progress Notes (Signed)
Clinical Social Work  CSW attempted to meet with patient at bedside but patient out of room for procedure. CSW will follow up at later time.  [place], [place] [phone]

## 2013-09-06 NOTE — Addendum Note (Signed)
Encounter addended by: Marye Round, MD on: 09/06/2013  5:46 PM<BR>     Documentation filed: Notes Section

## 2013-09-06 NOTE — Progress Notes (Signed)
Frank Ward   DOB:05-07-1971   OV#:[id number]   OAC#:[id number]  Subjective: Patient had simulation 2 days ago and 2 treatments and is scheduled for one today. He denies nausea or vomiting.  His dizziness persists.   Objective:  Filed Vitals:   09/06/13 0600  BP: 131/82  Pulse: 83  Temp: 98.3 F (36.8 C)  Resp: 20    Body mass index is 22.03 kg/(m^2). No intake or output data in the 24 hours ending 09/06/13 1012  WFU:XNAT nourished, well developed, bit anxious SKIN: skin color, texture, turgor are normal, no rashes or significant lesions; HEAD: Normocephalic, TTP in posterior occiput area  LYMPH: no palpable lymphadenopathy in the cervical area  LUNGS: clear to auscultation, no wheezes or rhonchi  HEART: regular rate & rhythm and no murmurs  ABDOMEN:abdomen soft, non-tender and normal bowel sounds EXTREMITIES:no edema, no skin discoloration  NEURO: alert & oriented x 3 with fluent speech, no focal motor/sensory deficits,   Labs:  Lab Results  Component Value Date   WBC 6.5 08/31/2013   HGB 12.2* 08/31/2013   HCT 35.2* 08/31/2013   MCV 94.4 08/31/2013   PLT 300 08/31/2013   NEUTROABS 2.7 08/28/2013   GFR Estimated Creatinine Clearance: 81.1 ml/min (by C-G formula based on Cr of 1.35). Liver Function Tests:  CBC:  Recent Labs Lab 08/31/13 0623  WBC 6.5  HGB 12.2*  HCT 35.2*  MCV 94.4  PLT 300    Studies:  Ct Abdomen Pelvis W Contrast  09/05/2013   CLINICAL DATA:  Metastatic thyroid cancer. Radiation therapy ongoing. Abdominal distention.  EXAM: CT ABDOMEN AND PELVIS WITH CONTRAST  TECHNIQUE: Multidetector CT imaging of the abdomen and pelvis was performed using the standard protocol following bolus administration of intravenous contrast.  CONTRAST:  154mL OMNIPAQUE IOHEXOL 300 MG/ML  SOLN  COMPARISON:  DG ABD PORTABLE 1V dated 09/04/2013; CT ASPIRATION dated 01/11/2013  FINDINGS: Numerous large metastases are again noted at both lung bases, grossly unchanged from the recent  chest CT. The largest at the right lung base measures 2.2 cm on image 6. The largest in the left lower lobe measures 2.5 cm on image 5. There is no significant pleural or pericardial effusion.  The liver, gallbladder, spleen and pancreas appear normal. There is no adrenal mass. Both kidneys appear normal without evidence of hydronephrosis.  A large amount of stool is present throughout the colon. There is no bowel wall thickening, adenopathy or inflammatory change. There are no significant vascular findings. The urinary bladder and prostate gland appear normal.  Large expansile lytic lesion involving the right sacrum and adjacent iliac bone has enlarged, measuring 5.7 x 4.2 cm on image 55. This demonstrates anterior extension into the right iliacus muscle. A lesion anteriorly in the right iliac bone also appears slightly larger, measuring 2.6 cm on image 65. The right posterior paraspinal mass at T12-L1 has slightly decreased in size. This partially destroys the posterior elements on the right at L2. There is no significant intraspinal extension. The metastasis involving the left aspect of the T12 vertebral body appears slightly progressive. There is a small lytic lesion in the left ischium.  IMPRESSION: 1. Widespread pulmonary metastases are grossly unchanged. 2. Enlarging osseous metastases as described. The soft tissue component in the posterior right paraspinal region has slightly improved. No epidural tumor identified. 3. No extraosseous abdominal pelvic metastases identified.   Electronically Signed   By: Camie Patience M.D.   On: 09/05/2013 19:50   Dg Abd Portable 1v  09/04/2013  CLINICAL DATA:  Abdominal pain and bloating.  Constipation.  EXAM: PORTABLE ABDOMEN - 1 VIEW  COMPARISON:  Scout image for abdominal CT scan dated 01/11/2013  FINDINGS: There is fairly extensive stool throughout the nondistended colon but there is no fecal impaction in the rectum. No dilated loops of large or small bowel. No  abnormal abdominal calcifications. Osseous structures are essentially normal.  IMPRESSION: Benign appearing abdomen. Moderate amount of stool in the nondistended colon.   Electronically Signed   By: Rozetta Nunnery M.D.   On: 09/04/2013 16:39   ASSESSMENT: 43 yo with metastatic thyroid cancer, with an aggressive columnar variant, with mets to bone, lungs and brain admitted with dizziness, right shoulder pain. He was lost to follow up from prior oncology, palliative pain management. (see consultation on 08/28/2013 for details in HPI).   PLAN:  1. Metastatic thyroid cancer complicated by  A. Brain mets, progressive.  --  XRT per Radiation oncology.   -- Decadron per primary team.  -- Evaluate for R epeat Iodide ablation (last received over 90 days ago.) as an outpatient.  I will make the follow-up around the end of his XRT to discuss next treatment.   B. C3, C4 Lesion  --Focal XRT per Radiation oncology.    C. Right neck pain secondary to B.  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 Continue lyrica and consider increase to bid dosing.   2. Constipation. --Patient reports improved abdominal distention with enema yesterday. --Miralax daily.       3. Anemia. --Asymptomatic presently.   Likely anemia of chronic disease.   4. Disposition. --Full code.   LOS:  9 Days  The patient voices understanding of current disease status and treatment options and is in agreement with the current care plan.   All questions were answered. The patient knows to call the clinic with any problems, questions or concerns. We can certainly see the patient much sooner if necessary.   Thank you so much for allowing me to participate in the care of Frank Ward. I will continue to follow up the patient with you and assist in his care.    Sahira Cataldi, MD 09/06/2013  10:12 AM

## 2013-09-06 NOTE — Progress Notes (Signed)
Clinical Social Work  CSW received call from RN reporting that patient wants to talk with CSW now. CSW and CM met with patient at bedside. Patient agitated and reports he does not think he is ready to be DC. Patient reports he is homeless and the weather will cause problems for his cancer. CM explained that when patient is medically DC then he can follow up on outpatient basis for radiation. Patient reports the he was told by oncologist that he could stay in the hospital until his radiation treatment were completed and he has housing concerns. CSW and patient discussed housing issues again. Patient reports that he is on housing authority list but they are expecting 6-8 month waiting list. Patient had currently reported he was living with mom but now reports he was staying in a hotel and does not have enough money to return. CSW explained that patient is not eligible for ALF placement is he is refusing to use disability money for placement. Patient reports that after radiation he will have a plan but will not disclose plans to CSW. CSW inquired about staying with family since patient had previously reported that they are supportive and he could stay with them as long as he needed but patient reports it is not an option and will not disclose any further information. Patient is now reporting concerns with transportation to radiation. CSW inquired about patient's previously stating that family could assist but patient reports it is none of CSW business. CSW provided information for Medicaid transportation. Patient is very guarded and reports he will not discuss any further plans with CSW. CSW offered to assist with advanced directives packet that patient had on bed but patient refused help. CSW will continue to follow.  [place], [place] [phone]

## 2013-09-06 NOTE — Progress Notes (Addendum)
Extensive conversation with patient and cousin. Patient upset and concerned about his disposition. States that he feels that he should be able to stay in the hospital for the course of his radiation treatments. I explained that once a patient is medically cleared, they are usually discharged home and can receive radiation treatment as an outpatient.  Per nurse, MD stated that he would discharge patient later this evening. MD paged. Upon returning call, Dr. Wyline Copas states that the patient is now transferred to Dr. Wendee Beavers who will see pt. tomorrow. Wile in room, pt's cousin stated that she would make certain that he had transportation to and from his outpatient radiation visits.

## 2013-09-06 NOTE — Progress Notes (Signed)
Night Chaplain visit on referral from Day Chaplain.  Mr Renz is frighten, agitated and reports he feels he should be able to remain in the hospital while he is taking his radiation. This feeling is fueled greatly by his fear of dying. He reports he was diagnosis with thyroid cancer last year and that it is now spread to his lungs, brain and other places. It is probable that he feels guilt at not more aggressively seeking treatment for the thyroid cancer which may have given him a greater chance of survival. He feels that Physicians have changed their plans of treatment from Chemo Therapy to Radiation. He thus feels that his care is not seriously being considered, and that his care is minimalist. These feelings resulted in an outburst of anger at the staff. The root of this outburst is likely rooted in his shock over being told recently that his cancer has spread. He has yet to come to terms with this diagnosis.  Mr Jacober reports he is a practicing [employer] who takes his spiritual life seriously. His fear of not connecting with God has been heightened by his fear of dying. In order to ease his anxiety the chaplain assisted Mr Swatzell in directed imagery relaxation mediation. During this he relaxed and his stress was greatly reduced. He said that he realizes that his stress was counter-productive to fighting cancer and was pleased at finding an outlet to his anxiety and anger.  RECOMMEND: Staff be sensitive to Mr [facility] fears and be aware that his fears are likely distracting him from truly listening and understanding what is being said to him. Chaplain visits should continue to assist Mr Aloi in reducing his emotional and spiritual stress. When he begins treatment in the [facility] the center's chaplain assist Mr Kydd to find balance and meaning while undergoing treatments.  Sallee Lange. Lyndon Chenoweth, DMin, MDiv, MA Chaplain

## 2013-09-06 NOTE — Progress Notes (Signed)
[facility] PROGRESS NOTE  Frank Ward URK:[id number] DOB: [birth year] DOA: 08/28/2013 PCP: No primary provider on file.  Assessment/Plan: Aggressive thyroid cancer with widespread metastases  -Patient noted for lung and T-spine metastases previously and now has metastases to the cervical spine and brain with C3-C4 fracture. .As per his previous oncologist in [facility] Dr Lajean Silvius, patient did not continue further care since August of 2014.  -Continue supportive care with IV hydration, pain control and antiemetics . -No residual weakness on exam.  -Continue Synthroid.  -Cont decadron 4 mg q 6hr with taper per Oncology recs.  -oncology consult appreciated. Recommended radiation onc for brain radiation.  - now undergoing radiation tx, plans for outpt tx - Pt appears to be in denial of diagnosis but seems to be coming to grips with his diagnosis. Cont to offer support - Pt was concerned about colon ca. CT abd/pelvis was ordered - showing large amounts of stool. No evidence of colon Ca - Had discussed case with Dr. Juliann Mule yesterday with plans for cont radiation tx as an outpatient Dizziness  - Likely secondary to his brain metastases. Stable now. Carotid Doppler without significant stenosis. No issues on tele  Acute kidney injury  Improved with hydration. Now off fluids   Code Status: Full Family Communication: Pt in room (indicate person spoken with, relationship, and if by phone, the number) Disposition Plan: Pending   Consultants:  Radiation oncology  Neurosurgery  HPI/Subjective: Still feeling constipated.  Objective: Filed Vitals:   09/05/13 0620 09/05/13 1303 09/05/13 2200 09/06/13 0600  BP: 145/86 135/82 143/80 131/82  Pulse: 80 81 92 83  Temp: 97.7 F (36.5 C) 98.1 F (36.7 C) 97.9 F (36.6 C) 98.3 F (36.8 C)  TempSrc: Oral Oral Oral Oral  Resp: 20 18 20 20   Height:      Weight: 82.691 kg (182 lb 4.8 oz)   82.056 kg (180 lb 14.4 oz)  SpO2: 97% 96% 96% 96%    No intake or output data in the 24 hours ending 09/06/13 1243 Filed Weights   09/04/13 0500 09/05/13 0620 09/06/13 0600  Weight: 82.419 kg (181 lb 11.2 oz) 82.691 kg (182 lb 4.8 oz) 82.056 kg (180 lb 14.4 oz)    Exam:   General:  Awake, in nad  Cardiovascular: regular, s1, s2  Respiratory: normal resp effort, no wheezing  Abdomen: soft, nondistended  Musculoskeletal: perfused, no clubbing   Data Reviewed: Basic Metabolic Panel: No results found for this basename: NA, K, CL, CO2, GLUCOSE, BUN, CREATININE, CALCIUM, MG, PHOS,  in the last 168 hours Liver Function Tests: No results found for this basename: AST, ALT, ALKPHOS, BILITOT, PROT, ALBUMIN,  in the last 168 hours No results found for this basename: LIPASE, AMYLASE,  in the last 168 hours No results found for this basename: AMMONIA,  in the last 168 hours CBC:  Recent Labs Lab 08/31/13 0623  WBC 6.5  HGB 12.2*  HCT 35.2*  MCV 94.4  PLT 300   Cardiac Enzymes: No results found for this basename: CKTOTAL, CKMB, CKMBINDEX, TROPONINI,  in the last 168 hours BNP (last 3 results) No results found for this basename: PROBNP,  in the last 8760 hours CBG: No results found for this basename: GLUCAP,  in the last 168 hours  No results found for this or any previous visit (from the past 240 hour(s)).   Studies: Ct Abdomen Pelvis W Contrast  09/05/2013   CLINICAL DATA:  Metastatic thyroid cancer. Radiation therapy ongoing. Abdominal distention.  EXAM:  CT ABDOMEN AND PELVIS WITH CONTRAST  TECHNIQUE: Multidetector CT imaging of the abdomen and pelvis was performed using the standard protocol following bolus administration of intravenous contrast.  CONTRAST:  145mL OMNIPAQUE IOHEXOL 300 MG/ML  SOLN  COMPARISON:  DG ABD PORTABLE 1V dated 09/04/2013; CT ASPIRATION dated 01/11/2013  FINDINGS: Numerous large metastases are again noted at both lung bases, grossly unchanged from the recent chest CT. The largest at the right lung base  measures 2.2 cm on image 6. The largest in the left lower lobe measures 2.5 cm on image 5. There is no significant pleural or pericardial effusion.  The liver, gallbladder, spleen and pancreas appear normal. There is no adrenal mass. Both kidneys appear normal without evidence of hydronephrosis.  A large amount of stool is present throughout the colon. There is no bowel wall thickening, adenopathy or inflammatory change. There are no significant vascular findings. The urinary bladder and prostate gland appear normal.  Large expansile lytic lesion involving the right sacrum and adjacent iliac bone has enlarged, measuring 5.7 x 4.2 cm on image 55. This demonstrates anterior extension into the right iliacus muscle. A lesion anteriorly in the right iliac bone also appears slightly larger, measuring 2.6 cm on image 65. The right posterior paraspinal mass at T12-L1 has slightly decreased in size. This partially destroys the posterior elements on the right at L2. There is no significant intraspinal extension. The metastasis involving the left aspect of the T12 vertebral body appears slightly progressive. There is a small lytic lesion in the left ischium.  IMPRESSION: 1. Widespread pulmonary metastases are grossly unchanged. 2. Enlarging osseous metastases as described. The soft tissue component in the posterior right paraspinal region has slightly improved. No epidural tumor identified. 3. No extraosseous abdominal pelvic metastases identified.   Electronically Signed   By: Camie Patience M.D.   On: 09/05/2013 19:50   Dg Abd Portable 1v  09/04/2013   CLINICAL DATA:  Abdominal pain and bloating.  Constipation.  EXAM: PORTABLE ABDOMEN - 1 VIEW  COMPARISON:  [place] image for abdominal CT scan dated 01/11/2013  FINDINGS: There is fairly extensive stool throughout the nondistended colon but there is no fecal impaction in the rectum. No dilated loops of large or small bowel. No abnormal abdominal calcifications. Osseous  structures are essentially normal.  IMPRESSION: Benign appearing abdomen. Moderate amount of stool in the nondistended colon.   Electronically Signed   By: Rozetta Nunnery M.D.   On: 09/04/2013 16:39   Scheduled Meds: . bisacodyl  5 mg Oral Daily  . dexamethasone  4 mg Oral Q6H  . docusate sodium  100 mg Oral BID  . heparin  5,000 Units Subcutaneous Q8H  . levothyroxine  100 mcg Oral QAC breakfast  . multivitamin with minerals  1 tablet Oral Daily  . pantoprazole  40 mg Oral Daily  . 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 If 7PM-7AM, please contact night-coverage at [email or web address], password [facility] 09/06/2013, 12:43 PM  LOS: 9 days

## 2013-09-06 NOTE — Progress Notes (Signed)
[facility] Therapy Treatment Record Phone [phone]   Radiation Therapy was administered to Frank Ward on: 09/06/2013  3:15 PM and was treatment # 3 out of a planned course of 10 treatments.

## 2013-09-07 MED ORDER — FLEET ENEMA 7-19 GM/118ML RE ENEM
1.0000 | ENEMA | Freq: Once | RECTAL | Status: DC
  Filled 2013-09-07: qty 1

## 2013-09-07 NOTE — Progress Notes (Addendum)
Consult received to address homelessness.  Chart reviewed.  Noted that Unit Director verified that Pt can go stay with his cousin and that cousin will provide transportation to and from radiation appts.  Spoke with RN.  RN stating that Pt is ready for d/c, per MD and that Pt needs assistance with d/c planning.  Additionally, RN stating that Pt has concerns about the witnesses for his [place]inger, as they were strangers and he doesn't feel that they validate his wishes.  Spoke with Pt.  CSW clarified with Pt that witnesses to Adv Dir cannot be family members.  After an extensive discussion on Adv Dir, Pt voiced an understanding as to why non-family members are required as witnesses.  CSW and Pt discussed d/c.  Pt was adamant that he was not going to d/c and that he only needs 7 more days of his radiation.  He kept saying, "What's 7 more days?"  CSW attempted to discuss with Pt the notion that everything that's being done for Pt in the hospital can be done on an outpt basis.  Pt was unwilling to hear this, stating that he is too weak to leave the hospital and that he doesn't want to be subjected to germs during transport to and from the [facility] for his radiation treatments.  Pt stated, too, that he has no where to go and that his cousin is not an option.  Pt asked numerous times to remain in the hospital until Monday so that he can confer with all his doctors as to the best course of tx for him, as, per Pt, his cancer MDs told him that he will remain in the hospital to finish out his treatments.  Pt became frustrated and stated that he wants to wait until Monday, too, so that he can call the "FBI and homicide," as he stated that the hospital is trying to kill him.  He then stated that he wanted his HCPOA document destroyed because he didn't want his aunt or cousin to have any say in his life, dead or alive.  CSW validated Pt's concerns and frustration but Pt was unwilling to continue to speak with CSW  and told CSW, "Get out of my goddamn room."  CSW left the room and conferred with Chaplain, Therapist, sports and charge RN, Scot Dock.  Shon contacted MD.  MD to order psych consult.  Pt's aunt called and spoke with charge RN, Scot Dock.  Shon asked aunt to hold, as Pt indicated to CSW that he didn't want aunt involved.  CSW returned to Pt's room and notified Pt that his aunt was on the phone.  Pt gave CSW the ok to have medical staff speak with his aunt.  He also stated that he wanted to keep his HCPOA document as it is, "for now."  Relayed info to charge RN, [place].  Shon spoke with Pt's aunt.  Per aunt, Pt's cousin misunderstood and is not able to have Pt stay with her.  She is, however, able to provide transportation to and from radiation appts.  MD notified.  CSW to follow tomorrow.  Bernita Raisin, [place] [employer] [phone]   [place]

## 2013-09-07 NOTE — Progress Notes (Signed)
[facility] PROGRESS NOTE  Frank Ward WUJ:[id number] DOB: 1971/01/29 DOA: 08/28/2013 PCP: No primary provider on file.  Assessment/Plan: Aggressive thyroid cancer with widespread metastases  -Patient noted for lung and T-spine metastases previously and now has metastases to the cervical spine and brain with C3-C4 fracture. .As per his previous oncologist in [facility] Dr Lajean Silvius, patient did not continue further care since August of 2014.  -Continue supportive care with IV hydration, pain control and antiemetics . -Continue Synthroid.  -Cont decadron 4 mg q 6hr with taper per Oncology recs.  -oncology consult appreciated. Recommended radiation onc for brain radiation.  - now undergoing radiation tx, plans for outpt tx as patient will need several treatments. - Pt was concerned about colon ca. CT abd/pelvis was ordered - showing large amounts of stool. No evidence of colon Ca - Discussed case with Dr. Juliann Mule and plans for cont radiation tx as an outpatient - Social worker on board to help set up follow up appointments as outpatient. Reportedly patient to stay with cousin once discharged.  I have discussed placement into SNF should patient feel like he needs further care but patient refuses to go to SNF.  Per my discussion with nursing transportation to [place] radiation therapy can be arranged as outpatient as well. - Will consult psychiatry for evaluation as patient has been explained his options but continues to refuse reasonable options once transitioned from hospital. Would like to know if patient has the medical capacity to make his own decisions.  Dizziness  - Likely secondary to his brain metastases. Stable now. Carotid Doppler without significant stenosis. No issues on tele   Acute kidney injury  Improved with hydration. Now off fluids   Code Status: Full Family Communication: Pt in room (indicate person spoken with, relationship, and if by phone, the number) Disposition Plan:  Pending   Consultants:  Radiation oncology  Neurosurgery  HPI/Subjective: Patient would like to stay in the hospital for the course of 10 radiation therapies planned. I have had discussion with patient stating that despite the fact that it is convenient for him if he does not meet medical criteria to stay in the hospital that we will have to arrange followup either through skilled nursing facility. The patient denies skilled nursing facility and does not want to be discharged to a skilled nursing facility. He states that he likes having his own room and is afraid of catching germs at other facility. I explained that he was in the hospital and his risk of catching germs were higher here at the hospital but patient continued to refuse. Per my discussion with the oncologist patient has been seen at prior hospitals and has been escorted out by security on prior visits to the hospital because of a similar situation.  Objective: Filed Vitals:   09/06/13 1326 09/06/13 2200 09/07/13 0600 09/07/13 1356  BP: 146/91 127/81 122/86 144/80  Pulse: 68 90 82 88  Temp: 98 F (36.7 C) 98.2 F (36.8 C) 98 F (36.7 C) 98.2 F (36.8 C)  TempSrc: Oral Oral Oral Oral  Resp: 18 20 18 18   Height:      Weight:      SpO2: 97% 98% 97% 95%    Intake/Output Summary (Last 24 hours) at 09/07/13 1655 Last data filed at 09/07/13 1300  Gross per 24 hour  Intake    840 ml  Output      0 ml  Net    840 ml   Filed Weights   09/04/13 0500  09/05/13 0620 09/06/13 0600  Weight: 82.419 kg (181 lb 11.2 oz) 82.691 kg (182 lb 4.8 oz) 82.056 kg (180 lb 14.4 oz)    Exam:   General:  Awake, in nad, non toxic  Cardiovascular: regular, s1, s2, no rubs  Respiratory: normal resp effort, no wheezing  Abdomen: soft, nondistended, NT  Musculoskeletal: perfused, no clubbing   Data Reviewed: Basic Metabolic Panel: No results found for this basename: NA, K, CL, CO2, GLUCOSE, BUN, CREATININE, CALCIUM, MG, PHOS,  in the  last 168 hours Liver Function Tests: No results found for this basename: AST, ALT, ALKPHOS, BILITOT, PROT, ALBUMIN,  in the last 168 hours No results found for this basename: LIPASE, AMYLASE,  in the last 168 hours No results found for this basename: AMMONIA,  in the last 168 hours CBC: No results found for this basename: WBC, NEUTROABS, HGB, HCT, MCV, PLT,  in the last 168 hours Cardiac Enzymes: No results found for this basename: CKTOTAL, CKMB, CKMBINDEX, TROPONINI,  in the last 168 hours BNP (last 3 results) No results found for this basename: PROBNP,  in the last 8760 hours CBG: No results found for this basename: GLUCAP,  in the last 168 hours  No results found for this or any previous visit (from the past 240 hour(s)).   Studies: Ct Abdomen Pelvis W Contrast  09/05/2013   CLINICAL DATA:  Metastatic thyroid cancer. Radiation therapy ongoing. Abdominal distention.  EXAM: CT ABDOMEN AND PELVIS WITH CONTRAST  TECHNIQUE: Multidetector CT imaging of the abdomen and pelvis was performed using the standard protocol following bolus administration of intravenous contrast.  CONTRAST:  124mL OMNIPAQUE IOHEXOL 300 MG/ML  SOLN  COMPARISON:  DG ABD PORTABLE 1V dated 09/04/2013; CT ASPIRATION dated 01/11/2013  FINDINGS: Numerous large metastases are again noted at both lung bases, grossly unchanged from the recent chest CT. The largest at the right lung base measures 2.2 cm on image 6. The largest in the left lower lobe measures 2.5 cm on image 5. There is no significant pleural or pericardial effusion.  The liver, gallbladder, spleen and pancreas appear normal. There is no adrenal mass. Both kidneys appear normal without evidence of hydronephrosis.  A large amount of stool is present throughout the colon. There is no bowel wall thickening, adenopathy or inflammatory change. There are no significant vascular findings. The urinary bladder and prostate gland appear normal.  Large expansile lytic lesion involving  the right sacrum and adjacent iliac bone has enlarged, measuring 5.7 x 4.2 cm on image 55. This demonstrates anterior extension into the right iliacus muscle. A lesion anteriorly in the right iliac bone also appears slightly larger, measuring 2.6 cm on image 65. The right posterior paraspinal mass at T12-L1 has slightly decreased in size. This partially destroys the posterior elements on the right at L2. There is no significant intraspinal extension. The metastasis involving the left aspect of the T12 vertebral body appears slightly progressive. There is a small lytic lesion in the left ischium.  IMPRESSION: 1. Widespread pulmonary metastases are grossly unchanged. 2. Enlarging osseous metastases as described. The soft tissue component in the posterior right paraspinal region has slightly improved. No epidural tumor identified. 3. No extraosseous abdominal pelvic metastases identified.   Electronically Signed   By: Camie Patience M.D.   On: 09/05/2013 19:50   Scheduled Meds: . bisacodyl  5 mg Oral Daily  . dexamethasone  4 mg Oral Q6H  . docusate sodium  100 mg Oral BID  . heparin  5,000  Units Subcutaneous Q8H  . levothyroxine  100 mcg Oral QAC breakfast  . multivitamin with minerals  1 tablet Oral Daily  . pantoprazole  40 mg Oral Daily  . 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

## 2013-09-07 NOTE — Progress Notes (Addendum)
Rounding Visit from [place] still is insistent that he be allowed to remain in the hospital for the entire course of his treatment.  Prayer for calm and mediation support was rendered  Mediation exercises both guided and unguided seems to have positive affect to lessen his stress and anger.  Frank Ward continues to be frightened of leaving the hospital. He fears that he will be sent to a disease ridden shelter. This differs from the statement of his cousin who reported last night that she would take him in and make sure he gets to his out patient appointments.  Sallee Lange. Jakevion Arney, [place]

## 2013-09-08 DIAGNOSIS — F4322 Adjustment disorder with anxiety: Secondary | ICD-10-CM

## 2013-09-08 NOTE — Progress Notes (Signed)
PT Cancellation Note  Patient Details Name: Frank Ward MRN: [id number] DOB: March 12, 1971   Cancelled Treatment:    Reason Eval/Treat Not Completed: Patient declined, no reason specified (migraine and feeling like "head is going to explode")   Gretchen Weinfeld LUBECK 09/08/2013, 3:36 PM

## 2013-09-08 NOTE — Consult Note (Signed)
[facility] Face-to-Face Psychiatry Consult   Reason for Consult:  Capacity Referring Physician:  Dr Aleene Davidson Frank Ward is an 43 y.o. male.  Assessment: AXIS I:  Adjustment disorder AXIS II:  Deferred AXIS III:   Past Medical History  Diagnosis Date  . Cancer     Thyroid  . Thyroid disease   . Hypertension    AXIS IV:  problems with access to health care services AXIS V:  61-70 mild symptoms  Plan:  No evidence of imminent risk to self or others at present.   Patient does not meet criteria for psychiatric inpatient admission. Supportive therapy provided about ongoing stressors.  Subjective:   Frank Ward is a 43 y.o. male patient admitted with thyroid cancer status post radiation.  HPI:  Patient seen chart reviewed.  The patient is a 43 year old African American single man who was admitted on the medical floor because of the treatment of aggressive thyroid cancer with widespread metastases.  Consult was called because patient was refusing to go to skill nursing facility.  The patient has no previous history of psychiatric illness.  Patient appears very upset, anxious and tense than he find out that psychiatrist is seeing him.  Patient told that he is been given conflicting messages about his treatment.  He mentioned that he has multiple tumors in his body any need appropriate treatment which he was promised by his oncologist.  The patient is been here for more than 10 days and he has seen marked improvement that he insists that he needed more treatment.  Patient told that he's been tired getting mixed messages and now he wants to record the conversation.  After that he start recording this conversation and I have asked Dr. Wendee Beavers to join [place] to give him clarification and treatment option.  Dr. Wendee Beavers explained about treatment and recommended that he may require skilled nursing facility and continued to get treatment.  Patient is given that nobody is refusing the treatment however decision to stay in  the hospital is up to his oncologist the patient like to have a meeting with the family members, oncologist and hospitalist tomorrow discussed his treatment option.  He is willing to go skilled nursing facility if he decided that his best in his favor.  However he feels that he should have more treatment since he has seen mild improvement.  Patient denies any hallucination, paranoid, suicidal thoughts or homicidal thoughts.  He understand his medical illness.  He has good family support.  His mother and brother lives in [place].  Patient told that he was living in [place] and wanted to get treatment at [facility] because he was promised that he would get better treatment.  He told that he had option to go [place] however he did not provide more details.  Patient appears anxious, tense and emotional.  His speech is fast but coherent.  He mentioned that he does not want to hurt anyone and he is a question and does not have any bad intents.  He wants to get better and he needs clarification from his physicians. HPI Elements:   Location:  Medical floor. Quality:  Fair. Severity:  Mild.  Past Psychiatric History: Past Medical History  Diagnosis Date  . Cancer     Thyroid  . Thyroid disease   . Hypertension     reports that he quit smoking about 2 years ago. His smoking use included Cigarettes. He smoked 0.00 packs per day for 10 years. He  has never used smokeless tobacco. He reports that he drinks about 4.8 ounces of alcohol per week. He reports that he uses illicit drugs (Marijuana). History reviewed. No pertinent family history.   Living Arrangements: Parent (mom)   Abuse/Neglect [facility]) Physical Abuse: Denies Verbal Abuse: Denies Sexual Abuse: Denies Allergies:  No Known Allergies  ACT Assessment Complete:  No:   Past Psychiatric History: Patient has no previous history of psychiatric illness.  He has never taken any psychotropic medication.  He has no family history of  psychiatric illness.  He has no history of impulsive behavior.  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 Education:  Patient has high school education and trade skills Family Supports:  Yes Objective: Blood pressure 127/83, pulse 92, temperature 98 F (36.7 C), temperature source Oral, resp. rate 18, height 6\' 4"  (1.93 m), weight 177 lb 8 oz (80.513 kg), SpO2 96.00%.Body mass index is 21.61 kg/(m^2).No results found for this or any previous visit (from the past 72 hour(s)). Labs are reviewed.  Current Facility-Administered Medications  Medication Dose Route Frequency Provider Last Rate Last Dose  . bisacodyl (DULCOLAX) EC tablet 5 mg  5 mg Oral Daily Thurnell Lose, MD   5 mg at 09/08/13 0945  . dexamethasone (DECADRON) tablet 4 mg  4 mg Oral Q6H Nishant Dhungel, MD   4 mg at 09/08/13 0631  . docusate sodium (COLACE) capsule 100 mg  100 mg Oral BID Nishant Dhungel, MD   100 mg at 09/08/13 0944  . guaiFENesin-dextromethorphan (ROBITUSSIN DM) 100-10 MG/5ML syrup 5 mL  5 mL Oral Q4H PRN Thurnell Lose, MD      . heparin injection 5,000 Units  5,000 Units Subcutaneous Q8H Thurnell Lose, MD   5,000 Units at 09/01/13 2129  . HYDROcodone-acetaminophen (NORCO/VICODIN) 5-325 MG per tablet 2 tablet  2 tablet Oral Q4H PRN Louellen Molder, MD   2 tablet at 09/08/13 0935  . levothyroxine (SYNTHROID, LEVOTHROID) tablet 100 mcg  100 mcg Oral QAC breakfast Thurnell Lose, MD   100 mcg at 09/08/13 0819  . morphine 2 MG/ML injection 2 mg  2 mg Intravenous Q3H PRN Nishant Dhungel, MD   2 mg at 09/08/13 0828  . multivitamin with minerals tablet 1 tablet  1 tablet Oral Daily Thurnell Lose, MD   1 tablet at 09/08/13 0945  . ondansetron (ZOFRAN) tablet 4 mg  4 mg Oral Q6H PRN Thurnell Lose, MD       Or  . ondansetron (ZOFRAN) injection 4 mg  4 mg Intravenous Q6H PRN Thurnell Lose, MD      .  pantoprazole (PROTONIX) EC tablet 40 mg  40 mg Oral Daily Nishant Dhungel, MD   40 mg at 09/07/13 1046  . polyethylene glycol (MIRALAX / GLYCOLAX) packet 17 g  17 g Oral Daily PRN Thurnell Lose, MD   17 g at 09/07/13 2343  . pregabalin (LYRICA) capsule 50 mg  50 mg Oral Daily Nishant Dhungel, MD   50 mg at 09/08/13 0945    Psychiatric Specialty Exam:     Blood pressure 127/83, pulse 92, temperature 98 F (36.7 C), temperature source Oral, resp. rate 18, height 6\' 4"  (1.93 m), weight 177 lb 8 oz (80.513 kg), SpO2 96.00%.Body mass index is 21.61 kg/(m^2).  General Appearance: Casual and Guarded  Eye Contact::  Fair  Speech:  Pressured and fast and coherent  Volume:  Increased  Mood:  Anxious and Irritable  Affect:  Appropriate and Constricted  Thought Process:  Goal Directed and Logical  Orientation:  Full (Time, Place, and Person)  Thought Content:  Preoccupied with his illness  Suicidal Thoughts:  No  Homicidal Thoughts:  No  Memory:  Immediate;   Good Recent;   Good Remote;   Good  Judgement:  Fair  Insight:  Fair  Psychomotor Activity:  Increased  Concentration:  Fair  Recall:  Good  Akathisia:  No  Handed:  Right  AIMS (if indicated):     Assets:  Communication Skills Desire for Improvement Housing Social Support  Sleep:      Treatment Plan Summary: The patient does not require inpatient psychiatric services. The patient has capacity to participate in his treatment plan.  He requires clarification from his provider including his oncologist in treatment option.  He appears anxious but cooperative.  He wants his provider and his family member physically there when he was told about treatment option .    Frank Ward T. 09/08/2013 2:01 PM

## 2013-09-08 NOTE — Progress Notes (Signed)
[facility] PROGRESS NOTE  Frank Ward DPO:[id number] DOB: 07/18/1971 DOA: 08/28/2013 PCP: No primary provider on file.  Assessment/Plan: Aggressive thyroid cancer with widespread metastases  -Patient noted for lung and T-spine metastases previously and now has metastases to the cervical spine and brain with C3-C4 fracture. .As per his previous oncologist in [facility] Dr Lajean Silvius, patient did not continue further care since August of 2014.  -Continue supportive care with IV hydration, pain control and antiemetics . -Continue Synthroid.  -Cont decadron 4 mg q 6hr with taper per Oncology recs.  -oncology consult appreciated. Recommended radiation onc for brain radiation.  - now undergoing radiation tx.  - Pt was concerned about colon ca. CT abd/pelvis was ordered - showing large amounts of stool. No evidence of colon Ca - Discussed case with Dr. Juliann Mule and currently agrees that patient can continue  radiation tx as an outpatient  - Social worker on board to help set up follow up appointments as outpatient. Reportedly patient to stay with cousin once discharged. I have discussed disposition options with patient. Currently he is requesting inpatient hospital stay for the duration of his recommended radiation treatments. We'll discuss with radiation oncologist next a.m. to see if he recommends inpatient hospitalization for this. From my standpoint patient can transition to skilled nursing facility if significantly debilitated (physical therapy evaluation pending) otherwise as mentioned above patient can live with cousin who has agreed and plans can be made for transportation to radiation therapy. - Psychiatry consult at for medical capacity. Patient has medical capacity to make his own decisions  - Currently. Patient is medically stable and we are awaiting disposition plans. Complicated in that patient is refusing disposition options.  Dizziness  - Likely secondary to his brain metastases.   -Stable now. Carotid Doppler without significant stenosis. No issues on tele  - Patient refusing physical therapy  Acute kidney injury  Improved with hydration. Now off fluids   Code Status: Full Family Communication: No family at bedside Disposition Plan: Plan will be to have radiation oncologist meet with patient and ideally with family in room so that plans can be made regarding disposition.   Consultants:  Radiation oncology  Neurosurgery  HPI/Subjective: Patient would like to stay in the hospital for the course of 10 radiation therapies planned. I have had discussion with patient stating that despite the fact that it is convenient for him if he does not meet medical criteria to stay in the hospital that we will have to arrange followup either through skilled nursing facility.  The patient insisted that he wanted to record her conversation today. The disposition was discussed with patient with psychiatrists present. Patient understands disposition options and would like to have his family present as well as further discussion with oncologist. I have explained that radiation therapy is to continue even if he is not in the hospital. Patient's states that his concern is that he is unable to care for himself. I have consult at physical therapy but he refused evaluation.   I have recommended the patient participate with physical therapy so that I can help him regarding disposition.  Objective: Filed Vitals:   09/07/13 2119 09/08/13 0619 09/08/13 0656 09/08/13 1500  BP: 127/80 127/83  133/80  Pulse: 76 92  87  Temp: 97.9 F (36.6 C) 98 F (36.7 C)  97.8 F (36.6 C)  TempSrc: Oral Oral  Oral  Resp: 18 18  20   Height:      Weight:   80.513 kg (177 lb 8  oz)   SpO2: 93% 96%  95%    Intake/Output Summary (Last 24 hours) at 09/08/13 1624 Last data filed at 09/08/13 1400  Gross per 24 hour  Intake   1380 ml  Output      0 ml  Net   1380 ml   Filed Weights   09/05/13 0620  09/06/13 0600 09/08/13 0656  Weight: 82.691 kg (182 lb 4.8 oz) 82.056 kg (180 lb 14.4 oz) 80.513 kg (177 lb 8 oz)    Exam:   General:  Awake, in nad, non toxic  Cardiovascular: No cyanotic extremities  Respiratory: normal resp effort, no wheezing, no increased work of breathing  Abdomen: Nondistended, nontender  Musculoskeletal: No cyanosis, no clubbing  Data Reviewed: Basic Metabolic Panel: No results found for this basename: NA, K, CL, CO2, GLUCOSE, BUN, CREATININE, CALCIUM, MG, PHOS,  in the last 168 hours Liver Function Tests: No results found for this basename: AST, ALT, ALKPHOS, BILITOT, PROT, ALBUMIN,  in the last 168 hours No results found for this basename: LIPASE, AMYLASE,  in the last 168 hours No results found for this basename: AMMONIA,  in the last 168 hours CBC: No results found for this basename: WBC, NEUTROABS, HGB, HCT, MCV, PLT,  in the last 168 hours Cardiac Enzymes: No results found for this basename: CKTOTAL, CKMB, CKMBINDEX, TROPONINI,  in the last 168 hours BNP (last 3 results) No results found for this basename: PROBNP,  in the last 8760 hours CBG: No results found for this basename: GLUCAP,  in the last 168 hours  No results found for this or any previous visit (from the past 240 hour(s)).   Studies: No results found. Scheduled Meds: . bisacodyl  5 mg Oral Daily  . dexamethasone  4 mg Oral Q6H  . docusate sodium  100 mg Oral BID  . heparin  5,000 Units Subcutaneous Q8H  . levothyroxine  100 mcg Oral QAC breakfast  . multivitamin with minerals  1 tablet Oral Daily  . pantoprazole  40 mg Oral Daily  . 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

## 2013-09-09 ENCOUNTER — Ambulatory Visit: Payer: Medicaid Other

## 2013-09-09 ENCOUNTER — Encounter: Payer: Self-pay | Admitting: Radiation Oncology

## 2013-09-09 MED ORDER — PREGABALIN 50 MG PO CAPS: 50.0000 mg | ORAL_CAPSULE | Freq: Every day | ORAL | Status: DC

## 2013-09-09 MED ORDER — PANTOPRAZOLE SODIUM 40 MG PO TBEC: 40.0000 mg | DELAYED_RELEASE_TABLET | Freq: Every day | ORAL | Status: DC

## 2013-09-09 MED ORDER — DEXAMETHASONE 4 MG PO TABS: 4.0000 mg | ORAL_TABLET | Freq: Every day | ORAL | Status: DC

## 2013-09-09 MED ORDER — HYDROCODONE-ACETAMINOPHEN 5-325 MG PO TABS: 1.0000 | ORAL_TABLET | ORAL | Status: DC | PRN

## 2013-09-09 NOTE — Evaluation (Signed)
Physical Therapy Evaluation Patient Details Name: Frank Ward MRN: [id number] DOB: 26-Apr-1971 Today's Date: 09/09/2013 Time: 9767-3419 PT Time Calculation (min): 29 min  PT Assessment / Plan / Recommendation History of Present Illness  43 yo male admitted with R shoulder, neck pain. Hx of thyroid cancer with mets to cervical spine (C3, C4), brain, lungs, spinal canal tumor. Left hospital during previous stay AMA. Pt states he is visiting from out of town and does not have anywhere to go.   Clinical Impression  At start of session, reported new sensation of tingling and chest tightness-thinks he had a stroke-pt reports he made RN aware (spoke with RN who stated she was aware). Pt agreeable to participate with PT. On eval, pt required Min guard assist for mobility-able to ambulate ~175 feet without assistive device. Unsteady at times. Pt c/o dizziness throughout session but he was able to tolerate activity. Attempted to discuss d/c plan-pt vacillated between having nowhere to stay to possibly having some arrangements in place (however he would not make those known to therapist). Recommend SNF if pt agreeable. Otherwise, HH if pt will have a place to stay. Pt very upset about the possibility of being discharged before completing radiation treatments-encouraged pt to continue working with MD and SW about this. Will follow during stay as long as pt will participate.   PT Assessment  Patient needs continued PT services    Follow Up Recommendations  SNF;Supervision/Assistance - 24 hour (if pt will agree. Pt states he has no d/c plan. )    Does the patient have the potential to tolerate intense rehabilitation      Barriers to Discharge        Equipment Recommendations  None recommended by PT    Recommendations for Other Services OT consult   Frequency Min 2X/week    Precautions / Restrictions Precautions Precautions: Fall;Cervical Precaution Comments: Educated pt on avoiding excessive head  movement (cervical rotation).  Required Braces or Orthoses:  (Neurosurgery consulted-no brace recommended) Restrictions Weight Bearing Restrictions: No   Pertinent Vitals/Pain 7/10 R arm, R side of neck      Mobility  Bed Mobility Overal bed mobility: Modified Independent General bed mobility comments: HOB elevated. Increased time.  Transfers Overall transfer level: Needs assistance Transfers: Sit to/from Stand Sit to Stand: Supervision General transfer comment: supervison due to pt c/o dizziness. Stood EOB ~1 minute before ambulating. Encouraged pt to stabilize gaze/head and focus on 1 spot/location. Pt also c/o head feeling like "its gonna explode" Ambulation/Gait Ambulation/Gait assistance: Min guard Ambulation Distance (Feet): 175 Feet Assistive device: None Gait Pattern/deviations: Step-through pattern;Narrow base of support General Gait Details: slow gait speed. Unsteady at times with intermittent scissoring. No LOB/fall. Pt continued to c/o dizziness but able to tolerate ambulation.     Exercises     PT Diagnosis: Difficulty walking;Acute pain  PT Problem List: Decreased activity tolerance;Decreased mobility;Pain;Decreased safety awareness;Impaired sensation;Decreased balance PT Treatment Interventions: DME instruction;Gait training;Functional mobility training;Therapeutic activities;Therapeutic exercise;Balance training;Patient/family education     PT Goals(Current goals can be found in the care plan section) Acute Rehab PT Goals Patient Stated Goal: to stay in hospital until he completes his radiation treatments PT Goal Formulation: With patient Time For Goal Achievement: 09/23/13 Potential to Achieve Goals: Fair  Visit Information  Last PT Received On: 09/09/13 Assistance Needed: +1 History of Present Illness: 43 yo male admitted with R shoulder, neck pain. Hx of thyroid cancer with mets to cervical spine (C3, C4), brain, lungs, spinal canal tumor. Left hospital  during previous stay AMA. Pt states he is visiting from out of town and does not have anywhere to go.        Prior [place] expects to be discharged to:: Unsure Living Arrangements: Parent Additional Comments: pt states he is visiting. working on drawing disability/ssi.  Prior Function Level of Independence: Independent Communication Communication: No difficulties    Cognition  Cognition Arousal/Alertness: Awake/alert Behavior During Therapy: WFL for tasks assessed/performed Overall Cognitive Status: Within Functional Limits for tasks assessed    Extremity/Trunk Assessment Upper Extremity Assessment Upper Extremity Assessment: RUE deficits/detail RUE Deficits / Details: Elbow flexion, extension WFL. Grip strength normal. shoulder flex to ~75-80 degrees, painful (did not MMT shoulder). Light touch sensation intact.  RUE: Unable to fully assess due to pain Lower Extremity Assessment Lower Extremity Assessment: Overall WFL for tasks assessed Cervical / Trunk Assessment Cervical / Trunk Assessment: Normal   Balance Balance Overall balance assessment: Needs assistance Sitting-balance support: Feet supported Sitting balance-Leahy Scale: Normal Standing balance support: No upper extremity supported;During functional activity Standing balance-Leahy Scale: Good  End of Session PT - End of Session Equipment Utilized During Treatment: Gait belt Activity Tolerance: Patient tolerated treatment well Patient left: in bed;with call bell/phone within reach Nurse Communication: Mobility status  GP     Weston Anna, MPT Pager: [phone]

## 2013-09-09 NOTE — Progress Notes (Signed)
Clinical Social Work  Per chart review, PT recommending SNF. MD asked that CSW talk with patient regarding DC plans. CSW met with patient and RN at bedside while security was present. Patient upset and reports he does not want to DC. CSW explained options and offered to assist patient. Patient reports he does not have anywhere to stay. CSW offered to assist with SNF placement, homeless shelters, or calling family. Patient became upset and begun cussing at [place]. Patient refusing any assistance from [place]. CSW explained that when patient is DC then he will be left on his own to determine placement. CSW informed MD that patient is refusing assistance.  CSW is signing off.  [place], [place] [phone]

## 2013-09-09 NOTE — Progress Notes (Signed)
Clinical Social Work  MD asked that CSW provide information to family re: transportation needs. CSW gave Medicaid Transportation information to brother who reports he will assist patient as well.  [place], [place] [phone]

## 2013-09-09 NOTE — Progress Notes (Signed)
09/09/13 1200  Clinical Encounter Type  Visited With Health care provider;Patient not available  Visit Type Follow-up  Referral From Chaplain;Nurse;Physician   Attempted follow-up visit per referral from [place], DMin, MDiv and nursing huddle; deferred to Dr Wendee Beavers this morning, and now (per security) RN and oncology MD are with pt.  Spiritual Care remains available for support if appropriate; please page [phone] as needed.  Thank you!  [place], [place]
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
## 2013-09-09 NOTE — Discharge Summary (Signed)
Physician Discharge Summary  Frank Ward [id number] DOB: [birth year] DOA: 08/28/2013  PCP: No primary provider on file.  Admit date: 08/28/2013 Discharge date: 09/09/2013  Time spent: > 35 minutes  Recommendations for Outpatient Follow-up:  1. Patient will need continued Radiation therapy treatments with Dr. [name] 2. Also need follow up with Dr. Juliann Mule for further evaluation and recommendations. 3. Patient refused a SNF should the patient change his mind then please help set this up as outpatient.  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  Filed Weights   09/05/13 0620 09/06/13 0600 09/08/13 0656  Weight: 82.691 kg (182 lb 4.8 oz) 82.056 kg (180 lb 14.4 oz) 80.513 kg (177 lb 8 oz)    History of present illness:  The patient is a 43 year old African American male with diagnosis of thyroid cancer status post partial resection and radiation treatments. He presented to the ER complaining of neck pain upon further imaging studies with MRI of brain and C-spine multiple brain, skull, C3 and C4 (spine) new metastases were found.  Hospital Course:  Principal Problem:   Cervical spine fracture - Will provide symptomatic management. In discharge with pain medication on board. - Secondary to metastatic thyroid cancer most likely. - Physical therapy evaluated patient while in house and did not recommend any equipment. Skilled nursing facility was recommended which patient refuses - There has been many attempts to try to explain to the patient that he will be able to go to a skilled nursing facility of his choice and transportation can be set up for radiation treatments. But the patient refuses and has become agitated. I have explained that he  cannot stay in the hospital as his condition is stable currently the patient became very upset and started using unpleasant language and insulting me on day of discharge. Social worker reviewed options with patient but similarly patient refused and also insulted her.  Active Problems:   Hypertension - Recommended heart healthy diet.    Thyroid disease - Will continue Synthroid at home dose - Patient to continue followup with PCP for further evaluation and recommendations as well as oncologist for further recommendations regarding his ongoing disease    Cancer - Was evaluated by oncology who recommended the following: XRT per Radiation oncology.  -- Decadron per primary team.  -- Evaluate for R epeat Iodide ablation (last received over 90 days ago.) as an outpatient. I will make the follow-up around the end of his XRT to discuss next treatment.   I have decreased patient's Decadron to 4 mg daily. This will be continued as outpatient and patient can followup with oncologist for further recommendations moving forward to  Radiation oncologist Dr. [name] on board while patient in house. He is to continue following up for radiation therapy treatments which per my discussion today 09/09/2013 can be done as outpatient from oncology and radiation oncology standpoint.    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  Procedures:  As listed below  Consultations:  Neurosurgery  Physical therapy  Oncology  Radiation oncologist  Discharge Exam: Filed Vitals:   09/08/13 2114  BP: 126/72  Pulse: 83  Resp: 20    General: Pt agitated, alert and awake Cardiovascular: no cyanosis Respiratory: Reading comfortably on room air, no audible wheezes  Discharge Instructions  Discharge  Orders   Future Appointments Provider Department Dept Phone   09/09/2013 4:25 PM [facility] [phone]   09/10/2013 6:55 PM [facility] [phone]   09/11/2013 6:55 PM [facility] [phone]   09/12/2013 6:45 PM [facility] [phone]   09/13/2013 6:25 PM [facility] [phone]   09/16/2013 6:10 PM [facility] [phone]   09/17/2013 6:10 PM [facility] [phone]   Future Orders Complete By Expires   Diet - low sodium heart healthy  As directed    Discharge instructions  As directed    Comments:     Patient to follow up for routine radiation therapy as indicated by his Radiation oncologist.  Also is to follow up with his oncologist for further evaluation and recommendations.  Is to avoid driving until cleared by his oncologist.  If still feeling dizziness patient is to avoid baths, swimming in [place], or similar situations.   Driving Restrictions  As directed    Comments:     As indicated above. Do not recommend driving until cleared by oncologist.   Increase activity slowly  As directed    Other Restrictions  As directed    Comments:     Avoid driving, swimming in lakes, baths.  Should any other concerns present regarding restriction patient is to contact Dr. Juliann Mule for further recommendations.       Medication List    STOP taking these medications       Acetaminophen-Pamabrom 500-25 MG Tabs     Menthol (Topical Analgesic) 5 % Pads     naproxen sodium 220 MG tablet  Commonly known as:  ANAPROX     phenazopyridine 97 MG tablet  Commonly known as:  PYRIDIUM      TAKE these medications        bisacodyl 5 MG EC tablet  Commonly known as:  DULCOLAX  Take 5 mg by mouth daily.     CALCIUM 500 +D 500-400 MG-UNIT Tabs  Generic drug:  Calcium Carb-Cholecalciferol  Take 2 tablets by mouth 2 (two) times daily.     dexamethasone 4 MG tablet  Commonly known as:  DECADRON  Take 1 tablet (4 mg total) by mouth daily.     HYDROcodone-acetaminophen 5-325 MG per tablet  Commonly known as:  NORCO/VICODIN  Take 1 tablet by mouth every 4 (four) hours as needed for moderate pain.     levothyroxine 100 MCG tablet  Commonly known as:  SYNTHROID, LEVOTHROID  Take 100 mcg by mouth daily before breakfast.     multivitamin with minerals Tabs tablet  Take 1 tablet by mouth daily.     pantoprazole 40 MG tablet  Commonly known as:  PROTONIX  Take 1 tablet (40 mg total) by mouth daily.     pregabalin 50 MG capsule  Commonly known as:  LYRICA  Take 1 capsule (50 mg total) by mouth daily.       No Known Allergies    The results of significant diagnostics from this hospitalization (including imaging, microbiology, ancillary and laboratory) are listed below for reference.  Significant Diagnostic Studies: Mr Kizzie Fantasia Contrast  September 13, 2013   CLINICAL DATA:  Dizziness and arm pain.  Thyroid cancer.  Headaches.  EXAM: MRI HEAD WITHOUT AND WITH CONTRAST  TECHNIQUE: Multiplanar, multiecho pulse sequences of the brain and surrounding structures were obtained without and with intravenous contrast.  CONTRAST:  89mL MULTIHANCE GADOBENATE DIMEGLUMINE 529 MG/ML IV SOLN  COMPARISON:  CT of the cervical spine 07/26/2013. MRI brain without and with contrast 02/05/2013.  FINDINGS: The previously seen fourth ventricular lesion has significantly increased in size. An enhancing interventricular lesion now measures 23 x 18 x 16 mm. There is no associated hydrocephalus. The lesion within the right lateral ventricle is not significantly changed from the previous studies. This lesion is less clearly a metastatic  deposit. There is a new 4 mm parenchymal lesion in the posterior right frontal lobe, likely lung primary motor cortex. No other enhancing parenchymal lesions are evident. Multiple enhancing skull lesions are present. There are no expansile lesions.  No acute infarct or hemorrhage is present. The ventricles are of normal size. No significant extra-axial fluid collection is present.  Flow is present in the major intracranial arteries. The globes and orbits are intact. Small polyps or mucous retention cysts are noted within the maxillary sinuses bilaterally. There is fluid in the nasal lacrimal duct bilaterally. No soft tissue abnormality is present in the duct. Mucosal thickening is noted in the right frontal sinus. There is some fluid in the mastoid air cells bilaterally. No obstructing nasopharyngeal lesion is evident.  IMPRESSION: 1. 23 mm enhancing mass lesion within the fourth ventricle is compatible with interval growth of a interventricular metastasis. 2. New enhancing 4 mm parenchymal lesion in the posterior right frontal lobe is also worrisome for metastasis. 3. Stable intraventricular lesion of the right lateral ventricle is not clearly metastatic deposit. This lesion has been stable at 7 mm over the course of exams. 4. 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 Critical Value/emergent results were called by telephone at the time of interpretation on 13-Sep-2013 at 5:43 PM to Dr. Tanna Furry , who verbally acknowledged these results.   Electronically Signed   By: Lawrence Santiago M.D.   On: 09-13-13 17:43   Mr Cervical Spine W Wo Contrast  09-13-2013   CLINICAL DATA:  Dizziness and arm pain.  Thyroid cancer.  EXAM: MRI CERVICAL SPINE WITHOUT AND WITH CONTRAST  TECHNIQUE: Multiplanar and multiecho pulse sequences of the cervical spine, to include the craniocervical junction and cervicothoracic junction, were obtained according to standard protocol without and with intravenous contrast.   CONTRAST:  17 mL MultiHance  COMPARISON:  CT cervical spine 07/26/2013.  FINDINGS: Pathologic compression fractures are again seen at C3 and C4 with further collapse of both levels. There is significant extraosseous tumor at the C3 level extending into the ventral epidural space, right greater than left. Tumor extends into the right neural foramen at C2-3 and C3-4. There is tumor in the paraspinous space is well. Tumor extends into the posterior elements on the right at C3.  A 22 mm enhancing lesion is noted along the inferior left aspect of the T1 vertebral body. No other significant metastases are evident.  Normal signal is present in the cervical and upper thoracic spinal cord to the lowest imaged level, T2-3. The visualized soft tissues of the neck are otherwise unremarkable.  C2-3:  Tumor infiltration in the right neural foramen.  C3-4: Tumor infiltration is present in the right foramen and ventral epidural space.  C4-5: A leftward disc osteophyte complex is present without significant stenosis.  C5-6: A leftward disc osteophyte complex is present. Asymmetric left-sided uncovertebral spurring results in mild left foraminal stenosis.  C6-7: Mild facet hypertrophy is present. There is no significant stenosis.  C7-T1:  Negative.  IMPRESSION: 1. Progressive pathologic compression fractures at C3 and C4. 2. Extensive extraosseous tumor at the C3 level extends into the C2-3 and C3-4 foramina as well as the ventral epidural space on the right. 3. Extension of tumor into the posterior elements of C3 on the right. 4. 22 mm enhancing lesion along the inferior left aspect of the T1 vertebral body. 5. Mild left foraminal stenosis at C5-6 secondary to a leftward disc osteophyte complex and asymmetric uncovertebral spurring. Critical Value/emergent results were called by telephone at the time of interpretation on 08/28/2013 at 5:42 PM to Dr. Tanna Furry , who verbally acknowledged these results.   Electronically Signed   By:  Lawrence Santiago M.D.   On: 08/28/2013 17:42   Ct Abdomen Pelvis W Contrast  09/05/2013   CLINICAL DATA:  Metastatic thyroid cancer. Radiation therapy ongoing. Abdominal distention.  EXAM: CT ABDOMEN AND PELVIS WITH CONTRAST  TECHNIQUE: Multidetector CT imaging of the abdomen and pelvis was performed using the standard protocol following bolus administration of intravenous contrast.  CONTRAST:  149mL OMNIPAQUE IOHEXOL 300 MG/ML  SOLN  COMPARISON:  DG ABD PORTABLE 1V dated 09/04/2013; CT ASPIRATION dated 01/11/2013  FINDINGS: Numerous large metastases are again noted at both lung bases, grossly unchanged from the recent chest CT. The largest at the right lung base measures 2.2 cm on image 6. The largest in the left lower lobe measures 2.5 cm on image 5. There is no significant pleural or pericardial effusion.  The liver, gallbladder, spleen and pancreas appear normal. There is no adrenal mass. Both kidneys appear normal without evidence of hydronephrosis.  A large amount of stool is present throughout the colon. There is no bowel wall thickening, adenopathy or inflammatory change. There are no significant vascular findings. The urinary bladder and prostate gland appear normal.  Large expansile lytic lesion involving the right sacrum and adjacent iliac bone has enlarged, measuring 5.7 x 4.2 cm on image 55. This demonstrates anterior extension into the right iliacus muscle. A lesion anteriorly in the right iliac bone also appears slightly larger, measuring 2.6 cm on image 65. The right posterior paraspinal mass at T12-L1 has slightly decreased in size. This partially destroys the posterior elements on the right at L2. There is no significant intraspinal extension. The metastasis involving the left aspect of the T12 vertebral body appears slightly progressive. There is a small lytic lesion in the left ischium.  IMPRESSION: 1. Widespread pulmonary metastases are grossly unchanged. 2. Enlarging osseous metastases as  described. The soft tissue component in the posterior right paraspinal region has slightly improved. No epidural tumor identified. 3. No extraosseous abdominal pelvic metastases identified.   Electronically Signed   By: Camie Patience M.D.   On: 09/05/2013 19:50   Dg Abd Portable 1v  09/04/2013   CLINICAL DATA:  Abdominal pain and bloating.  Constipation.  EXAM: PORTABLE ABDOMEN - 1 VIEW  COMPARISON:  [facility] image for abdominal CT scan dated 01/11/2013  FINDINGS: There is fairly extensive stool throughout the nondistended colon but there is no fecal impaction in the rectum. No dilated loops of large or small bowel. No abnormal abdominal calcifications. Osseous structures are essentially normal.  IMPRESSION: Benign appearing abdomen. Moderate amount of stool in  the nondistended colon.   Electronically Signed   By: Rozetta Nunnery M.D.   On: 09/04/2013 16:39    Microbiology: No results found for this or any previous visit (from the past 240 hour(s)).   Labs: Basic Metabolic Panel: No results found for this basename: NA, K, CL, CO2, GLUCOSE, BUN, CREATININE, CALCIUM, MG, PHOS,  in the last 168 hours Liver Function Tests: No results found for this basename: AST, ALT, ALKPHOS, BILITOT, PROT, ALBUMIN,  in the last 168 hours No results found for this basename: LIPASE, AMYLASE,  in the last 168 hours No results found for this basename: AMMONIA,  in the last 168 hours CBC: No results found for this basename: WBC, NEUTROABS, HGB, HCT, MCV, PLT,  in the last 168 hours Cardiac Enzymes: No results found for this basename: CKTOTAL, CKMB, CKMBINDEX, TROPONINI,  in the last 168 hours BNP: BNP (last 3 results) No results found for this basename: PROBNP,  in the last 8760 hours CBG: No results found for this basename: GLUCAP,  in the last 168 hours     Signed:  Velvet Bathe  [facility] 09/09/2013, 12:09 PM

## 2013-09-09 NOTE — Progress Notes (Signed)
Medical Oncology:  I went to speak with Frank Ward this early afternoon around 12:30 pm.  Four security staff was present outside of his room.  I introduced myself to his family including his brother and other family member.  I asked him his permission to speak about his case with members present, including his nurse.  He said yes.  While retracing his hospital course, he interrupted with profanity and stated that he desired not to follow up with my oncology care any further.  He stated refusal to be discharge today.  He also stated that I told him that he would be here for his radiotherapy,  which I did not.  I suggested that he continue to follow-up with Dr. [name] as an outpatient and I would schedule medical oncology follow up with me.  His family members who were present were very supportive of this decision and agreeable to provide transportation back-n-forth to the office.  I have provided my office information with the discharge paperwork and will assist in his medical oncology care if he chooses to continue follow-up with our office.  If he elects further care at another hospital or office, I will help coordinate and forward his oncology history and past reatment.  Concha Norway, MD

## 2013-09-10 ENCOUNTER — Ambulatory Visit: Payer: Medicaid Other

## 2013-09-11 ENCOUNTER — Ambulatory Visit: Payer: Medicaid Other

## 2013-09-12 ENCOUNTER — Emergency Department (HOSPITAL_COMMUNITY)
Admission: EM | Admit: 2013-09-12 | Discharge: 2013-09-12 | Disposition: A | Discharge status: Home / Self Care | Payer: Medicaid Other | Attending: Emergency Medicine | Admitting: Emergency Medicine

## 2013-09-12 ENCOUNTER — Encounter (HOSPITAL_COMMUNITY): Payer: Self-pay | Admitting: Emergency Medicine

## 2013-09-12 ENCOUNTER — Emergency Department (HOSPITAL_COMMUNITY): Payer: Medicaid Other

## 2013-09-12 ENCOUNTER — Ambulatory Visit: Payer: Medicaid Other

## 2013-09-12 DIAGNOSIS — M79601 Pain in right arm: Secondary | ICD-10-CM

## 2013-09-12 DIAGNOSIS — R071 Chest pain on breathing: Secondary | ICD-10-CM | POA: Insufficient documentation

## 2013-09-12 DIAGNOSIS — K5900 Constipation, unspecified: Secondary | ICD-10-CM | POA: Insufficient documentation

## 2013-09-12 DIAGNOSIS — I10 Essential (primary) hypertension: Secondary | ICD-10-CM | POA: Insufficient documentation

## 2013-09-12 DIAGNOSIS — Z79899 Other long term (current) drug therapy: Secondary | ICD-10-CM | POA: Insufficient documentation

## 2013-09-12 DIAGNOSIS — C7952 Secondary malignant neoplasm of bone marrow: Secondary | ICD-10-CM

## 2013-09-12 DIAGNOSIS — Z9889 Other specified postprocedural states: Secondary | ICD-10-CM | POA: Insufficient documentation

## 2013-09-12 DIAGNOSIS — G893 Neoplasm related pain (acute) (chronic): Secondary | ICD-10-CM | POA: Insufficient documentation

## 2013-09-12 DIAGNOSIS — Z85850 Personal history of malignant neoplasm of thyroid: Secondary | ICD-10-CM | POA: Insufficient documentation

## 2013-09-12 DIAGNOSIS — R209 Unspecified disturbances of skin sensation: Secondary | ICD-10-CM | POA: Insufficient documentation

## 2013-09-12 DIAGNOSIS — C7951 Secondary malignant neoplasm of bone: Secondary | ICD-10-CM | POA: Insufficient documentation

## 2013-09-12 DIAGNOSIS — M542 Cervicalgia: Secondary | ICD-10-CM | POA: Insufficient documentation

## 2013-09-12 DIAGNOSIS — IMO0002 Reserved for concepts with insufficient information to code with codable children: Secondary | ICD-10-CM | POA: Insufficient documentation

## 2013-09-12 DIAGNOSIS — C7949 Secondary malignant neoplasm of other parts of nervous system: Secondary | ICD-10-CM

## 2013-09-12 DIAGNOSIS — C7931 Secondary malignant neoplasm of brain: Secondary | ICD-10-CM | POA: Insufficient documentation

## 2013-09-12 DIAGNOSIS — Z87891 Personal history of nicotine dependence: Secondary | ICD-10-CM | POA: Insufficient documentation

## 2013-09-12 DIAGNOSIS — C7800 Secondary malignant neoplasm of unspecified lung: Secondary | ICD-10-CM | POA: Insufficient documentation

## 2013-09-12 LAB — COMPREHENSIVE METABOLIC PANEL
ALT: 35 U/L (ref 0–53)
AST: 17 U/L (ref 0–37)
Albumin: 3.4 g/dL — ABNORMAL LOW (ref 3.5–5.2)
Alkaline Phosphatase: 100 U/L (ref 39–117)
BUN: 12 mg/dL (ref 6–23)
CO2: 27 mEq/L (ref 19–32)
Calcium: 9.1 mg/dL (ref 8.4–10.5)
Chloride: 99 mEq/L (ref 96–112)
Creatinine, Ser: 0.8 mg/dL (ref 0.50–1.35)
GFR calc Af Amer: >90 mL/min (ref >90)
GFR calc non Af Amer: >90 mL/min (ref >90)
Glucose, Bld: 176 mg/dL — ABNORMAL HIGH (ref 70–99)
Potassium: 4.2 mEq/L (ref 3.7–5.3)
Sodium: 139 mEq/L (ref 137–147)
Total Bilirubin: 0.3 mg/dL (ref 0.3–1.2)
Total Protein: 6.6 g/dL (ref 6.0–8.3)

## 2013-09-12 LAB — CBC
HCT: 38.4 % — ABNORMAL LOW (ref 39.0–52.0)
Hemoglobin: 13.3 g/dL (ref 13.0–17.0)
MCH: 32.8 pg (ref 26.0–34.0)
MCHC: 34.6 g/dL (ref 30.0–36.0)
MCV: 94.8 fL (ref 78.0–100.0)
Platelets: 291 10*3/uL (ref 150–400)
RBC: 4.05 MIL/uL — ABNORMAL LOW (ref 4.22–5.81)
RDW: 13.1 % (ref 11.5–15.5)
WBC: 17.2 10*3/uL — ABNORMAL HIGH (ref 4.0–10.5)

## 2013-09-12 LAB — POCT I-STAT TROPONIN I: Troponin i, poc: 0.01 ng/mL (ref 0.00–0.08)

## 2013-09-12 MED ORDER — ONDANSETRON HCL 4 MG/2ML IJ SOLN
4.0000 mg | Freq: Once | INTRAMUSCULAR | Status: AC
  Administered 2013-09-12: 4 mg via INTRAVENOUS
  Filled 2013-09-12: qty 2

## 2013-09-12 MED ORDER — HYDROCODONE-ACETAMINOPHEN 5-325 MG PO TABS
1.0000 | ORAL_TABLET | ORAL | Status: DC | PRN
Start: 2013-09-12 — End: 2013-09-17

## 2013-09-12 MED ORDER — HYDROMORPHONE HCL PF 1 MG/ML IJ SOLN
1.0000 mg | Freq: Once | INTRAMUSCULAR | Status: AC
  Administered 2013-09-12: 1 mg via INTRAVENOUS
  Filled 2013-09-12: qty 1

## 2013-09-12 MED ORDER — SODIUM CHLORIDE 0.9 % IV BOLUS (SEPSIS)
1000.0000 mL | Freq: Once | INTRAVENOUS | Status: AC
  Administered 2013-09-12: 1000 mL via INTRAVENOUS

## 2013-09-12 NOTE — Discharge Instructions (Signed)
Please take pain medication as needed for your pain.  Continue to follow up for your radiation as it will help with your cancer related pain.  If you are not satisfy with your current cancer care, you may discussed with your oncologist and request transfer of care to another provider.  Return if you have any concerns.

## 2013-09-12 NOTE — ED Provider Notes (Signed)
CSN: [id number]     Arrival date & time 09/12/13  1437 History   First MD Initiated Contact with Patient 09/12/13 1526     Chief Complaint  Patient presents with  . Chest Pain  . Neck Pain   (Consider location/radiation/quality/duration/timing/severity/associated sxs/prior Treatment) HPI 43 year old male with history of metastatic thyroid cancer including bones and lungs presents for evaluations of chest and neck pain.  Patient reports 2 weeks ago he came to the ER complaining of neck pain. He was found to have several cervical fractures along with worsening metastatic cancer to his brain, lung. He was told that he will receive 10 radiation treatments in the hospital. States after 30 radiation treatments he was set up for outpt radiation treatment, which patient reports difficulty following up as scheduled.  For the past 4 days he is experiencing increase pain to R side of neck, intermittent tingling sensation to R shoulder with sharp shooting pain down his R arm, having cough productive with green sputum along with pleuritic CP which he worries about possible pneumonia.  Sts he feels that he need to have inpt radiation treatment as it helps with his cancer pain.  Report having constipation from taking pain meds.  Currently taking steroid that was prescribed.  Pt denies DOE, heart palpitation, n/v/d, abd pain, numbness or weakness.  Report his vision has improved.            Past Medical History  Diagnosis Date  . Cancer     Thyroid  . Thyroid disease   . Hypertension    Past Surgical History  Procedure Laterality Date  . Tendon repair    . Thyroidectomy     No family history on file. History  Substance Use Topics  . Smoking status: Former Smoker -- 10 years    Types: Cigarettes    Quit date: 08/29/2011  . Smokeless tobacco: Never Used  . Alcohol Use: 4.8 oz/week    8 Cans of beer per week     Comment: stopped in approx Dec. 2013    Review of Systems  All other systems  reviewed and are negative.    Allergies  Review of patient's allergies indicates no known allergies.  Home Medications   Current Outpatient Rx  Name  Route  Sig  Dispense  Refill  . bisacodyl (DULCOLAX) 5 MG EC tablet   Oral   Take 5 mg by mouth 2 (two) times daily.          . Calcium Carb-Cholecalciferol (CALCIUM 500 +D) 500-400 MG-UNIT TABS   Oral   Take 1 tablet by mouth daily.          Marland Kitchen dexamethasone (DECADRON) 4 MG tablet   Oral   Take 4 mg by mouth 3 (three) times daily.         Marland Kitchen HYDROcodone-acetaminophen (NORCO/VICODIN) 5-325 MG per tablet   Oral   Take 1 tablet by mouth every 4 (four) hours as needed for moderate pain.   10 tablet   0   . levothyroxine (SYNTHROID, LEVOTHROID) 100 MCG tablet   Oral   Take 100 mcg by mouth daily before breakfast.         . Multiple Vitamin (MULTIVITAMIN WITH MINERALS) TABS tablet   Oral   Take 1 tablet by mouth daily.         . pantoprazole (PROTONIX) 40 MG tablet   Oral   Take 1 tablet (40 mg total) by mouth daily.   30 tablet  0   . pregabalin (LYRICA) 50 MG capsule   Oral   Take 1 capsule (50 mg total) by mouth daily.   30 capsule   0    BP 156/105  Pulse 93  Temp(Src) 98.2 F (36.8 C) (Oral)  Resp 16  Ht 6\' 4"  (1.93 m)  Wt 185 lb 6.4 oz (84.097 kg)  BMI 22.58 kg/m2  SpO2 98% Physical Exam  Constitutional: He appears well-developed and well-nourished. No distress.  HENT:  Head: Atraumatic.  Eyes: Conjunctivae are normal.  Neck: Normal range of motion. Neck supple.  Cardiovascular: Normal rate and regular rhythm.   Pulmonary/Chest: Effort normal and breath sounds normal. He has no wheezes. He has no rales.  mild rhonchi heard  Abdominal: Soft. There is no tenderness.  Musculoskeletal: He exhibits tenderness (tenderness to R paracervical region and along R shoulder and arm without focal point tenderness.  Limited active ROM to R arm but normal grip strength, radial pulse 2+, no deformity.).   Neurological: He is alert.  5/5 strength to all 4 extremities.    Walks with a cane  Skin: No rash noted.  Psychiatric: He has a normal mood and affect.    ED Course  Procedures (including critical care time)   Date: 09/12/2013  Rate: 87  Rhythm: normal sinus rhythm  QRS Axis: right  Intervals: normal  ST/T Wave abnormalities: nonspecific ST/T changes  Conduction Disutrbances:none  Narrative Interpretation:   Old EKG Reviewed: unchanged  4:07 PM Patient here with intermittent tingling sensation to his right shoulder and arm along with right neck pain. He has a cervical fracture secondary to malignancy. He has no obvious focal neuro deficit on exam. Do not think patient has spinal cord compression at this time. He does complain of cough and pleuritic chest pain. No pain symptoms and exertion concerning for PE. Will obtain chest x-ray to rule out pneumonia.   patient has a leukocytosis with WBC 17.2. I suspect is secondary to steroid use.  EKG without acute ischemic changes.    7:23 PM CXR without acute infiltrates to suggest pna.  Metastatic disease noted.  Care discussed with Dr. Wilson Singer who has evaluate pt and felt case management may be helpful in providing outpt support so that pt can continue with his radiation treatment for his pain.  Doubt active cord compression causing his sxs.  Pt voice request wanting to be transferred to [facility] or [facility] for further management of his cancer care.  We recommend pt to voice his wishes with his oncologist for transfer of care.  At this time, we believe pt will need supportive care and cancer treatment outpt.    7:42 PM Pt request to leave prior to be seen by case management.  He will follow up with his provider tomorrow as previously scheduled.  I recommend f/u for radiation treatment as it will help with his cancer related pain.  Pain medication refilled as pt requested.  Return precaution discussed.  Pt otherwise stable for discharge.     Labs Review Labs Reviewed  CBC - Abnormal; Notable for the following:    WBC 17.2 (*)    RBC 4.05 (*)    HCT 38.4 (*)    All other components within normal limits  COMPREHENSIVE METABOLIC PANEL - Abnormal; Notable for the following:    Glucose, Bld 176 (*)    Albumin 3.4 (*)    All other components within normal limits  POCT I-STAT TROPONIN I   Imaging Review Dg  Chest 2 View  09/12/2013   CLINICAL DATA:  Metastatic thyroid cancer. Chest pain, rule out infection.  EXAM: CHEST  2 VIEW  COMPARISON:  Chest radiograph February 07, 2013 and CT of the chest July 26, 2013  FINDINGS: Widely metastatic lung nodules again seen, no pleural effusions or focal consolidations. No pneumothorax.  Lytic lesion in the right scapula progressed from prior radiograph though, was present on recent CT. Suspected thoracic osseous metastasis with mild dextroscoliosis. Soft tissue planes are nonsuspicious.  IMPRESSION: Diffuse metastatic disease without acute cardiopulmonary process.   Electronically Signed   By: Elon Alas   On: 09/12/2013 17:36    EKG Interpretation   None       MDM   1. Cancer related pain   2. Neck pain on right side   3. Right arm pain    BP 142/90  Pulse 96  Temp(Src) 98.2 F (36.8 C) (Oral)  Resp 15  Ht 6\' 4"  (1.93 m)  Wt 185 lb 6.4 oz (84.097 kg)  BMI 22.58 kg/m2  SpO2 96%  I have reviewed nursing notes and vital signs. I personally reviewed the imaging tests through PACS system  I reviewed available ER/hospitalization records thought the EMR     Domenic Moras, [place] 09/12/13 1944

## 2013-09-12 NOTE — ED Notes (Signed)
Pt states for the last month hes had pain in his right side of his neck running down his shoulder. States its gotten progressively worse the last 4 days. Ambulatory to room, states pain is worse with palpation and movement. Pt has full ROM but hurts too much to raise his right shoulder. Pt is AAOx4. No neuro deficits. Has dry cough.

## 2013-09-12 NOTE — ED Notes (Signed)
Pt comfortable with d/c and f/u instructions. Prescriptions x1. Pt given Care Management Phone number.

## 2013-09-12 NOTE — ED Notes (Signed)
Pt states does not want to wait for Care Management. Pt given Care Management number. Gertie Fey PA made aware

## 2013-09-12 NOTE — ED Notes (Signed)
Pt reports pain to left side of body and left side of neck x 4 days. Recently discharged from [facility] on Monday. Pt states he is a stage 4 thyroid cancer patient. Pt has been receiving radiation treatments. Also reports he is constipated, no BM in 2-3 days.

## 2013-09-12 NOTE — ED Provider Notes (Signed)
Medical screening examination/treatment/procedure(s) were performed by non-physician practitioner and as supervising physician I was immediately available for consultation/collaboration.  EKG Interpretation   None       43 year old male presenting with right neck pain. Patient was recently admitted to the hospital for the same and discharged on Monday. 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 Patient was evaluated by neurosurgery and deemed conservative management would be the best approach at this time. Evaluated by oncology. Radiation treatments were initiated. After discharge the plan was for continued radiation as an outpatient.   Difficult situation. Patient seems to have the expectation that he needs to be admitted back to the hospital to receive continued treatments. Per review of records, this was an issue during his recent hospitalization as well. Explained to patient multiple times and in many different ways that, although he does have a serious medical condition and needs continued treatment, this is something that can appropriately be done as an outpatient at this time. Patient cites transportation issues. Multiple different people were involved in his care during his last hospitalization. Various options were presented to him. He declined skilled nursing facility placement. "CSW gave Medicaid Transportation information to brother who reports he will assist patient as well." This has not happened either.   He was evaluated by psychiatry during his last admission. He was deemed competent to make medical decisions.   I do not find basis to admit him back to the hospital at this time. There is no medical necessity to transfer him although he is requesting this. Will get care management involved again to hopefully reiterate the plan to him and provide further encouragement and reassurance.  Virgel Manifold, MD 09/16/13 [phone]

## 2013-09-13 ENCOUNTER — Ambulatory Visit: Payer: Medicaid Other | Admitting: Radiation Oncology

## 2013-09-13 ENCOUNTER — Ambulatory Visit: Payer: Medicaid Other

## 2013-09-13 NOTE — ED Provider Notes (Signed)
Medical screening examination/treatment/procedure(s) were conducted as a shared visit with non-physician practitioner(s) and myself.  I personally evaluated the patient during the encounter.  EKG Interpretation   None      See other note.  Virgel Manifold, MD 09/13/13 [phone]

## 2013-09-16 ENCOUNTER — Ambulatory Visit
Admission: RE | Admit: 2013-09-16 | Discharge: 2013-09-16 | Disposition: A | Discharge status: Home / Self Care | Payer: Medicaid Other | Source: Ambulatory Visit | Attending: Radiation Oncology | Admitting: Radiation Oncology

## 2013-09-16 DIAGNOSIS — C7931 Secondary malignant neoplasm of brain: Secondary | ICD-10-CM

## 2013-09-16 DIAGNOSIS — C7951 Secondary malignant neoplasm of bone: Secondary | ICD-10-CM | POA: Insufficient documentation

## 2013-09-16 DIAGNOSIS — C7952 Secondary malignant neoplasm of bone marrow: Secondary | ICD-10-CM

## 2013-09-16 DIAGNOSIS — C801 Malignant (primary) neoplasm, unspecified: Secondary | ICD-10-CM

## 2013-09-16 NOTE — Addendum Note (Signed)
Encounter addended by: Marye Round, MD on: 09/16/2013  9:24 PM<BR>     Documentation filed: Notes Section, Problem List

## 2013-09-16 NOTE — Progress Notes (Signed)
The patient was seen today with him complaining of some constipation. He has been using various laxatives. I discussed with him my recommendation for him to use a combination of magnesium citrate, Colace, and fleets enemas if necessary. The patient states that he is being scheduled to be seen by GI medicine. He is interested in getting a colonoscopy for his constipation. I discussed with him the purpose of colonoscopies and also discussed issues such as bowel preparation for such procedures.

## 2013-09-16 NOTE — Progress Notes (Signed)
  Radiation Oncology         [phone] ________________________________  Name: Frank Ward MRN: [id number]  Date: 09/16/2013  DOB: 09/14/1970  Simulation Verification Note   NARRATIVE: The patient was brought to the treatment unit and placed in the planned treatment position. The clinical setup was verified. Then port films were obtained and uploaded to the radiation oncology medical record software.  The treatment beams were carefully compared against the planned radiation fields. The position, location, and shape of the radiation fields was reviewed. The targeted volume of tissue appears to be appropriately covered by the radiation beams. Based on my personal review, I approved the simulation verification. The patient's treatment will proceed as planned.  ________________________________   Jodelle Gross, MD, PhD

## 2013-09-17 ENCOUNTER — Encounter: Payer: Self-pay | Admitting: Radiation Oncology

## 2013-09-17 ENCOUNTER — Ambulatory Visit
Admission: RE | Admit: 2013-09-17 | Discharge: 2013-09-17 | Disposition: A | Discharge status: Home / Self Care | Payer: Medicaid Other | Source: Ambulatory Visit | Attending: Radiation Oncology | Admitting: Radiation Oncology

## 2013-09-17 VITALS — BP 136/87 | HR 109 | Temp 98.3°F | Resp 20 | Wt 184.3 lb

## 2013-09-17 DIAGNOSIS — C7952 Secondary malignant neoplasm of bone marrow: Principal | ICD-10-CM

## 2013-09-17 DIAGNOSIS — C7951 Secondary malignant neoplasm of bone: Secondary | ICD-10-CM

## 2013-09-17 MED ORDER — DEXAMETHASONE 4 MG PO TABS: 4.0000 mg | ORAL_TABLET | Freq: Three times a day (TID) | ORAL | Status: DC

## 2013-09-17 MED ORDER — LEVOTHYROXINE SODIUM 100 MCG PO TABS: 100.0000 ug | ORAL_TABLET | Freq: Every day | ORAL | Status: DC

## 2013-09-17 MED ORDER — HYDROCODONE-ACETAMINOPHEN 5-325 MG PO TABS
1.0000 | ORAL_TABLET | ORAL | Status: DC | PRN
Start: 2013-09-17 — End: 2013-10-16

## 2013-09-17 NOTE — Progress Notes (Signed)
  Radiation Oncology         [phone] ________________________________  Name: Frank Ward MRN: [id number]  Date: 09/17/2013  DOB: May 20, 1971  Weekly Radiation Therapy Management  Metastatic thyroid cancer  Current Dose: 6 Gy     Planned Dose:  30 Gy Gy  Narrative . . . . . . . . The patient presents for routine under treatment assessment.                                   The patient is without complaint.                                 Set-up films were reviewed.                                 The chart was checked. Physical Findings. . .  weight is 184 lb 4.8 oz (83.598 kg). His oral temperature is 98.3 F (36.8 C). His blood pressure is 136/87 and his pulse is 109. His respiration is 20. . Weight essentially stable.  No significant changes. Impression . . . . . . . The patient is tolerating radiation. Plan . . . . . . . . . . . . Continue treatment as planned. Today are refilled the patient's hydrocodone, thyroid medicine and Decadron.   He will need a Decadron taper at a later date.  ________________________________ -----------------------------------  Blair Promise, PhD, MD

## 2013-09-17 NOTE — Progress Notes (Addendum)
Weekly rad tcs, 5 txs to brain c=7=t=3, and rt shoulder , pain is a 7 on 1-10 scale, took aleve this am, pt denies nause, blurred vision, takes decadron 4mg  daily, needs refill on decadron, levothyroxine 180mcg, and hydrocodone/apap 5/325mg patient  Stated I have to have the throid medication if i don't i could die" has 8 Dr's stated, patient education done, discusses pain, fatigue, nausea, blurred vision, verbakl understanding, most ly wants thyroid med filled today

## 2013-09-18 ENCOUNTER — Ambulatory Visit
Admission: RE | Admit: 2013-09-18 | Discharge: 2013-09-18 | Disposition: A | Discharge status: Home / Self Care | Payer: Medicaid Other | Source: Ambulatory Visit | Attending: Radiation Oncology | Admitting: Radiation Oncology

## 2013-09-19 ENCOUNTER — Ambulatory Visit
Admission: RE | Admit: 2013-09-19 | Discharge: 2013-09-19 | Disposition: A | Discharge status: Home / Self Care | Payer: Medicaid Other | Source: Ambulatory Visit | Attending: Radiation Oncology | Admitting: Radiation Oncology

## 2013-09-20 ENCOUNTER — Ambulatory Visit
Admission: RE | Admit: 2013-09-20 | Discharge: 2013-09-20 | Disposition: A | Discharge status: Home / Self Care | Payer: Medicaid Other | Source: Ambulatory Visit | Attending: Radiation Oncology | Admitting: Radiation Oncology

## 2013-09-20 ENCOUNTER — Ambulatory Visit: Payer: Medicaid Other

## 2013-09-20 ENCOUNTER — Encounter: Payer: Self-pay | Admitting: Radiation Oncology

## 2013-09-20 VITALS — BP 133/94 | HR 100 | Temp 98.2°F | Resp 20 | Wt 182.0 lb

## 2013-09-20 DIAGNOSIS — C7951 Secondary malignant neoplasm of bone: Secondary | ICD-10-CM

## 2013-09-20 DIAGNOSIS — C7952 Secondary malignant neoplasm of bone marrow: Secondary | ICD-10-CM

## 2013-09-20 DIAGNOSIS — C7931 Secondary malignant neoplasm of brain: Secondary | ICD-10-CM

## 2013-09-20 NOTE — Progress Notes (Signed)
Department of Radiation Oncology  Phone:  [phone] Fax:        [phone]  Weekly Treatment Note    Name: Frank Ward Date: 09/20/2013 MRN: [id number] DOB: June 27, 1971   Current dose: 24 Gy  Current fraction: 8   MEDICATIONS: Current Outpatient Prescriptions  Medication Sig Dispense Refill  . aspirin 325 MG tablet Take 325 mg by mouth 2 (two) times daily.      . bisacodyl (DULCOLAX) 5 MG EC tablet Take 5 mg by mouth 2 (two) times daily.       . Calcium Carb-Cholecalciferol (CALCIUM 500 +D) 500-400 MG-UNIT TABS Take 1 tablet by mouth daily.       Marland Kitchen dexamethasone (DECADRON) 4 MG tablet Take 1 tablet (4 mg total) by mouth 3 (three) times daily.  30 tablet  0  . dexamethasone (DECADRON) 4 MG tablet Take 4 mg by mouth daily. Refilled by Dr.kinard 09/17/13      . HYDROcodone-acetaminophen (NORCO/VICODIN) 5-325 MG per tablet Take 1 tablet by mouth every 4 (four) hours as needed for moderate pain.  30 tablet  0  . HYDROcodone-acetaminophen (NORCO/VICODIN) 5-325 MG per tablet Take 1 tablet by mouth every 6 (six) hours as needed for moderate pain. Refilled by Dr.kinard, gave written Rx to patient      . levothyroxine (SYNTHROID, LEVOTHROID) 100 MCG tablet Take 1 tablet (100 mcg total) by mouth daily before breakfast.  30 tablet  0  . levothyroxine (SYNTHROID, LEVOTHROID) 100 MCG tablet Take 100 mcg by mouth daily before breakfast. Refilled by Dr. Sondra Come 09/17/13      . Multiple Vitamin (MULTIVITAMIN WITH MINERALS) TABS tablet Take 1 tablet by mouth daily.      . naproxen sodium (ANAPROX) 220 MG tablet Take 220 mg by mouth as needed.      . pantoprazole (PROTONIX) 40 MG tablet Take 1 tablet (40 mg total) by mouth daily.  30 tablet  0  . pregabalin (LYRICA) 50 MG capsule Take 1 capsule (50 mg total) by mouth daily.  30 capsule  0   No current facility-administered medications for this encounter.     ALLERGIES: Review of patient's allergies indicates not on file.   LABORATORY DATA:    Lab Results  Component Value Date   WBC 17.2* 09/12/2013   HGB 13.3 09/12/2013   HCT 38.4* 09/12/2013   MCV 94.8 09/12/2013   PLT 291 09/12/2013   Lab Results  Component Value Date   NA 139 09/12/2013   K 4.2 09/12/2013   CL 99 09/12/2013   CO2 27 09/12/2013   Lab Results  Component Value Date   ALT 35 09/12/2013   AST 17 09/12/2013   ALKPHOS 100 09/12/2013   BILITOT 0.3 09/12/2013     NARRATIVE: Frank Ward was seen today for weekly treatment management. The chart was checked and the patient's films were reviewed. The patient states that he is doing well with treatment. Some continued pain in the right neck region and right shoulder. The patient is concerned that this is due to an artery being blocked and we discussed this. He has a cold today.  PHYSICAL EXAMINATION: weight is 182 lb (82.555 kg). His oral temperature is 98.2 F (36.8 C). His blood pressure is 133/94 and his pulse is 100. His respiration is 20.      mild radiation change present in terms of skin change.  ASSESSMENT: The patient is doing satisfactorily with treatment.  PLAN: We will continue with the patient's radiation treatment as  planned. I have reviewed the patient's films/imaging. His pain in the right neck region is likely related to the bony disease within the spine and we are also treating the right scapula. His pain is likely a combination of direct effect as well as possible nerve impingement.

## 2013-09-20 NOTE — Progress Notes (Signed)
Weekly rad txs  Brain, C7-T3,rt shoulder, c./o pain behind his right ear neack artea, rt shouylder, and is constipated, did have bm yesterday and today stated,wearing a mask, stated he has a cold doesn't want to give to anyone else, pain level 7 on 1-10 scale, didn't take any pain meds today, and c/o some nausea, nothing taken for that either, takes cough syrup for the cough 5:11 PM

## 2013-09-23 ENCOUNTER — Ambulatory Visit: Payer: Medicaid Other

## 2013-09-23 ENCOUNTER — Ambulatory Visit
Admission: RE | Admit: 2013-09-23 | Discharge: 2013-09-23 | Disposition: A | Discharge status: Home / Self Care | Payer: Medicaid Other | Source: Ambulatory Visit | Attending: Radiation Oncology | Admitting: Radiation Oncology

## 2013-09-24 ENCOUNTER — Ambulatory Visit
Admission: RE | Admit: 2013-09-24 | Discharge: 2013-09-24 | Disposition: A | Discharge status: Home / Self Care | Payer: Medicaid Other | Source: Ambulatory Visit | Attending: Radiation Oncology | Admitting: Radiation Oncology

## 2013-09-25 ENCOUNTER — Telehealth: Payer: Self-pay

## 2013-09-25 ENCOUNTER — Ambulatory Visit: Payer: Medicaid Other

## 2013-09-25 ENCOUNTER — Telehealth: Payer: Self-pay | Admitting: Internal Medicine

## 2013-09-25 ENCOUNTER — Other Ambulatory Visit: Payer: Self-pay | Admitting: Internal Medicine

## 2013-09-25 DIAGNOSIS — C7951 Secondary malignant neoplasm of bone: Secondary | ICD-10-CM

## 2013-09-25 DIAGNOSIS — C7931 Secondary malignant neoplasm of brain: Secondary | ICD-10-CM

## 2013-09-25 DIAGNOSIS — C73 Malignant neoplasm of thyroid gland: Secondary | ICD-10-CM

## 2013-09-25 NOTE — Telephone Encounter (Signed)
Talked to pt and gav him appt for lab and MD tomorrow per MD

## 2013-09-25 NOTE — Telephone Encounter (Signed)
Pt called to schedule a follow up appt from his hospital stay. He is finishing his radiation treatment on Monday 2/16. He said to apologize to Dr Juliann Mule about his behavior in the hospital. Dr Juliann Mule informed and POF sent

## 2013-09-26 ENCOUNTER — Ambulatory Visit: Payer: Self-pay

## 2013-09-26 ENCOUNTER — Other Ambulatory Visit: Payer: Self-pay | Admitting: Internal Medicine

## 2013-09-26 ENCOUNTER — Telehealth: Payer: Self-pay | Admitting: *Deleted

## 2013-09-26 ENCOUNTER — Other Ambulatory Visit: Payer: Self-pay

## 2013-09-26 ENCOUNTER — Other Ambulatory Visit: Payer: Self-pay | Admitting: Radiation Oncology

## 2013-09-26 ENCOUNTER — Ambulatory Visit: Payer: Medicaid Other

## 2013-09-26 DIAGNOSIS — C7951 Secondary malignant neoplasm of bone: Principal | ICD-10-CM

## 2013-09-26 DIAGNOSIS — C73 Malignant neoplasm of thyroid gland: Secondary | ICD-10-CM

## 2013-09-26 MED ORDER — SUCRALFATE 1 G PO TABS: 1.0000 g | ORAL_TABLET | Freq: Four times a day (QID) | ORAL | Status: DC

## 2013-09-26 NOTE — Telephone Encounter (Signed)
sw pt gv appts for 10/01/13 pt informed me that he will be in here today andd will get an avs...td

## 2013-09-27 ENCOUNTER — Telehealth: Payer: Self-pay | Admitting: Internal Medicine

## 2013-09-27 ENCOUNTER — Ambulatory Visit: Payer: Medicaid Other

## 2013-09-27 ENCOUNTER — Ambulatory Visit: Payer: Medicaid Other | Admitting: Radiation Oncology

## 2013-09-27 NOTE — Telephone Encounter (Signed)
I called to determine why patient did not make appointment yesterday.  He states that he was scheduled for radiation oncology at the same time and he will call to reschedule for next Friday.  I told him that I have made an appointment with endocrinology and have facilitated receipt of the whole body scan.   It is set up for [facility] 2/23 thru Fri 27th. at 1pm to arrive at 12:45 each day.  If your office could please inform him of these appointments.  On Mon and Tues Frank Ward will get thyrogen injections in each hip.  This allows him to stay on his Synthroid.  On Wed he will get the radioactive capsule.   On Friday he will come in and that is when we actually do the Whole Body Scan and we will also get Thyroglobin and Antibodies drawn that day as well.   We will further discuss upon his return appointment.

## 2013-09-30 ENCOUNTER — Ambulatory Visit: Payer: Medicaid Other

## 2013-10-01 ENCOUNTER — Ambulatory Visit: Payer: Self-pay

## 2013-10-01 ENCOUNTER — Other Ambulatory Visit: Payer: Self-pay

## 2013-10-01 ENCOUNTER — Ambulatory Visit: Payer: Medicaid Other

## 2013-10-02 ENCOUNTER — Other Ambulatory Visit: Payer: Self-pay | Admitting: Medical Oncology

## 2013-10-02 ENCOUNTER — Ambulatory Visit: Payer: Medicaid Other

## 2013-10-03 ENCOUNTER — Telehealth: Payer: Self-pay | Admitting: Internal Medicine

## 2013-10-03 ENCOUNTER — Ambulatory Visit: Admission: RE | Admit: 2013-10-03 | Payer: Medicaid Other | Source: Ambulatory Visit

## 2013-10-03 ENCOUNTER — Ambulatory Visit: Payer: Medicaid Other

## 2013-10-03 NOTE — Telephone Encounter (Signed)
, °

## 2013-10-07 ENCOUNTER — Encounter (HOSPITAL_COMMUNITY): Admission: RE | Admit: 2013-10-07 | Payer: Medicaid Other | Source: Ambulatory Visit

## 2013-10-07 ENCOUNTER — Encounter (HOSPITAL_COMMUNITY): Payer: Self-pay | Admitting: Emergency Medicine

## 2013-10-07 ENCOUNTER — Emergency Department (HOSPITAL_COMMUNITY)
Admission: EM | Admit: 2013-10-07 | Discharge: 2013-10-07 | Discharge status: Against Medical Advice | Payer: Medicaid Other | Attending: Emergency Medicine | Admitting: Emergency Medicine

## 2013-10-07 ENCOUNTER — Ambulatory Visit: Payer: Medicaid Other

## 2013-10-07 DIAGNOSIS — I10 Essential (primary) hypertension: Secondary | ICD-10-CM | POA: Insufficient documentation

## 2013-10-07 DIAGNOSIS — M542 Cervicalgia: Secondary | ICD-10-CM | POA: Insufficient documentation

## 2013-10-07 NOTE — ED Notes (Signed)
Called patient for reassessment. No answer.

## 2013-10-07 NOTE — ED Notes (Signed)
Called x's 3 without answer
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
## 2013-10-07 NOTE — ED Notes (Signed)
Called for Reassessment x1. Patient did not answer.

## 2013-10-08 ENCOUNTER — Encounter (HOSPITAL_COMMUNITY): Payer: Medicaid Other

## 2013-10-08 ENCOUNTER — Ambulatory Visit: Payer: Medicaid Other

## 2013-10-09 ENCOUNTER — Ambulatory Visit: Payer: Medicaid Other

## 2013-10-09 ENCOUNTER — Encounter (HOSPITAL_COMMUNITY): Payer: Medicaid Other

## 2013-10-10 ENCOUNTER — Ambulatory Visit: Payer: Medicaid Other

## 2013-10-11 ENCOUNTER — Encounter (HOSPITAL_COMMUNITY): Payer: Medicaid Other

## 2013-10-11 ENCOUNTER — Ambulatory Visit: Payer: Medicaid Other

## 2013-10-14 ENCOUNTER — Ambulatory Visit: Payer: Medicaid Other

## 2013-10-15 ENCOUNTER — Ambulatory Visit: Payer: Medicaid Other

## 2013-10-16 ENCOUNTER — Ambulatory Visit: Payer: Self-pay

## 2013-10-16 ENCOUNTER — Emergency Department (HOSPITAL_COMMUNITY)
Admission: EM | Admit: 2013-10-16 | Discharge: 2013-10-16 | Disposition: A | Discharge status: Home / Self Care | Payer: Medicaid Other | Attending: Emergency Medicine | Admitting: Emergency Medicine

## 2013-10-16 ENCOUNTER — Emergency Department (HOSPITAL_COMMUNITY): Payer: Medicaid Other

## 2013-10-16 ENCOUNTER — Ambulatory Visit: Payer: Medicaid Other

## 2013-10-16 ENCOUNTER — Encounter (HOSPITAL_COMMUNITY): Payer: Self-pay | Admitting: Emergency Medicine

## 2013-10-16 ENCOUNTER — Other Ambulatory Visit: Payer: Self-pay

## 2013-10-16 DIAGNOSIS — M542 Cervicalgia: Secondary | ICD-10-CM | POA: Insufficient documentation

## 2013-10-16 DIAGNOSIS — R300 Dysuria: Secondary | ICD-10-CM | POA: Insufficient documentation

## 2013-10-16 DIAGNOSIS — E079 Disorder of thyroid, unspecified: Secondary | ICD-10-CM | POA: Insufficient documentation

## 2013-10-16 DIAGNOSIS — C73 Malignant neoplasm of thyroid gland: Secondary | ICD-10-CM | POA: Insufficient documentation

## 2013-10-16 DIAGNOSIS — N390 Urinary tract infection, site not specified: Secondary | ICD-10-CM | POA: Insufficient documentation

## 2013-10-16 DIAGNOSIS — Z87891 Personal history of nicotine dependence: Secondary | ICD-10-CM | POA: Insufficient documentation

## 2013-10-16 DIAGNOSIS — C7800 Secondary malignant neoplasm of unspecified lung: Secondary | ICD-10-CM | POA: Insufficient documentation

## 2013-10-16 DIAGNOSIS — C7952 Secondary malignant neoplasm of bone marrow: Secondary | ICD-10-CM

## 2013-10-16 DIAGNOSIS — K5900 Constipation, unspecified: Secondary | ICD-10-CM | POA: Insufficient documentation

## 2013-10-16 DIAGNOSIS — R109 Unspecified abdominal pain: Secondary | ICD-10-CM | POA: Insufficient documentation

## 2013-10-16 DIAGNOSIS — M2560 Stiffness of unspecified joint, not elsewhere classified: Secondary | ICD-10-CM

## 2013-10-16 DIAGNOSIS — M2569 Stiffness of other specified joint, not elsewhere classified: Secondary | ICD-10-CM | POA: Insufficient documentation

## 2013-10-16 DIAGNOSIS — F411 Generalized anxiety disorder: Secondary | ICD-10-CM | POA: Insufficient documentation

## 2013-10-16 DIAGNOSIS — C7951 Secondary malignant neoplasm of bone: Secondary | ICD-10-CM | POA: Insufficient documentation

## 2013-10-16 DIAGNOSIS — C7931 Secondary malignant neoplasm of brain: Secondary | ICD-10-CM | POA: Insufficient documentation

## 2013-10-16 DIAGNOSIS — Z79899 Other long term (current) drug therapy: Secondary | ICD-10-CM | POA: Insufficient documentation

## 2013-10-16 DIAGNOSIS — C799 Secondary malignant neoplasm of unspecified site: Secondary | ICD-10-CM

## 2013-10-16 DIAGNOSIS — M6281 Muscle weakness (generalized): Secondary | ICD-10-CM | POA: Insufficient documentation

## 2013-10-16 DIAGNOSIS — R51 Headache: Secondary | ICD-10-CM | POA: Insufficient documentation

## 2013-10-16 DIAGNOSIS — R209 Unspecified disturbances of skin sensation: Secondary | ICD-10-CM | POA: Insufficient documentation

## 2013-10-16 DIAGNOSIS — R000 Tachycardia, unspecified: Secondary | ICD-10-CM | POA: Insufficient documentation

## 2013-10-16 DIAGNOSIS — C7949 Secondary malignant neoplasm of other parts of nervous system: Secondary | ICD-10-CM

## 2013-10-16 DIAGNOSIS — I10 Essential (primary) hypertension: Secondary | ICD-10-CM | POA: Insufficient documentation

## 2013-10-16 DIAGNOSIS — M25519 Pain in unspecified shoulder: Secondary | ICD-10-CM | POA: Insufficient documentation

## 2013-10-16 LAB — CBC
HEMATOCRIT: 39 % (ref 39.0–52.0)
Hemoglobin: 13.1 g/dL (ref 13.0–17.0)
MCH: 31.7 pg (ref 26.0–34.0)
MCHC: 33.6 g/dL (ref 30.0–36.0)
MCV: 94.4 fL (ref 78.0–100.0)
Platelets: 267 10*3/uL (ref 150–400)
RBC: 4.13 MIL/uL — ABNORMAL LOW (ref 4.22–5.81)
RDW: 13.5 % (ref 11.5–15.5)
WBC: 5.3 10*3/uL (ref 4.0–10.5)

## 2013-10-16 LAB — URINE MICROSCOPIC-ADD ON

## 2013-10-16 LAB — COMPREHENSIVE METABOLIC PANEL
ALK PHOS: 69 U/L (ref 39–117)
ALT: 18 U/L (ref 0–53)
AST: 13 U/L (ref 0–37)
Albumin: 3.3 g/dL — ABNORMAL LOW (ref 3.5–5.2)
BUN: 10 mg/dL (ref 6–23)
CALCIUM: 9.3 mg/dL (ref 8.4–10.5)
CO2: 26 meq/L (ref 19–32)
Chloride: 105 mEq/L (ref 96–112)
Creatinine, Ser: 1.35 mg/dL (ref 0.50–1.35)
GFR, EST AFRICAN AMERICAN: 72 mL/min — AB (ref >90)
GFR, EST NON AFRICAN AMERICAN: 62 mL/min — AB (ref >90)
GLUCOSE: 94 mg/dL (ref 70–99)
Potassium: 4 mEq/L (ref 3.7–5.3)
SODIUM: 144 meq/L (ref 137–147)
TOTAL PROTEIN: 6.4 g/dL (ref 6.0–8.3)
Total Bilirubin: 0.2 mg/dL — ABNORMAL LOW (ref 0.3–1.2)

## 2013-10-16 LAB — URINALYSIS, ROUTINE W REFLEX MICROSCOPIC
Glucose, UA: NEGATIVE mg/dL
Hgb urine dipstick: NEGATIVE
KETONES UR: 15 mg/dL — AB
Nitrite: POSITIVE — AB
Protein, ur: NEGATIVE mg/dL
SPECIFIC GRAVITY, URINE: 1.024 (ref 1.005–1.030)
UROBILINOGEN UA: 1 mg/dL (ref 0.0–1.0)
pH: 5 (ref 5.0–8.0)

## 2013-10-16 MED ORDER — LEVOTHYROXINE SODIUM 100 MCG PO TABS: 100.0000 ug | ORAL_TABLET | Freq: Every day | ORAL | Status: DC

## 2013-10-16 MED ORDER — HYDROCODONE-ACETAMINOPHEN 5-325 MG PO TABS: 1.0000 | ORAL_TABLET | ORAL | Status: DC | PRN

## 2013-10-16 MED ORDER — CEPHALEXIN 500 MG PO CAPS: 500.0000 mg | ORAL_CAPSULE | Freq: Four times a day (QID) | ORAL | Status: DC

## 2013-10-16 MED ORDER — DEXAMETHASONE 4 MG PO TABS: 4.0000 mg | ORAL_TABLET | Freq: Three times a day (TID) | ORAL | Status: DC

## 2013-10-16 MED ORDER — DEXAMETHASONE 4 MG PO TABS
4.0000 mg | ORAL_TABLET | Freq: Once | ORAL | Status: AC
  Administered 2013-10-16: 4 mg via ORAL
  Filled 2013-10-16: qty 1

## 2013-10-16 NOTE — ED Notes (Addendum)
Pt with multiple complaints.  Pt initially states he has R neck pain that gets tingly and radiates down R arm.  Then pt states he has R flank pain and may have a problem with his kidneys.  Pt also states his face feels like it gets "contorted".  Pt states these symptoms have been going on for a while.  Pt states these feelings come in waves.  If he is up and moving around it comes every 5 minutes and if he is laying down it is every 15 minutes.  Pt states he has a fractured C1-C3 and an old R shoulder injury.  Pt states he's had surgery on his neck and gets radiation.  Pt states he is scheduled for radiation today.  Pt has hx of metastatic cancer including thyroid, bone, and brain.

## 2013-10-16 NOTE — ED Provider Notes (Signed)
CSN: [id number]     Arrival date & time 10/16/13  1032 History   First MD Initiated Contact with Patient 10/16/13 1134     Chief Complaint  Patient presents with  . 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  He reports Right sided neck pain and right arm pain.  He reports pain increases with movement.  He reports associated weakness in his right arm.  He states he has taken Aleve for the neck pain without full resolution of his symptoms. The pateint also complains of Right flank pain. He states he has chronic constipation.  Last BM was last night. Denies hematuria or dysuria. He reports decreased stream sine a traumatic injury to his genitals, but will not discuss further details. Denies fever or chills.  He reports last radiation treatment was in January and has missed several appointments with his oncologist due to "having too many doctors appointments" and being "overwhelmed".  Currently taking AZO. Oncologist: Dr. Juliann Mule and Dr. Dre[facility])   The history is provided by the patient and medical records. No language interpreter was used.    Past Medical History  Diagnosis Date  . Cancer     Thyroid  . Thyroid disease   . Hypertension    Past Surgical History  Procedure Laterality Date  . Tendon repair    . Thyroidectomy     No family history on file. History  Substance Use Topics  . Smoking status: Former Smoker -- 10 years    Types: Cigarettes    Quit date: 08/29/2011  . Smokeless tobacco: Never Used  . Alcohol Use: 4.8 oz/week    8 Cans of beer per week     Comment: stopped in approx Dec. 2013    Review of Systems  Constitutional:  Negative for fever and chills.  Gastrointestinal: Positive for abdominal pain and constipation. Negative for nausea, vomiting, diarrhea, blood in stool and abdominal distention.  Genitourinary: Positive for dysuria.  Musculoskeletal: Positive for arthralgias, neck pain and neck stiffness. Negative for joint swelling.  Skin: Negative for rash and wound.  Neurological: Positive for weakness, numbness and headaches.      Allergies  Review of patient's allergies indicates no known allergies.  Home Medications   Current Outpatient Rx  Name  Route  Sig  Dispense  Refill  . dexamethasone (DECADRON) 4 MG tablet   Oral   Take 4 mg by mouth 3 (three) times daily.          Marland Kitchen ibuprofen (ADVIL,MOTRIN) 200 MG tablet   Oral   Take 200-400 mg by mouth every 6 (six) hours as needed for mild pain.         Marland Kitchen levothyroxine (SYNTHROID, LEVOTHROID) 100 MCG tablet   Oral   Take 100 mcg by mouth daily before breakfast.          . Multiple Vitamin (MULTIVITAMIN WITH MINERALS) TABS tablet   Oral   Take 1 tablet by mouth daily.          BP 124/99  Pulse 106  Temp(Src) 97.8 F (36.6 C) (Oral)  Resp 18  Ht 6' (1.829 m)  Wt 182 lb (82.555 kg)  BMI 24.68 kg/m2  SpO2 98% Physical Exam  Nursing note and vitals  reviewed. Constitutional: He is oriented to person, place, and time. He appears well-developed and well-nourished. He appears lethargic. No distress.  HENT:  Head: Normocephalic and atraumatic.  Neck: Normal range of motion. Neck supple.  Pt has good active ROM, reports discomfort with movement.  Cardiovascular: Regular rhythm.  Tachycardia present.   Pulses:      Radial pulses are 2+ on the right side, and 2+ on the left side.  Pulmonary/Chest: Effort normal. No respiratory distress. He has no wheezes. He has no rales.  Abdominal: Soft. Normal appearance. There is tenderness. There is no rebound.    Musculoskeletal: Normal range of motion. He exhibits no edema.       Right  shoulder: He exhibits tenderness. He exhibits normal range of motion and normal strength.  Moves all 4 extremities.  Neurological: He is oriented to person, place, and time. He appears lethargic. No sensory deficit. He exhibits normal muscle tone.  Reflex Scores:      Bicep reflexes are 2+ on the right side and 2+ on the left side. Patient is holding a tissue in left hand.  No decrease in sensation to light touch.  Skin: Skin is warm and dry. He is not diaphoretic.  Psychiatric: His behavior is normal. His mood appears anxious. Thought content is paranoid.  Stutters with speech and has occasional word finding.    ED Course  Procedures (including critical care time) Labs Review Labs Reviewed  CBC - Abnormal; Notable for the following:    RBC 4.13 (*)    All other components within normal limits  COMPREHENSIVE METABOLIC PANEL - Abnormal; Notable for the following:    Albumin 3.3 (*)    Total Bilirubin 0.2 (*)    GFR calc non Af Amer 62 (*)    GFR calc Af Amer 72 (*)    All other components within normal limits  URINALYSIS, ROUTINE W REFLEX MICROSCOPIC - Abnormal; Notable for the following:    Color, Urine ORANGE (*)    Bilirubin Urine SMALL (*)    Ketones, ur 15 (*)    Nitrite POSITIVE (*)    Leukocytes, UA SMALL (*)    All other components within normal limits  URINE MICROSCOPIC-ADD ON - Abnormal; Notable for the following:    Squamous Epithelial / LPF FEW (*)    Bacteria, UA FEW (*)    Casts HYALINE CASTS (*)    All other components within normal limits  URINE CULTURE   Imaging Review  DG Cervical Spine Complete (Final result)  Result time: 10/16/13 14:30:12    Final result by Rad Results In Interface (10/16/13 14:30:12)    Narrative:   CLINICAL DATA: Right neck pain, prior cervical spine fracture, history of bone cancer  EXAM: CERVICAL SPINE 4+ VIEWS  COMPARISON: MRI cervical spine dated 08/28/2013  FINDINGS: Stable loss of height of the C3 and C4 vertebral  bodies, corresponding to known pathologic fractures. Underlying lesion is better visualized on prior MRI.  No evidence of acute fracture or dislocation. Dens appears intact. Lateral masses of C1 are symmetric.  Mild to moderate degenerative changes of the upper cervical spine through C4-5.  Mild narrowing of the left C5-6 neural foramen.  Visualized lung apices are clear.  IMPRESSION: Stable pathologic fractures involving the C3 and C4 vertebral bodies.  Underlying lesion is better visualized on prior MRI.  Mild to moderate degenerative changes of the upper cervical spine through C4-5.   Electronically Signed By: Charline Bills M.D. On: 10/16/2013 14:30  DG Abd 1 View (Final result)  Result time: 10/16/13 14:16:33    Final result by Rad Results In Interface (10/16/13 14:16:33)    Narrative:   CLINICAL DATA: Constipation.  EXAM: ABDOMEN - 1 VIEW  COMPARISON: CT abdomen and pelvis 09/05/2013 and single view of the abdomen 09/04/2013.  FINDINGS: Stool burden is markedly decreased since the comparison examination and is unremarkable. No evidence of bowel obstruction. No abnormal abdominal calcification or focal bony abnormality. Multiple pulmonary nodules are seen in the lung bases.  IMPRESSION: Constipation has resolved.  Multiple pulmonary nodules as seen on prior exams.   Electronically Signed By: Inge Rise M.D. On: 10/16/2013 14:16       EKG Interpretation None      MDM   Final diagnoses:  Neck pain  Metastatic cancer  UTI (lower urinary tract infection)   Pt is a poor historian and has very poor insight into his current illness and prognosis. He has also been non-complaint in his radiation therapy. He has a history of metastatic thyroid cancer to bone, brain, lungs.  He complains of worsening neck pain and abdominal pain. Declines pain medication at this time, states he does not want to get "constipated".  EMR shows  MRI 08/28/2013 IMPRESSION: Progressive pathologic compression fractures at C3 and C4. Extensive extraosseous tumor at the C3 level extends into the C2-3 and C3-4 foramina as well as the ventral epidural space on the  right. Extension of tumor into the posterior elements of C3 on the right. 22 mm enhancing lesion along the inferior left aspect of the T1 vertebral body. Mild left foraminal stenosis at C5-6 secondary to a leftward disc osteophyte complex and asymmetric uncovertebral spurring. Discussed patient history and condition with Dr. Betsey Holiday, he advises consulting his oncologist Dr. Juliann Mule for advice on imaging study. Discussed pt history and condition with Dr. Juliann Mule,  He reports the patient was a no-show for a clinic visit today. He advises consult to his Radiation Oncologist. Consult to Radiation Oncologist, Dr. [name], RN reports he is not on-call and available to discuss with my attending.  I stated he asked me to call you for advice on imaging and stated she was unable to help. Discussed with Dr. Betsey Holiday, will order C-spine films given negative neurologic evaluation on exam. Abdominal XR without obstruction. C-spine- shows stable C3 and C4 fractures. UA shows infection, will treat and send for culture. Discussed patient history, condition, and labs with Dr. Betsey Holiday who agrees the patient can be evaluated as an out-pt.  Will refill his dexamethasone, Norco, and synthroid.  Discussed lab results, imaging results, and treatment plan with the patient. Advise patient to reschedule Radiation oncology treatment appointment and to follow up with Dr. Juliann Mule. Return precautions given. Reports understanding and no other concerns at this time.  Patient is stable for discharge at this time.  I personally spent >30 minutes explaining his diagnoses and treatment plan and importance. I'm not sure the patient understands his disease and wants to blame medical professionals for his metastatic disease.   Meds given in  ED:  Medications  dexamethasone (DECADRON) tablet 4 mg (4 mg Oral Given 10/16/13 1415)    Discharge Medication List as of 10/16/2013  3:16 PM    START taking these medications   Details  cephALEXin (KEFLEX) 500 MG capsule Take 1 capsule (500 mg total) by mouth 4 (four) times daily., Starting 10/16/2013, Until Discontinued, Print    !! dexamethasone (DECADRON) 4 MG tablet Take 1 tablet (4 mg total) by  mouth 3 (three) times daily., Starting 10/16/2013, Until Discontinued, Print    HYDROcodone-acetaminophen (NORCO/VICODIN) 5-325 MG per tablet Take 1 tablet by mouth every 4 (four) hours as needed., Starting 10/16/2013, Until Discontinued, Print    !! levothyroxine (SYNTHROID, LEVOTHROID) 100 MCG tablet Take 1 tablet (100 mcg total) by mouth daily before breakfast., Starting 10/16/2013, Until Discontinued, Print     !! - Potential duplicate medications found. Please discuss with provider.         Lorrine Kin, PA-C 10/20/13 [phone]

## 2013-10-16 NOTE — Discharge Instructions (Signed)
Call for a follow up appointment with a Family or Primary Care Provider.  Follow up for your radiation treatments as scheduled.  Call to reschedule your appointment that you missed today. Return if Symptoms worsen.   Take medication as prescribed.

## 2013-10-16 NOTE — ED Notes (Signed)
Pt taken to xray 

## 2013-10-16 NOTE — ED Notes (Signed)
Pt back from x-ray.

## 2013-10-16 NOTE — ED Notes (Signed)
Pt requesting Xrays of his neck and shoulder. Has previous injury to shoulder and neck. Pt also complaining of right flank pain. Pt states flank pain started four days ago. Denies blood in urine or any problems with urinating.

## 2013-10-17 ENCOUNTER — Ambulatory Visit: Payer: Medicaid Other

## 2013-10-17 ENCOUNTER — Telehealth: Payer: Self-pay | Admitting: *Deleted

## 2013-10-17 LAB — URINE CULTURE
Colony Count: NO GROWTH
Culture: NO GROWTH

## 2013-10-17 NOTE — Telephone Encounter (Signed)
Patient returned call statsing he was cancelling todays and

## 2013-10-17 NOTE — Telephone Encounter (Signed)
Left message to cvall back

## 2013-10-17 NOTE — Telephone Encounter (Signed)
Called patient home, to ask if he was going to come for rad tx today, left voice message to call us back 11:29 AM

## 2013-10-17 NOTE — Telephone Encounter (Signed)
done

## 2013-10-17 NOTE — Telephone Encounter (Signed)
Patient returned my call, stated he is cancelling todays and tomorrow rad tx,  y, too constipated, he went to the ED yesterday wasn't admitted refused pain medication, because of constipation,  stated he has a primary MD now and has scheduled a colonoscopy for next week, asked pataient if he was going to come for treatment Monday, he stated yes, will inform Dr.Moody and linac treatment machine therapist 2:34 PM

## 2013-10-18 ENCOUNTER — Ambulatory Visit: Payer: Medicaid Other

## 2013-10-18 ENCOUNTER — Ambulatory Visit: Payer: Medicaid Other | Admitting: Radiation Oncology

## 2013-10-21 ENCOUNTER — Ambulatory Visit: Payer: Medicaid Other

## 2013-10-22 ENCOUNTER — Ambulatory Visit: Payer: Medicaid Other

## 2013-10-22 NOTE — ED Provider Notes (Signed)
Medical screening examination/treatment/procedure(s) were performed by non-physician practitioner and as supervising physician I was immediately available for consultation/collaboration.   EKG Interpretation None        Orpah Greek, MD 10/22/13 2118

## 2013-10-23 ENCOUNTER — Telehealth: Payer: Self-pay | Admitting: *Deleted

## 2013-10-23 ENCOUNTER — Ambulatory Visit: Payer: Medicaid Other

## 2013-10-23 ENCOUNTER — Ambulatory Visit
Admission: RE | Admit: 2013-10-23 | Discharge: 2013-10-23 | Disposition: A | Discharge status: Home / Self Care | Payer: Medicaid Other | Source: Ambulatory Visit | Attending: Radiation Oncology | Admitting: Radiation Oncology

## 2013-10-23 DIAGNOSIS — C7952 Secondary malignant neoplasm of bone marrow: Secondary | ICD-10-CM

## 2013-10-23 DIAGNOSIS — C801 Malignant (primary) neoplasm, unspecified: Secondary | ICD-10-CM

## 2013-10-23 DIAGNOSIS — C7951 Secondary malignant neoplasm of bone: Secondary | ICD-10-CM

## 2013-10-23 DIAGNOSIS — C7931 Secondary malignant neoplasm of brain: Secondary | ICD-10-CM

## 2013-10-23 NOTE — Progress Notes (Signed)
Follow up , last rad txs 09/20/13, c/o 7 on 1-10 scale right neck and shoulders, takes dulcplax prn for constipation, requesting refill on deacdron,  vitasl taken, saw,  [place], Gastroenterologist MD today will call him for colonoscopy  Appetite good, on Keflex from 10/16/13 at d/c from hospital, ct  Results in chart 1:43 PM

## 2013-10-23 NOTE — Progress Notes (Signed)
The patient came in today. He has missed one month of treatment. He indicates that he had a head cold which was a major reason for missing the last several weeks in addition to esophagitis. The patient finished his course of radiation treatment to the brain but only finished 7/10 fractions to the cervical spine and right shoulder.  The patient has had some neck/shoulder pain on an ongoing basis. It was "that this would improve with treatment to the cervical spine region in particular. The patient was seen by surgery and was not felt to be a good candidate.  The patient wanted to discuss his prior treatment in some detail. He was seen by GI medicine today. He has been interested in getting a colonoscopy. This is being set up according to the patient.  A/P:  The patient is a poor historian and it was difficult to stay focused on any one particular issue. He does continue to have pain in the neck/shoulder region. He has been given a refill for Decadron. It is unclear the schedule that he has been on, but I recommended that he stay on a stable dose at this time given his symptoms which has been 3 times per day. He has been given a recent refill for this. I also encouraged him to take pain medicine but the patient has decided not to take strong pain medicine as of yet.  We will obtain repeat imaging of the brain and cervical spine at the 3 month mark and hopefully we can schedule this such that we can review his case in brain/spine conference soon thereafter.

## 2013-10-23 NOTE — Telephone Encounter (Signed)
Called  ,no answer,left voice message for pataient to call and make a follow up appt with Dr.moody, patient has missed too many rad txs , please call [phone] if patient wishes to continue any more rad txs,  8:57 AM

## 2013-10-23 NOTE — Addendum Note (Signed)
Encounter addended by: Marye Round, MD on: 10/23/2013  9:48 PM<BR>     Documentation filed: Clinical Notes

## 2013-10-23 NOTE — Telephone Encounter (Signed)
Patient called back stating he could come in today, tyransferred call to Earleen Newport , follow up appt made for 1:30pm today to see Dr.Moody.now

## 2013-10-24 ENCOUNTER — Other Ambulatory Visit: Payer: Self-pay | Admitting: Radiation Therapy

## 2013-10-24 ENCOUNTER — Encounter: Payer: Self-pay | Admitting: Radiation Oncology

## 2013-10-24 DIAGNOSIS — C7949 Secondary malignant neoplasm of other parts of nervous system: Principal | ICD-10-CM

## 2013-10-24 DIAGNOSIS — C7931 Secondary malignant neoplasm of brain: Secondary | ICD-10-CM

## 2013-10-28 ENCOUNTER — Telehealth: Payer: Self-pay | Admitting: *Deleted

## 2013-10-28 ENCOUNTER — Ambulatory Visit: Payer: Medicaid Other

## 2013-10-28 NOTE — Telephone Encounter (Signed)
Returned call to Frank Ward,left message informing him of his up coming appts, MRI brain 12/09/13 per [facility] protocol, at 1pm, then an MRI of entire c-spine same day at 2pm, and f/u with Dr.Moody on 12/11/13 at 130 to discuss results, patient wanting to discuss appts per message left on my phone 10:22 AM

## 2013-10-28 NOTE — Telephone Encounter (Signed)
Patient called back again after speaking with therapist RT Joellen Jersey, asked me to call and leave a voice message as to why his rad txs are cancelled, for his Primary MD to know, 11:23 AM  Called and left message that patient missed over 1 month of rad txa and rest were cancelled  Due to mising too many treatments, MRI c-spine scheduled for 12/09/13 amd MRI  Brain4/27/15 and f/u with Dr.moody 12/11/13 as previously left same message, patient talks so fast hard to understand him 11:25 AM

## 2013-10-30 ENCOUNTER — Ambulatory Visit: Payer: Medicaid Other

## 2013-11-01 ENCOUNTER — Ambulatory Visit: Payer: Medicaid Other

## 2013-11-05 ENCOUNTER — Ambulatory Visit: Payer: Medicaid Other

## 2013-11-05 NOTE — Progress Notes (Signed)
  Radiation Oncology         [phone] ________________________________  Name: Frank Ward MRN: [id number]  Date: 10/24/2013  DOB: Aug 11, 1971  End of Treatment Note  Diagnosis:   Metastatic thyroid cancer     Indication for treatment:  Palliative       Radiation treatment dates:   09/04/2013 through 09/24/2013  Site/dose:    1. whole brain radiation, upper cervical spine: The patient received 30 gray in 10 fractions at 3 gray per fraction. 2.  right scapular metastasis: The patient was planned to receive 30 gray in 10 fractions. He received a total of 21 gray before discontinuing treatment. 3.  C7 to T3 spine: The patient was planned to receive 30 gray in 10 fractions. He received a total of 21 gray before discontinuing treatment.   Narrative/Plan: The patient tolerated radiation treatment relatively well.  He did experience some esophagitis during his treatment. The patient had some difficulties with other illness and was unable to come to treatment for multiple weeks. I scheduled a followup appointment with the patient and I recommended that we not complete the remaining 3 treatments given his prior dose in the timeframe since he finished his last treatment. Rather, I discussed with him repeat imaging and then deciding how best to proceed in a multidisciplinary fashion. I suggested that he had scans and be placed on our brain/spine conference schedule after an appropriate interval. I would not immediately scan him at this time given the timeframe since his treatment.  At the end of our conversation, the patient agreed with this plan. I will contact our staff to make additional arrangements.  ________________________________  Jodelle Gross, M.D., Ph.D.

## 2013-11-05 NOTE — Progress Notes (Signed)
  Radiation Oncology         [phone] ________________________________  Name: Frank Ward MRN: [id number]  Date: 09/09/2013  DOB: May 24, 1971  SIMULATION AND TREATMENT PLANNING NOTE  DIAGNOSIS:  Metastatic thyroid cancer  Site:   1.  C7-T3 spine 2.  right scapula  NARRATIVE:  The patient was previously brought to the [facility].   Written consent to proceed with treatment was confirmed which was freely given after reviewing the details related to the planned course of therapy had been reviewed with the patient.  Then, the patient was set-up in a stable reproducible  supine position for radiation therapy.  CT images were obtained.     The CT images were loaded into the planning software.  Then the target and avoidance structures were contoured.  Treatment planning then occurred.  The radiation prescription was entered and confirmed.  A total of 4 complex treatment devices were fabricated which relate to the designed radiation treatment fields: The patient will be treated with AP and PA fields to both of the 2 target sites. Each of these customized fields/ complex treatment devices will be used on a daily basis during the radiation course. I have requested : Isodose Plan.   PLAN:  The patient will receive 30 Gy in 10 fractions to both target areas.  ________________________________   Jodelle Gross, MD, PhD

## 2013-12-09 ENCOUNTER — Other Ambulatory Visit: Payer: Medicaid Other

## 2013-12-09 ENCOUNTER — Encounter (HOSPITAL_COMMUNITY): Payer: Self-pay | Admitting: Emergency Medicine

## 2013-12-09 ENCOUNTER — Emergency Department (HOSPITAL_COMMUNITY)
Admission: EM | Admit: 2013-12-09 | Discharge: 2013-12-09 | Discharge status: Against Medical Advice | Payer: Medicaid Other | Attending: Emergency Medicine | Admitting: Emergency Medicine

## 2013-12-09 ENCOUNTER — Emergency Department (HOSPITAL_COMMUNITY): Payer: Medicaid Other

## 2013-12-09 ENCOUNTER — Other Ambulatory Visit: Payer: Self-pay

## 2013-12-09 DIAGNOSIS — C7952 Secondary malignant neoplasm of bone marrow: Secondary | ICD-10-CM

## 2013-12-09 DIAGNOSIS — G893 Neoplasm related pain (acute) (chronic): Secondary | ICD-10-CM | POA: Insufficient documentation

## 2013-12-09 DIAGNOSIS — C7800 Secondary malignant neoplasm of unspecified lung: Secondary | ICD-10-CM | POA: Insufficient documentation

## 2013-12-09 DIAGNOSIS — Z791 Long term (current) use of non-steroidal anti-inflammatories (NSAID): Secondary | ICD-10-CM | POA: Insufficient documentation

## 2013-12-09 DIAGNOSIS — J189 Pneumonia, unspecified organism: Secondary | ICD-10-CM

## 2013-12-09 DIAGNOSIS — C7951 Secondary malignant neoplasm of bone: Secondary | ICD-10-CM | POA: Insufficient documentation

## 2013-12-09 DIAGNOSIS — I10 Essential (primary) hypertension: Secondary | ICD-10-CM | POA: Insufficient documentation

## 2013-12-09 DIAGNOSIS — Z87891 Personal history of nicotine dependence: Secondary | ICD-10-CM | POA: Insufficient documentation

## 2013-12-09 DIAGNOSIS — J159 Unspecified bacterial pneumonia: Secondary | ICD-10-CM | POA: Insufficient documentation

## 2013-12-09 DIAGNOSIS — F411 Generalized anxiety disorder: Secondary | ICD-10-CM | POA: Insufficient documentation

## 2013-12-09 DIAGNOSIS — M25519 Pain in unspecified shoulder: Secondary | ICD-10-CM | POA: Insufficient documentation

## 2013-12-09 DIAGNOSIS — Z9089 Acquired absence of other organs: Secondary | ICD-10-CM | POA: Insufficient documentation

## 2013-12-09 DIAGNOSIS — IMO0002 Reserved for concepts with insufficient information to code with codable children: Secondary | ICD-10-CM | POA: Insufficient documentation

## 2013-12-09 DIAGNOSIS — Z923 Personal history of irradiation: Secondary | ICD-10-CM | POA: Insufficient documentation

## 2013-12-09 DIAGNOSIS — Z79899 Other long term (current) drug therapy: Secondary | ICD-10-CM | POA: Insufficient documentation

## 2013-12-09 DIAGNOSIS — Z85850 Personal history of malignant neoplasm of thyroid: Secondary | ICD-10-CM | POA: Insufficient documentation

## 2013-12-09 LAB — BASIC METABOLIC PANEL
BUN: 13 mg/dL (ref 6–23)
CALCIUM: 9.5 mg/dL (ref 8.4–10.5)
CO2: 21 mEq/L (ref 19–32)
CREATININE: 1.35 mg/dL (ref 0.50–1.35)
Chloride: 99 mEq/L (ref 96–112)
GFR calc Af Amer: 72 mL/min — ABNORMAL LOW (ref >90)
GFR, EST NON AFRICAN AMERICAN: 62 mL/min — AB (ref >90)
Glucose, Bld: 137 mg/dL — ABNORMAL HIGH (ref 70–99)
Potassium: 3.8 mEq/L (ref 3.7–5.3)
Sodium: 137 mEq/L (ref 137–147)

## 2013-12-09 LAB — CBC
HCT: 34.1 % — ABNORMAL LOW (ref 39.0–52.0)
Hemoglobin: 11.7 g/dL — ABNORMAL LOW (ref 13.0–17.0)
MCH: 31.5 pg (ref 26.0–34.0)
MCHC: 34.3 g/dL (ref 30.0–36.0)
MCV: 91.9 fL (ref 78.0–100.0)
Platelets: 492 10*3/uL — ABNORMAL HIGH (ref 150–400)
RBC: 3.71 MIL/uL — ABNORMAL LOW (ref 4.22–5.81)
RDW: 13.9 % (ref 11.5–15.5)
WBC: 12.1 10*3/uL — ABNORMAL HIGH (ref 4.0–10.5)

## 2013-12-09 LAB — I-STAT TROPONIN, ED: Troponin i, poc: 0 ng/mL (ref 0.00–0.08)

## 2013-12-09 LAB — CBG MONITORING, ED: Glucose-Capillary: 113 mg/dL — ABNORMAL HIGH (ref 70–99)

## 2013-12-09 MED ORDER — HYDROMORPHONE HCL PF 1 MG/ML IJ SOLN
1.0000 mg | Freq: Once | INTRAMUSCULAR | Status: DC
  Filled 2013-12-09: qty 1

## 2013-12-09 MED ORDER — OXYCODONE-ACETAMINOPHEN 5-325 MG PO TABS
2.0000 | ORAL_TABLET | Freq: Once | ORAL | Status: AC
Start: 2013-12-09 — End: 2013-12-09
  Administered 2013-12-09: 2 via ORAL
  Filled 2013-12-09: qty 2

## 2013-12-09 MED ORDER — DIPHENHYDRAMINE HCL 50 MG/ML IJ SOLN
25.0000 mg | Freq: Once | INTRAMUSCULAR | Status: DC
  Filled 2013-12-09: qty 1

## 2013-12-09 MED ORDER — METOCLOPRAMIDE HCL 5 MG/ML IJ SOLN
10.0000 mg | Freq: Once | INTRAMUSCULAR | Status: DC
Start: 2013-12-09 — End: 2013-12-09
  Filled 2013-12-09: qty 2

## 2013-12-09 MED ORDER — TRAMADOL HCL 50 MG PO TABS: 50.0000 mg | ORAL_TABLET | Freq: Four times a day (QID) | ORAL | Status: DC | PRN

## 2013-12-09 MED ORDER — LEVOFLOXACIN 750 MG PO TABS: 750.0000 mg | ORAL_TABLET | Freq: Every day | ORAL | Status: DC

## 2013-12-09 MED ORDER — METOCLOPRAMIDE HCL 10 MG PO TABS
10.0000 mg | ORAL_TABLET | Freq: Once | ORAL | Status: DC
Start: 2013-12-09 — End: 2013-12-09
  Filled 2013-12-09: qty 1

## 2013-12-09 MED ORDER — LEVOFLOXACIN 750 MG PO TABS
750.0000 mg | ORAL_TABLET | Freq: Once | ORAL | Status: AC
  Administered 2013-12-09: 750 mg via ORAL
  Filled 2013-12-09: qty 1

## 2013-12-09 NOTE — ED Provider Notes (Signed)
CSN: [id number]     Arrival date & time 12/09/13  1230 History   First MD Initiated Contact with Patient 12/09/13 1340     Chief Complaint  Patient presents with  . Neck Pain  . Shoulder Pain     (Consider location/radiation/quality/duration/timing/severity/associated sxs/prior Treatment) The history is provided by the patient.  Frank Ward is a 43 y.o. 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 Patient has been having neck pain for several months. He finished a course of radiation in January. However, he has been lost to follow up and has been canceling his oncology appointments. He had a doppler of R neck in January that showed stenosis of 30%. He has been having persistent headaches and right sided neck pain. He noticed that the right side of his neck is more swollen. Has known C3 tumor according to the MRI in January. Denies numbness, focal weakness or incontinence. He has intermittent chest pain for several months not worse than usual. Ran out of some meds, hasn't been happy with his oncologist.    Past Medical History  Diagnosis Date  . Cancer     Thyroid  . Thyroid disease   . Hypertension    Past Surgical History  Procedure Laterality Date  . Tendon repair    . Thyroidectomy     No family history on file. History  Substance Use Topics  . Smoking status: Former Smoker -- 10 years    Types: Cigarettes    Quit date: 08/29/2011  . Smokeless tobacco: Never Used  . Alcohol Use: 4.8 oz/week    8 Cans of beer per week     Comment: stopped in approx Dec. 2013    Review of Systems  Musculoskeletal: Positive for neck pain.  All other systems reviewed and are negative.     Allergies  Review of patient's allergies indicates no known allergies.  Home Medications   Prior to Admission medications   Medication Sig Start Date End Date Taking? Authorizing Provider  dexamethasone (DECADRON) 4 MG tablet Take 4 mg by mouth 3  (three) times daily.  09/17/13  Yes Historical Provider, MD  ibuprofen (ADVIL,MOTRIN) 200 MG tablet Take 200-400 mg by mouth every 6 (six) hours as needed for mild pain.   Yes Historical Provider, MD  levothyroxine (SYNTHROID, LEVOTHROID) 100 MCG tablet Take 100 mcg by mouth daily before breakfast.    Yes Historical Provider, MD  magnesium citrate SOLN Take 1 Bottle by mouth as needed for severe constipation.   Yes Historical Provider, MD  Multiple Vitamin (MULTIVITAMIN WITH MINERALS) TABS tablet Take 1 tablet by mouth daily.   Yes Historical Provider, MD  Sennosides (EX-LAX) 15 MG TABS Take 1 tablet by mouth as needed.   Yes Historical Provider, MD  traMADol (ULTRAM) 50 MG tablet Take 50 mg by mouth at bedtime.   Yes Historical Provider, MD   BP 130/87  Pulse 106  Temp(Src) 98.1 F (36.7 C) (Oral)  Resp 13  Ht 6\' 4"  (1.93 m)  Wt 184 lb (83.462 kg)  BMI 22.41 kg/m2  SpO2 98% Physical Exam  Nursing note and vitals reviewed. Constitutional: He is oriented to person, place, and time.  Anxious, uncomfortable   HENT:  Head: Normocephalic and atraumatic.  Mouth/Throat: Oropharynx is clear and moist.  Eyes: Conjunctivae are normal. Pupils are equal, round, and reactive to light.  Neck: Normal range of motion.  R side neck slightly swollen, + tender lymph  nodes. Swollen around R paracervical area   Cardiovascular: Normal rate, regular rhythm and normal heart sounds.   Pulmonary/Chest: Effort normal and breath sounds normal. No respiratory distress. He has no wheezes. He has no rales.  Abdominal: Soft. Bowel sounds are normal. He exhibits no distension. There is no tenderness. There is no rebound and no guarding.  Musculoskeletal: Normal range of motion. He exhibits no edema and no tenderness.  Neurological: He is alert and oriented to person, place, and time. No cranial nerve deficit. Coordination normal.  Nl strength and sensation throughout   Skin: Skin is warm and dry.  Psychiatric: He has  a normal mood and affect. His behavior is normal. Judgment and thought content normal.    ED Course  Procedures (including critical care time) Labs Review Labs Reviewed  CBC - Abnormal; Notable for the following:    WBC 12.1 (*)    RBC 3.71 (*)    Hemoglobin 11.7 (*)    HCT 34.1 (*)    Platelets 492 (*)    All other components within normal limits  BASIC METABOLIC PANEL - Abnormal; Notable for the following:    Glucose, Bld 137 (*)    GFR calc non Af Amer 62 (*)    GFR calc Af Amer 72 (*)    All other components within normal limits  I-STAT TROPOININ, ED  Randolm Idol, ED    Imaging Review Dg Chest 2 View  12/09/2013   CLINICAL DATA:  Right-sided neck pain and numbness. History of metastatic thyroid carcinoma  EXAM: CHEST  2 VIEW  COMPARISON:  Prior radiograph from 09/12/2013.  FINDINGS: Cardiac and mediastinal silhouettes are stable in size and contour, and remain within normal limits.  Multiple bilateral pulmonary nodules are again seen, increased in size as compared to prior study, compatible with progressive disease. More confluent opacity within the left perihilar region may represent superimposed pneumonia. No pulmonary edema or pleural effusion. There is no pneumothorax.  No acute osseous abnormality.  IMPRESSION: 1. Interval worsening in diffuse pulmonary metastases as compared to prior radiograph from 09/12/2013. 2. More confluent opacity within the left perihilar region, which may reflect atelectasis or superimposed infiltrate.   Electronically Signed   By: Jeannine Boga M.D.   On: 12/09/2013 14:54     EKG Interpretation   Date/Time:  Monday December 09 2013 12:37:59 EDT Ventricular Rate:  109 PR Interval:  124 QRS Duration: 96 QT Interval:  326 QTC Calculation: 439 R Axis:   94 Text Interpretation:  Sinus tachycardia Rightward axis Cannot rule out  Inferior infarct , age undetermined T wave abnormality, consider lateral  ischemia Abnormal ECG nonspecific TWI  laterally Confirmed by YAO  MD,  DAVID ([id number]) on 12/09/2013 2:09:42 PM Also confirmed by Darl Householder  MD, Crosby  ([id number])  on 12/09/2013 2:39:46 PM      MDM   Final diagnoses:  None    Frank Ward is a 43 y.o. male here with neck pain, chronic chest pain. No neuro deficit. Will do ct head/neck to assess mets. I think he is likely unhappy with his follow up but doesn't want to go to [facility], [facility], or [facility]. I doubt ACS or PE. He has nonspecific EKG changes so will repeat trop but I doubt MI. I told him that he should call cancer center and ask for another oncologist.   3:24 PM CXR showed progressive mets. Also pneumonia. WBC 12. I talked to radiation oncologist, Dr. [name], who suggested more radiation. I planned  on admitting him for pneumonia so he can get more treatments. Dr. [name] also wants MRI brain and MRI cervical spine. Patient doesn't want to stay. Understand risks of signing out AMA including disability and death. I told him to go to [facility]. Will d/c home on levaquin empirically.  Wandra Arthurs, MD 12/09/13 [phone]

## 2013-12-09 NOTE — Discharge Instructions (Signed)
You signed out against medical advice. Go back to [facility] as soon as possible or see your oncologist soon.   Take levaquin as prescribed.   Take tramadol as prescribed. Do NOT drive with it.   Follow up with your doctor.   Return to ER if you have worse chest pain, shortness of breath, fever, neck pain.

## 2013-12-09 NOTE — ED Notes (Signed)
Pt wants to report that he no longer smokes weed.

## 2013-12-09 NOTE — ED Notes (Signed)
Pt reports right neck pain and shoulder pain. Reports he has brain cancer is looking for an oncologist. Also reports occasional gripping at chest. Pt is a x 4. Skin warm and dry. Reports he has a clogged artery in his neck.

## 2013-12-11 ENCOUNTER — Ambulatory Visit: Payer: Medicaid Other | Admitting: Radiation Oncology

## 2013-12-26 ENCOUNTER — Other Ambulatory Visit: Payer: Self-pay | Admitting: Radiation Therapy

## 2013-12-26 ENCOUNTER — Ambulatory Visit
Admission: RE | Admit: 2013-12-26 | Discharge: 2013-12-26 | Disposition: A | Discharge status: Home / Self Care | Payer: Medicaid Other | Source: Ambulatory Visit | Attending: Radiation Oncology | Admitting: Radiation Oncology

## 2013-12-26 DIAGNOSIS — C7931 Secondary malignant neoplasm of brain: Secondary | ICD-10-CM

## 2013-12-26 DIAGNOSIS — C7949 Secondary malignant neoplasm of other parts of nervous system: Principal | ICD-10-CM

## 2013-12-26 MED ORDER — GADOBENATE DIMEGLUMINE 529 MG/ML IV SOLN
17.0000 mL | Freq: Once | INTRAVENOUS | Status: AC | PRN
Start: 2013-12-26 — End: 2013-12-26
  Administered 2013-12-26: 17 mL via INTRAVENOUS

## 2013-12-29 ENCOUNTER — Encounter (HOSPITAL_COMMUNITY): Payer: Self-pay | Admitting: Emergency Medicine

## 2013-12-29 ENCOUNTER — Emergency Department (HOSPITAL_COMMUNITY): Payer: Medicaid Other

## 2013-12-29 ENCOUNTER — Inpatient Hospital Stay (HOSPITAL_COMMUNITY)
Admission: EM | Admit: 2013-12-29 | Discharge: 2014-01-01 | DRG: 948 | Disposition: A | Discharge status: Home Health | Payer: Medicaid Other | Attending: Internal Medicine | Admitting: Internal Medicine

## 2013-12-29 DIAGNOSIS — E079 Disorder of thyroid, unspecified: Secondary | ICD-10-CM

## 2013-12-29 DIAGNOSIS — Z791 Long term (current) use of non-steroidal anti-inflammatories (NSAID): Secondary | ICD-10-CM

## 2013-12-29 DIAGNOSIS — C7931 Secondary malignant neoplasm of brain: Secondary | ICD-10-CM

## 2013-12-29 DIAGNOSIS — C801 Malignant (primary) neoplasm, unspecified: Secondary | ICD-10-CM

## 2013-12-29 DIAGNOSIS — G893 Neoplasm related pain (acute) (chronic): Principal | ICD-10-CM | POA: Diagnosis present

## 2013-12-29 DIAGNOSIS — C7800 Secondary malignant neoplasm of unspecified lung: Secondary | ICD-10-CM | POA: Diagnosis present

## 2013-12-29 DIAGNOSIS — R609 Edema, unspecified: Secondary | ICD-10-CM | POA: Diagnosis present

## 2013-12-29 DIAGNOSIS — Z515 Encounter for palliative care: Secondary | ICD-10-CM

## 2013-12-29 DIAGNOSIS — Z9119 Patient's noncompliance with other medical treatment and regimen: Secondary | ICD-10-CM

## 2013-12-29 DIAGNOSIS — I10 Essential (primary) hypertension: Secondary | ICD-10-CM | POA: Diagnosis present

## 2013-12-29 DIAGNOSIS — R5383 Other fatigue: Secondary | ICD-10-CM

## 2013-12-29 DIAGNOSIS — C7952 Secondary malignant neoplasm of bone marrow: Secondary | ICD-10-CM

## 2013-12-29 DIAGNOSIS — D649 Anemia, unspecified: Secondary | ICD-10-CM | POA: Diagnosis present

## 2013-12-29 DIAGNOSIS — M4802 Spinal stenosis, cervical region: Secondary | ICD-10-CM | POA: Diagnosis present

## 2013-12-29 DIAGNOSIS — C73 Malignant neoplasm of thyroid gland: Secondary | ICD-10-CM | POA: Diagnosis present

## 2013-12-29 DIAGNOSIS — E039 Hypothyroidism, unspecified: Secondary | ICD-10-CM | POA: Diagnosis present

## 2013-12-29 DIAGNOSIS — Z833 Family history of diabetes mellitus: Secondary | ICD-10-CM

## 2013-12-29 DIAGNOSIS — Z9181 History of falling: Secondary | ICD-10-CM

## 2013-12-29 DIAGNOSIS — Z87311 Personal history of (healed) other pathological fracture: Secondary | ICD-10-CM

## 2013-12-29 DIAGNOSIS — C7951 Secondary malignant neoplasm of bone: Secondary | ICD-10-CM | POA: Diagnosis present

## 2013-12-29 DIAGNOSIS — M2040 Other hammer toe(s) (acquired), unspecified foot: Secondary | ICD-10-CM

## 2013-12-29 DIAGNOSIS — R5381 Other malaise: Secondary | ICD-10-CM | POA: Diagnosis present

## 2013-12-29 DIAGNOSIS — R531 Weakness: Secondary | ICD-10-CM

## 2013-12-29 DIAGNOSIS — S129XXA Fracture of neck, unspecified, initial encounter: Secondary | ICD-10-CM

## 2013-12-29 DIAGNOSIS — Z91199 Patient's noncompliance with other medical treatment and regimen due to unspecified reason: Secondary | ICD-10-CM

## 2013-12-29 DIAGNOSIS — Z79899 Other long term (current) drug therapy: Secondary | ICD-10-CM

## 2013-12-29 DIAGNOSIS — Z87891 Personal history of nicotine dependence: Secondary | ICD-10-CM

## 2013-12-29 HISTORY — DX: Pneumonia, unspecified organism: J18.9

## 2013-12-29 MED ORDER — HYDROMORPHONE HCL PF 1 MG/ML IJ SOLN
1.0000 mg | Freq: Once | INTRAMUSCULAR | Status: AC
  Administered 2013-12-29: 1 mg via INTRAMUSCULAR
  Filled 2013-12-29: qty 1

## 2013-12-29 MED ORDER — OXYCODONE-ACETAMINOPHEN 5-325 MG PO TABS
1.0000 | ORAL_TABLET | Freq: Once | ORAL | Status: AC
  Administered 2013-12-29: 1 via ORAL
  Filled 2013-12-29: qty 1

## 2013-12-29 NOTE — ED Provider Notes (Signed)
CSN: [id number]     Arrival date & time 12/29/13  2122 History   First MD Initiated Contact with Patient 12/29/13 2226     Chief Complaint  Patient presents with  . Leg Pain     (Consider location/radiation/quality/duration/timing/severity/associated sxs/prior Treatment) HPI  43 year old male with history of thyroid cancer with metastases to the lung and bone who presents complaining of bilateral leg pain. Patient states for the past three-month he has had persistent pain to the right side of his head radiates down to his neck worsened with movement. He also complaining of a lump to his right hip has been causing pain and has been ongoing for the past month. For the past 3 days he has been having worsening low back pain with bilateral leg pain. Pain worsened with movement or with prolonged standing. States his legs sometimes locked up and gave out on him.  Sts he feel as if he's not getting adequate blood flow to his leg.  He tries to exercise, stretching, drinking plenty of fluid, and taking NSAIDs with minimal relief. He wants to wait to next week before he seek for help with his pain but cannot bear that long. States he had recent MRI of his brain and spine giving his history of cancer but hasn't heard results yet. He does not taking narcotic pain medication and currently not on any specific cancer treatment. He denies fever, chills, productive cough, hemoptysis, shortness of breath, abdominal pain, night sweats, or abnormal weight loss. He reports he has not been receiving adequate care because he lacks financial support. No report of urinary/bowel incontinence or saddle paresthesia.     Past Medical History  Diagnosis Date  . Cancer     Thyroid  . Thyroid disease   . Hypertension   . Pneumonia    Past Surgical History  Procedure Laterality Date  . Tendon repair    . Thyroidectomy     Family History  Problem Relation Age of Onset  . Diabetes Mother   . Diabetes Brother     History  Substance Use Topics  . Smoking status: Former Smoker -- 10 years    Types: Cigarettes    Quit date: 08/29/2011  . Smokeless tobacco: Never Used  . Alcohol Use: 4.8 oz/week    8 Cans of beer per week     Comment: occ    Review of Systems  All other systems reviewed and are negative.     Allergies  Review of patient's allergies indicates no known allergies.  Home Medications   Prior to Admission medications   Medication Sig Start Date End Date Taking? Authorizing Provider  dexamethasone (DECADRON) 4 MG tablet Take 4 mg by mouth 3 (three) times daily.  09/17/13  Yes Historical Provider, MD  ibuprofen (ADVIL,MOTRIN) 200 MG tablet Take 200-400 mg by mouth every 6 (six) hours as needed for mild pain.   Yes Historical Provider, MD  levothyroxine (SYNTHROID, LEVOTHROID) 100 MCG tablet Take 100 mcg by mouth daily before breakfast.    Yes Historical Provider, MD  Multiple Vitamin (MULTIVITAMIN WITH MINERALS) TABS tablet Take 1 tablet by mouth daily.   Yes Historical Provider, MD  traMADol (ULTRAM) 50 MG tablet Take 50 mg by mouth at bedtime as needed for moderate pain.    Yes Historical Provider, MD   BP 136/69  Pulse 114  Temp(Src) 98.2 F (36.8 C) (Oral)  Resp 20  SpO2 96% Physical Exam  Constitutional: He appears well-developed and well-nourished. No distress.  HENT:  Head: Atraumatic.  Eyes: Conjunctivae are normal.  Neck: Normal range of motion. Neck supple.  Cardiovascular: Intact distal pulses.   Musculoskeletal: He exhibits tenderness (Neck: tenderness to midline and R paracervical region, worsening with neck movement.  No overlying skin changes.  Tenderness to upper lumbar midline spine on palpation.  Tenderness to R lateral hip without overlying skin changes.  diffused legs tenderness). He exhibits no edema.  Neurological: He is alert.  Able to ambulate.  NVI.  Skin: No rash noted.  Psychiatric: He has a normal mood and affect.    ED Course  Procedures  (including critical care time)  10:50 PM Patient presents with pain to multiple sites including neck, low back, right hip, bilateral legs. I suspect his pain came from his metastatic cancer. X-ray ordered to rule out pathologic fractures of, suspicion is low. Pain medication given. Patient likely will need further management through his oncologist for this pain. Patient exhibits no symptoms to suggest cauda equina symptoms. No red flags.   12:48 AM Although pain has improved it has not been controlled despite receiving percocet and dilaudid.  Xray did not demonstrates any new pathologic fractures.  Pt is scheduled to be seen by oncologist Dr. [name] tomorrow however he request to be admitted for pain control.  Given his significant hx of metastatic thyroid cancer, i discussed with Dr. Alvino Chapel who agrees pt may benefit from obs for pain control.     1:03 AM I have consulted with [facility], Dr. Hal Hope who agrees to admit pt to obs, med surg, team 8, under his care for pain management.    Labs Review Labs Reviewed  CBC WITH DIFFERENTIAL - Abnormal; Notable for the following:    RBC 3.52 (*)    Hemoglobin 10.9 (*)    HCT 33.6 (*)    RDW 15.6 (*)    Lymphocytes Relative 11 (*)    All other components within normal limits  I-STAT CHEM 8, ED - Abnormal; Notable for the following:    Hemoglobin 11.6 (*)    HCT 34.0 (*)    All other components within normal limits    Imaging Review Dg Cervical Spine Complete  12/29/2013   CLINICAL DATA:  hx cancer, r/o pathologic fx  EXAM: CERVICAL SPINE  4+ VIEWS  COMPARISON:  MR HEAD WO/W CM dated 12/26/2013; DG CHEST 2 VIEW dated 12/09/2013; DG CERVICAL SPINE COMPLETE dated 10/16/2013  FINDINGS: Stable chronic compression fractures involving C4 and C5. Degenerative disc disease changes within the lower cervical spine. No acute osseous abnormalities are appreciated. Stable pathologic fractures involving C4 and C5. No acute osseous abnormalities. There  are areas of degenerative disc disease change within the mid and lower cervical spine.  IMPRESSION: Chronic compression deformities involving C4 and C5. No acute osseous abnormalities.   Electronically Signed   By: Margaree Mackintosh M.D.   On: 12/29/2013 23:37   Dg Lumbar Spine Complete  12/29/2013   CLINICAL DATA:  hx cancer, r/o pathologic fx  EXAM: LUMBAR SPINE - COMPLETE 4+ VIEW  COMPARISON:  None.  FINDINGS: There is no evidence of lumbar spine fracture. Alignment is normal. Intervertebral disc spaces are maintained. There is fusion of L5-S1. No lytic nor blastic lesions are appreciated.  IMPRESSION: No focal acute osseous abnormalities.  Fusion L5-S1.   Electronically Signed   By: Margaree Mackintosh M.D.   On: 12/29/2013 23:23   Dg Hip Complete Right  12/29/2013   CLINICAL DATA:  hx metastatic cancer, r/o pathologic  fx  EXAM: RIGHT HIP - COMPLETE 2+ VIEW  COMPARISON:  None.  FINDINGS: There is no evidence of hip fracture or dislocation. Areas of peripheral hypertrophic spurring involving the acetabulum and lower border of the femoral head. Soft tissues unremarkable.  IMPRESSION: No acute osseous abnormalities.  Mild osteoarthritic changes.   Electronically Signed   By: Margaree Mackintosh M.D.   On: 12/29/2013 23:22     EKG Interpretation None      MDM   Final diagnoses:  Secondary malignant neoplasm of bone and bone marrow  Cancer related pain    BP 133/88  Pulse 99  Temp(Src) 98.2 F (36.8 C) (Oral)  Resp 20  SpO2 98%  I have reviewed nursing notes and vital signs. I personally reviewed the imaging tests through PACS system  I reviewed available ER/hospitalization records thought the EMR     Domenic Moras, PA-C 12/30/13 0104

## 2013-12-29 NOTE — ED Notes (Signed)
Pt states he has metastatic cancer and for the past 3 days he has been having bilateral leg pain

## 2013-12-30 ENCOUNTER — Ambulatory Visit: Admission: RE | Admit: 2013-12-30 | Payer: Medicaid Other | Source: Ambulatory Visit | Admitting: Radiation Oncology

## 2013-12-30 ENCOUNTER — Inpatient Hospital Stay (HOSPITAL_COMMUNITY): Payer: Medicaid Other

## 2013-12-30 ENCOUNTER — Encounter (HOSPITAL_COMMUNITY): Payer: Self-pay | Admitting: Internal Medicine

## 2013-12-30 DIAGNOSIS — R5383 Other fatigue: Secondary | ICD-10-CM

## 2013-12-30 DIAGNOSIS — C7952 Secondary malignant neoplasm of bone marrow: Secondary | ICD-10-CM

## 2013-12-30 DIAGNOSIS — C73 Malignant neoplasm of thyroid gland: Secondary | ICD-10-CM

## 2013-12-30 DIAGNOSIS — C7951 Secondary malignant neoplasm of bone: Secondary | ICD-10-CM

## 2013-12-30 DIAGNOSIS — G893 Neoplasm related pain (acute) (chronic): Principal | ICD-10-CM

## 2013-12-30 DIAGNOSIS — R5381 Other malaise: Secondary | ICD-10-CM

## 2013-12-30 DIAGNOSIS — D649 Anemia, unspecified: Secondary | ICD-10-CM | POA: Diagnosis present

## 2013-12-30 LAB — CBC WITH DIFFERENTIAL/PLATELET
BASOS PCT: 0 % (ref 0–1)
Basophils Absolute: 0 10*3/uL (ref 0.0–0.1)
Basophils Absolute: 0 10*3/uL (ref 0.0–0.1)
Basophils Relative: 0 % (ref 0–1)
EOS PCT: 1 % (ref 0–5)
Eosinophils Absolute: 0 10*3/uL (ref 0.0–0.7)
Eosinophils Absolute: 0 10*3/uL (ref 0.0–0.7)
Eosinophils Relative: 1 % (ref 0–5)
HEMATOCRIT: 31.4 % — AB (ref 39.0–52.0)
HEMATOCRIT: 33.6 % — AB (ref 39.0–52.0)
HEMOGLOBIN: 10 g/dL — AB (ref 13.0–17.0)
Hemoglobin: 10.9 g/dL — ABNORMAL LOW (ref 13.0–17.0)
LYMPHS ABS: 0.7 10*3/uL (ref 0.7–4.0)
LYMPHS PCT: 12 % (ref 12–46)
Lymphocytes Relative: 11 % — ABNORMAL LOW (ref 12–46)
Lymphs Abs: 0.6 10*3/uL — ABNORMAL LOW (ref 0.7–4.0)
MCH: 30.7 pg (ref 26.0–34.0)
MCH: 31 pg (ref 26.0–34.0)
MCHC: 31.8 g/dL (ref 30.0–36.0)
MCHC: 32.4 g/dL (ref 30.0–36.0)
MCV: 95.5 fL (ref 78.0–100.0)
MCV: 96.3 fL (ref 78.0–100.0)
MONO ABS: 0.5 10*3/uL (ref 0.1–1.0)
MONO ABS: 0.7 10*3/uL (ref 0.1–1.0)
MONOS PCT: 10 % (ref 3–12)
MONOS PCT: 11 % (ref 3–12)
NEUTROS ABS: 4.9 10*3/uL (ref 1.7–7.7)
Neutro Abs: 3.7 10*3/uL (ref 1.7–7.7)
Neutrophils Relative %: 77 % (ref 43–77)
Neutrophils Relative %: 77 % (ref 43–77)
Platelets: 238 10*3/uL (ref 150–400)
Platelets: 277 10*3/uL (ref 150–400)
RBC: 3.26 MIL/uL — ABNORMAL LOW (ref 4.22–5.81)
RBC: 3.52 MIL/uL — ABNORMAL LOW (ref 4.22–5.81)
RDW: 15.6 % — AB (ref 11.5–15.5)
RDW: 15.6 % — ABNORMAL HIGH (ref 11.5–15.5)
WBC: 4.8 10*3/uL (ref 4.0–10.5)
WBC: 6.3 10*3/uL (ref 4.0–10.5)

## 2013-12-30 LAB — COMPREHENSIVE METABOLIC PANEL
ALBUMIN: 3 g/dL — AB (ref 3.5–5.2)
ALT: 17 U/L (ref 0–53)
AST: 13 U/L (ref 0–37)
Alkaline Phosphatase: 69 U/L (ref 39–117)
BUN: 8 mg/dL (ref 6–23)
CO2: 23 mEq/L (ref 19–32)
Calcium: 8.6 mg/dL (ref 8.4–10.5)
Chloride: 106 mEq/L (ref 96–112)
Creatinine, Ser: 1.04 mg/dL (ref 0.50–1.35)
GFR calc Af Amer: >90 mL/min (ref >90)
GFR calc non Af Amer: 85 mL/min — ABNORMAL LOW (ref >90)
Glucose, Bld: 99 mg/dL (ref 70–99)
Potassium: 4.1 mEq/L (ref 3.7–5.3)
SODIUM: 141 meq/L (ref 137–147)
TOTAL PROTEIN: 5.6 g/dL — AB (ref 6.0–8.3)
Total Bilirubin: <0.2 mg/dL — ABNORMAL LOW (ref 0.3–1.2)

## 2013-12-30 LAB — I-STAT CHEM 8, ED
BUN: 6 mg/dL (ref 6–23)
CALCIUM ION: 1.19 mmol/L (ref 1.12–1.23)
CHLORIDE: 104 meq/L (ref 96–112)
CREATININE: 1.2 mg/dL (ref 0.50–1.35)
GLUCOSE: 91 mg/dL (ref 70–99)
HCT: 34 % — ABNORMAL LOW (ref 39.0–52.0)
Hemoglobin: 11.6 g/dL — ABNORMAL LOW (ref 13.0–17.0)
Potassium: 3.7 mEq/L (ref 3.7–5.3)
Sodium: 140 mEq/L (ref 137–147)
TCO2: 25 mmol/L (ref 0–100)

## 2013-12-30 MED ORDER — ACETAMINOPHEN 325 MG PO TABS: 650.0000 mg | ORAL_TABLET | Freq: Four times a day (QID) | ORAL | Status: DC | PRN

## 2013-12-30 MED ORDER — ONDANSETRON HCL 4 MG PO TABS: 4.0000 mg | ORAL_TABLET | Freq: Four times a day (QID) | ORAL | Status: DC | PRN

## 2013-12-30 MED ORDER — HYDROMORPHONE HCL PF 1 MG/ML IJ SOLN: 1.0000 mg | INTRAMUSCULAR | Status: DC | PRN

## 2013-12-30 MED ORDER — ACETAMINOPHEN 650 MG RE SUPP: 650.0000 mg | Freq: Four times a day (QID) | RECTAL | Status: DC | PRN

## 2013-12-30 MED ORDER — ONDANSETRON HCL 4 MG/2ML IJ SOLN: 4.0000 mg | Freq: Three times a day (TID) | INTRAMUSCULAR | Status: DC | PRN

## 2013-12-30 MED ORDER — ONDANSETRON HCL 4 MG/2ML IJ SOLN: 4.0000 mg | Freq: Four times a day (QID) | INTRAMUSCULAR | Status: DC | PRN

## 2013-12-30 MED ORDER — GADOBENATE DIMEGLUMINE 529 MG/ML IV SOLN
16.0000 mL | Freq: Once | INTRAVENOUS | Status: AC | PRN
  Administered 2013-12-30: 16 mL via INTRAVENOUS

## 2013-12-30 MED ORDER — HYDROMORPHONE HCL PF 1 MG/ML IJ SOLN
1.0000 mg | INTRAMUSCULAR | Status: DC | PRN
  Administered 2013-12-30 – 2014-01-01 (×16): 1 mg via INTRAVENOUS
  Filled 2013-12-30 (×17): qty 1

## 2013-12-30 MED ORDER — LEVOTHYROXINE SODIUM 100 MCG PO TABS
100.0000 ug | ORAL_TABLET | Freq: Every day | ORAL | Status: DC
  Administered 2013-12-30 – 2014-01-01 (×3): 100 ug via ORAL
  Filled 2013-12-30 (×4): qty 1

## 2013-12-30 MED ORDER — DEXAMETHASONE SODIUM PHOSPHATE 10 MG/ML IJ SOLN
6.0000 mg | Freq: Four times a day (QID) | INTRAMUSCULAR | Status: DC
  Administered 2013-12-30 – 2014-01-01 (×9): 6 mg via INTRAVENOUS
  Filled 2013-12-30 (×13): qty 0.6

## 2013-12-30 MED ORDER — ADULT MULTIVITAMIN W/MINERALS CH
1.0000 | ORAL_TABLET | Freq: Every day | ORAL | Status: DC
  Administered 2013-12-30 – 2014-01-01 (×3): 1 via ORAL
  Filled 2013-12-30 (×3): qty 1

## 2013-12-30 MED ORDER — HYDROMORPHONE HCL PF 1 MG/ML IJ SOLN
1.0000 mg | Freq: Once | INTRAMUSCULAR | Status: AC
Start: 2013-12-30 — End: 2013-12-30
  Administered 2013-12-30: 1 mg via INTRAVENOUS
  Filled 2013-12-30: qty 1

## 2013-12-30 MED ORDER — PANTOPRAZOLE SODIUM 40 MG PO TBEC
40.0000 mg | DELAYED_RELEASE_TABLET | Freq: Every day | ORAL | Status: DC
  Administered 2013-12-30 – 2014-01-01 (×3): 40 mg via ORAL
  Filled 2013-12-30 (×3): qty 1

## 2013-12-30 MED ORDER — DOCUSATE SODIUM 100 MG PO CAPS
100.0000 mg | ORAL_CAPSULE | Freq: Two times a day (BID) | ORAL | Status: DC
  Administered 2013-12-30 – 2014-01-01 (×5): 100 mg via ORAL
  Filled 2013-12-30 (×6): qty 1

## 2013-12-30 MED ORDER — SODIUM CHLORIDE 0.9 % IV SOLN
INTRAVENOUS | Status: AC
  Administered 2013-12-30: 1000 mL via INTRAVENOUS
  Administered 2013-12-30: 03:00:00 via INTRAVENOUS

## 2013-12-30 MED ORDER — SODIUM CHLORIDE 0.9 % IV BOLUS (SEPSIS)
1000.0000 mL | Freq: Once | INTRAVENOUS | Status: AC
  Administered 2013-12-30: 1000 mL via INTRAVENOUS

## 2013-12-30 NOTE — Care Management Note (Signed)
CARE MANAGEMENT NOTE 12/30/2013  Patient:  Frank Ward, Frank Ward   Account Number:  [id number]  Date Initiated:  12/30/2013  Documentation initiated by:  Samul Mcinroy  Subjective/Objective Assessment:   43 yo male admitted with Uncontrolled cancer related pain secondary to osseous metastasesPCP: [name], MD     Action/Plan:   Discharged when stable   Anticipated DC Date:     Anticipated DC Plan:    In-house referral  Hospice / [place]  CM consult      Choice offered to / List presented to:  NA   DME arranged  NA      DME agency  NA     [place] arranged  NA      [place] agency  NA   Status of service:  In process, will continue to follow Medicare Important Message given?   (If response is "NO", the following Medicare IM given date fields will be blank) Date Medicare IM given:   Date Additional Medicare IM given:    Discharge Disposition:    Per UR Regulation:  Reviewed for med. necessity/level of care/duration of stay  If discussed at [place] of Stay Meetings, dates discussed:    Comments:  12/30/13 [place] [phone] Chart reviewed for utilization of services. Palliative care to consult concerning [place]. Will continue to follow for possible dc needs.

## 2013-12-30 NOTE — Progress Notes (Signed)
I tried to visit the patient late this afternoon but he was away. I wanted to discuss with him the recommendation from brain and spine conference this morning. His recent MRI scan of the brain and C-spine were reviewed. Given his rapid progression in an area which recently received radiation treatment to the upper cervical spine, and given the extent of disease locally, further radiation treatment was not recommended. In conference, neurosurgery also did not believe that aggressive treatment from there perspective was warranted. Neurosurgery did recommend a C. collar to help with stabilization.  Palliative medicine was also present in conference and a referral has been made for their input which I believe would be very helpful for this unfortunate gentleman.

## 2013-12-30 NOTE — Progress Notes (Addendum)
Pt states his Right side of face,neck and shoulder feels tingling, states he has experienced this since the end of January. Pt states acouple of weeks ago he was seen in [facility] ED and was diagnosed with pneumonia can't remember if he was given an antibiotic, states he was treated for a UTI. [facility] wanted him to come to [facility], pt stated he had something going on so he didn't. Pt turned off his IV pump because it was alarming. Instructed not touch the IV pump and I locked it. Pt stated OK. Instructed to use his call light for help.

## 2013-12-30 NOTE — Consult Note (Signed)
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  Progressive disease with limited treatment options.  Complicated by multiple psycho-social issues.   Consult is for introduction to concept of Palliative Medicine, clarification of goals of care, disposition and options, and symptom recommendations.  This NP Wadie Lessen reviewed medical records, received report from team, assessed the patient and then meet at the patient's bedside  to discuss diagnosis, prognosis, GOC, disposition and options.  A  discussion was had today regarding advanced directives.  Concepts specific to code status was had.  The difference between a aggressive medical intervention path  and a palliative comfort care path for this patient at this time was had.  Values and goals of care important to patient and family were attempted to be elicited.  Concept of  Palliative Care was discussed   PMT will continue to support holistically.   Goals of Care: 1.  Code Status:  Full Code   2. Scope of Treatment:  Patient verbalizes desire for treatment that "will give me a chance".  He tells me he is open to all available and offered medical interventions that will prolong life.  He openly speaks to his issues of non-compliance as it relates to his personnel situation and "how ludicrous" his experience has bee. ie "discharging a man from the hospital into the snow who has lung tumors and pneumonia" and then "being arrested and manhandled by hospital police for not wanting to leave into a bad situation"    Long discussion with patient trying to help him understand the logistic of hospitalization as it is  affected by outside influences, and the patient's responsibility in his own healthcare and treatment plan.   3. Disposition:  Dependant on outcomes.  Patient tells me he is presently staying "with a relative".  He was unwilling to share names or numbers with me.  He says he is originally  from [place]. He easily agitates with any discussion regarding discharge plan.   4. Symptom Management:   1. Pain: Dilaudid as previously written 2. Weakness:  PT evaluation and treatment as indicated    5. Psychosocial:  Emotional support offered to patient.  He expresses great frustration with his situation specifically as it relates to his overall medical experience over the past many months since diagnosis.  He is open to this NP following up for regular visits to help him process his situation    Brief HPI:  43 y.o. male with history of metastatic thyroid cancer admitted through the  ER because of pain. Patient has been having increasing pain around his scalp and neck.  H/o metastatic thyroid cancer to brain and bone.  Patient has been non-compliant with his radiation therapy and overall treatment plan.  Patient speaks to this "non compliance" in relations to  Issues of finances and personal situation.  He also has legal issues from behavior demonstrates in the ER on multiple occasions   Patient has had MRI of the brain and C-spine recently which showed - Progressive collapse of pathologic fractures at C3 and to a lesser extent C4. Progressive right foraminal and central canal stenosis  at C2-3. Narrowing of the central canal to 9 mm at the level of C3 with mass effect on the spinal cord and mild cord edema. Progressive tumor encroachment into the spinal canal at the C3 level.   Patient was originally scheduled to follow-up with Dr. [name]  today but since his pain worsened he presented to the ER, he is now hospitalized.   Patient is now faced with advanced directive decisions and anticipatory care  needs.   ROS:  Right scalp and shoulder numbness, weakness   PMH:  Past Medical History  Diagnosis Date  . Cancer     Thyroid  . Thyroid disease   . Hypertension   . Pneumonia      PSH: Past Surgical History  Procedure Laterality Date  . Tendon repair    . Thyroidectomy     I have reviewed the [place] and SH and  If appropriate update it with new information. No Known Allergies Scheduled Meds: . dexamethasone  6 mg Intravenous 4 times per day  . docusate sodium  100 mg Oral BID  . levothyroxine  100 mcg Oral QAC breakfast  . multivitamin with minerals  1 tablet Oral Daily  . pantoprazole  40 mg Oral Daily   Continuous Infusions: . sodium chloride 75 mL/hr at 12/30/13 0309   PRN Meds:.acetaminophen, acetaminophen, HYDROmorphone (DILAUDID) injection, ondansetron (ZOFRAN) IV, ondansetron    BP 133/88  Pulse 87  Temp(Src) 97.5 F (36.4 C) (Oral)  Resp 16  Ht 6\' 4"  (1.93 m)  Wt 84.188 kg (185 lb 9.6 oz)  BMI 22.60 kg/m2  SpO2 98%   PPS:50 % at best   Intake/Output Summary (Last 24 hours) at 12/30/13 1051 Last data filed at 12/30/13 0800  Gross per 24 hour  Intake 1083.75 ml  Output    250 ml  Net 833.75 ml     Physical Exam:  General: alert and oriented, NAD HEENT:  Moist membranes, no exudate Chest:   CTA CVS: RRR Abdomen: soft NT +BS Ext: without edema Skin: warm and dry  Labs: CBC    Component Value Date/Time   WBC 4.8 12/30/2013 0355   RBC 3.26* 12/30/2013 0355   HGB 10.0* 12/30/2013 0355   HCT 31.4* 12/30/2013 0355   PLT 238 12/30/2013 0355   MCV 96.3 12/30/2013 0355   MCH 30.7 12/30/2013 0355   MCHC 31.8 12/30/2013 0355   RDW 15.6* 12/30/2013 0355   LYMPHSABS 0.6* 12/30/2013 0355   MONOABS 0.5 12/30/2013 0355   EOSABS 0.0 12/30/2013 0355   BASOSABS 0.0 12/30/2013 0355    BMET    Component Value Date/Time   NA 141 12/30/2013 0355   K 4.1 12/30/2013 0355   CL 106 12/30/2013 0355   CO2 23 12/30/2013 0355   GLUCOSE 99 12/30/2013 0355   BUN 8  12/30/2013 0355   CREATININE 1.04 12/30/2013 0355   CALCIUM 8.6 12/30/2013 0355   GFRNONAA 85* 12/30/2013 0355   GFRAA >90 12/30/2013 0355    CMP     Component Value Date/Time   NA 141 12/30/2013 0355   K 4.1 12/30/2013 0355   CL 106 12/30/2013 0355   CO2 23 12/30/2013 0355   GLUCOSE 99 12/30/2013 0355   BUN 8 12/30/2013 0355   CREATININE 1.04 12/30/2013 0355   CALCIUM 8.6 12/30/2013 0355   PROT 5.6* 12/30/2013 0355   ALBUMIN 3.0* 12/30/2013 0355   AST 13 12/30/2013 0355   ALT 17 12/30/2013 0355   ALKPHOS 69 12/30/2013 0355  BILITOT <0.2* 12/30/2013 0355   GFRNONAA 85* 12/30/2013 0355   GFRAA >90 12/30/2013 0355    Time In Time Out Total Time Spent with Patient Total Overall Time  1000 1115 70 min 75 min    Greater than 50%  of this time was spent counseling and coordinating care related to the above assessment and plan.   Wadie Lessen NP  Palliative Medicine Team Team Phone # [phone] Pager [phone]  Discussed with Dr Charlies Silvers

## 2013-12-30 NOTE — Progress Notes (Signed)
[facility] PROGRESS NOTE  Frank Ward MEQ:[id number] DOB: 1971-02-28 DOA: 12/29/2013 PCP: [name], MD  Brief narrative: 43 y.o. 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 He presented to [facility] ED 12/29/2013 with complaints of intractable neck pain, lower extremity weakness and cramps and reports of falls at home. He recently had MRI brain and cervical spine (in 10/2013) with findings of progressive collapse of pathologic fractures at C3 and to a lesser extent C4; progressive right foraminal and central canal stenosis at C2-3 with mass effect on the spinal cord and mild cord edema. In ED, evaluation included right hip and lumbar spine which did not show acute osseous abnormalities. His Cervical spine x ray showed chronic compression deformities involving C4 and C5 but no acute osseous abnormalities.  Assessment/Plan:  Principal Problem:   Uncontrolled cancer related pain secondary to osseous metastases - secondary to progressive metastases to the spine - apparently per radiation oncology recommendation is to have palliative care team discuss goals of care with the pt - continue decadron 6 mg IV Q 6 hours  - will ask neurosurgery for input on management as well - pain regimen: dilaudid 1 mg every 2 hours IV PRN Active Problems:   Hypothyroidism - continue synthroid 100 mcg daily     Code Status: full code Family Communication: no family at the bedside  Disposition Plan: home when stable   Robbie Lis, MD  [facility] Pager [phone]  If 7PM-7AM, please contact night-coverage [email or web address] Password [email or web address] 12/30/2013, 11:55  AM   LOS: 1 day   Consultants:  Radiation oncology  Neurosurgery  Palliative care team  Procedures:  None   Antibiotics:  None   HPI/Subjective: Complaints of pain.   Objective: Filed Vitals:   12/29/13 2128 12/29/13 2326 12/30/13 0138 12/30/13 0202  BP: 136/69 133/88 117/79 133/88  Pulse: 114 99 95 87  Temp: 98.2 F (36.8 C)  98.7 F (37.1 C) 97.5 F (36.4 C)  TempSrc: Oral  Oral Oral  Resp: 20 20 20 16   Height:    6\' 4"  (1.93 m)  Weight:    84.188 kg (185 lb 9.6 oz)  SpO2: 96% 98% 100% 98%    Intake/Output Summary (Last 24 hours) at 12/30/13 1155 Last data filed at 12/30/13 1103  Gross per 24 hour  Intake 1083.75 ml  Output    650 ml  Net 433.75 ml    Exam:   General:  Pt is alert, follows commands appropriately, not in acute distress  Cardiovascular: Regular rate and rhythm, [name], no murmurs, no rubs, no gallops  Respiratory: Clear to auscultation bilaterally, no wheezing, no crackles, no rhonchi  Abdomen: Soft, non tender, non distended, bowel sounds present, no guarding  Extremities: No edema, pulses DP and PT palpable bilaterally  Neuro: Grossly nonfocal  Data Reviewed: Basic Metabolic Panel:  Recent Labs Lab 12/30/13 0031 12/30/13 0355  NA 140 141  K 3.7 4.1  CL 104 106  CO2  --  23  GLUCOSE 91 99  BUN 6 8  CREATININE 1.20 1.04  CALCIUM  --  8.6   Liver Function Tests:  Recent Labs Lab 12/30/13 0355  AST 13  ALT 17  ALKPHOS 69  BILITOT <0.2*  PROT 5.6*  ALBUMIN 3.0*   No results found for this basename: LIPASE, AMYLASE,  in the last 168 hours No results found for this basename: AMMONIA,  in the last 168 hours CBC:  Recent Labs Lab 12/30/13 0025 12/30/13 0031 12/30/13 0355  WBC 6.3  --  4.8  NEUTROABS 4.9  --  3.7  HGB 10.9* 11.6* 10.0*  HCT 33.6* 34.0* 31.4*  MCV 95.5  --  96.3  PLT 277  --  238   Cardiac Enzymes: No results found for this basename: CKTOTAL, CKMB, CKMBINDEX, TROPONINI,  in the last 168  hours BNP: No components found with this basename: POCBNP,  CBG: No results found for this basename: GLUCAP,  in the last 168 hours  No results found for this or any previous visit (from the past 240 hour(s)).   Studies: Dg Cervical Spine Complete 12/29/2013  IMPRESSION: Chronic compression deformities involving C4 and C5. No acute osseous abnormalities.     Dg Lumbar Spine Complete 12/29/2013    IMPRESSION: No focal acute osseous abnormalities.  Fusion L5-S1.     Dg Hip Complete Right 12/29/2013    IMPRESSION: No acute osseous abnormalities.  Mild osteoarthritic changes.       Scheduled Meds: . dexamethasone  6 mg Intravenous 4 times per day  . docusate sodium  100 mg Oral BID  . levothyroxine  100 mcg Oral QAC breakfast  . multivitamin   1 tablet Oral Daily  . pantoprazole  40 mg Oral Daily   Continuous Infusions: . sodium chloride 75 mL/hr at 12/30/13 0309

## 2013-12-30 NOTE — H&P (Signed)
[facility] History and Physical  Frank Ward YWV:[id number] DOB: [birth year] DOA: 12/29/2013  Referring physician: ER physician. PCP: [name], MD   Chief Complaint: Pain.  HPI: Frank Ward is a 43 y.o. male with history of metastatic thyroid cancer presents to the ER because of pain. Patient has been having increasing pain around her scalp and neck and mid back and legs. Patient has not been compliant with his radiation therapy. Patient has had MRI of the brain and C-spine recently which showed - Progressive collapse of pathologic fractures at C3 and to a lesser extent C4. Progressive right foraminal and central canal stenosis at C2-3. Narrowing of the central canal to 9 mm at the level of C3 with mass effect on the spinal cord and mild cord edema. Progressive tumor encroachment into the spinal canal at the C3 level. These results were notified to patient's radiation oncologist Dr. [name]. Patient is originally scheduled to follow Dr. [name] later today but since his pain worsened he presented to the ER. In the ER the ED physician had done x-rays of C-spine L-spine and right hip which did not show anything acute and has been admitted for further pain management. On my exam patient states that he has radiating pain to both his lower extremities and mid back. He does not have any incontinence of urine or bowel. His legs give away sometimes. Denies any chest pain or shortness of breath or loss of consciousness.   Review of Systems: As presented in the history of presenting illness, rest negative.  Past Medical History  Diagnosis Date  . Cancer     Thyroid  . Thyroid disease   . Hypertension   . Pneumonia    Past Surgical History  Procedure Laterality Date  . Tendon repair    . Thyroidectomy     Social History:  reports that he quit smoking about 2 years ago. His smoking use included Cigarettes. He smoked 0.00 packs per day for 10 years. He has never used smokeless tobacco. He  reports that he drinks about 4.8 ounces of alcohol per week. He reports that he uses illicit drugs (Marijuana). Where does patient live home. Can patient participate in ADLs? Yes.  No Known Allergies  Family History:  Family History  Problem Relation Age of Onset  . Diabetes Mother   . Diabetes Brother       Prior to Admission medications   Medication Sig Start Date End Date Taking? Authorizing Provider  dexamethasone (DECADRON) 4 MG tablet Take 4 mg by mouth 3 (three) times daily.  09/17/13  Yes Historical Provider, MD  ibuprofen (ADVIL,MOTRIN) 200 MG tablet Take 200-400 mg by mouth every 6 (six) hours as needed for mild pain.   Yes Historical Provider, MD  levothyroxine (SYNTHROID, LEVOTHROID) 100 MCG tablet Take 100 mcg by mouth daily before breakfast.    Yes Historical Provider, MD  Multiple Vitamin (MULTIVITAMIN WITH MINERALS) TABS tablet Take 1 tablet by mouth daily.   Yes Historical Provider, MD  traMADol (ULTRAM) 50 MG tablet Take 50 mg by mouth at bedtime as needed for moderate pain.    Yes Historical Provider, MD    Physical Exam: Filed Vitals:   12/29/13 2128 12/29/13 2326 12/30/13 0138 12/30/13 0202  BP: 136/69 133/88 117/79 133/88  Pulse: 114 99 95 87  Temp: 98.2 F (36.8 C)  98.7 F (37.1 C) 97.5 F (36.4 C)  TempSrc: Oral  Oral Oral  Resp: 20 20 20 16   Height:    6'  4" (1.93 m)  Weight:    84.188 kg (185 lb 9.6 oz)  SpO2: 96% 98% 100% 98%     General:  Well-developed and nourished.  Eyes: Anicteric no pallor.  ENT: No discharge from the ears eyes nose mouth.  Neck: No mass felt.  Cardiovascular: S1-S2 heard.  Respiratory: No rhonchi or crepitations.  Abdomen: Soft nontender bowel sounds present. No guarding or rigidity.  Skin: No rash.  Musculoskeletal: No edema.  Psychiatric: Appears normal.  Neurologic: Alert awake oriented to time place and person. Moves all extremities 5 x 5. Deep tendon reflexes of the lower extremities are brisk.  Labs  on Admission:  Basic Metabolic Panel:  Recent Labs Lab 12/30/13 0031  NA 140  K 3.7  CL 104  GLUCOSE 91  BUN 6  CREATININE 1.20   Liver Function Tests: No results found for this basename: AST, ALT, ALKPHOS, BILITOT, PROT, ALBUMIN,  in the last 168 hours No results found for this basename: LIPASE, AMYLASE,  in the last 168 hours No results found for this basename: AMMONIA,  in the last 168 hours CBC:  Recent Labs Lab 12/30/13 0025 12/30/13 0031  WBC 6.3  --   NEUTROABS 4.9  --   HGB 10.9* 11.6*  HCT 33.6* 34.0*  MCV 95.5  --   PLT 277  --    Cardiac Enzymes: No results found for this basename: CKTOTAL, CKMB, CKMBINDEX, TROPONINI,  in the last 168 hours  BNP (last 3 results) No results found for this basename: PROBNP,  in the last 8760 hours CBG: No results found for this basename: GLUCAP,  in the last 168 hours  Radiological Exams on Admission: Dg Cervical Spine Complete  12/29/2013   CLINICAL DATA:  hx cancer, r/o pathologic fx  EXAM: CERVICAL SPINE  4+ VIEWS  COMPARISON:  MR HEAD WO/W CM dated 12/26/2013; DG CHEST 2 VIEW dated 12/09/2013; DG CERVICAL SPINE COMPLETE dated 10/16/2013  FINDINGS: Stable chronic compression fractures involving C4 and C5. Degenerative disc disease changes within the lower cervical spine. No acute osseous abnormalities are appreciated. Stable pathologic fractures involving C4 and C5. No acute osseous abnormalities. There are areas of degenerative disc disease change within the mid and lower cervical spine.  IMPRESSION: Chronic compression deformities involving C4 and C5. No acute osseous abnormalities.   Electronically Signed   By: Margaree Mackintosh M.D.   On: 12/29/2013 23:37   Dg Lumbar Spine Complete  12/29/2013   CLINICAL DATA:  hx cancer, r/o pathologic fx  EXAM: LUMBAR SPINE - COMPLETE 4+ VIEW  COMPARISON:  None.  FINDINGS: There is no evidence of lumbar spine fracture. Alignment is normal. Intervertebral disc spaces are maintained. There is  fusion of L5-S1. No lytic nor blastic lesions are appreciated.  IMPRESSION: No focal acute osseous abnormalities.  Fusion L5-S1.   Electronically Signed   By: Margaree Mackintosh M.D.   On: 12/29/2013 23:23   Dg Hip Complete Right  12/29/2013   CLINICAL DATA:  hx metastatic cancer, r/o pathologic fx  EXAM: RIGHT HIP - COMPLETE 2+ VIEW  COMPARISON:  None.  FINDINGS: There is no evidence of hip fracture or dislocation. Areas of peripheral hypertrophic spurring involving the acetabulum and lower border of the femoral head. Soft tissues unremarkable.  IMPRESSION: No acute osseous abnormalities.  Mild osteoarthritic changes.   Electronically Signed   By: Margaree Mackintosh M.D.   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 Pain with history of metastatic cancer - since patient is complaining of mid back pain and radiation to the lower extremities I have ordered MRI of the T-spine and L-spine with and without contrast. For now I have placed patient on Decadron IV. Consultations radiation oncologist Dr. [name] in a.m. Continue with pain relief medications.  2. Metastatic thyroid cancer - see #1. 3. Anemia - follow CBC.    Code Status: Full code.  Family Communication: None.  Disposition Plan: Admit to inpatient.    [facility] Pager [phone].  If 7PM-7AM, please contact night-coverage [email or web address] Password [facility] 12/30/2013, 2:49 AM

## 2013-12-30 NOTE — Progress Notes (Signed)
Pt was lying in bed and awake when I arrived. Upon my greeting pt began expressing his feelings about his hospital care and stay, stating they are already telling me what I don't want to hear. When asked what he means, he said they are saying they don't know what to do. He said they are telling me they are trying to find me housing and said he does not need housing. Pt said he just needs "them" (speaking of drs) to give him radiation, "shrink the tumors so that I can get rid of the pain and go home". Pt admitted to missing radiation and then said, "I'm here now. Why won't they just help me."  Several times pt made statements like,  "they think I'm stupid," "I'm not retarded," and "they keep playing these games with me." Pt also said "I know I'm paranoid" and "I know I'm suspicious."  Pt said he got pneumonia b/c he was put out on the street before (during a previous hospital stay) while it was cold. When asked what I could do for him, pt said he just needs someone to help him. He said he knows something is not right. When asked how I could help and what kind of help he needs, pt said he just needs a good diet, saying he could not get the right foods b/c he lost his food stamps. Pt said he needs radiation and a good oncologist. I tried to assure pt we are here, as a team, to take good care of him. I tried to assure him that I'm sure the doctors are working together to decide the best treatment for him. Pt expressed frustration about being asked a lot of questions, "like I'm stupid or they are going to put me in some crazy house."  I also tried to assure pt we are asking a lot of questions in order to know how to best take care of him and letting him know since he arrived last night we just need know more information by asking him questions. Pt said ok. In closing the conversation, I told pt we are here to help and to let us know what he needs. Pt thank me for visit and reached out for handshake as we concluded our  visit. Ernest Haber Chaplain  12/30/13 1100  Clinical Encounter Type  Visited With Patient

## 2013-12-31 DIAGNOSIS — Z515 Encounter for palliative care: Secondary | ICD-10-CM

## 2013-12-31 DIAGNOSIS — R531 Weakness: Secondary | ICD-10-CM

## 2013-12-31 DIAGNOSIS — C7931 Secondary malignant neoplasm of brain: Secondary | ICD-10-CM

## 2013-12-31 DIAGNOSIS — C7949 Secondary malignant neoplasm of other parts of nervous system: Secondary | ICD-10-CM

## 2013-12-31 DIAGNOSIS — G893 Neoplasm related pain (acute) (chronic): Secondary | ICD-10-CM

## 2013-12-31 NOTE — Evaluation (Addendum)
Physical Therapy Evaluation Patient Details Name: Frank Ward MRN: [id number] DOB: [birth year] Today's Date: 12/31/2013   History of Present Illness  43 y.o. 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 He presented to [facility] ED 12/29/2013 with complaints of intractable neck pain, lower extremity weakness and cramps and reports of falls at home. Imaging showed multiple bony metastases including T9 pathologic compression fx and S1 nerve impingement, as well as mets to lung.   Clinical Impression  *Pt admitted with *intractable neck, back, BLE pain, fall*. Pt currently with functional limitations due to the deficits listed below (see PT Problem List).  Pt will benefit from skilled PT to increase their independence and safety with mobility to allow discharge to the venue listed below.    Pt expressed frustration over many issues. He is on waiting list for public housing and did not want to discuss his DC plan. 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  **    Follow Up Recommendations Home health PT    Equipment Recommendations  Rolling walker with 5" wheels    Recommendations for Other Services       Precautions / Restrictions Precautions Precautions: Fall Precaution Comments: pt fell 4-5 days ago, per his report Required Braces or Orthoses: Cervical Brace Cervical Brace: Hard collar Restrictions Weight Bearing Restrictions: No      Mobility  Bed Mobility Overal bed mobility: Independent                Transfers Overall transfer level: Independent                  Ambulation/Gait Ambulation/Gait assistance: Supervision Ambulation Distance (Feet): 300  Feet Assistive device: Rolling walker (2 wheeled) Gait Pattern/deviations: WFL(Within Functional Limits)   Gait velocity interpretation: at or above normal speed for age/gender General Gait Details: close supervision for safety 2* recent fall, pt steady with RW, no LOB; pt stated he doesn't need a RW upon acute DC, he wants to use his cane despite recent fall, he feels if he gets radiation he'll be fine  Stairs            Wheelchair Mobility    Modified Rankin (Stroke Patients Only)       Balance Overall balance assessment: History of Falls                                           Pertinent Vitals/Pain **7.5/10 back and BLEs at rest and with walking Pain meds requested*    Home Living Family/patient expects to be discharged to:: Unsure                 Additional Comments: Pt did not want to discuss his living situation. He stated he's on waiting list for public housing, that he'd been staying with relatives PTA. He requested that I not ask him about his housing.     Prior Function Level of Independence: Independent with assistive device(s)         Comments: used cane     Hand Dominance  Extremity/Trunk Assessment   Upper Extremity Assessment: Overall WFL for tasks assessed           Lower Extremity Assessment: Overall WFL for tasks assessed;RLE deficits/detail      Cervical / Trunk Assessment: Normal  Communication   Communication: No difficulties  Cognition Arousal/Alertness: Awake/alert Behavior During Therapy: Agitated (frustrated about pain, housing situation, feels he isn't getting treatment he should, wants to be able to work) Overall Cognitive Status: Within Functional Limits for tasks assessed                      General Comments      Exercises        Assessment/Plan    PT Assessment Patient needs continued PT services  PT Diagnosis Acute pain   PT Problem List Decreased balance;Impaired  sensation;Decreased safety awareness;Decreased mobility  PT Treatment Interventions Gait training;DME instruction;Stair training;Therapeutic exercise   PT Goals (Current goals can be found in the Care Plan section) Acute Rehab PT Goals Patient Stated Goal: 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 PT Goal Formulation: With patient Time For Goal Achievement: 01/14/14 Potential to Achieve Goals: Fair    Frequency Min 3X/week   Barriers to discharge   pt unwilling to discuss his destination upon DC, unclear what level of support is available    Co-evaluation               End of Session Equipment Utilized During Treatment: Gait belt;Cervical collar Activity Tolerance: Patient tolerated treatment well Patient left: in bed;with call bell/phone within reach Nurse Communication: Mobility status         Time: 1430-1503 PT Time Calculation (min): 33 min   Charges:   PT Evaluation $Initial PT Evaluation Tier I: 1 Procedure PT Treatments $Gait Training: 8-22 mins   PT G CodesLucile Crater 12/31/2013, 3:24 PM [phone]
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
## 2013-12-31 NOTE — Progress Notes (Signed)
I had a brief visit w/pt today, who was in bed when I arrived. He said he felt btr today and slept well last night. He still says he hopes they can start radiation. He said he is "just waiting on them to them me something."  Pt said he needs to get rid of this pain so he can go to work. Pt's attitude seemed positive today and he even said "I feel like today is going to be a good day."  Please call if additional spt is needed. [place]  12/31/13 1000  Clinical Encounter Type  Visited With Patient
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
## 2013-12-31 NOTE — Progress Notes (Signed)
OT Cancellation Note  Patient Details Name: Frank Ward MRN: [id number] DOB: 25-Mar-1971   Cancelled Treatment:    Reason Eval/Treat Not Completed: Other (comment).  Awaiting c-collar.  Palliative consult is also pending. Will check back.    [name] 12/31/2013, 7:56 AM [name], OTR/L [phone] 12/31/2013

## 2013-12-31 NOTE — Progress Notes (Addendum)
[facility] PROGRESS NOTE  Frank Ward XHB:[id number] DOB: 29-Jun-1971 DOA: 12/29/2013 PCP: [name], MD  Brief narrative: 43 y.o. 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 He presented to [facility] ED 12/29/2013 with complaints of intractable neck pain, lower extremity weakness and cramps and reports of falls at home. He recently had MRI brain and cervical spine (in 10/2013) with findings of progressive collapse of pathologic fractures at C3 and to a lesser extent C4; progressive right foraminal and central canal stenosis at C2-3 with mass effect on the spinal cord and mild cord edema. In ED, evaluation included right hip and lumbar spine which did not show acute osseous abnormalities. His cervical spine x ray showed chronic compression deformities involving C4 and C5 but no acute osseous abnormalities. Other studies on this admission included MRI lumbar and thoracic spine which were significant for T1 and T9 metastases, the latter with a pathologic compression fracture affecting the T9 neural foramina, but no spinal cord compression. Also see was early osseous tumor in the T3-T6 and T11 vertebrae, bulky thoracolumbar junction osseous tumor with epidural and extraosseous extension. No malignant lumbar spinal stenosis but epidural tumor involves the bilateral T12 and right L1 neural foramina; bulky sacral tumor with extraosseous extension, including impingement of the exiting left S1 nerve. Per radiation oncology, further radiation may not be beneficial due to the expense of metastatic disease. Surgical intervention not recommended per  neurosurgery.  Assessment/Plan:   Principal Problem:  Uncontrolled cancer related pain secondary to osseous metastases   secondary to progressive metastases to the spine as indicated above  Palliative care team is following, addressing goals of care   Current pain regimen is with Dilaudid 1 mg every 2 hours IV as needed  Pt encourage to participate with PT  Active Problems:  Hypothyroidism   Continue synthroid 100 mcg daily   Code Status: full code  Family Communication: no family at the bedside  Disposition Plan: home when stable   Consultants:  Radiation oncology  Neurosurgery  Palliative care team Procedures:  None  Antibiotics:  None    Robbie Lis, MD  [facility] Pager [phone]  If 7PM-7AM, please contact night-coverage [email or web address] Password [email or web address] 12/31/2013, 12:09 PM   LOS: 2 days    HPI/Subjective: Complains of pain in legs, 5/10 in intensity.   Objective: Filed Vitals:   12/30/13 0202 12/30/13 1339 12/30/13 2132 12/31/13 0532  BP: 133/88 147/92 141/90 132/88  Pulse: 87 88 95 79  Temp: 97.5 F (36.4 C) 98.3 F (36.8 C) 97.8 F (36.6 C) 98.2 F (36.8 C)  TempSrc: Oral Oral Oral Oral  Resp: 16 17 20 20   Height: 6\' 4"  (1.93 m)     Weight: 84.188 kg (185 lb 9.6 oz)     SpO2: 98% 98% 98% 96%    Intake/Output Summary (Last 24 hours) at 12/31/13 1209 Last data filed at 12/31/13 0533  Gross per 24 hour  Intake   1749 ml  Output   1450 ml  Net    299 ml    Exam:   General:  Pt is alert, follows commands appropriately, not in acute distress  Cardiovascular: Regular rate and rhythm,  [name] appreciated   Respiratory: Clear to auscultation bilaterally, no wheezing, no crackles, no rhonchi  Abdomen: Soft, non tender, non distended, bowel sounds present, no guarding  Extremities: No edema, pulses DP and PT palpable bilaterally; TTP in upper thigh area bilaterally   Neuro: Grossly nonfocal  Data Reviewed: Basic Metabolic  Panel:  Recent Labs Lab 12/30/13 0031 12/30/13 0355  NA 140 141  K 3.7 4.1  CL 104 106  CO2  --  23  GLUCOSE 91 99  BUN 6 8  CREATININE 1.20 1.04  CALCIUM  --  8.6   Liver Function Tests:  Recent Labs Lab 12/30/13 0355  AST 13  ALT 17  ALKPHOS 69  BILITOT <0.2*  PROT 5.6*  ALBUMIN 3.0*   No results found for this basename: LIPASE, AMYLASE,  in the last 168 hours No results found for this basename: AMMONIA,  in the last 168 hours CBC:  Recent Labs Lab 12/30/13 0025 12/30/13 0031 12/30/13 0355  WBC 6.3  --  4.8  NEUTROABS 4.9  --  3.7  HGB 10.9* 11.6* 10.0*  HCT 33.6* 34.0* 31.4*  MCV 95.5  --  96.3  PLT 277  --  238   Cardiac Enzymes: No results found for this basename: CKTOTAL, CKMB, CKMBINDEX, TROPONINI,  in the last 168 hours BNP: No components found with this basename: POCBNP,  CBG: No results found for this basename: GLUCAP,  in the last 168 hours  No results found for this or any previous visit (from the past 240 hour(s)).   Studies: Dg Cervical Spine Complete 12/29/2013   IMPRESSION: Chronic compression deformities involving C4 and C5. No acute osseous abnormalities.   Electronically Signed   By: Margaree Mackintosh M.D.   On: 12/29/2013 23:37   Dg Lumbar Spine Complete 12/29/2013  IMPRESSION: No focal acute osseous abnormalities.  Fusion L5-S1.   Electronically Signed   By: Margaree Mackintosh M.D.   On: 12/29/2013 23:23   Dg Hip Complete Right 12/29/2013    IMPRESSION: No acute osseous abnormalities.  Mild osteoarthritic changes.   Electronically Signed   By: Margaree Mackintosh M.D.   On: 12/29/2013 23:22   Mr Thoracic Spine W Wo Contrast 12/30/2013     IMPRESSION: 1. Suspect previous thoracic/thoracic spine radiation. Superimposed T1 and T9 metastases, the latter with a pathologic compression fracture affecting the at T9 neural foramina, but no spinal cord compression. 2. Reactive marrow changes versus early osseous tumor in the T3-T6 and T11 vertebrae. 3. Bulky  thoracolumbar junction osseous tumor with epidural and extraosseous extension. No malignant lumbar spinal stenosis, but epidural tumor involves the bilateral T12 and right L1 neural foramina. 4. Bulky sacral tumor with extraosseous extension, including impingement of the exiting left S1 nerve. 5. Extensive pulmonary metastatic disease partially visible.   Electronically Signed   By: Lars Pinks M.D.   On: 12/30/2013 19:31   Mr Lumbar Spine W Wo Contrast 12/30/2013     IMPRESSION: 1. Suspect previous thoracic/thoracic spine radiation. Superimposed T1 and T9 metastases, the latter with a pathologic compression fracture affecting the at T9 neural foramina, but no spinal cord compression. 2. Reactive marrow changes versus early osseous tumor in the T3-T6 and T11 vertebrae. 3. Bulky thoracolumbar junction osseous tumor with epidural and extraosseous extension. No malignant lumbar spinal stenosis, but epidural tumor involves the bilateral T12 and right L1 neural foramina. 4. Bulky sacral tumor with extraosseous extension, including impingement of the exiting left S1 nerve. 5. Extensive pulmonary metastatic disease partially  visible.   Electronically Signed   By: Lars Pinks M.D.   On: 12/30/2013 19:31    Scheduled Meds: . dexamethasone  6 mg Intravenous 4 times per day  . docusate sodium  100 mg Oral BID  . levothyroxine  100 mcg Oral QAC breakfast  . multivitamin with minerals  1 tablet Oral Daily  . pantoprazole  40 mg Oral Daily

## 2014-01-01 ENCOUNTER — Encounter (HOSPITAL_COMMUNITY): Payer: Self-pay | Admitting: Emergency Medicine

## 2014-01-01 ENCOUNTER — Emergency Department (HOSPITAL_COMMUNITY)
Admission: EM | Admit: 2014-01-01 | Discharge: 2014-01-01 | Disposition: A | Discharge status: Home / Self Care | Payer: Medicaid Other | Attending: Emergency Medicine | Admitting: Emergency Medicine

## 2014-01-01 ENCOUNTER — Emergency Department (HOSPITAL_COMMUNITY)
Admission: EM | Admit: 2014-01-01 | Discharge: 2014-01-01 | Disposition: A | Discharge status: Home / Self Care | Payer: Medicaid Other | Source: Home / Self Care | Attending: Emergency Medicine | Admitting: Emergency Medicine

## 2014-01-01 DIAGNOSIS — IMO0002 Reserved for concepts with insufficient information to code with codable children: Secondary | ICD-10-CM | POA: Insufficient documentation

## 2014-01-01 DIAGNOSIS — Z87891 Personal history of nicotine dependence: Secondary | ICD-10-CM | POA: Insufficient documentation

## 2014-01-01 DIAGNOSIS — Z85850 Personal history of malignant neoplasm of thyroid: Secondary | ICD-10-CM | POA: Insufficient documentation

## 2014-01-01 DIAGNOSIS — C7951 Secondary malignant neoplasm of bone: Secondary | ICD-10-CM

## 2014-01-01 DIAGNOSIS — I10 Essential (primary) hypertension: Secondary | ICD-10-CM

## 2014-01-01 DIAGNOSIS — Z79899 Other long term (current) drug therapy: Secondary | ICD-10-CM | POA: Insufficient documentation

## 2014-01-01 DIAGNOSIS — C7949 Secondary malignant neoplasm of other parts of nervous system: Secondary | ICD-10-CM | POA: Insufficient documentation

## 2014-01-01 DIAGNOSIS — C801 Malignant (primary) neoplasm, unspecified: Secondary | ICD-10-CM | POA: Insufficient documentation

## 2014-01-01 DIAGNOSIS — E079 Disorder of thyroid, unspecified: Secondary | ICD-10-CM | POA: Insufficient documentation

## 2014-01-01 DIAGNOSIS — G893 Neoplasm related pain (acute) (chronic): Secondary | ICD-10-CM | POA: Insufficient documentation

## 2014-01-01 DIAGNOSIS — Z8701 Personal history of pneumonia (recurrent): Secondary | ICD-10-CM | POA: Insufficient documentation

## 2014-01-01 DIAGNOSIS — M25519 Pain in unspecified shoulder: Secondary | ICD-10-CM | POA: Insufficient documentation

## 2014-01-01 DIAGNOSIS — C73 Malignant neoplasm of thyroid gland: Secondary | ICD-10-CM | POA: Insufficient documentation

## 2014-01-01 DIAGNOSIS — M79609 Pain in unspecified limb: Secondary | ICD-10-CM | POA: Insufficient documentation

## 2014-01-01 DIAGNOSIS — C7931 Secondary malignant neoplasm of brain: Secondary | ICD-10-CM | POA: Insufficient documentation

## 2014-01-01 DIAGNOSIS — M542 Cervicalgia: Secondary | ICD-10-CM | POA: Insufficient documentation

## 2014-01-01 DIAGNOSIS — C7952 Secondary malignant neoplasm of bone marrow: Secondary | ICD-10-CM

## 2014-01-01 DIAGNOSIS — Z85841 Personal history of malignant neoplasm of brain: Secondary | ICD-10-CM | POA: Insufficient documentation

## 2014-01-01 DIAGNOSIS — Z9089 Acquired absence of other organs: Secondary | ICD-10-CM | POA: Insufficient documentation

## 2014-01-01 MED ORDER — PANTOPRAZOLE SODIUM 40 MG PO TBEC: 40.0000 mg | DELAYED_RELEASE_TABLET | Freq: Every day | ORAL | Status: DC

## 2014-01-01 MED ORDER — OXYCODONE HCL 5 MG PO TABS: 5.0000 mg | ORAL_TABLET | ORAL | Status: DC | PRN

## 2014-01-01 MED ORDER — DSS 100 MG PO CAPS: 100.0000 mg | ORAL_CAPSULE | Freq: Two times a day (BID) | ORAL | Status: DC

## 2014-01-01 MED ORDER — DEXAMETHASONE 4 MG PO TABS
4.0000 mg | ORAL_TABLET | Freq: Three times a day (TID) | ORAL | Status: DC
Start: 2014-01-01 — End: 2014-01-07

## 2014-01-01 MED ORDER — HYDROMORPHONE HCL PF 2 MG/ML IJ SOLN
2.0000 mg | Freq: Once | INTRAMUSCULAR | Status: AC
  Administered 2014-01-01: 2 mg via INTRAMUSCULAR
  Filled 2014-01-01: qty 1

## 2014-01-01 NOTE — Discharge Summary (Signed)
Physician Discharge Summary  Frank Ward Comes MEQ:[id number] DOB: 1971/06/13 DOA: 12/29/2013  PCP: [name], MD  Admit date: 12/29/2013 Discharge date: 01/01/2014  Recommendations for Outpatient Follow-up:  Follow up with oncology (Dr. Juliann Mule). Pt declined to have me schedule appt. He is very frustrated with discharge plan and wants IV pain meds. Referral to pain management clinic provided by case manager, Venita Lick.  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 He presented to [facility] ED 12/29/2013 with complaints of intractable neck pain, lower extremity weakness and cramps and reports of falls at home. He recently had MRI brain and cervical spine (in 10/2013) with findings of progressive collapse of pathologic fractures at C3 and to a lesser extent C4; progressive right foraminal and central canal stenosis at C2-3 with mass effect on the spinal cord and mild cord edema. In ED, evaluation included right hip and lumbar spine which did not show acute osseous abnormalities. His cervical spine x ray showed chronic compression deformities involving C4 and C5 but no acute osseous abnormalities. Other studies on this admission included MRI lumbar and thoracic spine which were significant for T1 and T9 metastases, the latter with a  pathologic compression fracture affecting the T9 neural foramina, but no spinal cord compression. Also see was early osseous tumor in the T3-T6 and T11 vertebrae, bulky thoracolumbar junction osseous tumor with epidural and extraosseous extension. No malignant lumbar spinal stenosis but epidural tumor involves the bilateral T12 and right L1 neural foramina; bulky sacral tumor with extraosseous extension, including impingement of the exiting left S1 nerve. Per radiation oncology, further radiation may not be beneficial due to the expense of metastatic disease. Surgical intervention not recommended per neurosurgery.   Assessment/Plan:   Principal Problem:  Uncontrolled cancer related pain secondary to osseous metastases  Secondary to progressive metastases to the spine as indicated above  Palliative care team eval the pt but he is very frustrated when goals of care are discussed. He does not want to hear about prognosis. Current pain regimen is with Dilaudid 1 mg every 2 hours IV as needed which will be switched to PO pain meds prior to discharge   Active Problems:  Hypothyroidism  Continue synthroid 100 mcg daily   Code Status: full code  Family Communication: no family at the bedside   Consultants:  Radiation oncology  Neurosurgery  Palliative care team Procedures:  None  Antibiotics:  None   Signed:  Robbie Lis, MD  [facility] 01/01/2014, 11:07 AM  Pager #: [phone]   Discharge Exam: Filed Vitals:   01/01/14 0545  BP: 123/81  Pulse: 91  Temp: 98.4 F (36.9 C)  Resp: 20   Filed Vitals:   12/31/13 0532 12/31/13 1400 12/31/13 2306 01/01/14 0545  BP: 132/88 162/95 149/92 123/81  Pulse: 79 87 87 91  Temp: 98.2 F (36.8  C) 98.2 F (36.8 C) 97.1 F (36.2 C) 98.4 F (36.9 C)  TempSrc: Oral Oral Oral Oral  Resp: 20 20 20 20   Height:      Weight:      SpO2: 96% 98% 97% 97%    General: Pt is alert, follows commands appropriately, not in acute distress;  has neck collar  Cardiovascular: Regular rate and rhythm, [name] +, no murmurs, no rubs, no gallops Respiratory: Clear to auscultation bilaterally, no wheezing, no crackles, no rhonchi Abdominal: Soft, non tender, non distended, bowel sounds +, no guarding Extremities: no edema, no cyanosis, pulses palpable bilaterally DP and PT; pain in bilateral LE Neuro: Grossly nonfocal  Discharge Instructions  Discharge Instructions   Call MD for:  difficulty breathing, headache or visual disturbances    Complete by:  As directed      Call MD for:  persistant dizziness or light-headedness    Complete by:  As directed      Call MD for:  persistant nausea and vomiting    Complete by:  As directed      Call MD for:  severe uncontrolled pain    Complete by:  As directed      Diet - low sodium heart healthy    Complete by:  As directed      Discharge instructions    Complete by:  As directed      Increase activity slowly    Complete by:  As directed             Medication List         dexamethasone 4 MG tablet  Commonly known as:  DECADRON  Take 1 tablet (4 mg total) by mouth 3 (three) times daily.     DSS 100 MG Caps  Take 100 mg by mouth 2 (two) times daily.     ibuprofen 200 MG tablet  Commonly known as:  ADVIL,MOTRIN  Take 200-400 mg by mouth every 6 (six) hours as needed for mild pain.     levothyroxine 100 MCG tablet  Commonly known as:  SYNTHROID, LEVOTHROID  Take 100 mcg by mouth daily before breakfast.     multivitamin with minerals Tabs tablet  Take 1 tablet by mouth daily.     oxyCODONE 5 MG immediate release tablet  Commonly known as:  ROXICODONE  Take 1 tablet (5 mg total) by mouth every 4 (four) hours as needed for severe pain.     pantoprazole 40 MG tablet  Commonly known as:  PROTONIX  Take 1 tablet (40 mg total) by mouth daily.     traMADol 50 MG tablet  Commonly known as:  ULTRAM  Take 50 mg by mouth at bedtime as needed for moderate pain.            Follow-up Information   Follow up with AVBUERE,EDWIN A, MD. Schedule an appointment as soon as possible for a visit in 1 week.   Specialty:  Internal Medicine   Contact information:   [address] [place] [address] [phone]        The results of significant diagnostics from this hospitalization (including imaging, microbiology, ancillary and laboratory) are listed below for reference.    Significant Diagnostic Studies: Dg Chest 2 View  12/09/2013   CLINICAL DATA:  Right-sided neck pain and numbness. History of metastatic thyroid carcinoma  EXAM: CHEST  2 VIEW  COMPARISON:  Prior radiograph from 09/12/2013.  FINDINGS: Cardiac and mediastinal silhouettes are stable in size and  contour, and remain within normal limits.  Multiple bilateral pulmonary nodules are again seen, increased in size as compared to prior study, compatible with progressive disease. More confluent opacity within the left perihilar region may represent superimposed pneumonia. No pulmonary edema or pleural effusion. There is no pneumothorax.  No acute osseous abnormality.  IMPRESSION: 1. Interval worsening in diffuse pulmonary metastases as compared to prior radiograph from 09/12/2013. 2. More confluent opacity within the left perihilar region, which may reflect atelectasis or superimposed infiltrate.   Electronically Signed   By: Jeannine Boga M.D.   On: 12/09/2013 14:54   Dg Cervical Spine Complete  12/29/2013   CLINICAL DATA:  hx cancer, r/o pathologic fx  EXAM: CERVICAL SPINE  4+ VIEWS  COMPARISON:  MR HEAD WO/W CM dated 12/26/2013; DG CHEST 2 VIEW dated 12/09/2013; DG CERVICAL SPINE COMPLETE dated 10/16/2013  FINDINGS: Stable chronic compression fractures involving C4 and C5. Degenerative disc disease changes within the lower cervical spine. No acute osseous abnormalities are appreciated. Stable pathologic fractures involving C4 and C5. No acute osseous abnormalities. There are areas of degenerative disc  disease change within the mid and lower cervical spine.  IMPRESSION: Chronic compression deformities involving C4 and C5. No acute osseous abnormalities.   Electronically Signed   By: Margaree Mackintosh M.D.   On: 12/29/2013 23:37   Dg Lumbar Spine Complete  12/29/2013   CLINICAL DATA:  hx cancer, r/o pathologic fx  EXAM: LUMBAR SPINE - COMPLETE 4+ VIEW  COMPARISON:  None.  FINDINGS: There is no evidence of lumbar spine fracture. Alignment is normal. Intervertebral disc spaces are maintained. There is fusion of L5-S1. No lytic nor blastic lesions are appreciated.  IMPRESSION: No focal acute osseous abnormalities.  Fusion L5-S1.   Electronically Signed   By: Margaree Mackintosh M.D.   On: 12/29/2013 23:23   Dg Hip Complete Right  12/29/2013   CLINICAL DATA:  hx metastatic cancer, r/o pathologic fx  EXAM: RIGHT HIP - COMPLETE 2+ VIEW  COMPARISON:  None.  FINDINGS: There is no evidence of hip fracture or dislocation. Areas of peripheral hypertrophic spurring involving the acetabulum and lower border of the femoral head. Soft tissues unremarkable.  IMPRESSION: No acute osseous abnormalities.  Mild osteoarthritic changes.   Electronically Signed   By: Margaree Mackintosh M.D.   On: 12/29/2013 23:22   Mr Jeri Cos [id number] Contrast  12/26/2013   CLINICAL DATA:  SRS targeting.  Thyroid cancer.  EXAM: MRI HEAD WITHOUT AND WITH CONTRAST  TECHNIQUE: Multiplanar, multiecho pulse sequences of the brain and surrounding structures were obtained without and with intravenous contrast.  CONTRAST:  47mL MULTIHANCE GADOBENATE DIMEGLUMINE 529 MG/ML IV SOLN  COMPARISON:  Brain MRI of 08/28/2013  FINDINGS: Osseous metastatic disease in the cervical spine is more fully evaluated on separate cervical spine MRI.  There is no acute infarct or intracranial hemorrhage. Enhancing mass in the fourth ventricle has decreased in size, measuring 18 x 15 x 13 mm (series 10, image 37, previously 23 x 18 x 17 mm). 4 mm enhancing lesion in the right central sulcus  is unchanged (series 10, image 114). 8 x 8 mm enhancing lesion in the atrium of the right lateral ventricle is unchanged (series 10, image 84). No new brain lesions are identified. Skull lesions noted on the prior study demonstrate increased precontrast T1 signal and no definite enhancement and are not definitely indicative of osseous metastatic disease.  There is mild T2 hyperintensity in the periventricular white matter and inferior pons/medulla,  nonspecific but may be at least partly reflective of post treatment changes. There is slightly greater ventricular enlargement than on the prior study without frank hydrocephalus seen. There is no midline shift or extra-axial fluid collection.  Right frontal sinus fluid/mucosal thickening is noted. Mucous retention cysts are again seen in the maxillary sinuses. There are small bilateral mastoid effusions.  IMPRESSION: 1. Decreased size of lesion in the fourth ventricle. Unchanged size of lesions in the right central sulcus and right lateral ventricle. 2. No evidence of new intracranial metastases. 3. Slightly increased ventricular dilatation without definite evidence of hydrocephalus.   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 Thyroid cancer with bone metastases.  EXAM: MRI CERVICAL SPINE WITHOUT AND WITH CONTRAST  TECHNIQUE: Multiplanar and multiecho pulse sequences of the cervical spine, to include the craniocervical junction and cervicothoracic junction, were obtained according to standard protocol without and with intravenous contrast.  CONTRAST:  17 mL MultiHance  COMPARISON:  MRI of the cervical spine 08/28/2013  FINDINGS: Mild diffuse cord signal abnormality is now evident at the C3 level. The craniocervical junction is within normal limits.  C2-3: A rightward disc osteophyte complex is present. There is tumor  infiltration into the right neural foramen and developing the right pedicle and posterior lateral mass of C3. There is progressive encroachment on the canal at the C3 level with further retropulsion of bone. The canal is now narrowed to 9 mm. Mild left facet hypertrophy is evident.  C3-4: Retropulsed bone and tumor effaces the right-sided PICC now with progression since prior exam. Progressive right foraminal stenosis is evident. The left foramen is patent.  C4-5: Mild right-sided uncovertebral disease is evident. Mild foraminal narrowing is present bilaterally without change.  C5-6: Left-sided uncovertebral spurring is present with mild left foraminal stenosis. The central canal and right foramen are patent.  C6-7:  Negative.  C7-T1:  Negative.  T1-2: An inferior vertebral body metastasis is present without posterior extension. There is no significant extraosseous tumor or associated stenosis.  Multiple left-sided lung nodules are present in the upper lobe, measuring up to 16 mm these were not previously imaged. There is some progression since the CT scan December 2014.  IMPRESSION: 1. Progressive collapse of pathologic fractures at C3 and to a lesser extent C4. 2. Progressive right foraminal and central canal stenosis at C2-3. 3. Narrowing of the central canal to 9 mm at the level of C3 with mass effect on the spinal cord and mild cord edema. 4. Progressive tumor encroachment into the spinal canal at the C3 level. 5. Progressive right foraminal stenosis at C3-4. 6. Mild spondylosis at C4-5 and C5-6 is stable. 7. Metastasis along the inferior aspect of the T1 vertebral body is noted without posterior extension. 8. Multiple pulmonary nodules have increased in size since the prior studies. The largest lesion is a medial upper lobe lesion on the left measuring 16 mm. Critical Value/emergent results were called by telephone at the time of interpretation on 12/26/2013 at 4:18 PM to Dr. [name] , who verbally  acknowledged these results. And   Electronically Signed   By: Lawrence Santiago M.D.   On: 12/26/2013 16:18   Mr Thoracic Spine W Wo Contrast  12/30/2013   CLINICAL DATA:  43 year old male with metastatic thyroid cancer. Pain and weakness in both lower extremities. Staging.  EXAM: MRI THORACIC AND LUMBAR SPINE WITHOUT AND WITH CONTRAST  TECHNIQUE: Multiplanar and multiecho pulse sequences of the thoracic and lumbar spine were obtained without and with intravenous contrast.  CONTRAST:  80mL MULTIHANCE GADOBENATE DIMEGLUMINE 529 MG/ML IV SOLN  COMPARISON:  Brain and cervical spine MRI 12/26/2013. CT Abdomen and Pelvis 09/05/2013.  FINDINGS: MR THORACIC SPINE FINDINGS  See recent comparison for cervical spine findings.  Increased T1 signal in some thoracic vertebral bodies suggesting previous widespread spinal radiation.  Decreased T1 signal metastases in the T1 vertebral body (series 9, image 5).  Mottle decreased T1 signal throughout the T3, T4, T5, much of the T6, and T11 vertebral bodies could be red marrow reactivation or early metastatic disease.  Near complete replacement of the T9 vertebral body and left pedicle by tumor. Mild collapse of that level. Heterogeneous postcontrast enhancement associated. Despite these findings, no epidural tumor or spinal stenosis at this level. There is left greater than right T9 foraminal stenosis related to the collapse and left pedicle expansion.  There is a partially exophytic tumor arising from the left T12 vertebral body anteriorly and laterally (series 9, image 49). Associated epidural tumor in the left T12 neural foramen resulting in foraminal stenosis (series 6, image 12). Bulky posterior element tumor at this level, contiguous with the upper lumbar tumor described below.  Superimposed small degenerative disc herniation at T7-T8. No thoracic spinal stenosis. Spinal cord signal is within normal limits at all visualized levels. No abnormal thoracic intradural enhancement.   Widespread signal abnormality in the lungs compatible with widespread pulmonary metastases.  MR LUMBAR SPINE FINDINGS  Bulky osseous and extraosseous tumor at the thoracolumbar junction encompassing up to 8 x 6 x 8 cm. Tumors tracking cephalad and caudal in the right deep paraspinal soft tissues. The right L1 posterior elements including the pedicle are obliterated. The T12 spinous process is largely obliterated. There is some epidural tumor in both T12 neural foramina, worse on the left). There is early epidural tumor in the right L1 neural foramina. Early right posterior lateral epidural tumor, but no narrowing of the spinal canal at this time.  Early involvement of the right L2 superior articulating facet by the bulky right spinal and paraspinal tumor described at L1. Otherwise the L2 and L3 levels are spared.  Bulky sacral tumor, replacing the right S1 and S2 a ala and obliterating the right SI joint and some of the medial right iliac bone. Tumor then tracks anteriorly in the right iliacus muscle (series 14, image 35). The S1 level is expanded, and epidural tumor impinges the left S1 nerve root (series 15, image 37). The sacral tumor on the right involves some of the right L5 inferior articulating facet, and partially obliterates that facet joint. There are otherwise small focal tumors in the inferior L4 and posterior superior L5 vertebral bodies.  Despite the above findings, Visualized lower thoracic spinal cord is normal with conus medularis at L1. No lumbar spinal stenosis. No thickening or abnormal enhancement of the cauda equina.  Stable visualized abdominal viscera.  IMPRESSION: 1. Suspect previous thoracic/thoracic spine radiation. Superimposed T1 and T9 metastases, the latter with a pathologic compression fracture affecting the at T9 neural foramina, but no spinal cord compression. 2. Reactive marrow changes versus early osseous tumor in the T3-T6 and T11 vertebrae. 3. Bulky thoracolumbar junction  osseous tumor with epidural and extraosseous extension. No malignant lumbar spinal stenosis, but epidural tumor involves the bilateral T12 and right L1 neural foramina. 4. Bulky sacral tumor  with extraosseous extension, including impingement of the exiting left S1 nerve. 5. Extensive pulmonary metastatic disease partially visible.   Electronically Signed   By: Lars Pinks M.D.   On: 12/30/2013 19:31   Mr Lumbar Spine W Wo Contrast  12/30/2013   CLINICAL DATA:  43 year old male with metastatic thyroid cancer. Pain and weakness in both lower extremities. Staging.  EXAM: MRI THORACIC AND LUMBAR SPINE WITHOUT AND WITH CONTRAST  TECHNIQUE: Multiplanar and multiecho pulse sequences of the thoracic and lumbar spine were obtained without and with intravenous contrast.  CONTRAST:  65mL MULTIHANCE GADOBENATE DIMEGLUMINE 529 MG/ML IV SOLN  COMPARISON:  Brain and cervical spine MRI 12/26/2013. CT Abdomen and Pelvis 09/05/2013.  FINDINGS: MR THORACIC SPINE FINDINGS  See recent comparison for cervical spine findings.  Increased T1 signal in some thoracic vertebral bodies suggesting previous widespread spinal radiation.  Decreased T1 signal metastases in the T1 vertebral body (series 9, image 5).  Mottle decreased T1 signal throughout the T3, T4, T5, much of the T6, and T11 vertebral bodies could be red marrow reactivation or early metastatic disease.  Near complete replacement of the T9 vertebral body and left pedicle by tumor. Mild collapse of that level. Heterogeneous postcontrast enhancement associated. Despite these findings, no epidural tumor or spinal stenosis at this level. There is left greater than right T9 foraminal stenosis related to the collapse and left pedicle expansion.  There is a partially exophytic tumor arising from the left T12 vertebral body anteriorly and laterally (series 9, image 49). Associated epidural tumor in the left T12 neural foramen resulting in foraminal stenosis (series 6, image 12). Bulky  posterior element tumor at this level, contiguous with the upper lumbar tumor described below.  Superimposed small degenerative disc herniation at T7-T8. No thoracic spinal stenosis. Spinal cord signal is within normal limits at all visualized levels. No abnormal thoracic intradural enhancement.  Widespread signal abnormality in the lungs compatible with widespread pulmonary metastases.  MR LUMBAR SPINE FINDINGS  Bulky osseous and extraosseous tumor at the thoracolumbar junction encompassing up to 8 x 6 x 8 cm. Tumors tracking cephalad and caudal in the right deep paraspinal soft tissues. The right L1 posterior elements including the pedicle are obliterated. The T12 spinous process is largely obliterated. There is some epidural tumor in both T12 neural foramina, worse on the left). There is early epidural tumor in the right L1 neural foramina. Early right posterior lateral epidural tumor, but no narrowing of the spinal canal at this time.  Early involvement of the right L2 superior articulating facet by the bulky right spinal and paraspinal tumor described at L1. Otherwise the L2 and L3 levels are spared.  Bulky sacral tumor, replacing the right S1 and S2 a ala and obliterating the right SI joint and some of the medial right iliac bone. Tumor then tracks anteriorly in the right iliacus muscle (series 14, image 35). The S1 level is expanded, and epidural tumor impinges the left S1 nerve root (series 15, image 37). The sacral tumor on the right involves some of the right L5 inferior articulating facet, and partially obliterates that facet joint. There are otherwise small focal tumors in the inferior L4 and posterior superior L5 vertebral bodies.  Despite the above findings, Visualized lower thoracic spinal cord is normal with conus medularis at L1. No lumbar spinal stenosis. No thickening or abnormal enhancement of the cauda equina.  Stable visualized abdominal viscera.  IMPRESSION: 1. Suspect previous  thoracic/thoracic spine radiation. Superimposed T1 and T9 metastases,  the latter with a pathologic compression fracture affecting the at T9 neural foramina, but no spinal cord compression. 2. Reactive marrow changes versus early osseous tumor in the T3-T6 and T11 vertebrae. 3. Bulky thoracolumbar junction osseous tumor with epidural and extraosseous extension. No malignant lumbar spinal stenosis, but epidural tumor involves the bilateral T12 and right L1 neural foramina. 4. Bulky sacral tumor with extraosseous extension, including impingement of the exiting left S1 nerve. 5. Extensive pulmonary metastatic disease partially visible.   Electronically Signed   By: Lars Pinks M.D.   On: 12/30/2013 19:31    Microbiology: No results found for this or any previous visit (from the past 240 hour(s)).   Labs: Basic Metabolic Panel:  Recent Labs Lab 12/30/13 0031 12/30/13 0355  NA 140 141  K 3.7 4.1  CL 104 106  CO2  --  23  GLUCOSE 91 99  BUN 6 8  CREATININE 1.20 1.04  CALCIUM  --  8.6   Liver Function Tests:  Recent Labs Lab 12/30/13 0355  AST 13  ALT 17  ALKPHOS 69  BILITOT <0.2*  PROT 5.6*  ALBUMIN 3.0*   No results found for this basename: LIPASE, AMYLASE,  in the last 168 hours No results found for this basename: AMMONIA,  in the last 168 hours CBC:  Recent Labs Lab 12/30/13 0025 12/30/13 0031 12/30/13 0355  WBC 6.3  --  4.8  NEUTROABS 4.9  --  3.7  HGB 10.9* 11.6* 10.0*  HCT 33.6* 34.0* 31.4*  MCV 95.5  --  96.3  PLT 277  --  238   Cardiac Enzymes: No results found for this basename: CKTOTAL, CKMB, CKMBINDEX, TROPONINI,  in the last 168 hours BNP: BNP (last 3 results) No results found for this basename: PROBNP,  in the last 8760 hours CBG: No results found for this basename: GLUCAP,  in the last 168 hours  Time coordinating discharge: Over 30 minutes

## 2014-01-01 NOTE — Progress Notes (Signed)
Pt discharged in stable condition. Discharge instructions and scripts given to patient. Pt verbalized understanding.

## 2014-01-01 NOTE — ED Notes (Signed)
Pt refusing vital signs multiple times RN will be made aware

## 2014-01-01 NOTE — ED Provider Notes (Signed)
CSN: [id number]     Arrival date & time 01/01/14  1849 History   First MD Initiated Contact with Patient 01/01/14 2007     Chief Complaint  Patient presents with  . Back Pain  . Leg Pain  . Shoulder Pain  . Neck Pain     (Consider location/radiation/quality/duration/timing/severity/associated sxs/prior Treatment) The history is provided by the patient.  Frank Ward is a 43 y.o. 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 Recently admitted for pain control and discharged today. He never left and went to the ED for wanting a second opinion and pain meds. Oncology saw him previously and recommended palliative but he is in denial. He was sent home but still lingered in the ED and wanted pain meds. Has prescription of ibuprofen, tramadol, percocet.    Past Medical History  Diagnosis Date  . Cancer     Thyroid  . Thyroid disease   . Hypertension   . Pneumonia    Past Surgical History  Procedure Laterality Date  . Tendon repair    . Thyroidectomy     Family History  Problem Relation Age of Onset  . Diabetes Mother   . Diabetes Brother    History  Substance Use Topics  . Smoking status: Former Smoker -- 10 years    Types: Cigarettes    Quit date: 08/29/2011  . Smokeless tobacco: Never Used  . Alcohol Use: 4.8 oz/week    8 Cans of beer per week     Comment: occ    Review of Systems  Musculoskeletal: Positive for back pain and neck pain.  All other systems reviewed and are negative.     Allergies  No known allergies  Home Medications   Prior to Admission medications   Medication Sig Start Date End Date Taking? Authorizing Provider  dexamethasone (DECADRON) 4 MG tablet Take 1 tablet (4 mg total) by mouth 3 (three) times daily. 01/01/14  Yes Robbie Lis, MD  ibuprofen (ADVIL,MOTRIN) 200 MG tablet Take 200-400 mg by mouth every 6 (six) hours as needed for mild pain.   Yes Historical Provider, MD  levothyroxine  (SYNTHROID, LEVOTHROID) 100 MCG tablet Take 100 mcg by mouth daily before breakfast.    Yes Historical Provider, MD  Multiple Vitamin (MULTIVITAMIN WITH MINERALS) TABS tablet Take 1 tablet by mouth daily.   Yes Historical Provider, MD  OVER THE COUNTER MEDICATION Take 1 tablet by mouth daily. Green tea   Yes Historical Provider, MD  OVER THE COUNTER MEDICATION Take 1 tablet by mouth at bedtime. OTC arthritis supplement   Yes Historical Provider, MD  OVER THE COUNTER MEDICATION Take 1 tablet by mouth 3 (three) times daily. OTC supplement:Grandma's herb   Yes Historical Provider, MD  traMADol (ULTRAM) 50 MG tablet Take 50 mg by mouth at bedtime as needed for moderate pain.    Yes Historical Provider, MD  docusate sodium 100 MG CAPS Take 100 mg by mouth 2 (two) times daily. 01/01/14   Robbie Lis, MD  oxyCODONE (ROXICODONE) 5 MG immediate release tablet Take 1 tablet (5 mg total) by mouth every 4 (four) hours as needed for severe pain. 01/01/14   Robbie Lis, MD  pantoprazole (PROTONIX) 40 MG tablet Take 1 tablet (40 mg total) by mouth daily. 01/01/14   Robbie Lis, MD   BP 124/95  Pulse 122  Temp(Src) 98.1 F (36.7 C) (Oral)  Resp 18  SpO2 100% Physical  Exam  Nursing note and vitals reviewed. Constitutional:  Upset, angry   HENT:  Head: Normocephalic.  Mouth/Throat: Oropharynx is clear and moist.  Eyes: Conjunctivae are normal. Pupils are equal, round, and reactive to light.  Neck:  C collar in place   Cardiovascular: Normal rate.   Pulmonary/Chest: Effort normal and breath sounds normal.  Abdominal: Soft. Bowel sounds are normal.  Musculoskeletal: Normal range of motion.  Neurological: He is alert.  Skin: Skin is warm.  Psychiatric:  Upset, not suicidal or homicidal.     ED Course  Procedures (including critical care time) Labs Review Labs Reviewed - No data to display  Imaging Review No results found.   EKG Interpretation None      MDM   Final diagnoses:  None     Frank Ward is a 43 y.o. male here wanting pain meds. Gave him one dose of IM dilaudid. His brother will pick him up. He has pain meds at home. Stable for d/c.     Wandra Arthurs, MD 01/01/14 2137

## 2014-01-01 NOTE — ED Notes (Signed)
Attempted to call for pt in the waiting room.  Pt not found but belongings in the lobby.

## 2014-01-01 NOTE — ED Provider Notes (Signed)
CSN: [id number]     Arrival date & time 01/01/14  1338 History   First MD Initiated Contact with Patient 01/01/14 1341     Chief Complaint  Patient presents with  . Leg Pain      HPI Patient presents to the emergency department with a diagnosis of stage IV cancer with metastatic spread to his brain and bones.  He was discharged from the hospital earlier today after an admission for pain control.  His found by radiation oncology that there is no additional radiation treatments that can be performed to treat this patient's cancer.  It is believed that his only additional option available his medical oncology.  It is not like the medical oncology team here at [facility] long period he states that he continues having pain despite his pain medication.  He has no fevers or chills.  He was discharged from the hospital approximately one hour ago.  I discussed his case with the discharge team who stated that they attempted to have palliative care see the patient on several occasions and the patient refused to talk about palliative care options available to him for his advanced stage cancer.  He seems to be in somewhat of a denial state.    Past Medical History  Diagnosis Date  . Cancer     Thyroid  . Thyroid disease   . Hypertension   . Pneumonia    Past Surgical History  Procedure Laterality Date  . Tendon repair    . Thyroidectomy     Family History  Problem Relation Age of Onset  . Diabetes Mother   . Diabetes Brother    History  Substance Use Topics  . Smoking status: Former Smoker -- 10 years    Types: Cigarettes    Quit date: 08/29/2011  . Smokeless tobacco: Never Used  . Alcohol Use: 4.8 oz/week    8 Cans of beer per week     Comment: occ    Review of Systems  All other systems reviewed and are negative.     Allergies  No known allergies  Home Medications   Prior to Admission medications   Medication Sig Start Date End Date Taking? Authorizing Provider  dexamethasone  (DECADRON) 4 MG tablet Take 1 tablet (4 mg total) by mouth 3 (three) times daily. 01/01/14  Yes Robbie Lis, MD  docusate sodium 100 MG CAPS Take 100 mg by mouth 2 (two) times daily. 01/01/14  Yes Robbie Lis, MD  ibuprofen (ADVIL,MOTRIN) 200 MG tablet Take 200-400 mg by mouth every 6 (six) hours as needed for mild pain.   Yes Historical Provider, MD  levothyroxine (SYNTHROID, LEVOTHROID) 100 MCG tablet Take 100 mcg by mouth daily before breakfast.    Yes Historical Provider, MD  Multiple Vitamin (MULTIVITAMIN WITH MINERALS) TABS tablet Take 1 tablet by mouth daily.   Yes Historical Provider, MD  oxyCODONE (ROXICODONE) 5 MG immediate release tablet Take 1 tablet (5 mg total) by mouth every 4 (four) hours as needed for severe pain. 01/01/14  Yes Robbie Lis, MD  pantoprazole (PROTONIX) 40 MG tablet Take 1 tablet (40 mg total) by mouth daily. 01/01/14  Yes Robbie Lis, MD  traMADol (ULTRAM) 50 MG tablet Take 50 mg by mouth at bedtime as needed for moderate pain.    Yes Historical Provider, MD   There were no vitals taken for this visit. Physical Exam  Nursing note and vitals reviewed. Constitutional: He is oriented to person, place, and time.  He appears well-developed and well-nourished.  HENT:  Head: Normocephalic.  Eyes: EOM are normal.  Neck: Normal range of motion.  Pulmonary/Chest: Effort normal.  Abdominal: He exhibits no distension.  Musculoskeletal: Normal range of motion.  Neurological: He is alert and oriented to person, place, and time.  Psychiatric: He has a normal mood and affect.    ED Course  Procedures (including critical care time) Labs Review Labs Reviewed - No data to display  Imaging Review    EKG Interpretation None      MDM   Final diagnoses:  Cancer associated pain    Had a long discussion with the patient.  The patient seems extremely frustrated with this process S4.  He's interested in a second opinion regarding his advanced cancer.  I recommended  that he call one of the tertiary care cancer center such as [facility], [facility], [facility].  He still seems frustrated with this answer and has more questions that I am able to answer.  Medical screening examination completed.  No life-threatening emergency he needs to be stabilized at this time.  The patient is safe for discharge in the emergency department to continue working on his long-term treatment options as an outpatient.  He has Percocet, tramadol, ibuprofen at home for his symptoms.  He is in a cervical collar.    Hoy Morn, MD 01/01/14 [phone]

## 2014-01-01 NOTE — ED Notes (Signed)
AVS given and explained. Would not e-sign and refused vitals. Attempting to explain AVS to patient and resources for receiving a second opinion from a different medical center. Patient refused. Ambulatory with steady gait and cervical collar still intact.

## 2014-01-01 NOTE — Evaluation (Signed)
Occupational Therapy Evaluation Patient Details Name: Frank Ward MRN: [id number] DOB: February 12, 1971 Today's Date: 01/01/2014    History of Present Illness 43 y.o. male with past medical history of thyroid cancer with widespread metastases to spine and lungs. He has completed whole brain radiation, RT to upper cervical spine, right scapular region and C7 to T3 spine. He presented to [facility] ED 12/29/2013 with complaints of intractable neck pain, lower extremity weakness and cramps and reports of falls at home. Imaging showed multiple bony metastases including T9 pathologic compression fx and S1 nerve impingement, as well as mets to lung.    Clinical Impression   This 43 year old man was admitted with the above problems.  A limited evaluation was completed by OT, due to agitation. Pt did not reveal much about PLOF/home but he did say that he had difficulty getting transportation to XRT.  Will continue to assess pt in next session and update goals as needed.  Mod I level goals are set at this time based with limited information.      Follow Up Recommendations  Other (comment) (will make on next visit due to limited evaluation)    Equipment Recommendations  Other (comment) (will make on next visit due to limited evaluation)    Recommendations for Other Services       Precautions / Restrictions Precautions Precautions: Fall Required Braces or Orthoses: Cervical Brace Cervical Brace: Hard collar Restrictions Weight Bearing Restrictions: No      Mobility Bed Mobility Overal bed mobility: Modified Independent                Transfers    Pt did not stand during OT.  Yesterday, he transferred and walked with PT at a supervision level.  Pt states that pain increases when he stands and he felt tightness in thighs.  RN stopped in room and he did not ask for pain medication                  Balance                                            ADL                                          General ADL Comments: Pt agreeable to OT to see if I could make anything easier for him.  He states that he has been getting dressed, but this is difficult.  Pt is very frustrated about his situation and wants XRT.  He became increasingly agitated while I was in with him, asked for paper and pen and wrote my name down.  He feels like he should have XRT first then therapy.  He was able to get to eob at mod I level, when [facility] was raised and got himself back to this position at same level.  He could reach to feet but did not don socks nor stand as agitation increased.  Even though voice was raised, he was very polite.  I was unable to ascertain any home information/DME     Vision                     Perception     Praxis      Pertinent  Vitals/Pain States pain throughout--not rated.  He did say pain increases with standing     Hand Dominance     Extremity/Trunk Assessment Upper Extremity Assessment Upper Extremity Assessment:  (able to lift bil UEs to 90; MMT deferred due to c-spine )  C/o numbness in bil feet       Cervical / Trunk Assessment Cervical / Trunk Assessment:  (pt in hard cervical collar)   Communication Communication Communication: No difficulties   Cognition Arousal/Alertness: Awake/alert Behavior During Therapy: Agitated Overall Cognitive Status: Within Functional Limits for tasks assessed                     General Comments       Exercises       Shoulder Instructions      Home Living                                          Prior Functioning/Environment Level of Independence: Independent with assistive device(s)             OT Diagnosis: Generalized weakness   OT Problem List: Pain;Impaired UE functional use;Impaired sensation;Decreased activity tolerance;Decreased strength;Decreased knowledge of use of DME or AE   OT Treatment/Interventions: Self-care/ADL training;DME  and/or AE instruction;Patient/family education    OT Goals(Current goals can be found in the care plan section) Acute Rehab OT Goals Patient Stated Goal: continue radiation treatment OT Goal Formulation: Patient unable to participate in goal setting Time For Goal Achievement: 01/08/14 Potential to Achieve Goals: Fair ADL Goals Pt Will Transfer to Toilet: with modified independence Additional ADL Goal #1: pt will gather clothes and complete adl at mod I level  OT Frequency: Min 1X/week, more if pt tolerates   Barriers to D/C:            Co-evaluation              End of Session    Activity Tolerance: Treatment limited secondary to agitation Patient left: in bed;with call bell/phone within reach   Time: 9449-6759 OT Time Calculation (min): 14 min Charges:  OT General Charges $OT Visit: 1 Procedure OT Evaluation $Initial OT Evaluation Tier I: 1 Procedure G-Codes:    [name] Jan 04, 2014, 12:00 PM [name], OTR/L [phone] Jan 04, 2014

## 2014-01-01 NOTE — Progress Notes (Signed)
I visited the patient today to discuss his case. The patient has an aggressive malignancy and this has not responded as hoped for, with him having completed a full course of palliative radiation treatment several months ago. In discussing his case, there was some confusion regarding how the treatments he received. The patient did complete his full prescribed dose to the brain and upper cervical spine and further progression has been evident despite this.  The patient did not wish to further discuss his case with me. Given the lack of response and bony changes, further radiation treatment is not warranted at this time to the cervical spine. This was discussed in brain and spine conference. Neurosurgery also did not recommend further consideration of surgery. This would leave I believe systemic treatment as his major option to consider and I encouraged him to followup with medical oncology. The patient does have a cervical collar in place which was recommended at conference.

## 2014-01-01 NOTE — ED Notes (Signed)
Pt seen walking to and from the nurses station. Pt pacing. Pt ambulatory without assistance.

## 2014-01-01 NOTE — ED Notes (Addendum)
Per patient- came from [facility], saw Dr [name] with radiology and said "there is nothing I can do for you" and sent pt here. Was hospitalized May 17-20. Pt was at [facility] on Sunday and again was sent home. Was under the impression that he was going to be helped today and says "if no one doesn't help me then I'm going to die soon and now I have PNA." Had two MRIs and Ct scan done this week. Thinks his last radiation treatment was in March. Pt presents with 8/10 pain in the right side of body from head to toe described as "sharp, stabbing" pain. In the right side of the head and face feels like a "tingling." Reports pain in right arm and isn't able to lift arm all the way. Says this has happened before when seen at [facility] and wasn't able to use bilateral legs for a week. Says "I have tumors on the hip, they tell me I don't because they don't know how to use x-rays." Cervical collar on and intact. MD Campos in to see patient.

## 2014-01-01 NOTE — ED Notes (Signed)
Pt states is stage 4 cancer and having increased pain right side; states legs giving out on him x 3 days

## 2014-01-01 NOTE — ED Notes (Signed)
Pt was just discharged from [facility].  States he "was supposed to get dilaudid" and we didn't give it to him.  Requesting pain meds.

## 2014-01-01 NOTE — ED Notes (Signed)
Patient states he was at [facility] and discharged on Sunday.  They were giving him Dilaudid and that is the only thing that helps his pain.  Has extensive cancer through out his body.

## 2014-01-01 NOTE — ED Notes (Signed)
Will do vital signs when Dr. Venora Maples leaves pts room

## 2014-01-01 NOTE — Discharge Instructions (Signed)
Bone Metastases °Cancerous growths can begin in any part of the body. The original site of cancer is called the primary tumor or primary cancer (for example, breast cancer). After cancer has developed in one area of the body, cancerous cells from that area can break away and travel through the body's bloodstream. If these cancerous cells begin growing in another place in the body, they are called metastases. Bone metastases are cancer cells that have spread to the bone (which is different from a cancer that starts in the bone). °These secondary growths are like the original tumor. For example, if a prostate cancer spreads to bone it is called metastatic prostate cancer, or prostate cancer metastatic to bone, but not bone cancer. Cancers can spread to almost any bone; the spine and pelvis are often involved.  °Any type of cancer can spread to the bone, but the most common are breast, lung, kidney, thyroid and prostate cancers. Sometimes the primary tumor is not discovered until there are bone problems. If the primary cancer location cannot be discovered, the cancer is called cancer of unknown primary location. °SYMPTOMS  °Pain in the bones is the main symptom of bone metastases. Some other problems may occur first including: °· Decreased appetite. °· Nausea. °· Muscle weakness. °· Confusion. °· Unusual sleep patterns due to discomfort. °· Overly tired (fatigue). °· Restlessness. °Frail or brittle bones may lead to broken bones (fractures) that lead to learning what is wrong (diagnosis). A tumor often weakens the bones.  °DIAGNOSIS  °Metastatic cancers may be found months or years after or at the same time as the primary tumor. When a second tumor is found in a patient who has been treated for cancer, it is more often a metastasis than another primary tumor.  °The patient's symptoms, physical examination, X-rays and blood tests may suggest a bone metastases. In addition, an examination of tissue or a cell sample  (biopsy) is usually done to find the cancer. This sample is removed with a needle. This tissue sample must be looked at under a microscope to confirm a diagnosis. °TREATMENT  °Options generally include treatments that give relief from symptoms (palliative) or curative. Those with advanced, metastasized cancer may receive treatment focused on pain relief and prolonging life. These treatments depend on the type of cancer and its location.  °Treatment for cancer depends on its type and location. Some of these treatments are: °· Surgery to remove the original tumor and/or to remove parts of the body that produce hormones and other chemicals that make cancer worse. °· Treatment with drugs (chemotherapy). °· Bone marrow transplantations on rare occasions. °· Radiation therapy (radiotherapy). °· Hormonal therapy. °· Pain relieving medications. °Your caregiver will help you understand the likelihood that any particular treatment will be helpful for you. While some treatments aim to cure or control the cancer, others give relief from symptoms only. If you have bone metastases, radiation therapy may be recommended to treat pain (if it is in one main location). Pain medications are available. These include strong medicines like morphine. You may be instructed to take a long-acting pain medication (to control most of your pain) and a short-acting medication to control occasional flares of pain. Pain medication is sometimes also given continuously through a pump. °HOME CARE INSTRUCTIONS  °· Take medications exactly as prescribed. °· Keep any follow-up appointments. °· Pain medications can make you sleepy or confused. Do not drive, climb ladders, or do other dangerous activities while on pain medication. °· Pain medications often   cause constipation. Ask your caregiver for information on stool softeners. °· Do not share your pain medication with others. °SEEK MEDICAL CARE IF:  °· Your bone pain is not controlled. °· You are having  problems or side effects from your medication. °· You have excessive sleepiness or confusion. °SEEK IMMEDIATE MEDICAL CARE IF:  °· You fall and have any injury or pain from the fall. °· You have trouble walking. °· You have numbness or tingling in your legs. °· You develop a sudden significant worsening of your pain. °Document Released: 07/22/2002 Document Revised: 10/24/2011 Document Reviewed: 03/14/2008 °[employer]® Patient Information ©2014 [employer]. ° °

## 2014-01-01 NOTE — Consult Note (Signed)
I have reviewed this case with our NP and agree with the Assessment and Plan as stated.  Marci Polito L. Grisell Bissette, MD MBA The Palliative Medicine Team at [facility] Team Phone: [phone] Pager: [phone]   

## 2014-01-01 NOTE — Care Management Note (Signed)
Cm spoke with patient at the bedside concerning discharge planning. Pt recommendation for HHPT. Pt eligible for [facility] with insurance payer. Pt offered choice for [facility]. Per pt currently residing between friends and family and does not desire community resource at this time. Pt provided with HINN informing patient that upon discharge hospital stay no longer covered by insurance payer. Copy of form place in chart. [facility] Case Manager to continue to follow pt upon discharge.     Venita Lick Rainey Kahrs,MSN,RN [phone]

## 2014-01-01 NOTE — ED Notes (Signed)
Pt states he does not want the prescription for oxycodone, and that his pain is not being managed by oxycodone. Prescription has been shredded. Pt states he wants prescription for dilaudid. Pt refused to sign signature pad.

## 2014-01-01 NOTE — Discharge Instructions (Signed)
Take your pain meds as prescribed.   Follow up with your doctor. You need to see your oncologist.   Return to ER if you have severe pain, worse weakness, unable to walk.

## 2014-01-02 ENCOUNTER — Emergency Department (HOSPITAL_COMMUNITY)
Admission: EM | Admit: 2014-01-02 | Discharge: 2014-01-02 | Disposition: A | Discharge status: Home / Self Care | Payer: Medicaid Other | Source: Home / Self Care | Attending: Emergency Medicine | Admitting: Emergency Medicine

## 2014-01-02 DIAGNOSIS — G893 Neoplasm related pain (acute) (chronic): Secondary | ICD-10-CM

## 2014-01-02 MED ORDER — HYDROMORPHONE HCL 4 MG PO TABS: 4.0000 mg | ORAL_TABLET | ORAL | Status: DC | PRN

## 2014-01-02 MED ORDER — HYDROMORPHONE HCL PF 1 MG/ML IJ SOLN
2.0000 mg | Freq: Once | INTRAMUSCULAR | Status: AC
  Administered 2014-01-02: 2 mg via INTRAMUSCULAR
  Filled 2014-01-02: qty 2

## 2014-01-02 NOTE — ED Provider Notes (Signed)
CSN: [id number]     Arrival date & time 01/01/14  2253 History   First MD Initiated Contact with Patient 01/02/14 [phone]     Chief Complaint  Patient presents with  . Medication Refill     (Consider location/radiation/quality/duration/timing/severity/associated sxs/prior Treatment) HPI 43 year old male presents with pain. He states "I have stage 5 cancer and no one is treating my pain". The patient states that the ultimate helps pain is Dilaudid. He's having no new symptoms except for his chronic oncologic pain. He was seen it was a long ED last night, given a shot of Dilaudid, and sent home with Percocet. He states he's been on Percocet and is nothing to constipate him. He feels like he needs dilaudid as this is the only thing that helped him for pain. He recently was admitted for pain control and refused palliative care.  Past Medical History  Diagnosis Date  . Cancer     Thyroid  . Thyroid disease   . Hypertension   . Pneumonia    Past Surgical History  Procedure Laterality Date  . Tendon repair    . Thyroidectomy     Family History  Problem Relation Age of Onset  . Diabetes Mother   . Diabetes Brother    History  Substance Use Topics  . Smoking status: Former Smoker -- 10 years    Types: Cigarettes    Quit date: 08/29/2011  . Smokeless tobacco: Never Used  . Alcohol Use: 4.8 oz/week    8 Cans of beer per week     Comment: occ    Review of Systems  Constitutional: Negative for fever.  Gastrointestinal: Negative for vomiting.  Musculoskeletal:       Diffuse pain  Neurological: Negative for weakness.  All other systems reviewed and are negative.     Allergies  No known allergies  Home Medications   Prior to Admission medications   Medication Sig Start Date End Date Taking? Authorizing Provider  dexamethasone (DECADRON) 4 MG tablet Take 1 tablet (4 mg total) by mouth 3 (three) times daily. 01/01/14  Yes Robbie Lis, MD  docusate sodium 100 MG CAPS Take 100  mg by mouth 2 (two) times daily. 01/01/14  Yes Robbie Lis, MD  ibuprofen (ADVIL,MOTRIN) 200 MG tablet Take 200-400 mg by mouth every 6 (six) hours as needed for mild pain.   Yes Historical Provider, MD  levothyroxine (SYNTHROID, LEVOTHROID) 100 MCG tablet Take 100 mcg by mouth daily before breakfast.    Yes Historical Provider, MD  Multiple Vitamin (MULTIVITAMIN WITH MINERALS) TABS tablet Take 1 tablet by mouth daily.   Yes Historical Provider, MD  OVER THE COUNTER MEDICATION Take 1 tablet by mouth daily. Green tea   Yes Historical Provider, MD  OVER THE COUNTER MEDICATION Take 1 tablet by mouth at bedtime. OTC arthritis supplement   Yes Historical Provider, MD  OVER THE COUNTER MEDICATION Take 1 tablet by mouth 3 (three) times daily. OTC supplement:Grandma's herb   Yes Historical Provider, MD  oxyCODONE (ROXICODONE) 5 MG immediate release tablet Take 1 tablet (5 mg total) by mouth every 4 (four) hours as needed for severe pain. 01/01/14  Yes Robbie Lis, MD  oxyCODONE (ROXICODONE) 5 MG immediate release tablet Take 1 tablet (5 mg total) by mouth every 4 (four) hours as needed for severe pain. 01/01/14  Yes Wandra Arthurs, MD  pantoprazole (PROTONIX) 40 MG tablet Take 1 tablet (40 mg total) by mouth daily. 01/01/14  Yes Link Snuffer  Charlies Silvers, MD  traMADol (ULTRAM) 50 MG tablet Take 50 mg by mouth at bedtime as needed for moderate pain.    Yes Historical Provider, MD   BP 145/96  Pulse 104  Temp(Src) 98.7 F (37.1 C) (Oral)  Resp 24  SpO2 94% Physical Exam  Nursing note and vitals reviewed. Constitutional: He is oriented to person, place, and time. He appears well-developed and well-nourished.  Drinking coffee at bedside  HENT:  Head: Normocephalic and atraumatic.  Right Ear: External ear normal.  Left Ear: External ear normal.  Nose: Nose normal.  Eyes: Right eye exhibits no discharge. Left eye exhibits no discharge.  Neck: Neck supple.  In C-collar  Cardiovascular: Normal rate, regular rhythm,  normal heart sounds and intact distal pulses.   Pulmonary/Chest: Effort normal and breath sounds normal.  Abdominal: Soft. There is no tenderness.  Musculoskeletal: He exhibits no edema.  Neurological: He is alert and oriented to person, place, and time.  Normal strength in all 4 extremities. Normal Gait  Skin: Skin is warm and dry.    ED Course  Procedures (including critical care time) Labs Review Labs Reviewed - No data to display  Imaging Review No results found.   EKG Interpretation None      MDM   Final diagnoses:  Cancer associated pain    Patient is here for pain as he has been several times over past few days. No fevers or new symptoms other than poorly controlled pain. He does not seem to have primary care follow up and says he does not follow with his oncologist anymore. Given IM dose of pain meds here, and will give small dose of oral dilaudid at home, but warned him he needs primary, continuous care for better pain control, possibly even pain clinic. Stable for discharge.     Ephraim Hamburger, MD 01/02/14 [phone]

## 2014-01-02 NOTE — ED Provider Notes (Signed)
Medical screening examination/treatment/procedure(s) were performed by non-physician practitioner and as supervising physician I was immediately available for consultation/collaboration.   EKG Interpretation None       Jasper Riling. Alvino Chapel, MD 01/02/14 [phone]

## 2014-01-02 NOTE — Discharge Instructions (Signed)
Chronic Pain Discharge Instructions  °Emergency care providers appreciate that many patients coming to us are in severe pain and we wish to address their pain in the safest, most responsible manner.  It is important to recognize however, that the proper treatment of chronic pain differs from that of the pain of injuries and acute illnesses.  Our goal is to provide quality, safe, personalized care and we thank you for giving us the opportunity to serve you. °The use of narcotics and related agents for chronic pain syndromes may lead to additional physical and psychological problems.  Nearly as many people die from prescription narcotics each year as die from car crashes.  Additionally, this risk is increased if such prescriptions are obtained from a variety of sources.  Therefore, only your primary care physician or a pain management specialist is able to safely treat such syndromes with narcotic medications long-term.   ° °Documentation revealing such prescriptions have been sought from multiple sources may prohibit us from providing a refill or different narcotic medication.  Your name may be checked first through the [place] Controlled Substances Reporting System.  This database is a record of controlled substance medication prescriptions that the patient has received.  This has been established by [facility] in an effort to eliminate the dangerous, and often life threatening, practice of obtaining multiple prescriptions from different medical providers.  ° °If you have a chronic pain syndrome (i.e. chronic headaches, recurrent back or neck pain, dental pain, abdominal or pelvis pain without a specific diagnosis, or neuropathic pain such as fibromyalgia) or recurrent visits for the same condition without an acute diagnosis, you may be treated with non-narcotics and other non-addictive medicines.  Allergic reactions or negative side effects that may be reported by a patient to such medications will not  typically lead to the use of a narcotic analgesic or other controlled substance as an alternative. °  °Patients managing chronic pain with a personal physician should have provisions in place for breakthrough pain.  If you are in crisis, you should call your physician.  If your physician directs you to the emergency department, please have the doctor call and speak to our attending physician concerning your care. °  °When patients come to the Emergency Department (ED) with acute medical conditions in which the Emergency Department physician feels appropriate to prescribe narcotic or sedating pain medication, the physician will prescribe these in very limited quantities.  The amount of these medications will last only until you can see your primary care physician in his/her office.  Any patient who returns to the ED seeking refills should expect only non-narcotic pain medications.  ° °In the event of an acute medical condition exists and the emergency physician feels it is necessary that the patient be given a narcotic or sedating medication -  a responsible adult driver should be present in the room prior to the medication being given by the nurse. °  °Prescriptions for narcotic or sedating medications that have been lost, stolen or expired will not be refilled in the Emergency Department.   ° °Patients who have chronic pain may receive non-narcotic prescriptions until seen by their primary care physician.  It is every patient’s personal responsibility to maintain active prescriptions with his or her primary care physician or specialist. °

## 2014-01-02 NOTE — ED Notes (Addendum)
Pt states that he went to the cancer center ([facility]) Sunday night and got a shot of dilaudid. Pt states that he is stage 4 cancer (tyroid removed now lung cancer) and was not given a refill for his prescription. Pt states that he got 1 percocet and it did not help. Pt wants a shot of dilaudid for pain.

## 2014-01-07 ENCOUNTER — Inpatient Hospital Stay (HOSPITAL_COMMUNITY)
Admission: EM | Admit: 2014-01-07 | Discharge: 2014-01-27 | DRG: 948 | Disposition: A | Discharge status: Home / Self Care | Payer: Medicaid Other | Attending: Internal Medicine | Admitting: Internal Medicine

## 2014-01-07 ENCOUNTER — Encounter (HOSPITAL_COMMUNITY): Payer: Self-pay | Admitting: Emergency Medicine

## 2014-01-07 ENCOUNTER — Telehealth: Payer: Self-pay | Admitting: *Deleted

## 2014-01-07 ENCOUNTER — Ambulatory Visit
Admit: 2014-01-07 | Discharge: 2014-01-07 | Disposition: A | Discharge status: Home / Self Care | Payer: Medicaid Other | Attending: Radiation Oncology | Admitting: Radiation Oncology

## 2014-01-07 DIAGNOSIS — F411 Generalized anxiety disorder: Secondary | ICD-10-CM | POA: Diagnosis present

## 2014-01-07 DIAGNOSIS — I517 Cardiomegaly: Secondary | ICD-10-CM

## 2014-01-07 DIAGNOSIS — Z923 Personal history of irradiation: Secondary | ICD-10-CM

## 2014-01-07 DIAGNOSIS — Z87891 Personal history of nicotine dependence: Secondary | ICD-10-CM

## 2014-01-07 DIAGNOSIS — F39 Unspecified mood [affective] disorder: Secondary | ICD-10-CM | POA: Diagnosis present

## 2014-01-07 DIAGNOSIS — D649 Anemia, unspecified: Secondary | ICD-10-CM

## 2014-01-07 DIAGNOSIS — S129XXA Fracture of neck, unspecified, initial encounter: Secondary | ICD-10-CM

## 2014-01-07 DIAGNOSIS — M8448XA Pathological fracture, other site, initial encounter for fracture: Secondary | ICD-10-CM | POA: Diagnosis present

## 2014-01-07 DIAGNOSIS — Z5987 Material hardship due to limited financial resources, not elsewhere classified: Secondary | ICD-10-CM

## 2014-01-07 DIAGNOSIS — D630 Anemia in neoplastic disease: Secondary | ICD-10-CM | POA: Diagnosis present

## 2014-01-07 DIAGNOSIS — C7952 Secondary malignant neoplasm of bone marrow: Secondary | ICD-10-CM

## 2014-01-07 DIAGNOSIS — C801 Malignant (primary) neoplasm, unspecified: Secondary | ICD-10-CM

## 2014-01-07 DIAGNOSIS — G589 Mononeuropathy, unspecified: Secondary | ICD-10-CM | POA: Diagnosis present

## 2014-01-07 DIAGNOSIS — R42 Dizziness and giddiness: Secondary | ICD-10-CM

## 2014-01-07 DIAGNOSIS — Z66 Do not resuscitate: Secondary | ICD-10-CM | POA: Diagnosis present

## 2014-01-07 DIAGNOSIS — Z598 Other problems related to housing and economic circumstances: Secondary | ICD-10-CM

## 2014-01-07 DIAGNOSIS — M899 Disorder of bone, unspecified: Secondary | ICD-10-CM

## 2014-01-07 DIAGNOSIS — F22 Delusional disorders: Secondary | ICD-10-CM | POA: Diagnosis present

## 2014-01-07 DIAGNOSIS — Z5900 Homelessness unspecified: Secondary | ICD-10-CM

## 2014-01-07 DIAGNOSIS — M542 Cervicalgia: Secondary | ICD-10-CM

## 2014-01-07 DIAGNOSIS — IMO0002 Reserved for concepts with insufficient information to code with codable children: Secondary | ICD-10-CM

## 2014-01-07 DIAGNOSIS — R209 Unspecified disturbances of skin sensation: Secondary | ICD-10-CM | POA: Diagnosis present

## 2014-01-07 DIAGNOSIS — C7951 Secondary malignant neoplasm of bone: Secondary | ICD-10-CM

## 2014-01-07 DIAGNOSIS — L02419 Cutaneous abscess of limb, unspecified: Secondary | ICD-10-CM | POA: Diagnosis not present

## 2014-01-07 DIAGNOSIS — R609 Edema, unspecified: Secondary | ICD-10-CM | POA: Diagnosis present

## 2014-01-07 DIAGNOSIS — M4802 Spinal stenosis, cervical region: Secondary | ICD-10-CM | POA: Diagnosis present

## 2014-01-07 DIAGNOSIS — F333 Major depressive disorder, recurrent, severe with psychotic symptoms: Secondary | ICD-10-CM

## 2014-01-07 DIAGNOSIS — L03119 Cellulitis of unspecified part of limb: Secondary | ICD-10-CM

## 2014-01-07 DIAGNOSIS — I10 Essential (primary) hypertension: Secondary | ICD-10-CM | POA: Diagnosis present

## 2014-01-07 DIAGNOSIS — Z833 Family history of diabetes mellitus: Secondary | ICD-10-CM

## 2014-01-07 DIAGNOSIS — C7949 Secondary malignant neoplasm of other parts of nervous system: Secondary | ICD-10-CM

## 2014-01-07 DIAGNOSIS — E890 Postprocedural hypothyroidism: Secondary | ICD-10-CM | POA: Diagnosis present

## 2014-01-07 DIAGNOSIS — R200 Anesthesia of skin: Secondary | ICD-10-CM | POA: Diagnosis present

## 2014-01-07 DIAGNOSIS — Z9119 Patient's noncompliance with other medical treatment and regimen: Secondary | ICD-10-CM

## 2014-01-07 DIAGNOSIS — E079 Disorder of thyroid, unspecified: Secondary | ICD-10-CM

## 2014-01-07 DIAGNOSIS — M7989 Other specified soft tissue disorders: Secondary | ICD-10-CM

## 2014-01-07 DIAGNOSIS — M79609 Pain in unspecified limb: Secondary | ICD-10-CM | POA: Diagnosis present

## 2014-01-07 DIAGNOSIS — F1210 Cannabis abuse, uncomplicated: Secondary | ICD-10-CM | POA: Diagnosis present

## 2014-01-07 DIAGNOSIS — Z91199 Patient's noncompliance with other medical treatment and regimen due to unspecified reason: Secondary | ICD-10-CM

## 2014-01-07 DIAGNOSIS — C73 Malignant neoplasm of thyroid gland: Secondary | ICD-10-CM | POA: Diagnosis present

## 2014-01-07 DIAGNOSIS — Z515 Encounter for palliative care: Secondary | ICD-10-CM

## 2014-01-07 DIAGNOSIS — C7931 Secondary malignant neoplasm of brain: Secondary | ICD-10-CM | POA: Diagnosis present

## 2014-01-07 DIAGNOSIS — R531 Weakness: Secondary | ICD-10-CM

## 2014-01-07 DIAGNOSIS — M949 Disorder of cartilage, unspecified: Secondary | ICD-10-CM

## 2014-01-07 DIAGNOSIS — F29 Unspecified psychosis not due to a substance or known physiological condition: Secondary | ICD-10-CM | POA: Diagnosis present

## 2014-01-07 DIAGNOSIS — M25519 Pain in unspecified shoulder: Secondary | ICD-10-CM

## 2014-01-07 DIAGNOSIS — G893 Neoplasm related pain (acute) (chronic): Principal | ICD-10-CM | POA: Diagnosis present

## 2014-01-07 DIAGNOSIS — C7800 Secondary malignant neoplasm of unspecified lung: Secondary | ICD-10-CM | POA: Diagnosis present

## 2014-01-07 DIAGNOSIS — M2040 Other hammer toe(s) (acquired), unspecified foot: Secondary | ICD-10-CM

## 2014-01-07 DIAGNOSIS — R600 Localized edema: Secondary | ICD-10-CM | POA: Diagnosis present

## 2014-01-07 DIAGNOSIS — R202 Paresthesia of skin: Secondary | ICD-10-CM

## 2014-01-07 LAB — PROTIME-INR
INR: 0.97 (ref 0.00–1.49)
PROTHROMBIN TIME: 12.7 s (ref 11.6–15.2)

## 2014-01-07 LAB — APTT: aPTT: 27 seconds (ref 24–37)

## 2014-01-07 LAB — CBC WITH DIFFERENTIAL/PLATELET
Basophils Absolute: 0 10*3/uL (ref 0.0–0.1)
Basophils Relative: 1 % (ref 0–1)
EOS PCT: 4 % (ref 0–5)
Eosinophils Absolute: 0.2 10*3/uL (ref 0.0–0.7)
HEMATOCRIT: 33.9 % — AB (ref 39.0–52.0)
HEMOGLOBIN: 10.9 g/dL — AB (ref 13.0–17.0)
LYMPHS PCT: 11 % — AB (ref 12–46)
Lymphs Abs: 0.5 10*3/uL — ABNORMAL LOW (ref 0.7–4.0)
MCH: 31.2 pg (ref 26.0–34.0)
MCHC: 32.2 g/dL (ref 30.0–36.0)
MCV: 97.1 fL (ref 78.0–100.0)
MONO ABS: 0.6 10*3/uL (ref 0.1–1.0)
Monocytes Relative: 12 % (ref 3–12)
Neutro Abs: 3.2 10*3/uL (ref 1.7–7.7)
Neutrophils Relative %: 72 % (ref 43–77)
Platelets: 282 10*3/uL (ref 150–400)
RBC: 3.49 MIL/uL — ABNORMAL LOW (ref 4.22–5.81)
RDW: 15.9 % — ABNORMAL HIGH (ref 11.5–15.5)
WBC: 4.5 10*3/uL (ref 4.0–10.5)

## 2014-01-07 LAB — URINALYSIS, ROUTINE W REFLEX MICROSCOPIC
Bilirubin Urine: NEGATIVE
GLUCOSE, UA: NEGATIVE mg/dL
Hgb urine dipstick: NEGATIVE
KETONES UR: NEGATIVE mg/dL
LEUKOCYTES UA: NEGATIVE
NITRITE: NEGATIVE
PROTEIN: NEGATIVE mg/dL
Specific Gravity, Urine: 1.005 (ref 1.005–1.030)
UROBILINOGEN UA: 0.2 mg/dL (ref 0.0–1.0)
pH: 6 (ref 5.0–8.0)

## 2014-01-07 LAB — BASIC METABOLIC PANEL
BUN: 12 mg/dL (ref 6–23)
CALCIUM: 8.8 mg/dL (ref 8.4–10.5)
CO2: 26 mEq/L (ref 19–32)
Chloride: 105 mEq/L (ref 96–112)
Creatinine, Ser: 1 mg/dL (ref 0.50–1.35)
GFR calc Af Amer: >90 mL/min (ref >90)
GFR calc non Af Amer: 89 mL/min — ABNORMAL LOW (ref >90)
GLUCOSE: 95 mg/dL (ref 70–99)
Potassium: 4.2 mEq/L (ref 3.7–5.3)
Sodium: 141 mEq/L (ref 137–147)

## 2014-01-07 LAB — IRON AND TIBC
Iron: 74 ug/dL (ref 42–135)
Saturation Ratios: 23 % (ref 20–55)
TIBC: 327 ug/dL (ref 215–435)
UIBC: 253 ug/dL (ref 125–400)

## 2014-01-07 LAB — VITAMIN B12: Vitamin B-12: 496 pg/mL (ref 211–911)

## 2014-01-07 LAB — TRANSFERRIN: Transferrin: 245 mg/dL (ref 200–360)

## 2014-01-07 LAB — TSH: TSH: 40.54 u[IU]/mL — AB (ref 0.350–4.500)

## 2014-01-07 LAB — FERRITIN: FERRITIN: 414 ng/mL — AB (ref 22–322)

## 2014-01-07 MED ORDER — PANTOPRAZOLE SODIUM 40 MG PO TBEC
40.0000 mg | DELAYED_RELEASE_TABLET | Freq: Every day | ORAL | Status: DC
  Administered 2014-01-09 – 2014-01-27 (×18): 40 mg via ORAL
  Filled 2014-01-07 (×21): qty 1

## 2014-01-07 MED ORDER — ADULT MULTIVITAMIN W/MINERALS CH
1.0000 | ORAL_TABLET | Freq: Every day | ORAL | Status: DC
  Administered 2014-01-09 – 2014-01-22 (×12): 1 via ORAL
  Filled 2014-01-07 (×17): qty 1

## 2014-01-07 MED ORDER — METHOCARBAMOL 500 MG PO TABS
500.0000 mg | ORAL_TABLET | Freq: Three times a day (TID) | ORAL | Status: DC
  Administered 2014-01-07 – 2014-01-27 (×45): 500 mg via ORAL
  Filled 2014-01-07 (×74): qty 1

## 2014-01-07 MED ORDER — POLYETHYLENE GLYCOL 3350 17 G PO PACK
17.0000 g | PACK | Freq: Every day | ORAL | Status: DC | PRN
  Administered 2014-01-11: 17 g via ORAL
  Filled 2014-01-07: qty 1

## 2014-01-07 MED ORDER — HYDROMORPHONE HCL PF 1 MG/ML IJ SOLN
1.0000 mg | Freq: Once | INTRAMUSCULAR | Status: AC
  Administered 2014-01-07: 1 mg via INTRAVENOUS
  Filled 2014-01-07: qty 1

## 2014-01-07 MED ORDER — HYDROMORPHONE HCL 4 MG PO TABS
8.0000 mg | ORAL_TABLET | ORAL | Status: DC | PRN
  Administered 2014-01-07 – 2014-01-08 (×5): 8 mg via ORAL
  Filled 2014-01-07 (×6): qty 2

## 2014-01-07 MED ORDER — ONDANSETRON HCL 4 MG PO TABS: 4.0000 mg | ORAL_TABLET | Freq: Four times a day (QID) | ORAL | Status: DC | PRN

## 2014-01-07 MED ORDER — LORAZEPAM 1 MG PO TABS
1.0000 mg | ORAL_TABLET | ORAL | Status: DC | PRN
  Administered 2014-01-09 – 2014-01-20 (×11): 1 mg via ORAL
  Filled 2014-01-07 (×10): qty 1
  Filled 2014-01-07: qty 2
  Filled 2014-01-07 (×2): qty 1

## 2014-01-07 MED ORDER — DOCUSATE SODIUM 100 MG PO CAPS
100.0000 mg | ORAL_CAPSULE | Freq: Two times a day (BID) | ORAL | Status: DC
  Administered 2014-01-07 – 2014-01-27 (×36): 100 mg via ORAL
  Filled 2014-01-07 (×42): qty 1

## 2014-01-07 MED ORDER — DEXAMETHASONE 4 MG PO TABS
4.0000 mg | ORAL_TABLET | Freq: Three times a day (TID) | ORAL | Status: DC
  Administered 2014-01-07 – 2014-01-27 (×53): 4 mg via ORAL
  Filled 2014-01-07 (×63): qty 1

## 2014-01-07 MED ORDER — ENOXAPARIN SODIUM 40 MG/0.4ML ~~LOC~~ SOLN
40.0000 mg | SUBCUTANEOUS | Status: DC
  Administered 2014-01-09 – 2014-01-13 (×4): 40 mg via SUBCUTANEOUS
  Filled 2014-01-07 (×21): qty 0.4

## 2014-01-07 MED ORDER — LORAZEPAM 1 MG PO TABS
1.0000 mg | ORAL_TABLET | Freq: Two times a day (BID) | ORAL | Status: DC
  Administered 2014-01-08 – 2014-01-22 (×26): 1 mg via ORAL
  Filled 2014-01-07: qty 1
  Filled 2014-01-07: qty 2
  Filled 2014-01-07 (×21): qty 1
  Filled 2014-01-07: qty 2
  Filled 2014-01-07 (×6): qty 1

## 2014-01-07 MED ORDER — BISACODYL 10 MG RE SUPP: 10.0000 mg | Freq: Every day | RECTAL | Status: DC | PRN

## 2014-01-07 MED ORDER — MORPHINE SULFATE ER 30 MG PO TBCR
30.0000 mg | EXTENDED_RELEASE_TABLET | Freq: Two times a day (BID) | ORAL | Status: DC
  Administered 2014-01-07: 30 mg via ORAL
  Filled 2014-01-07 (×5): qty 1

## 2014-01-07 MED ORDER — SENNA 8.6 MG PO TABS
2.0000 | ORAL_TABLET | Freq: Every day | ORAL | Status: DC
  Administered 2014-01-10 – 2014-01-26 (×16): 17.2 mg via ORAL
  Filled 2014-01-07 (×18): qty 2

## 2014-01-07 MED ORDER — HYDROMORPHONE HCL PF 1 MG/ML IJ SOLN
1.0000 mg | INTRAMUSCULAR | Status: DC | PRN
  Administered 2014-01-07 (×4): 1 mg via INTRAVENOUS
  Filled 2014-01-07 (×4): qty 1

## 2014-01-07 MED ORDER — MELOXICAM 15 MG PO TABS
15.0000 mg | ORAL_TABLET | Freq: Every day | ORAL | Status: DC
  Administered 2014-01-07: 15 mg via ORAL
  Filled 2014-01-07 (×3): qty 1

## 2014-01-07 MED ORDER — HYDROMORPHONE HCL PF 1 MG/ML IJ SOLN
1.0000 mg | INTRAMUSCULAR | Status: DC | PRN
  Administered 2014-01-07 – 2014-01-08 (×8): 1 mg via INTRAVENOUS
  Filled 2014-01-07 (×9): qty 1

## 2014-01-07 MED ORDER — LEVOTHYROXINE SODIUM 100 MCG PO TABS
100.0000 ug | ORAL_TABLET | Freq: Every day | ORAL | Status: DC
  Filled 2014-01-07 (×3): qty 1

## 2014-01-07 MED ORDER — ONDANSETRON HCL 4 MG/2ML IJ SOLN: 4.0000 mg | Freq: Four times a day (QID) | INTRAMUSCULAR | Status: DC | PRN

## 2014-01-07 NOTE — Progress Notes (Signed)
*  PRELIMINARY RESULTS* Echocardiogram 2D Echocardiogram has been performed.  Frank Ward 01/07/2014, 12:00 PM

## 2014-01-07 NOTE — Telephone Encounter (Signed)
Error, Dr.Murray aware of inpt status will see patient today

## 2014-01-07 NOTE — ED Notes (Signed)
Patient is alert and oriented x3.  He is complaining of leg swelling that he was seen at [place] week for the same complaint.  Patient currently rates his pain 10 of 10.

## 2014-01-07 NOTE — ED Notes (Signed)
MD at bedside. ADMITTING MD PRESENT TO EVALUATE THIS PT BEFORE TRANSFER UPSTAIRS

## 2014-01-07 NOTE — Progress Notes (Signed)
Brief visit w/pt who remembered me from his last hospitalization. Pt said he was not really feeling better today. Pt was appreciative for my stopping by to check on him. Ernest Haber Chaplain  01/07/14 1300  Clinical Encounter Type  Visited With Patient

## 2014-01-07 NOTE — Progress Notes (Signed)
VASCULAR LAB PRELIMINARY  PRELIMINARY  PRELIMINARY  PRELIMINARY  Bilateral lower extremity venous duplex completed.    Preliminary report:  Bilateral:  No evidence of DVT, superficial thrombosis, or Baker's Cyst.   Vilma Prader [place], RVS 01/07/2014, 8:54 AM

## 2014-01-07 NOTE — ED Provider Notes (Signed)
CSN: [id number]     Arrival date & time 01/07/14  0306 History   First MD Initiated Contact with Patient 01/07/14 0325     Chief Complaint  Patient presents with  . Leg Swelling     (Consider location/radiation/quality/duration/timing/severity/associated sxs/prior Treatment) HPI Comments: 43 y/o advanced cancer comes in with cc of right leg swelling and severe pain. This is patient's 4th visit in the last week. Pt reports that he has severe headache, neck pain and bilateral leg pain. In addition, he has new right leg swelling. Pt reports that he has no pain meds. He had to take a couple of dilaudid orally at a time, and that wasn't as helpful. He has no primary care doctor, or cancer doctor. No associated numbness, weakness, urinary incontinence, urinary retention, bowel incontinence, saddle anesthesia.   The history is provided by the patient and medical records.    Past Medical History  Diagnosis Date  . Cancer     Thyroid  . Thyroid disease   . Hypertension   . Pneumonia    Past Surgical History  Procedure Laterality Date  . Tendon repair    . Thyroidectomy     Family History  Problem Relation Age of Onset  . Diabetes Mother   . Diabetes Brother    History  Substance Use Topics  . Smoking status: Former Smoker -- 10 years    Types: Cigarettes    Quit date: 08/29/2011  . Smokeless tobacco: Never Used  . Alcohol Use: 4.8 oz/week    8 Cans of beer per week     Comment: occ    Review of Systems  Constitutional: Positive for activity change.  Cardiovascular: Negative for chest pain.  Gastrointestinal: Negative for abdominal pain.  Musculoskeletal: Negative for arthralgias and back pain.  Skin: Negative for rash.  Neurological: Positive for headaches.      Allergies  No known allergies  Home Medications   Prior to Admission medications   Medication Sig Start Date End Date Taking? Authorizing Provider  dexamethasone (DECADRON) 4 MG tablet Take 1 tablet (4 mg  total) by mouth 3 (three) times daily. 01/01/14   Robbie Lis, MD  docusate sodium 100 MG CAPS Take 100 mg by mouth 2 (two) times daily. 01/01/14   Robbie Lis, MD  HYDROmorphone (DILAUDID) 4 MG tablet Take 1 tablet (4 mg total) by mouth every 4 (four) hours as needed for severe pain. 01/02/14   Ephraim Hamburger, MD  ibuprofen (ADVIL,MOTRIN) 200 MG tablet Take 200-400 mg by mouth every 6 (six) hours as needed for mild pain.    Historical Provider, MD  levothyroxine (SYNTHROID, LEVOTHROID) 100 MCG tablet Take 100 mcg by mouth daily before breakfast.     Historical Provider, MD  Multiple Vitamin (MULTIVITAMIN WITH MINERALS) TABS tablet Take 1 tablet by mouth daily.    Historical Provider, MD  OVER THE COUNTER MEDICATION Take 1 tablet by mouth daily. Green tea    Historical Provider, MD  OVER THE COUNTER MEDICATION Take 1 tablet by mouth at bedtime. OTC arthritis supplement    Historical Provider, MD  OVER THE COUNTER MEDICATION Take 1 tablet by mouth 3 (three) times daily. OTC supplement:Grandma's herb    Historical Provider, MD  pantoprazole (PROTONIX) 40 MG tablet Take 1 tablet (40 mg total) by mouth daily. 01/01/14   Robbie Lis, MD  traMADol (ULTRAM) 50 MG tablet Take 50 mg by mouth at bedtime as needed for moderate pain.  Historical Provider, MD   BP 150/100  Pulse 98  Temp(Src) 98.7 F (37.1 C) (Oral)  Resp 18  SpO2 96% Physical Exam  Nursing note and vitals reviewed. Constitutional: He is oriented to person, place, and time. He appears well-developed.  HENT:  Head: Normocephalic and atraumatic.  Eyes: Conjunctivae and EOM are normal. Pupils are equal, round, and reactive to light.  Neck: Normal range of motion. Neck supple.  Cardiovascular: Normal rate and regular rhythm.   Pulmonary/Chest: Effort normal and breath sounds normal.  Abdominal: Soft. Bowel sounds are normal. He exhibits no distension. There is no tenderness. There is no rebound and no guarding.  Neurological: He is  alert and oriented to person, place, and time.   No step offs, no erythema. Pt has 2+ patellar reflex bilaterally. Able to discriminate between sharp and dull. Able to ambulate   Skin: Skin is warm.    ED Course  Procedures (including critical care time) Labs Review Labs Reviewed  CBC WITH DIFFERENTIAL - Abnormal; Notable for the following:    RBC 3.49 (*)    Hemoglobin 10.9 (*)    HCT 33.9 (*)    RDW 15.9 (*)    Lymphocytes Relative 11 (*)    Lymphs Abs 0.5 (*)    All other components within normal limits  BASIC METABOLIC PANEL - Abnormal; Notable for the following:    GFR calc non Af Amer 89 (*)    All other components within normal limits  APTT  PROTIME-INR    Imaging Review No results found.   EKG Interpretation None      MDM   Final diagnoses:  Cancer associated pain    Pt comes in with severe pain. Has advanced cancer hx. Pt's last note indicates Rad on and Nsurgery signing off care. It seems like palliative care saw him, and recommended dilaudid.  I am unsure what patient was discharged home with. He states that he had PCP discharge, and oxycodone. It appears that he has had return ED visits since that discharge, and Dr. Regenia Skeeter d/c him with oral dilaudid, which he has run out off.  I explained to the patient that it appears, based on the notes, that there are very limited, if any treatment options right now. Pt seemingly wants more chemo and radiation.  He has RLE swelling, US DVT has been ordered.  We will admit patient - for pain control. Med Onc can be consulted (as per the last rad onc note, that might not be a bad idea), to see if they have any options for the patient. Patient needs to be set up with optimal pain management as well.  Admit for pain control, and optimal plan on outpatient pain management, palliative options with oncology and r/o DVT.  Varney Biles, MD 01/07/14 [phone]

## 2014-01-07 NOTE — ED Notes (Signed)
VENOUS DUPLEX TO PERFORM PROCEDURE IN ADMITTING ROOM

## 2014-01-07 NOTE — ED Notes (Signed)
Report given by Topher L RN

## 2014-01-07 NOTE — Progress Notes (Signed)
CC: Dr. [name]  Followup note:  Diagnosis: Metastatic papillary thyroid cancer  Previous radiation therapy: History of palliative radiotherapy to the thoracic spine "centered at the T9 vertebral body", with a dose of 3000 cGy/10 delivered at the [facility]. History of palliative external beam radiation therapy to the whole brain/upper mid cervical spine, 3000 cGy in 10 sessions (09/04/2013 through 09/24/2013) also status post abbreviated palliative radiotherapy to the lower cervical/upper thoracic spine (C7-T3) receiving 2100 cGy in 7 sessions and also abbreviated course of palliative radiation therapy to the right scapula with the same fractionation. He did not receive his final 3 fractions of planned radiation therapy to his right scapula or lower cervical/upper thoracic spine (C7-T3).  History: The patient is a 43 year old male who is seen today for inpatient followup of his metastatic papillary thyroid carcinoma with consideration of further palliative radiation therapy to help with his pain control. He is known widespread metastatic disease to bone and also pulmonary metastases. He was last seen by Dr. [name] on 01/01/2014 at which time Dr. [name] did not feel that he was a candidate for further palliative radiation therapy. He has also been presented at the brain and spine conference and neurosurgery did not recommend further consideration of surgery. It was suggested that he be considered for perhaps some type of systemic therapy. The patient was seen earlier today by medical oncology. To review, he had a MR I scan of the cervical spine on 12/26/2013 which showed progressive collapse of pathologic fractures at C3 and to a lesser extent at C4. There is progressive right foraminal and central canal stenosis at C2-C3. The central canal is narrowed to 0.9 cm at the level of C3 with mass effect on the spinal cord and mild cord edema. There is progressive tumor encroachment into the spinal  canal at C3 level. Multiple pulmonary nodules have increased in size. MRI of the thoracic and lumbar spine on 12/29/2013 showed T1 N2 non-metastases with a pathologic fracture of infected the T9 neural foramina but no spinal cord compression. There was also bulky sacral tumor with extraosseous extension including impingement of the exiting left S1 nerve. The patient has been on dexamethasone over the past few days has had worsening pain, primarily along his right neck with increased tingling along his right hand, right hip and right lower extremity. He describes pain along his right neck, lower thoracic spine, and sacrum bilaterally, left greater than right.  Physical examination: Alert and oriented but somewhat agitated. Wt Readings from Last 3 Encounters:  01/07/14 189 lb 13.1 oz (86.1 kg)  12/30/13 185 lb 9.6 oz (84.188 kg)  12/09/13 184 lb (83.462 kg)   Temp Readings from Last 3 Encounters:  01/07/14 97.6 F (36.4 C) Oral  01/01/14 98.7 F (37.1 C) Oral  01/01/14 98.1 F (36.7 C) Oral   BP Readings from Last 3 Encounters:  01/07/14 137/85  01/02/14 137/89  01/01/14 124/95   Pulse Readings from Last 3 Encounters:  01/07/14 92  01/02/14 78  01/01/14 122   Head and neck examination: He wears a cervical collar and this is not removed. Range of motion of the cervical spine is not tested, but during conversation he is able to rotate his neck approximately 45 left and right. Back: There is pain described at approximately T9-T10 and also along his SI joints bilaterally, left greater than right. There is no palpable spinal discomfort along his thoracic, lumbar, and sacral spine. Neurologic examination: Upper extremity strength is good. There is  slight weakness of right hip flexion and there is subjective decreased sensation along both hands, right greater than left.  Impression: Metastatic carcinoma of thyroid with segment involvement from disease along his cervical spine, lower thoracic  spine, and sacrum. His major problem is disease along his C3 vertebra with mass effect on the spinal cord with early spinal cord compression. During my discussion with the patient, he seriously doubts that he received radiation therapy as discussed above by Dr. [name]. I reviewed his records, and the patient is mistaken. I think the only possible treatment for his cervical spine disease would be possible radiosurgery. I do not perform radiosurgery, and I would like to review with Dr. [name] and my radiosurgery colleagues to see if he would be a potential candidate. I told the patient that I will get back with him tomorrow.  30 minutes was spent face-to-face with the patient primarily counseling the patient, reviewing his records and coordinating possible care.

## 2014-01-07 NOTE — Consult Note (Signed)
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 Juliann Mule (please refer to oncological history below). Of note, patient has been seen for medical opinion at [facility] and [facility] as well. At the time of the initial consultation with Dr.Chism,he had previously received radiation to the T9 vertebral body in [place]. Plans for  chemotherapy and more radiation were made in January, which patient eventually failed to follow up. He was to complete palliative radiation for intracranial metastatic disease and the upper cervical spine and possibly to the T1 vertebral body.  He states that he stopped  "5 treatments away due to Pneumonia". He was not seen in office since.  After repeated visits to the Emergency Room with poor pain control and despite reporting compliance to steroids and pain management, Mr. Gratz presented to the [facility] Emergency Department on 5/25  with increasing right neck pain, and numbness and tingling in all digits. He also experienced subjective right lower extremity weakness due to pain. In addition, he noticed a 4 day history of bilateral lower extremity swelling (with a negative doppler). He denied worsening chronic constipation or urinary incontinence or retention. Labs showed mild anemia (Hb 10.9) and normal chemistries. TSH on January of 2015 was 14, new TSH pending . He had no other medical complaints such as shortness sof breath, chest pain, headaches, double vision or seizures. We have been requested to see the patient in consultation with further recommendations on his  care.  Oncological History:  Suren Payne is a 43 y.o. male with a history of metastatic thyroid cancer diagnosed in July 2014 with findings on paraspinal biopsy. His medical oncology care has been rendered by Dr. Delight Hoh of [facility]. Dr. Grayland Ormond stated that patient was lost to followup . In review, he had  presented to Dr. Grayland Ormond for evaluation of thyroid mass, multiple lung and bone lesions suspicious for metastatic disease on Jan 02, 2013. This was a follow up appointment for an emergency room visit for acute onset backpain several weeks prior. During this time the patient was homeless. CT of Head on December 10, 2012 demonstrated no acute intracranial processes but CT Chest, Abdomen and Pelvis on the same date showed innumerable bilateral pulmonary nodules of varying sizes with an associated 3 x 3.5 cm soft tissue mass with associated bone destruction of the right ilium and a heterogeneous enlargement of the right thyroid gland with right inferior extension and leftward deviation of the trachea concerning malignancy. He had a PET scan (01/03/13) showed widespread foci of abnormal uptake consistent with metastatic disease and a dominant mass in the right thyroid lobe suspicious for thyroid malignancy with multiple bony metastases and a large T9 destructive lesion likely invading the spinal canal with significant spinal lesions at C3 and C4 and T1. The lesions are T12 and L1 were lytic and hypermetabolic. There were also lesions in the right sacral and adjacent iliac bone and more anterior in the right iliac bone. 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 He had a  total thyroidectomy on 01/29/2013 by Dr. Roena Malady.  CT scan of the head (02/05/2013) showed 2 lesions--an abnormal hyperdense focus in the posterior aspect of there right lateral  ventricle measuring 10 x 12 mm and a 5 mm diameter hyperdense nodule in the fourth ventricle-- in the ventricle occupying but patient was asymptomatic. He underwent radiation to his T-9 spine based on significant pain and marked bony destruction with possible cord impingement. He had a total of 300 cGY/Fx in 10 fractions from 01/30/2013 to 02/12/2013 by Dr. Noreene Filbert. [facility] IV narcotic regimen. He followed up with his medical oncologists in July 20 with his TSH greater than 35, ready to proceed with radioactive iodide ablation. Because of his social situation, he was required to be admitted to the hospital for treatment. His plan for treatment was radioactive iodide and if that did not work to consider Sutent or other oral chemotherapies. He was scheduled for neurosurgery evaluation on July 29th, 2014. Treatment for his pain was managed by Dr. Izora Gala Phifer of Palliative care. He underwent radioactive iodide ablation on 05/02/2013 receiving a dose of 1.76 mCi of iodine 131. However given the bulky widespread metastatic disease which was I-131 avid and the large metastatic tumor size in multiple foci the likelihood of these being completely treated with I-131 was felt to be low and close follow-up was recommended. He was subsequently lost to follow-up.  Notable labs in May, 2014 included CA 19-9 of 43 (0-35 U/mL) and CEA of 1.4 (0.0 - 4.7 ng/mL), PSA of 1.7, AFP of 3.7, serum calcitonin less than 2.0 pg/mL, THY antithyroglobulin AB less than 1.0 IU/mL and a negative beta-HCG tumor marker. TSH on 03/01/2013 was 42.3 and repeated on 03/25/2013 and found to be 69.8.   In January of 2015 he presented to [facility] ED with right neck and shoulder pain. A CT of his neck and chest showed pathological fractures of C3 and C4 vertebrae with metastases to the cervical spine. He was evaluated by neurosurgery and recommended for an MRI of the spine but  patient signed out AMA from the ED. He presented to [facility] ED on 1/14 due to right neck pain radiating down the shoulder and dizziness. MRI of the brain and C-spine showed multiple metastases to the brain, progressive pathological compression fracture of C3 and C4 with extensive extraosseous tumor spread.  He was seen in consultation by Dr. Juliann Mule at [facility] with recommendations.  He has have progressive collapse of pathologic fractures at C3 and to a lesser extent C4. Progressive right foraminal and central canal stenosis at C2-3. Narrowing of the central canal to 9 mm at the level of C3 with mass effect on the spinal cord and mild cord edema. Progressive tumor encroachment into the spinal canal at the C3 level. MRI from last week demonstrated reactive marrow changes versus early osseous tumor and T3 spruced T6 and T11 vertebrae. There is bulky tumor with epidural and extraosseous extension at T12 in the right L1 neural foramina. He also had bulky sacral tumor with extraosseous extension including impingement of the exiting left S1 nerve root. He has extensive pulmonary metastatic disease.   He was to be seen at our office and at the Radiation Oncology clinic by Dr. [name] in February of 2015 to receive radiation treatments, but he failed to keep up his appointments.     Past Medical History  Diagnosis Date  . Cancer  Thyroid  . Thyroid disease   . Hypertension   . Pneumonia      MEDICATIONS:  Scheduled Meds: . dexamethasone  4 mg Oral TID  . docusate sodium  100 mg Oral BID  . enoxaparin (LOVENOX) injection  40 mg Subcutaneous Q24H  . levothyroxine  100 mcg Oral QAC breakfast  . LORazepam  1 mg Oral BID  . meloxicam  15 mg Oral Daily  . methocarbamol  500 mg Oral TID  . morphine  30 mg Oral Q12H  . multivitamin with minerals  1 tablet Oral Daily  . pantoprazole  40 mg Oral Daily  . senna  2 tablet Oral QHS   Continuous Infusions:  PRN Meds:.bisacodyl,  HYDROmorphone (DILAUDID) injection, HYDROmorphone, LORazepam, ondansetron (ZOFRAN) IV, ondansetron, polyethylene glycol  ALLERGIES:  Allergies  Allergen Reactions  . No Known Allergies     Family History  Problem Relation Age of Onset  . Diabetes Mother   . Diabetes Brother   No Cancer in the family.   Past Surgical History  Procedure Laterality Date  . Tendon repair    . Thyroidectomy     Social History:  Patient is single, no children. He lives in [place] with his mother and sometimes with friends. He moved from [place] 6 months ago. He quit smoking in January of 2014, until then he smoked 1 ppd for 10 years.He reports that he drinks about 8 beers a week.  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  Cardiovascular: occasional palpitations, but denies chest pain. He reports positive lower extremity edema on admission.  PULM: Denies shortness of breath, wheezing or cough.  GI: Denies nausea, vomiting, worsening constipation, diarrhea or blood in the stools.  GU: Denies dysuria, frequency, incontinence, retention  or urgency . No hematuria LYMPH: Denies lymphadenopathy.  Musculoskeletal: chronic progressive arthralgias, myalgias as above. He ambulates with a cane. Skin: Denies skin rash or ulcers.  NEURO: per history of present illness  PSYCH: Denies depression. He admits to irritability.   PHYSICAL EXAMINATION:   Filed Vitals:   01/07/14 0856  BP: 147/98  Pulse: 87  Temp: 97.4 F (36.3 C)  Resp: 18   Filed Weights   01/07/14 0856  Weight: 189 lb 13.1 oz (86.20 kg)    43 year old [place] American male in no acute distress,conversant, alert and oriented to time, place and date.   General well-developed and well-nourished  HEENT: Normocephalic, atraumatic.Sclera anicteric.Oral cavity without thrush or lesions. Neck: unable to perform, or appreciate thyromegaly or lymphadenopathy due to a cervical collar.  Lungs: Clear to auscultation. No wheezing, rhonchi or rales. Cardiac: Regular rate and rhythm, no murmur,rubs or gallops. Bilateral 1 + edema. Abdomen: Soft nontender,bowel sounds x4. Nohepatosplenomegaly Extremities: No clubbing cyanosis .No petechial rash Skin: no lesions noted. Striae in both deltoid region noted. Tattoos present. Lymphs:No lymphadenopathy noted. Neuro: slight right lower extremity decreased strength, 4/5 due to pain. Decreased sensation in all digits.No dysarthria. No confusion.  LABORATORY/RADIOLOGY DATA:   Recent Labs Lab 01/07/14 0420  WBC 4.5  HGB 10.9*  HCT 33.9*  PLT 282  MCV 97.1  MCH 31.2  MCHC 32.2  RDW 15.9*  LYMPHSABS 0.5*  MONOABS 0.6  EOSABS 0.2  BASOSABS 0.0    CMP  Recent Labs Lab 01/07/14 0420  NA 141  K 4.2  CL 105  CO2 26  GLUCOSE 95  BUN 12  CREATININE 1.00  CALCIUM 8.8        Component Value Date/Time   BILITOT <0.2* 12/30/2013 0355    Anemia panel:   No results found for this basename: VITAMINB12, FOLATE, FERRITIN, TIBC, IRON, RETICCTPCT,  in the last 72 hours  No results found for this basename: TSH, T4TOTAL, FREET3, T3FREE, THYROIDAB,  in the last 72 hours   No results found for this basename: esrsedrate     Recent Labs Lab 01/07/14 0420  INR 0.97      Urinalysis    Component Value Date/Time   COLORURINE ORANGE* 10/16/2013 1344   APPEARANCEUR CLEAR 10/16/2013 1344   LABSPEC 1.024 10/16/2013 1344   PHURINE 5.0 10/16/2013 1344   GLUCOSEU NEGATIVE 10/16/2013 1344   HGBUR NEGATIVE 10/16/2013 1344   BILIRUBINUR SMALL* 10/16/2013 1344   KETONESUR 15* 10/16/2013 1344   PROTEINUR NEGATIVE 10/16/2013 1344   UROBILINOGEN 1.0 10/16/2013 1344   NITRITE POSITIVE* 10/16/2013 1344   LEUKOCYTESUR  SMALL* 10/16/2013 1344     Thyroid function studies No results found for this basename: TSH, T4TOTAL, FREET3, T3FREE, THYROIDAB,  in the last 72 hours  Radiology Studies:    Dg Hip Complete Right  12/29/2013   CLINICAL DATA:  hx metastatic cancer, r/o pathologic fx  EXAM: RIGHT HIP - COMPLETE 2+ VIEW  COMPARISON:  None.  FINDINGS: There is no evidence of hip fracture or dislocation. Areas of peripheral hypertrophic spurring involving the acetabulum and lower border of the femoral head. Soft tissues unremarkable.  IMPRESSION: No acute osseous abnormalities.  Mild osteoarthritic changes.   Electronically Signed   By: Margaree Mackintosh M.D.   On: 12/29/2013 23:22   Mr Jeri Cos YW Contrast  12/26/2013   COMPARISON:  Brain MRI of 08/28/2013  FINDINGS: Osseous metastatic disease in the cervical spine is more fully evaluated on separate cervical spine MRI.  There is no acute infarct or intracranial hemorrhage. Enhancing mass in the fourth ventricle has decreased in size, measuring 18 x 15 x 13 mm (series 10, image 37, previously 23 x 18 x 17 mm). 4 mm enhancing lesion in the right central sulcus is unchanged (series 10, image 114). 8 x 8 mm enhancing lesion in the atrium of the right lateral ventricle is unchanged (series 10, image 84). No new brain lesions are identified. Skull lesions noted on the prior study demonstrate increased precontrast T1 signal and no definite enhancement and are not definitely indicative of osseous metastatic disease.  There is mild T2 hyperintensity in the periventricular white matter and inferior pons/medulla, nonspecific but may be at least partly reflective of post treatment changes. There is slightly greater ventricular enlargement than on the prior study without frank hydrocephalus seen. There is no midline shift or extra-axial fluid collection.  Right frontal sinus fluid/mucosal thickening is noted. Mucous retention cysts are again seen in the maxillary sinuses. There are small  bilateral mastoid effusions.  IMPRESSION: 1. Decreased size of lesion in the fourth ventricle. Unchanged size of lesions in the right central sulcus and right lateral ventricle. 2. No evidence of new intracranial metastases. 3. Slightly increased ventricular dilatation without definite evidence of hydrocephalus.   Electronically Signed   By: Logan Bores   On: 12/26/2013 15:50   Mr Cervical Spine W Wo Contrast  12/26/2013   COMPARISON:  MRI of the cervical spine 08/28/2013  FINDINGS: Mild  diffuse cord signal abnormality is now evident at the C3 level. The craniocervical junction is within normal limits.  C2-3: A rightward disc osteophyte complex is present. There is tumor infiltration into the right neural foramen and developing the right pedicle and posterior lateral mass of C3. There is progressive encroachment on the canal at the C3 level with further retropulsion of bone. The canal is now narrowed to 9 mm. Mild left facet hypertrophy is evident.  C3-4: Retropulsed bone and tumor effaces the right-sided PICC now with progression since prior exam. Progressive right foraminal stenosis is evident. The left foramen is patent.  C4-5: Mild right-sided uncovertebral disease is evident. Mild foraminal narrowing is present bilaterally without change.  C5-6: Left-sided uncovertebral spurring is present with mild left foraminal stenosis. The central canal and right foramen are patent.  C6-7:  Negative.  C7-T1:  Negative.  T1-2: An inferior vertebral body metastasis is present without posterior extension. There is no significant extraosseous tumor or associated stenosis.  Multiple left-sided lung nodules are present in the upper lobe, measuring up to 16 mm these were not previously imaged. There is some progression since the CT scan December 2014.  IMPRESSION: 1. Progressive collapse of pathologic fractures at C3 and to a lesser extent C4. 2. Progressive right foraminal and central canal stenosis at C2-3. 3. Narrowing of  the central canal to 9 mm at the level of C3 with mass effect on the spinal cord and mild cord edema. 4. Progressive tumor encroachment into the spinal canal at the C3 level. 5. Progressive right foraminal stenosis at C3-4. 6. Mild spondylosis at C4-5 and C5-6 is stable. 7. Metastasis along the inferior aspect of the T1 vertebral body is noted without posterior extension. 8. Multiple pulmonary nodules have increased in size since the prior studies. The largest lesion is a medial upper lobe lesion on the left measuring 16 mm. Critical Value/emergent results were called by telephone at the time of interpretation on 12/26/2013 at 4:18 PM to Dr. [name] , who verbally acknowledged these results. And   Electronically Signed   By: Lawrence Santiago M.D.   On: 12/26/2013 16:18   Mr Thoracic Spine W Wo Contrast  12/30/2013    FINDINGS: MR THORACIC SPINE FINDINGS  See recent comparison for cervical spine findings.  Increased T1 signal in some thoracic vertebral bodies suggesting previous widespread spinal radiation.  Decreased T1 signal metastases in the T1 vertebral body (series 9, image 5).  Mottle decreased T1 signal throughout the T3, T4, T5, much of the T6, and T11 vertebral bodies could be red marrow reactivation or early metastatic disease.  Near complete replacement of the T9 vertebral body and left pedicle by tumor. Mild collapse of that level. Heterogeneous postcontrast enhancement associated. Despite these findings, no epidural tumor or spinal stenosis at this level. There is left greater than right T9 foraminal stenosis related to the collapse and left pedicle expansion.  There is a partially exophytic tumor arising from the left T12 vertebral body anteriorly and laterally (series 9, image 49). Associated epidural tumor in the left T12 neural foramen resulting in foraminal stenosis (series 6, image 12). Bulky posterior element tumor at this level, contiguous with the upper lumbar tumor described below.   Superimposed small degenerative disc herniation at T7-T8. No thoracic spinal stenosis. Spinal cord signal is within normal limits at all visualized levels. No abnormal thoracic intradural enhancement.  Widespread signal abnormality in the lungs compatible with widespread pulmonary metastases.   IMPRESSION: 1. Suspect previous  thoracic/thoracic spine radiation. Superimposed T1 and T9 metastases, the latter with a pathologic compression fracture affecting the at T9 neural foramina, but no spinal cord compression. 2. Reactive marrow changes versus early osseous tumor in the T3-T6 and T11 vertebrae. 3. Bulky thoracolumbar junction osseous tumor with epidural and extraosseous extension. No malignant lumbar spinal stenosis, but epidural tumor involves the bilateral T12 and right L1 neural foramina. 4. Bulky sacral tumor with extraosseous extension, including impingement of the exiting left S1 nerve. 5. Extensive pulmonary metastatic disease partially visible.   Electronically Signed   By: Lars Pinks M.D.   On: 12/30/2013 19:31   Mr Lumbar Spine W Wo Contrast  12/30/2013    COMPARISON:  Brain and cervical spine MRI 12/26/2013. CT Abdomen and Pelvis 09/05/2013.   FINDINGS  Bulky osseous and extraosseous tumor at the thoracolumbar junction encompassing up to 8 x 6 x 8 cm. Tumors tracking cephalad and caudal in the right deep paraspinal soft tissues. The right L1 posterior elements including the pedicle are obliterated. The T12 spinous process is largely obliterated. There is some epidural tumor in both T12 neural foramina, worse on the left). There is early epidural tumor in the right L1 neural foramina. Early right posterior lateral epidural tumor, but no narrowing of the spinal canal at this time.  Early involvement of the right L2 superior articulating facet by the bulky right spinal and paraspinal tumor described at L1. Otherwise the L2 and L3 levels are spared.  Bulky sacral tumor, replacing the right S1 and S2 a ala  and obliterating the right SI joint and some of the medial right iliac bone. Tumor then tracks anteriorly in the right iliacus muscle (series 14, image 35). The S1 level is expanded, and epidural tumor impinges the left S1 nerve root (series 15, image 37). The sacral tumor on the right involves some of the right L5 inferior articulating facet, and partially obliterates that facet joint. There are otherwise small focal tumors in the inferior L4 and posterior superior L5 vertebral bodies.  Despite the above findings, Visualized lower thoracic spinal cord is normal with conus medularis at L1. No lumbar spinal stenosis. No thickening or abnormal enhancement of the cauda equina.  Stable visualized abdominal viscera.  IMPRESSION: 1. Suspect previous thoracic/thoracic spine radiation. Superimposed T1 and T9 metastases, the latter with a pathologic compression fracture affecting the at T9 neural foramina, but no spinal cord compression. 2. Reactive marrow changes versus early osseous tumor in the T3-T6 and T11 vertebrae. 3. Bulky thoracolumbar junction osseous tumor with epidural and extraosseous extension. No malignant lumbar spinal stenosis, but epidural tumor involves the bilateral T12 and right L1 neural foramina. 4. Bulky sacral tumor with extraosseous extension, including impingement of the exiting left S1 nerve. 5. Extensive pulmonary metastatic disease partially visible.   Electronically Signed   By: Lars Pinks M.D.   On: 12/30/2013 19:31     ASSESSMENT and PLAN : 43 yo with metastatic thyroid cancer, with an aggressive columnar variant, with mets to bone, lungs and brain admitted with dizziness, right shoulder pain. He was lost to follow up from prior oncology care and  palliative pain management.    1. Metastatic thyroid cancer complicated by  A. Brain mets, progressive.  --  Agree with reconsult with radiation oncology for consideration of EBRT as the patient is symptomatic. Not  a surgery candidate.    -- Continue decadron per primary team.  --Awaiting TSH, Free T4.  B. C3, C4 Lesion  --Might also  consider focal radiation. Prior radiation to T9 with success in improvement of symptoms.   C. Right neck pain secondary to B.  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 Continue lyrica. Palliative care consult had been obtained in the past, ma need to be reevaluated while in the hospital  2:  Full Code: Patient wants to achieve "cure". He does not appear to have clear understanding of his metastatic disease. He will need a follow up palliative care evaluation to further discuss comfort issues while palliative chemo/radiation plans may take place.  3. Pain Controlled with current regimen including MS Contin, Dilaudid and Robaxin. He is to continue on Dexamethasone.   4.Normocytic Anemia of chronic disease  Anemia panel pending. No bleeding issues at this time.  Other medical issues as per admitting team  Thank you for allowing [place] to participate on the care of Mr. Mcgriff. Will continue to follow with you.    **Disclaimer: This note was dictated with voice recognition software. Similar sounding words can inadvertently be transcribed and this note may contain transcription errors which may not have been corrected upon publication of note.Rondel Jumbo, PA-C 01/07/2014, 10:09 AM   ADDENDUM: I met with Mr Winer for approximately 1 hour today. The patient seems to me fully competent, but he.. is very concrete and circumstantial; it takes a considerable time for him to get to the point but ultimately he does get to the point which is that he was dfischarged prematurely in the past and that is why he could not complete his treatments. He wants to stay in the hospital until his treatments are completed. I have placed a SW consult so the patient can more fully discuss what his options are in terms of where he will be cared for in the immediate and longer term future.  The patient  understands his cancer is not curable at this point. However, it is treatable. FROM A SYSTEMIC POINT OF VIEW:  The initial plan is to proceed to I131 scanning to see if his cancer is still iodine avid, and if so we will ask nuclear medicine to proceed to ablative therapy. This would be followed by TSH suppression with high-dose synthroid. If I131 ablation is not an option, we will try to start the patient on a tyrosine kinase inhibitor--this is a daily tablet which can control disease progression for many months, with moderate toxicity.  FROM A LOCAL POINT OF VIEW: patient is being considered by radiation oncology for stereotactic radiation to his cervical lesion with cord involvement  The patient has very specific pain management requests. I have referred him to the hospitalist attending in that regard. The palliative team may also be able to assist.  The patient tells me he has advanced directives in place and that he has named his aunt Lucious Groves as Hazel. I have asked the palliative team to confirm and if not in fact done to help the patient complete the appropriate docunments.  I also asked mr Clinkscale whether he plans to stay in this area, in which case I will be glad to be his oncologist, or move back to [place], in which case I can refer him back to [facility], at his discretion.  Thank you for your help to this patient! Will follow with you.  I personally saw this patient and performed a substantive portion of this encounter with the listed APP documented above.   Chauncey Cruel, MD 01/08/2014 8:46 AM

## 2014-01-07 NOTE — H&P (Signed)
[facility] History and Physical  Frank Ward GNO:[id number] DOB: 1971/02/04 DOA: 01/07/2014  Referring physician:  Varney Biles PCP:  No PCP Per Patient   Chief Complaint:  Leg swelling and pain  HPI:  The patient is a 43 y.o. year-old male with history of metastatic thyroid cancer diagnosed July 2014 previously treated by Dr. [name] and Dr. Juliann Mule.  Despite treatment with synthroid and some XRT, he has had progressive collapse of pathologic fractures at C3 and to a lesser extent C4. Progressive right foraminal and central canal stenosis at C2-3. Narrowing of the central canal to 9 mm at the level of C3 with mass effect on the spinal cord and mild cord edema. Progressive tumor encroachment into the spinal canal at the C3 level.  MRI from last week demonstrated reactive marrow changes versus early osseous tumor and T3 spruced T6 and T11 vertebrae. There is bulky tumor with epidural and extraosseous extension at T12 in the right L1 neural foramina. He also had bulky sacral tumor with extraosseous extension including impingement of the exiting left S1 nerve root. He has extensive pulmonary metastatic disease.   The patient states that he has been taking his dexamethasone, his thyroid medication as prescribed. He was given a prescription for a lot of 4 mg tabs and he needs to take 3-4 tabs a time in order to feel some pain relief. He takes 4 tabs twice a day on average. Over the last few days, he has had increased pain in his right neck, and increased tingling and pins and needles sensation of his hands and feet with some numbness. He has had some subjective weakness of his right leg secondary to pain. He has had extensive muscle cramps of his legs bilaterally and swelling of his ankles and feet for the last 3-4 days. He denies changes in his constipation which is chronic. He denies changes in urinary habits. He has had several visits to the emergency department for similar complaints in the  last week. He is being admitted for pain control, evaluation of his lower extremity edema.  In the emergency department, his labs were notable for hemoglobin of 10.9. His BMP was within normal limits.  No imaging has been completed. His TSH was 14 last checked in January 2015.  Review of Systems:  General:  Denies fevers, chills, weight loss or gain HEENT:  Denies changes to hearing and vision, rhinorrhea, sinus congestion, sore throat CV: Occasional palpitations, positive lower extremity edema.  PULM:  Denies SOB, wheezing, cough.   GI:  Denies nausea, vomiting, constipation, diarrhea.   GU:  Denies dysuria, frequency, urgency ENDO:  Denies polyuria, polydipsia.   HEME:  Denies hematemesis, blood in stools, melena, abnormal bruising or bleeding.  LYMPH:  Denies lymphadenopathy.   MSK:   chronic progressive arthralgias, myalgias.   DERM:  Denies skin rash or ulcer.   NEURO:  per history of present illness  PSYCH:  Denies anxiety and depression.    Past Medical History  Diagnosis Date  . Cancer     Thyroid  . Thyroid disease   . Hypertension   . Pneumonia    Past Surgical History  Procedure Laterality Date  . Tendon repair    . Thyroidectomy     Social History:  reports that he quit smoking about 2 years ago. His smoking use included Cigarettes. He smoked 0.00 packs per day for 10 years. He has never used smokeless tobacco. He reports that he drinks about 4.8 ounces of alcohol  per week. He reports that he uses illicit drugs (Marijuana). Lives with his mother in an apartment. He emulates with a cane. He has no home health services and is not followed by hospice. He has met with palliative care during previous admissions.  Allergies  Allergen Reactions  . No Known Allergies     Family History  Problem Relation Age of Onset  . Diabetes Mother   . Diabetes Brother      Prior to Admission medications   Medication Sig Start Date End Date Taking? Authorizing Provider   dexamethasone (DECADRON) 4 MG tablet Take 1 tablet (4 mg total) by mouth 3 (three) times daily. 01/01/14   Robbie Lis, MD  docusate sodium 100 MG CAPS Take 100 mg by mouth 2 (two) times daily. 01/01/14   Robbie Lis, MD  HYDROmorphone (DILAUDID) 4 MG tablet Take 1 tablet (4 mg total) by mouth every 4 (four) hours as needed for severe pain. 01/02/14   Ephraim Hamburger, MD  ibuprofen (ADVIL,MOTRIN) 200 MG tablet Take 200-400 mg by mouth every 6 (six) hours as needed for mild pain.    Historical Provider, MD  levothyroxine (SYNTHROID, LEVOTHROID) 100 MCG tablet Take 100 mcg by mouth daily before breakfast.     Historical Provider, MD  Multiple Vitamin (MULTIVITAMIN WITH MINERALS) TABS tablet Take 1 tablet by mouth daily.    Historical Provider, MD  OVER THE COUNTER MEDICATION Take 1 tablet by mouth daily. Green tea    Historical Provider, MD  OVER THE COUNTER MEDICATION Take 1 tablet by mouth at bedtime. OTC arthritis supplement    Historical Provider, MD  OVER THE COUNTER MEDICATION Take 1 tablet by mouth 3 (three) times daily. OTC supplement:Grandma's herb    Historical Provider, MD  pantoprazole (PROTONIX) 40 MG tablet Take 1 tablet (40 mg total) by mouth daily. 01/01/14   Robbie Lis, MD  traMADol (ULTRAM) 50 MG tablet Take 50 mg by mouth at bedtime as needed for moderate pain.     Historical Provider, MD   Physical Exam: Filed Vitals:   01/07/14 4825 01/07/14 0718 01/07/14 0719 01/07/14 0802  BP: 150/100 132/88 132/88 138/83  Pulse: 98 94 93 89  Temp: 98.7 F (37.1 C)  97.5 F (36.4 C) 97.8 F (36.6 C)  TempSrc: Oral  Oral Oral  Resp: '18  18 18  ' SpO2: 96% 95% 97% 97%     General:   African American male, no acute distress, C-spine collar in place   Eyes:  PERRL, anicteric, non-injected.  ENT:  Nares clear.  OP clear, non-erythematous without plaques or exudates.  MMM.  Neck:  Supple,  unable to discern thyromegaly or JVD secondary to C-spine collar  Cardiovascular:  RRR,  normal S1, S2, without m/r/g.  2+ pulses, warm extremities  Respiratory:  CTA bilaterally without increased WOB.  Abdomen:  NABS.  Soft, mildly distended, nontender to palpation    Skin:  No rashes or focal lesions.  Musculoskeletal:  Normal bulk and tone.  1+ bilateral LE edema.  Psychiatric:  A & O x 4.  Appropriate affect.  Neurologic:  CN 3-12 intact.  5/5 strength upper extremities, left lower extremity. Right lower extremity 4/5 secondary to pain. Endorses some decreased sensation of his bilateral hands and feet. Rest of sensation to light touch intact. No dysmetria    Labs on Admission:  Basic Metabolic Panel:  Recent Labs Lab 01/07/14 0420  NA 141  K 4.2  CL 105  CO2 26  GLUCOSE 95  BUN 12  CREATININE 1.00  CALCIUM 8.8   Liver Function Tests: No results found for this basename: AST, ALT, ALKPHOS, BILITOT, PROT, ALBUMIN,  in the last 168 hours No results found for this basename: LIPASE, AMYLASE,  in the last 168 hours No results found for this basename: AMMONIA,  in the last 168 hours CBC:  Recent Labs Lab 01/07/14 0420  WBC 4.5  NEUTROABS 3.2  HGB 10.9*  HCT 33.9*  MCV 97.1  PLT 282   Cardiac Enzymes: No results found for this basename: CKTOTAL, CKMB, CKMBINDEX, TROPONINI,  in the last 168 hours  BNP (last 3 results) No results found for this basename: PROBNP,  in the last 8760 hours CBG: No results found for this basename: GLUCAP,  in the last 168 hours  Radiological Exams on Admission: No results found.  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 The patient has poor insight into his disease, and wants to be cured.  He has severe pain related to his malignancy. -  Oncology consultation  -  Will ask radiation oncology  to speak with patient again -  Palliative care for assistance with pain management  -  Check TSH  -  Continue Synthroid  -  Continue dexamethasone -  Continue C-spine collar -  Start MS Contin 23m q12h -  Continue oral Dilaudid 875mPO with IV for breakthrough  -  Patient states the gabapentin, Lyrica have not helped and he does not want to restart them -  Patient has previously been on 150 mcg fentanyl patch which he no longer uses -  Start Robaxin   Lower extremity edema, concerning for lymphatic spread of his cancer -  Echocardiogram -  Urinalysis to check for proteinuria -  Vascular duplex lower extremities to rule out DVT -  Consider further abdominal imaging, but will defer to oncology  Hypertension, currently on no blood pressure medications  - Trend blood pressure   Anxiety -  Ativan scheduled and when necessary -  Consider trial of SSRI/SNRI  Normocytic anemia, likely anemia of chronic disease -  Check folate, TSH, B12, iron level  Diet:  Regular  Access:  PIV  IVF:  Off  Proph:   Lovenox  Code Status:  full code  Family Communication:  patient alone  Disposition Plan: Admit to [facility] Time spent: 60 min [facility] Pager [phone] 7PM-7AM, please contact night-coverage [email or web address] Password [email or web address]/26/2015, 8:32 AM

## 2014-01-08 ENCOUNTER — Telehealth: Payer: Self-pay | Admitting: *Deleted

## 2014-01-08 DIAGNOSIS — S129XXA Fracture of neck, unspecified, initial encounter: Secondary | ICD-10-CM

## 2014-01-08 DIAGNOSIS — I10 Essential (primary) hypertension: Secondary | ICD-10-CM

## 2014-01-08 LAB — CBC
HCT: 35.6 % — ABNORMAL LOW (ref 39.0–52.0)
Hemoglobin: 11.5 g/dL — ABNORMAL LOW (ref 13.0–17.0)
MCH: 31.4 pg (ref 26.0–34.0)
MCHC: 32.3 g/dL (ref 30.0–36.0)
MCV: 97.3 fL (ref 78.0–100.0)
PLATELETS: 316 10*3/uL (ref 150–400)
RBC: 3.66 MIL/uL — ABNORMAL LOW (ref 4.22–5.81)
RDW: 15.5 % (ref 11.5–15.5)
WBC: 7.5 10*3/uL (ref 4.0–10.5)

## 2014-01-08 LAB — BASIC METABOLIC PANEL
BUN: 14 mg/dL (ref 6–23)
CALCIUM: 9.7 mg/dL (ref 8.4–10.5)
CHLORIDE: 100 meq/L (ref 96–112)
CO2: 27 mEq/L (ref 19–32)
Creatinine, Ser: 0.93 mg/dL (ref 0.50–1.35)
Glucose, Bld: 218 mg/dL — ABNORMAL HIGH (ref 70–99)
Potassium: 4.4 mEq/L (ref 3.7–5.3)
Sodium: 140 mEq/L (ref 137–147)

## 2014-01-08 LAB — FOLATE RBC: RBC FOLATE: 729 ng/mL — AB (ref >280)

## 2014-01-08 MED ORDER — HYDROMORPHONE HCL 2 MG PO TABS
4.0000 mg | ORAL_TABLET | ORAL | Status: DC | PRN
  Administered 2014-01-09 – 2014-01-20 (×30): 4 mg via ORAL
  Filled 2014-01-08 (×34): qty 1

## 2014-01-08 MED ORDER — HYDROMORPHONE HCL PF 2 MG/ML IJ SOLN
2.0000 mg | INTRAMUSCULAR | Status: DC | PRN
Start: 2014-01-08 — End: 2014-01-09
  Administered 2014-01-08 – 2014-01-09 (×8): 2 mg via INTRAVENOUS
  Filled 2014-01-08 (×8): qty 1

## 2014-01-08 MED ORDER — LEVOTHYROXINE SODIUM 100 MCG PO TABS: 100.0000 ug | ORAL_TABLET | Freq: Every day | ORAL | Status: DC

## 2014-01-08 NOTE — Progress Notes (Signed)
I talked to Mr Kessinger today regarding his pain medications.  He refused his MS Contin, saying that it does not work for him.  I explained that it is a long acting medication and if he takes it every 12 hours as it is scheduled, it would most likely help him.  He told me that he was not a drug addict and he was not going to take different kinds of pain medications especially like the ones that Wadie Lessen was talking to him about taking.  I told Mr Schellenberg that it is a good thing that [name] is trying to do with him, to try a couple of different pills while in the hospital to see which ones will work for him to control his pain so that when he went home he could use the same ones.  He said no.  He is not willing to try different kinds of pills.  He said that he wanted the IV Dilaudid medication increased to get his pain under control.  I talked to him on two other occasions regarding medications and he did not want to hear what I was trying to tell him.  Continued to monitor and care for patient.  Suzan Slick

## 2014-01-08 NOTE — Telephone Encounter (Signed)
Orders received and read back from Dr. Jana Hakim to notify pt. and staff for the Hospitalist is managing patient's pain.  Frank Ward and RN notified.

## 2014-01-08 NOTE — Progress Notes (Signed)
[facility] PROGRESS NOTE   Frank Ward XVQ:[id number] DOB: 01-16-1971 DOA: 01/07/2014 PCP: No PCP Per Patient  HPI/Subjective: Patient complaining about significant pain involving his neck and his legs. Patient is agitated and frustrated about his pain. 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 The patient has poor insight into his disease, and wants to be cured.  - He has severe pain related to his malignancy.  - Oncology consultation  - Will ask radiation oncology to speak with patient again  - Palliative care for assistance with pain management  - Check TSH  - Continue Synthroid  - Continue dexamethasone  - Continue C-spine collar  - Start MS Contin 30mg  q12h  - Continue oral Dilaudid 8mg  PO with IV for breakthrough  - Patient states the gabapentin, Lyrica have not helped and he does not want to restart them  - Patient has previously been on 150 mcg fentanyl patch which he no longer uses  - Start Robaxin   Lower extremity edema, concerning for lymphatic spread of his cancer  - Echocardiogram  - Urinalysis to check for proteinuria  - Vascular duplex lower extremities to rule out DVT  - Consider further abdominal imaging, but will defer to oncology   Hypertension, currently on no blood pressure medications  - Trend blood pressure   Anxiety  - Ativan scheduled and when necessary  - Consider trial of SSRI/SNRI   Normocytic anemia - Check folate, ferritin, folate and iron/ferritin levels are normal, likely anemia of chronic disease.   Code Status: Full  code Family Communication: Plan discussed with the patient. Disposition Plan: Remains inpatient   Consultants:  Medical oncology.  Radiation oncology  Procedures:  None  Antibiotics:  None   Objective: Filed Vitals:   01/08/14 0522  BP: 140/85  Pulse: 107  Temp: 98.2 F (36.8 C)  Resp: 18    Intake/Output Summary (Last 24 hours) at 01/08/14 1343 Last data filed at 01/08/14 1000  Gross per 24 hour  Intake   1680 ml  Output    275 ml  Net   1405 ml   Filed Weights   01/07/14 0856  Weight: 86.1 kg (189 lb 13.1 oz)    Exam: General: Alert and awake, oriented x3, agitated, anxious.  HEENT: anicteric sclera, pupils reactive to light and accommodation, EOMI CVS: S1-S2 clear, no murmur rubs or gallops Chest: clear to auscultation bilaterally, no wheezing, rales or rhonchi Abdomen: soft nontender, nondistended, normal bowel sounds, no organomegaly Extremities: no cyanosis, clubbing or edema noted bilaterally Neuro: Cranial nerves II-XII intact, no focal neurological deficits  Data Reviewed: Basic Metabolic Panel:  Recent Labs Lab 01/07/14 0420 01/08/14 0408  NA 141 140  K 4.2 4.4  CL 105 100  CO2 26 27  GLUCOSE 95 218*  BUN 12 14  CREATININE 1.00 0.93  CALCIUM 8.8 9.7   Liver Function Tests: No results found for this basename: AST, ALT, ALKPHOS, BILITOT, PROT, ALBUMIN,  in the last 168 hours No results found for this basename: LIPASE, AMYLASE,  in the last 168 hours  No results found for this basename: AMMONIA,  in the last 168 hours CBC:  Recent Labs Lab 01/07/14 0420 01/08/14 0408  WBC 4.5 7.5  NEUTROABS 3.2  --   HGB 10.9* 11.5*  HCT 33.9* 35.6*  MCV 97.1 97.3  PLT 282 316   Cardiac Enzymes: No results found for this basename: CKTOTAL, CKMB, CKMBINDEX, TROPONINI,  in the last 168 hours BNP (last 3 results) No results found for this basename: PROBNP,  in the last 8760 hours CBG: No results found for this basename: GLUCAP,  in the last  168 hours  Micro No results found for this or any previous visit (from the past 240 hour(s)).   Studies: No results found.  Scheduled Meds: . dexamethasone  4 mg Oral TID  . docusate sodium  100 mg Oral BID  . enoxaparin (LOVENOX) injection  40 mg Subcutaneous Q24H  . [START ON 01/14/2014] levothyroxine  100 mcg Oral QAC breakfast  . LORazepam  1 mg Oral BID  . meloxicam  15 mg Oral Daily  . methocarbamol  500 mg Oral TID  . morphine  30 mg Oral Q12H  . multivitamin with minerals  1 tablet Oral Daily  . pantoprazole  40 mg Oral Daily  . senna  2 tablet Oral QHS   Continuous Infusions:      Time spent: 35 minutes    Verlee Monte  [facility] Pager [phone] If 7PM-7AM, please contact night-coverage at [email or web address], password [facility] 01/08/2014, 1:43 PM  LOS: 1 day

## 2014-01-08 NOTE — Progress Notes (Signed)
CSW received referral for crisis counseling stating that patient feels he was discharged prematurely in past and that that affected his treatment; please help patient understand what his options are.  CSW received phone call from Palliative Medicine NP, Wadie Lessen stating that she met at length with pt today and palliative medicine NP feels that a team approach to further discussions with pt will be the best option to really address pt options instead of multiple members of the medical team meeting with pt at different times. CSW and palliative medicine NP, Wadie Lessen plan to meet with pt tomorrow 01/09/2014 at 12 pm. CSW notified attending MD in order for attending MD to also be involved in conversation. CSW notified 3 [place] Probation officer, Aurelio Jew in order for representative from [place] to be present for the meeting. CSW will ask RNCM if RNCM can also be present.   CSW to complete full psychosocial assessment following tomorrow's meeting.   CSW to continue to follow.  Alison Murray, MSW, [place] Work [phone]

## 2014-01-08 NOTE — Progress Notes (Addendum)
During assessment, pt requested I "sneak" him addition pain medication. I explained to him all medications require a doctors order, and I will not give him additional medication-he can only have that which is order. Pt stated pain medication is not enough, wants more IV dilaudid. Offered pt PO pain medication and ativan, pt refused. Requested to speak with Charge nurse. Ailene Ravel RN spoke with pt-she called on-call, orders changed.

## 2014-01-08 NOTE — Telephone Encounter (Signed)
Patient called asking for Dr. Lora Paula to order pain medicines.  Reports  He is in room 1321 and has stage 5 cancer.  Instructed to notify his 3E nurse.  Repors the doctor gave him his card is why he's calling this offiice.

## 2014-01-08 NOTE — Consult Note (Signed)
Patient Frank Ward      DOB: [birth year]      QIH:[id number]     Consult Note from the Palliative Medicine Team at [facility] Requested by: Dr Hartford Poli      PCP: No PCP Per Patient Reason for Consultation:Clarifiction of [place] and options     Phone Number:None   This NP Wadie Lessen reviewed medical records, received report from team, assessed the patient and then meet at the patient's  to discuss symptom recommendations.  Palliative Medicine team meet with this patient during his last hospitalization. This is complicated by layers of psych-social issues   A brief discussion was had today regarding advanced directives.  SW has documents and will place hard copy in chart.  Values and goals of care important to patient  were attempted to be elicited.  Concept of Palliative Care was reintroduced    Questions and concerns addressed.  PMT will continue to support holistically.   Goals of Care: 1.  Code Status: Full code   2. Scope of Treatment: Patient is open to all available and offered medical interventions to prolong life at this time.  3. Disposition:  Pending outcomes   4. Symptom Management:   1. Anxiety/Agitation:          Consider anti-anxiety medications  2. Pain:  Long difficult discussion with patient on pain  management strategies.          -Recommend increasing long acting MS Contin and make regular adjustment until pain control         -Continue prn Dilaudid        -Reduce Poly-pharmacy, dc Robaxin and Mobic        -add anti anxiety medciations    5. Psychosocial:  Recommend psych-consult to assist with medication changes and deem capacity.  Emotional support offered to patient, attempted to stay present with active listening  And support.   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 Of note, patient has been seen for medical opinion at [facility] and [facility] as well. At the time of the initial consultation with Frank Ward,he had previously received radiation to the T9 vertebral body in [place]. Plans for chemotherapy and more radiation were made in January, which patient eventually failed to follow up. He was to complete palliative radiation for intracranial metastatic disease and the upper cervical spine and possibly to the T1 vertebral body. He states that he stopped "5 treatments away due to Pneumonia". He was not seen in office since.  After repeated visits to the Emergency Room with poor pain control and despite reporting compliance to steroids and pain management, Frank Ward presented to the [facility] Emergency Department on 45/25 with increasing right neck pain, and numbness and tingling in all digits. He also experienced subjective right lower extremity weakness due to pain. In addition, he noticed a 4 day history of bilateral lower extremity swelling (with a negative doppler). He denied worsening chronic constipation or urinary incontinence or retention. Labs showed mild anemia (Hb 10.9) and normal chemistries. TSH on January of 2015 was 14, new TSH pending . He had no other medical complaints such as shortness sof breath, chest pain, headaches, double vision or seizures.  We have been requested to see the patient in consultation  with further recommendations on his care.  Oncological History:  Frank Ward is a 43 y.o. male with a history of metastatic thyroid cancer diagnosed in July 2014 with findings on paraspinal biopsy. His medical oncology care has been rendered by Frank Ward of [facility]. Frank Ward stated that patient was lost to followup . In review, he had presented to Frank Ward for evaluation of thyroid mass, multiple lung and bone lesions suspicious for metastatic disease on Jan 02, 2013. This was a follow up appointment for an emergency room visit for acute onset backpain several weeks prior. During this time the patient was homeless. CT of Head on December 10, 2012 demonstrated no acute intracranial processes but CT Chest, Abdomen and Pelvis on the same date showed innumerable bilateral pulmonary nodules of varying sizes with an associated 3 x 3.5 cm soft tissue mass with associated bone destruction of the right ilium and a heterogeneous enlargement of the right thyroid gland with right inferior extension and leftward deviation of the trachea concerning malignancy. He had a PET scan (01/03/13) showed widespread foci of abnormal uptake consistent with metastatic disease and a dominant mass in the right thyroid lobe suspicious for thyroid malignancy with multiple bony metastases and a large T9 destructive lesion likely invading the spinal canal with significant spinal lesions at C3 and C4 and T1. The lesions are T12 and L1 were lytic and hypermetabolic. There were also lesions in the right sacral and adjacent iliac bone and more anterior in the right iliac bone. 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 He had a total thyroidectomy on 01/29/2013 by Dr. Roena Malady.  CT scan of the head (02/05/2013) showed 2 lesions--an abnormal hyperdense focus in the posterior aspect of there right lateral ventricle measuring 10 x 12 mm and a 5 mm diameter hyperdense nodule in the fourth ventricle-- in the ventricle occupying but patient was asymptomatic. He underwent radiation to his T-9 spine based on significant pain and marked bony destruction with possible cord impingement. He had a total of 300 cGY/Fx in 10 fractions from 01/30/2013 to 02/12/2013 by Dr. Noreene Filbert. [facility] narcotic regimen.   He followed up with his medical oncologists in July 20 with his TSH greater than 35, ready to proceed with radioactive iodide ablation. Because of his social situation, he was required to be admitted to the hospital for treatment. His plan for treatment was radioactive iodide and if that did not work to consider Sutent or other oral chemotherapies. He was scheduled for neurosurgery evaluation on July 29th, 2014. Treatment for his pain was managed by Dr. Izora Gala Phifer of Palliative care. He underwent radioactive iodide ablation on 05/02/2013 receiving a dose of 1.76 mCi of iodine 131. However given the bulky widespread metastatic disease which was I-131 avid and the large metastatic tumor size in multiple foci the likelihood of these being completely treated with I-131 was felt to be low and close follow-up was recommended. He was subsequently lost to follow-up.  Notable labs in May, 2014 included CA 19-9 of 43 (0-35 U/mL) and CEA of 1.4 (0.0 - 4.7 ng/mL), PSA of 1.7, AFP of 3.7, serum calcitonin less than 2.0 pg/mL, THY antithyroglobulin AB less than 1.0 IU/mL and a negative beta-HCG tumor marker. TSH on 03/01/2013 was 42.3 and repeated on 03/25/2013 and found to be  69.8.  In January of 2015 he presented to [facility] ED with right neck and shoulder pain. A CT of his neck and chest showed pathological fractures of C3 and C4 vertebrae with metastases to the cervical spine. He was evaluated by neurosurgery and recommended for an MRI of the spine but patient signed out AMA from the ED. He presented to [facility] ED on 1/14 due to right neck pain radiating down the shoulder and dizziness. MRI of the brain and C-spine showed multiple metastases to the brain, progressive pathological compression fracture of C3 and C4 with extensive extraosseous tumor spread.  He was seen in consultation by Frank Ward at [facility] with recommendations.  He has have progressive collapse of pathologic fractures at C3 and to a  lesser extent C4. Progressive right foraminal and central canal stenosis at C2-3. Narrowing of the central canal to 9 mm at the level of C3 with mass effect on the spinal cord and mild cord edema. Progressive tumor encroachment into the spinal canal at the C3 level. MRI from last week demonstrated reactive marrow changes versus early osseous tumor and T3 spruced T6 and T11 vertebrae. There is bulky tumor with epidural and extraosseous extension at T12 in the right L1 neural foramina. He also had bulky sacral tumor with extraosseous extension including impingement of the exiting left S1 nerve root. He has extensive pulmonary metastatic disease.  He was to be seen at our office and at the Radiation Oncology clinic by Dr. [name] in February of 2015 to receive radiation treatments, but he failed to keep up his appointments.  Above reflected in oncology note  Complicated by psycho-social issues related to coping skills, understanding of health care system logistics and regulations, financial issues and poor support system.    ROS: Pain that centers right hip area and radiates down side of leg, intermittent head and neck pain, weakness   PMH:  Past Medical History  Diagnosis Date  . Cancer     Thyroid  . Thyroid disease   . Hypertension   . Pneumonia      PSH: Past Surgical History  Procedure Laterality Date  . Tendon repair    . Thyroidectomy     I have reviewed the [place] and SH and  If appropriate update it with new information. Allergies  Allergen Reactions  . No Known Allergies    Scheduled Meds: . dexamethasone  4 mg Oral TID  . docusate sodium  100 mg Oral BID  . enoxaparin (LOVENOX) injection  40 mg Subcutaneous Q24H  . [START ON 01/14/2014] levothyroxine  100 mcg Oral QAC breakfast  . LORazepam  1 mg Oral BID  . meloxicam  15 mg Oral Daily  . methocarbamol  500 mg Oral TID  . morphine  30 mg Oral Q12H  . multivitamin with minerals  1 tablet Oral Daily  . pantoprazole  40 mg  Oral Daily  . senna  2 tablet Oral QHS   Continuous Infusions:  PRN Meds:.bisacodyl, HYDROmorphone (DILAUDID) injection, HYDROmorphone, LORazepam, ondansetron (ZOFRAN) IV, ondansetron, polyethylene glycol    BP 140/85  Pulse 107  Temp(Src) 98.2 F (36.8 C) (Oral)  Resp 18  Ht 6\' 4"  (1.93 m)  Wt 86.1 kg (189 lb 13.1 oz)  BMI 23.11 kg/m2  SpO2 96%   PPS:50 %   Intake/Output Summary (Last 24 hours) at 01/08/14 1226 Last data filed at 01/08/14 1000  Gross per 24 hour  Intake   1680 ml  Output  275 ml  Net   1405 ml    Physical Exam:  General: NAD HEENT:  Moist membranes, no exudate Chest:   CTA CVS: RRR Abdomen: soft, mildly distended Ext: without edema Neuro: orietned X#  Labs: CBC    Component Value Date/Time   WBC 7.5 01/08/2014 0408   RBC 3.66* 01/08/2014 0408   HGB 11.5* 01/08/2014 0408   HCT 35.6* 01/08/2014 0408   PLT 316 01/08/2014 0408   MCV 97.3 01/08/2014 0408   MCH 31.4 01/08/2014 0408   MCHC 32.3 01/08/2014 0408   RDW 15.5 01/08/2014 0408   LYMPHSABS 0.5* 01/07/2014 0420   MONOABS 0.6 01/07/2014 0420   EOSABS 0.2 01/07/2014 0420   BASOSABS 0.0 01/07/2014 0420    BMET    Component Value Date/Time   NA 140 01/08/2014 0408   K 4.4 01/08/2014 0408   CL 100 01/08/2014 0408   CO2 27 01/08/2014 0408   GLUCOSE 218* 01/08/2014 0408   BUN 14 01/08/2014 0408   CREATININE 0.93 01/08/2014 0408   CALCIUM 9.7 01/08/2014 0408   GFRNONAA >90 01/08/2014 0408   GFRAA >90 01/08/2014 0408    CMP     Component Value Date/Time   NA 140 01/08/2014 0408   K 4.4 01/08/2014 0408   CL 100 01/08/2014 0408   CO2 27 01/08/2014 0408   GLUCOSE 218* 01/08/2014 0408   BUN 14 01/08/2014 0408   CREATININE 0.93 01/08/2014 0408   CALCIUM 9.7 01/08/2014 0408   PROT 5.6* 12/30/2013 0355   ALBUMIN 3.0* 12/30/2013 0355   AST 13 12/30/2013 0355   ALT 17 12/30/2013 0355   ALKPHOS 69 12/30/2013 0355   BILITOT <0.2* 12/30/2013 0355   GFRNONAA >90 01/08/2014 0408   GFRAA >90 01/08/2014 0408        Time In Time Out Total Time Spent with Patient Total Overall Time  0745 0855 70 min 70 min    Greater than 50%  of this time was spent counseling and coordinating care related to the above assessment and plan.   Wadie Lessen NP  Palliative Medicine Team Team Phone # [phone] Pager [phone]  Discussed with Dr Hartford Poli and Forrestine Him and [facility] director

## 2014-01-08 NOTE — Progress Notes (Signed)
Chart Note: I spoke with Dr. [name] this morning regarding possible radiosurgery to cervical spine. Because of previous XRT and tumor currently sitting on spinal cord, he would not be a candidate for radiosurgery without debulking tumor first. Dr. [name] had him reviewed at neurosurgical conference and neurosurgery did not feel that he was a candidate for surgery. Therefore we recommend palliative/comfort care without XRT/radiosurgery. I will communicate this information to patient when I see him later today.

## 2014-01-09 MED ORDER — MAGNESIUM CITRATE PO SOLN
1.0000 | Freq: Once | ORAL | Status: DC
  Filled 2014-01-09: qty 296

## 2014-01-09 MED ORDER — MAGNESIUM CITRATE PO SOLN
1.0000 | Freq: Once | ORAL | Status: AC
  Administered 2014-01-09: 1 via ORAL
  Filled 2014-01-09: qty 296

## 2014-01-09 MED ORDER — HYDROMORPHONE HCL PF 2 MG/ML IJ SOLN
2.0000 mg | INTRAMUSCULAR | Status: DC | PRN
  Administered 2014-01-09 – 2014-01-10 (×7): 2 mg via INTRAVENOUS
  Filled 2014-01-09 (×9): qty 1

## 2014-01-09 MED ORDER — MORPHINE SULFATE ER 30 MG PO TBCR
45.0000 mg | EXTENDED_RELEASE_TABLET | Freq: Two times a day (BID) | ORAL | Status: DC
  Administered 2014-01-09 – 2014-01-10 (×2): 45 mg via ORAL
  Filled 2014-01-09 (×4): qty 1

## 2014-01-09 NOTE — Progress Notes (Signed)
Pt was awake and sitting up in bed when I arrived. Pt said he slept well last night and was feeling better. Pt said he was still bothered with "this neck thing" saying it was from when they "threw him down on his head when he was here in January." (Neck brace was lying on the table. Pt said they are saying something about surgery but he thinks he wants to go down home [facility] are) and let them do it say he doesn't "really trust them" here. Pt's attitude was much like it was during our visit from his previous hospitalization. Pt also said he is having problems remembering. I asked if he had shared that with his doctor (who walked in during the conversation and took over from there).  Ernest Haber Chaplain  01/09/14 1200  Clinical Encounter Type  Visited With Patient

## 2014-01-09 NOTE — Progress Notes (Signed)
[facility] PROGRESS NOTE   Frank Ward JJK:[id number] DOB: Sep 04, 1970 DOA: 01/07/2014 PCP: No PCP Per Patient  HPI/Subjective: Patient seen twice today, stating a.m. and with multidisciplinary team. Evaluated the patient with Mrs. Davis Gourd with palliative care, CSW and the floor director. Patient gets agitated easily, trying to set discharge plan for him.  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 The patient has poor insight into his disease, and wants to be cured.  - He has severe pain related to his malignancy.  - Oncology consultation  - Will ask radiation oncology to speak with patient again  - Palliative care for assistance with pain management  - Check TSH  - Continue Synthroid  - Continue dexamethasone  - Per medical oncology, I-131 scan which is pending, to decide about ablative therapy.  Cancer related pain - Has metastases to his neck, balance and brain. Continue C-spine collar  - Patient is on OxyContin 30 mg, and this was switched to MS Contin 45 mg every 12 hours. - Continue oral Dilaudid 4 mg PO with IV for breakthrough  - Patient states the gabapentin, Lyrica have not helped and he does not want to restart them  - Patient has previously been on 150 mcg fentanyl patch which he no longer uses  - Start Robaxin   Lower extremity edema, concerning for lymphatic spread of his cancer  - Echocardiogram  - Urinalysis to check for proteinuria  - Vascular duplex lower extremities to rule out DVT  - Consider further abdominal imaging, but will defer to oncology   Hypertension, currently on no blood pressure medications  - Trend blood pressure    Anxiety  - Ativan scheduled and when necessary  - Consider trial of SSRI/SNRI   Normocytic anemia - Check folate, ferritin, folate and iron/ferritin levels are normal, likely anemia of chronic disease.   Code Status: Full code Family Communication: Plan discussed with the patient. Disposition Plan: Remains inpatient   Consultants:  Medical oncology.  Radiation oncology  Procedures:  None  Antibiotics:  None   Objective: Filed Vitals:   01/09/14 0443  BP: 135/78  Pulse: 113  Temp: 98.6 F (37 C)  Resp: 18    Intake/Output Summary (Last 24 hours) at 01/09/14 1337 Last data filed at 01/09/14 1000  Gross per 24 hour  Intake    840 ml  Output      0 ml  Net    840 ml   Filed Weights   01/07/14 0856  Weight: 86.1 kg (189 lb 13.1 oz)    Exam: General: Alert and awake, oriented x3, agitated, anxious.  HEENT: anicteric sclera, pupils reactive to light and accommodation, EOMI CVS: S1-S2 clear, no murmur rubs or gallops Chest: clear to auscultation bilaterally, no wheezing, rales or rhonchi Abdomen: soft nontender, nondistended, normal bowel sounds, no organomegaly Extremities: no cyanosis, clubbing or edema noted bilaterally Neuro: Cranial nerves II-XII intact, no focal neurological deficits  Data Reviewed: Basic Metabolic Panel:  Recent Labs Lab 01/07/14 0420 01/08/14 0408  NA 141 140  K 4.2 4.4  CL 105 100  CO2 26 27  GLUCOSE 95 218*  BUN 12 14  CREATININE 1.00 0.93  CALCIUM  8.8 9.7   Liver Function Tests: No results found for this basename: AST, ALT, ALKPHOS, BILITOT, PROT, ALBUMIN,  in the last 168 hours No results found for this basename: LIPASE, AMYLASE,  in the last 168 hours No results found for this basename: AMMONIA,  in the last 168 hours CBC:  Recent Labs Lab 01/07/14 0420 01/08/14 0408  WBC 4.5 7.5  NEUTROABS 3.2  --   HGB 10.9* 11.5*  HCT 33.9* 35.6*  MCV 97.1 97.3  PLT 282 316   Cardiac Enzymes: No results found for  this basename: CKTOTAL, CKMB, CKMBINDEX, TROPONINI,  in the last 168 hours BNP (last 3 results) No results found for this basename: PROBNP,  in the last 8760 hours CBG: No results found for this basename: GLUCAP,  in the last 168 hours  Micro No results found for this or any previous visit (from the past 240 hour(s)).   Studies: No results found.  Scheduled Meds: . dexamethasone  4 mg Oral TID  . docusate sodium  100 mg Oral BID  . enoxaparin (LOVENOX) injection  40 mg Subcutaneous Q24H  . [START ON 01/14/2014] levothyroxine  100 mcg Oral QAC breakfast  . LORazepam  1 mg Oral BID  . methocarbamol  500 mg Oral TID  . morphine  45 mg Oral Q12H  . multivitamin with minerals  1 tablet Oral Daily  . pantoprazole  40 mg Oral Daily  . senna  2 tablet Oral QHS   Continuous Infusions:      Time spent: 35 minutes    Verlee Monte  [facility] Pager [phone] If 7PM-7AM, please contact night-coverage at [email or web address], password [facility] 01/09/2014, 1:37 PM  LOS: 2 days

## 2014-01-09 NOTE — Care Management Note (Signed)
    Page 1 of 1   01/09/2014     12:55:48 PM CARE MANAGEMENT NOTE 01/09/2014  Patient:  Frank Ward, Frank Ward   Account Number:  [id number]  Date Initiated:  01/07/2014  Documentation initiated by:  [facility]  Subjective/Objective Assessment:   adm: Leg swelling and pain; hx Metastatic thyroid cancer to lungs, bones, spinal cord     Action/Plan:   discharge planning   Anticipated DC Date:  01/09/2014   Anticipated DC Plan:  [place]  CM consult      Choice offered to / List presented to:             Status of service:  Completed, signed off Medicare Important Message given?   (If response is "NO", the following Medicare IM given date fields will be blank) Date Medicare IM given:   Date Additional Medicare IM given:    Discharge Disposition:    Per UR Regulation:    If discussed at Long Length of Stay Meetings, dates discussed:    Comments:  01/09/14 12:50 CM met with pt in room as part of a multidisciplinary meeting including MD, NP, CSW, Asst Dir of unit.  Pt has dispute with housing authority as far as obtaining housing.  Pt has several pending charges and the likelihood of him obtaining housing is probably not good though CSW is looking into it along with ALfs for residence.  No other CM needs were communicated at this time.  Mariane Masters, BSN, [place] [phone].   01/07/14 11:40 Pt lives with Mother Frank Ward [phone].

## 2014-01-09 NOTE — Progress Notes (Signed)
I went to speak with Mr. Monica Becton after Butch Penny informed me of the incident when she went to deliver his oral pain medication.  Patient was very anxious and agitated, pacing around the room and using profanity.  He stated that we were "F'ing" with him and that he did not agree to change his medication to oral.  When I asked him about our team conference earlier where we discussed pain management he stated that he understood he would try the oral medications however he would not be taken off the IV medication.  He further stated that the 2mg  IV Dilaudid every 2 hours "worked and I slept like a baby".  I attempted to explain that the 4mg  oral Dilaudid was a higher dose and would last longer than the IV however he was not in agreement with this.    I called and spoke with Dr. Hartford Poli and explained his current status and he will be reordering the IV Dilaudid for the patient.  I went back and explained this to Mr. Livolsi and stated I would bring the medication as soon as it was available in the pyxis.    Medication was administered at 1700 and patient was calmer when I discussed taking the long acting pain medication tonight to see if it would help.  Patient was agreeable  "as long as you don't stop my IV medication again".    Asked patient if he was OK if Butch Penny cared for him the remainder of the shift and he stated that would be fine and that he wanted to apologize to her for his earlier actions.  This information was relayed to Butch Penny and she will follow up with patient.

## 2014-01-09 NOTE — Progress Notes (Signed)
Patient Frank Ward Frank Ward      DOB: July 17, 1971      ELF:[id number]   Palliative Medicine Team at [facility] Progress Note  Subjective:  - meeting with patient, to include Dr Hartford Poli, nursing, SW and CM in an attempt to coordinate care for this patient while both hospitalized and on discharge  -verbalizes agreement to try pain management strategy including incremental increases of  extended release MS Contin  and continuing his prn Dilaudid for breakthrough until efficacy       -increase MS Contin to 45 mg every 12 hrs      -encouraged to utilize Ativan prn  -we discussed importance of including his PCP on discharge to manage pain medications  -we discussed role of hospice services (when eligible) for pain managment if needs require continuous IV doses  -discussed importance of including supports person in his plan of care  -discussed limitation of radiologic treatments options being  Offered here at [facility] and concept of second opions     Filed Vitals:   01/09/14 0443  BP: 135/78  Pulse: 113  Temp: 98.6 F (37 C)  Resp: 18   Physical exam:  General: alert and oriented, NAD HEENT: moist buccal membranes, no exudate, poor dentati on, wearing neck brace  CVS RRR Lungs: CTA Ext: with out edema   Assessment and plan:  Cancer related pain/R hip down outer aspect of leg: - -increase MS Contin to 45 mg every 12 hrs - continue Dilaudid 4 mg every 4 hrs prn breakthrough pain -re-evaluate in am and make adjustments   Total time 60 minutes.  50% of time spent in counseling and coordination of care Time in 1200 Time out [place] NP  Palliative Medicine Team Team Phone # [phone] Pager [phone]  Discussed with Dr Hartford Poli

## 2014-01-09 NOTE — Progress Notes (Signed)
Patient's bed is up high. Reminded and educated about the risk of fall and asked if we I could lower the height of the bed.He refused, saying I want it up so that I can elevate my legs.Sandie Ano RN

## 2014-01-09 NOTE — Progress Notes (Signed)
Patient had c/o constipation, refused to have miralax, instead, told me get an order for Mag citrate. Dr Hartford Poli was notified,got the order. Went to rm and scanned the med and offerd to him but after a while he decided not to take it because he was having some reflux. He mentioned to me that he will take med at shift change,so I rescheduled med administration time to 1900. Will continue to monitor the patient.- Sandie Ano RN

## 2014-01-09 NOTE — Consult Note (Signed)
I have reviewed this case with our NP and agree with the Assessment and Plan as stated.  Jamise Pentland L. Jeanice Dempsey, MD MBA The Palliative Medicine Team at [place] Team Phone: [phone] Pager: [phone]   

## 2014-01-09 NOTE — Progress Notes (Signed)
Clinical Social Work Department BRIEF PSYCHOSOCIAL ASSESSMENT 01/09/2014  Patient:  Frank Ward, Frank Ward     Account Number:  [id number]     Admit date:  01/07/2014  Clinical Social Worker:  Jacelyn Grip  Date/Time:  01/09/2014 03:00 PM  Referred by:  Physician  Date Referred:  01/09/2014 Referred for  Other - See comment   Other Referral:   Interview type:  Patient Other interview type:    PSYCHOSOCIAL DATA Living Status:  ALONE Admitted from facility:   Level of care:   Primary support name:  [name]/mother/[phone] Primary support relationship to patient:  PARENT Degree of support available:   lacking    CURRENT CONCERNS Current Concerns  Other - See comment   Other Concerns:   assisting with development of disposition plan as pt has been in ED 8 times in the past 6 months and admitted 3 times within the past 6 months.    SOCIAL WORK ASSESSMENT / PLAN CSW met with pt at bedside along with the multidisciplinary team including attending MD, palliative medicine NP, 3E unit director, and RNCM.    Pt discussed that his main concerns are his pain control and obtaining housing through the housing authority. Attending MD and Palliative Medicine NP addressed pain control concerns with patient and recommended beginning pt on an oral regimen for pt pain control. Pt continues to state that he want a second opinion regarding radiation and pt has been notified by radiation oncology MD that pt is not a candidate for further radiation and Palliative Medicine NP and attending MD notified pt that he if would like to seek a second opinion then pt would need to go to another medical center once discharged to seek that opinion. Pt appeared to express understanding.    Pt guarded in discussing his current living situation. He reports that he has an apartment, but then states that he has nowhere to go upon discharge. CSW inquired about staying with family since patient had previously  reported staying with family but patient reports it is not an option, but does not divulge any further information about why staying with family is not an option at this time. Pt requesting assistance from CSW with housing and states that he is on housing authority list but they are expecting an 18 month waiting list. CSW discussed with pt that CSW can call the Housing Authority to advocate for pt, but obtaining housing through the housing authority is a lengthy process and something that will not be able to obtained before pt discharged from the hospital. CSW discussed with pt that ALF placement may be an option for pt as facility is a medical environment and can provide pt with medication and meal. CSW explained that pt Medicaid would have to pay for placement and pt would have to sign over disability check to cover ALF placement. Pt expressed hesitancy about signing over disability check stating that he needs the money for gas, but is agreeable to CSW exploring option. CSW discussed with pt that CSW is aware that pt may not want to remain in an ALF long term, but ALF may be a good bridge until housing authority comes through. Pt will explore option and further discuss with pt if pt receives any bed offers.    CSW contacted [employer] and met with pt at bedside with Housing authority on the telephone. Pt provided permission to [employer] for CSW to speak with [employer]. Housing Authority reports that pt is on the  waiting list, but they could not provide any timeline when housing may be available. CSW inquired with Housing Authority if there were special exceptions for individuals with serious medical conditions and the housing authority states that there is not and when pt is closer to the top of the list then he will be contacted. Pt was present and heard all of the information provided by the housing authority.    CSW completed pt FL2 and initiated ALF/FCH search to [facility]  and [facility]. CSW will follow up with pt regarding any potential offers.    CSW to follow up with pt to further discuss a discharge plan if pt becomes medically ready for discharge and no ALF options or pt not agreeable to ALF.    CSW to continue to follow to provide support and assist with pt discharge plan.   Assessment/plan status:  Psychosocial Support/Ongoing Assessment of Needs Other assessment/ plan:   discharge planning   Information/referral to community resources:   [facility] ALF/F[facility] list    PATIENT'S/FAMILY'S RESPONSE TO PLAN OF CARE: Pt alert and oriented x 4. Pt agitated during parts of discussion, but expressed being in pain and RN provided pain medication. Pt eager to obtain own apartment, but guarded in discussion about what options he may have as far as staying with family at discharge. Pt agreeable to ALF search, but appears hesitant about going to ALF and having to sign his disability income over to a facility. Pt appears in denial and resistant to accept the assistance recommended. Pt has complex psycho-social issues related to coping skills, understanding of health care system logistics and regulations, financial issues and poor support system. Pt agreeable to CSW follow up to further discuss disposition plan.    Alison Murray, MSW, [place] Work [phone]

## 2014-01-09 NOTE — Progress Notes (Signed)
Received a call from Jena Gauss the the oncology floor director. After we talked to the patient was then 45 minutes about formulating a plan including increasing the oral pain medication to MS Contin 45 mg twice a day and 4 mg of oral Dilaudid. Patient agreed when I was in the room, the presence of Mrs. Davis Gourd and Jena Gauss RN and that [place]. Patient letter is that she never agreed to take him off of IV medication, per report at agitated and frustrated. Patient requested his  IV Dilaudid to be restarted back, I restarted pain.  Birdie Hopes Pager: [phone] 01/09/2014, 4:53 PM

## 2014-01-09 NOTE — Progress Notes (Signed)
This writer went to patient's rm to administer his Dilaudid PRN. I scanned the med and offered,at that time he asked about the IV Dilaudid PRN, I told him that MD had d/c'd the med,he started to get angry, said " get out ", I don't want to see your face. Left the patient's rm and went to Richardson Landry, our Director to talk to the patient.- Sandie Ano RN

## 2014-01-10 ENCOUNTER — Inpatient Hospital Stay (HOSPITAL_COMMUNITY): Payer: Medicaid Other

## 2014-01-10 DIAGNOSIS — R209 Unspecified disturbances of skin sensation: Secondary | ICD-10-CM

## 2014-01-10 DIAGNOSIS — C7931 Secondary malignant neoplasm of brain: Secondary | ICD-10-CM

## 2014-01-10 DIAGNOSIS — C7949 Secondary malignant neoplasm of other parts of nervous system: Secondary | ICD-10-CM

## 2014-01-10 LAB — TSH: TSH: 27.12 u[IU]/mL — AB (ref 0.350–4.500)

## 2014-01-10 MED ORDER — MORPHINE SULFATE ER 30 MG PO TBCR
60.0000 mg | EXTENDED_RELEASE_TABLET | Freq: Two times a day (BID) | ORAL | Status: DC
  Administered 2014-01-10 – 2014-01-27 (×35): 60 mg via ORAL
  Filled 2014-01-10 (×2): qty 2
  Filled 2014-01-10: qty 4
  Filled 2014-01-10 (×30): qty 2

## 2014-01-10 MED ORDER — HYDROMORPHONE HCL PF 2 MG/ML IJ SOLN
2.0000 mg | INTRAMUSCULAR | Status: DC | PRN
  Administered 2014-01-10 – 2014-01-12 (×9): 2 mg via INTRAVENOUS
  Filled 2014-01-10 (×9): qty 1

## 2014-01-10 NOTE — Progress Notes (Signed)
Pt was awake and sitting up in bed when I arrived. When asked he said he was doing ok "under the circumstances." He began to explain, in a round-about-way, that they (speaking of doctor/nurses) saying it is too late for radiation. During the conversation he said he feels if he gets it the tumors in his leg will go away and he will be better. Pt said he doesn't want doctors to tell him how much longer he has because he doesn't want to sit around and count the days. He said, a couple times, he has too much to do. He said the devil talks to him and tells him his time is up, but he believes God has more time for him and he can't believe what the devil is saying. When asked about his statement, he said he was speaking spiritually.  Pt still wants radiation treatment and during the course of the conversation seemed to give himself diagnosis and couldn't understand why drs were not treating him with treatment. He said the drs said his pain in his leg is coming from his spine and then he said, "but that ain't right." Pt mentioned that he had talked with his mom.  I asked if she or his sister would help take care of him when he leaves.  He replied he can still take care of himself. Pt talked more spiritually today than in our previous visits and even accepted prayer.  He also said he has a Company secretary to whom he has been talking by phone. Pt was very appreciative of flowers delivered by our volunteer and wanted me to hand them to him so he could smell them during our visit and put asked me to put them in the window so he could see them.   Ernest Haber Chaplain  01/10/14 1500  Clinical Encounter Type  Visited With Patient

## 2014-01-10 NOTE — Progress Notes (Signed)
Patient GB:TDVVOH Frank Ward      DOB: 1970-11-22      YWV:[id number]   Palliative Medicine Team at [facility] Progress Note  Subjective:  - continued conversation with patient regarding pain management strategies, rates his pain 10/10 "most of the time"  -continued  agreement for pain management strategy including incremental increases of  extended release MS Contin  and continuing his prn Dilaudid for breakthrough until efficacy (agrees to utilize oral agents first before IV)      -today increase MS Contin to 60 mg every 12 hrs      -encouraged to utilize Ativan prn  -he tells me his pain is "only controlled with the IV medications"  -again discussed  importance of including his PCP on discharge to manage pain medications  -discussed importance of including supports person in his plan of care    Filed Vitals:   01/10/14 0503  BP: 142/91  Pulse: 105  Temp: 98.1 F (36.7 C)  Resp: 16   Physical exam:  General: alert and oriented, NAD HEENT: moist buccal membranes, no exudate, wearing neck brace  CVS: tachycardic Lungs: CTA Ext: without edema Skin:  Warm and dry   Assessment and plan:  Cancer related pain/R hip down outer aspect of leg: - -increase MS Contin to 60 mg every 12 hrs - continue Dilaudid 4 mg po every 4 hrs prn breakthrough pain -continue Dilaudid 2 mg IV every 2 hrs prn -re-evaluate in am and make adjustments  Discussed with Dr Hartford Poli, he will make adjustment through the week-end with hopes of eliminating need for IV meds  Total time 60 minutes.  50% of time spent in counseling and coordination of care  Time in 1400 Time out [place] NP  Palliative Medicine Team Team Phone # [phone] Pager [phone]

## 2014-01-10 NOTE — Progress Notes (Signed)
[facility] PROGRESS NOTE   Frank Ward NWG:[id number] DOB: [birth year] DOA: 01/07/2014 PCP: No PCP Per Patient  HPI/Subjective: Seen while he was eating as breakfast, still complained of significant pain. Waiting for for his I-131 scan.  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 The patient has poor insight into his disease, and wants to be cured.  - He has severe pain related to his malignancy.  - Oncology consultation  - Will ask radiation oncology to speak with patient again  - Palliative care for assistance with pain management  - Check TSH  - Continue Synthroid  - Continue dexamethasone  - Per medical oncology, I-131 scan which is pending, to decide about ablative therapy.  Cancer related pain - Has metastases to his neck, balance and brain. Continue C-spine collar  - Patient is on OxyContin 30 mg, and this was switched to MS Contin 45 mg every 12 hours. - Continue oral Dilaudid 4 mg PO with IV for breakthrough  - Patient states the gabapentin, Lyrica have not helped and he does not want to restart them  - Patient has previously been on 150 mcg fentanyl patch which he no longer uses  - Start Robaxin   Lower extremity edema, concerning for lymphatic spread of his cancer  - Echocardiogram  - Urinalysis to check for proteinuria  - Vascular duplex lower extremities to rule out DVT  - Consider further abdominal imaging, but will defer to oncology   Hypertension, currently on no blood pressure medications  - Trend blood pressure   Anxiety  - Ativan scheduled and when necessary  - Consider trial of SSRI/SNRI   Normocytic anemia - Check folate,  ferritin, folate and iron/ferritin levels are normal, likely anemia of chronic disease.   Code Status: Full code Family Communication: Plan discussed with the patient. Disposition Plan: Remains inpatient   Consultants:  Medical oncology.  Radiation oncology  Procedures:  None  Antibiotics:  None   Objective: Filed Vitals:   01/10/14 0503  BP: 142/91  Pulse: 105  Temp: 98.1 F (36.7 C)  Resp: 16    Intake/Output Summary (Last 24 hours) at 01/10/14 1226 Last data filed at 01/10/14 0500  Gross per 24 hour  Intake    780 ml  Output      0 ml  Net    780 ml   Filed Weights   01/07/14 0856  Weight: 86.1 kg (189 lb 13.1 oz)    Exam: General: Alert and awake, oriented x3, agitated, anxious.  HEENT: anicteric sclera, pupils reactive to light and accommodation, EOMI CVS: S1-S2 clear, no murmur rubs or gallops Chest: clear to auscultation bilaterally, no wheezing, rales or rhonchi Abdomen: soft nontender, nondistended, normal bowel sounds, no organomegaly Extremities: no cyanosis, clubbing or edema noted bilaterally Neuro: Cranial nerves II-XII intact, no focal neurological deficits  Data Reviewed: Basic Metabolic Panel:  Recent Labs Lab 01/07/14 0420 01/08/14 0408  NA 141 140  K 4.2 4.4  CL 105 100  CO2 26 27  GLUCOSE 95 218*  BUN 12 14  CREATININE 1.00 0.93  CALCIUM 8.8 9.7   Liver Function Tests: No results found for this basename: AST, ALT, ALKPHOS, BILITOT,  PROT, ALBUMIN,  in the last 168 hours No results found for this basename: LIPASE, AMYLASE,  in the last 168 hours No results found for this basename: AMMONIA,  in the last 168 hours CBC:  Recent Labs Lab 01/07/14 0420 01/08/14 0408  WBC 4.5 7.5  NEUTROABS 3.2  --   HGB 10.9* 11.5*  HCT 33.9* 35.6*  MCV 97.1 97.3  PLT 282 316   Cardiac Enzymes: No results found for this basename: CKTOTAL, CKMB, CKMBINDEX, TROPONINI,  in the last 168 hours BNP (last 3 results) No results found for  this basename: PROBNP,  in the last 8760 hours CBG: No results found for this basename: GLUCAP,  in the last 168 hours  Micro No results found for this or any previous visit (from the past 240 hour(s)).   Studies: No results found.  Scheduled Meds: . dexamethasone  4 mg Oral TID  . docusate sodium  100 mg Oral BID  . enoxaparin (LOVENOX) injection  40 mg Subcutaneous Q24H  . [START ON 01/14/2014] levothyroxine  100 mcg Oral QAC breakfast  . LORazepam  1 mg Oral BID  . magnesium citrate  1 Bottle Oral Once  . methocarbamol  500 mg Oral TID  . morphine  45 mg Oral Q12H  . multivitamin with minerals  1 tablet Oral Daily  . pantoprazole  40 mg Oral Daily  . senna  2 tablet Oral QHS   Continuous Infusions:      Time spent: 35 minutes    Verlee Monte  [facility] Pager [phone] If 7PM-7AM, please contact night-coverage at [email or web address], password [facility] 01/10/2014, 12:26 PM  LOS: 3 days

## 2014-01-10 NOTE — Progress Notes (Addendum)
CSW attempted to follow up with pt.  Pt currently showering and nuclear medicine was entering pt room to see pt.   CSW had received phone call from pt this morning. Pt asking CSW if CSW was the person who prescribed medications. CSW explained that CSW was assisting pt with plan for discharge and is not the doctor. Pt realized whom he was now speaking to and asked CSW if CSW had contacted the [place]. CSW reminded pt that CSW and pt spoke to the [employer] yesterday together. Pt states that he received a phone call from Jarome Lamas with the housing authority and states that he provided permission to Jarome Lamas for CSW to speak to her. Pt asked CSW to contact Jarome Lamas and provided CSW with her contact information. CSW contacted Jarome Lamas via telephone and left message. CSW has not yet received return phone call.  CSW to attempt to follow up with pt for further discussion regarding discharge plan.  Addendum 4:30 pm:  CSW followed up with pt at bedside. Pt sitting on window seat upon CSW arrival.  CSW discussed with pt that CSW contacted Jarome Lamas per pt request and have not yet received return phone call.   CSW provided pt with information for websites to seek low-income housing.   CSW discussed with pt that [facility] is considering pt and is willing to come speak to pt on Monday about services they can offer. Pt discussed his hesitancy about ALF and having to sign over disability check while in ALF, but asked CSW to follow up on Monday and pt may consider speaking to ALF. Pt will likely not agree to ALF given pt does not want to lose his disability check.  CSW inquired with pt about where he planned to go upon discharge if he does not agree to ALF and pt states that he can stay with relatives, but does not specify which relative. When CSW attempted to get more detailed information pt shared that he would rather not discuss the specific, but wants CSW to know that  his family will not leave him "on the street". CSW explained to pt that CSW is inquiring about plan in order for medical team to assist with resources on the outpatient setting.   CSW will follow up on Monday to further discuss disposition plans with pt.   Alison Murray, MSW, [place] Work [phone]

## 2014-01-11 DIAGNOSIS — F29 Unspecified psychosis not due to a substance or known physiological condition: Secondary | ICD-10-CM

## 2014-01-11 LAB — CBC
HCT: 35 % — ABNORMAL LOW (ref 39.0–52.0)
HEMOGLOBIN: 11 g/dL — AB (ref 13.0–17.0)
MCH: 30.8 pg (ref 26.0–34.0)
MCHC: 31.4 g/dL (ref 30.0–36.0)
MCV: 98 fL (ref 78.0–100.0)
PLATELETS: 304 10*3/uL (ref 150–400)
RBC: 3.57 MIL/uL — ABNORMAL LOW (ref 4.22–5.81)
RDW: 15.7 % — ABNORMAL HIGH (ref 11.5–15.5)
WBC: 11.4 10*3/uL — ABNORMAL HIGH (ref 4.0–10.5)

## 2014-01-11 LAB — BASIC METABOLIC PANEL
BUN: 9 mg/dL (ref 6–23)
CO2: 27 mEq/L (ref 19–32)
Calcium: 9.3 mg/dL (ref 8.4–10.5)
Chloride: 103 mEq/L (ref 96–112)
Creatinine, Ser: 0.8 mg/dL (ref 0.50–1.35)
Glucose, Bld: 212 mg/dL — ABNORMAL HIGH (ref 70–99)
POTASSIUM: 4.4 meq/L (ref 3.7–5.3)
SODIUM: 141 meq/L (ref 137–147)

## 2014-01-11 MED ORDER — QUETIAPINE FUMARATE 50 MG PO TABS
50.0000 mg | ORAL_TABLET | Freq: Every day | ORAL | Status: DC
  Administered 2014-01-11 – 2014-01-21 (×11): 50 mg via ORAL
  Filled 2014-01-11 (×14): qty 1

## 2014-01-11 MED ORDER — LEVOTHYROXINE SODIUM 125 MCG PO TABS
125.0000 ug | ORAL_TABLET | Freq: Every day | ORAL | Status: DC
  Administered 2014-01-15 – 2014-01-19 (×5): 125 ug via ORAL
  Administered 2014-01-20: 100 ug via ORAL
  Administered 2014-01-20: 25 ug via ORAL
  Filled 2014-01-11 (×8): qty 1

## 2014-01-11 MED ORDER — QUETIAPINE FUMARATE 25 MG PO TABS
25.0000 mg | ORAL_TABLET | Freq: Two times a day (BID) | ORAL | Status: DC
  Administered 2014-01-11 – 2014-01-21 (×19): 25 mg via ORAL
  Filled 2014-01-11 (×25): qty 1

## 2014-01-11 NOTE — Consult Note (Signed)
[facility] Consult   Reason for Consult: Agitation, confusion Referring Physician: Dr.Elmahi Aaryav Lajuan Frank Ward is an 43 y.o. male. Total Time spent with patient: 1 hour  Assessment: AXIS I:  Psychotic Disorder NOS AXIS II:  Deferred AXIS III:   Past Medical History  Diagnosis Date  . Cancer     Thyroid  . Thyroid disease   . Hypertension   . Pneumonia    AXIS IV:  housing problems and problems related to social environment AXIS V:  41-50 serious symptoms  Plan:  Patient does not meet criteria for psychiatric inpatient admission.  Subjective:   Frank Ward is a 43 y.o. 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 He was admitted for primarily pain management.  The patient was seen twice today. On initial assessment he was very agitated and refused to speak to me. I returned an hour later. Was difficult for him to sit still. He seemed again agitated. He is very focused on his pain medicines and states he doesn't have time to talk to a psychiatrist. His thoughts are confused and disorganized. He seems somewhat paranoid about what is being done for him here. He denies auditory or visual sensations or any suicidal ideation he claims he wants to feel better and be able to work again.  I was able to contact his mother [name] at [phone]. She states that he does not have any psychiatric history. She noted he is become more confused agitated and paranoid since he's been diagnosed with thyroid cancer. She does not think is competent to make his own decisions either. When he sat the hospital he stays with her at friend's houses but doesn't have his own place to stay.  HPI:  See above HPI Elements:   Location:  global. Quality:  severe. Severity:  severe. Timing:  several months. Duration:  months. Context:  brain mets.  Past Psychiatric History: Past Medical History  Diagnosis Date  . Cancer      Thyroid  . Thyroid disease   . Hypertension   . Pneumonia     reports that he quit smoking about 2 years ago. His smoking use included Cigarettes. He smoked 0.00 packs per day for 10 years. He has never used smokeless tobacco. He reports that he drinks about 4.8 ounces of alcohol per week. He reports that he uses illicit drugs (Marijuana). Family History  Problem Relation Age of Onset  . Diabetes Mother   . Diabetes Brother      Living Arrangements: Parent (lives with mother)   Abuse/Neglect [facility]) Physical Abuse: Denies Verbal Abuse: Denies Sexual Abuse: Denies Allergies:   Allergies  Allergen Reactions  . No Known Allergies      Objective: Blood pressure 159/99, pulse 102, temperature 98.4 F (36.9 C), temperature source Oral, resp. rate 16, height '6\' 4"'  (1.93 m), weight 189 lb 13.1 oz (86.1 kg), SpO2 100.00%.Body mass index is 23.11 kg/(m^2). Results for orders placed during the hospital encounter of 01/07/14 (from the past 72 hour(s))  TSH     Status: Abnormal   Collection Time    01/10/14 11:46 AM      Result Value Ref Range   TSH 27.120 (*) 0.350 - 4.500 uIU/mL   Comment: Please note change in reference range.     Performed at [facility]     Status: Abnormal   Collection Time    01/11/14  5:16 AM  Result Value Ref Range   Sodium 141  137 - 147 mEq/L   Potassium 4.4  3.7 - 5.3 mEq/L   Chloride 103  96 - 112 mEq/L   CO2 27  19 - 32 mEq/L   Glucose, Bld 212 (*) 70 - 99 mg/dL   BUN 9  6 - 23 mg/dL   Creatinine, Ser 0.80  0.50 - 1.35 mg/dL   Calcium 9.3  8.4 - 10.5 mg/dL   GFR calc non Af Amer >90  >90 mL/min   GFR calc Af Amer >90  >90 mL/min   Comment: (NOTE)     The eGFR has been calculated using the CKD EPI equation.     This calculation has not been validated in all clinical situations.     eGFR's persistently <90 mL/min signify possible Chronic Kidney     Disease.  CBC     Status: Abnormal   Collection Time     01/11/14  5:16 AM      Result Value Ref Range   WBC 11.4 (*) 4.0 - 10.5 K/uL   RBC 3.57 (*) 4.22 - 5.81 MIL/uL   Hemoglobin 11.0 (*) 13.0 - 17.0 g/dL   HCT 35.0 (*) 39.0 - 52.0 %   MCV 98.0  78.0 - 100.0 fL   MCH 30.8  26.0 - 34.0 pg   MCHC 31.4  30.0 - 36.0 g/dL   RDW 15.7 (*) 11.5 - 15.5 %   Platelets 304  150 - 400 K/uL   Labs are reviewed and are pertinent for ongoing medical issues.  Current Facility-Administered Medications  Medication Dose Route Frequency Provider Last Rate Last Dose  . bisacodyl (DULCOLAX) suppository 10 mg  10 mg Rectal Daily PRN Janece Canterbury, MD      . dexamethasone (DECADRON) tablet 4 mg  4 mg Oral TID Janece Canterbury, MD   4 mg at 01/11/14 1004  . docusate sodium (COLACE) capsule 100 mg  100 mg Oral BID Janece Canterbury, MD   100 mg at 01/11/14 1004  . enoxaparin (LOVENOX) injection 40 mg  40 mg Subcutaneous Q24H Janece Canterbury, MD   40 mg at 01/11/14 1003  . HYDROmorphone (DILAUDID) injection 2 mg  2 mg Intravenous Q2H PRN Knox Royalty, NP   2 mg at 01/11/14 1003  . HYDROmorphone (DILAUDID) tablet 4 mg  4 mg Oral Q4H PRN Gardiner Barefoot, NP   4 mg at 01/11/14 0308  . [START ON 01/14/2014] levothyroxine (SYNTHROID, LEVOTHROID) tablet 125 mcg  125 mcg Oral QAC breakfast Verlee Monte, MD      . LORazepam (ATIVAN) tablet 1 mg  1 mg Oral BID Janece Canterbury, MD   1 mg at 01/11/14 1118  . LORazepam (ATIVAN) tablet 1 mg  1 mg Oral Q4H PRN Janece Canterbury, MD   1 mg at 01/11/14 1117  . magnesium citrate solution 1 Bottle  1 Bottle Oral Once Verlee Monte, MD      . methocarbamol (ROBAXIN) tablet 500 mg  500 mg Oral TID Janece Canterbury, MD   500 mg at 01/11/14 1005  . morphine (MS CONTIN) 12 hr tablet 60 mg  60 mg Oral Q12H Knox Royalty, NP   60 mg at 01/11/14 1117  . multivitamin with minerals tablet 1 tablet  1 tablet Oral Daily Janece Canterbury, MD   1 tablet at 01/10/14 1057  . ondansetron (ZOFRAN) tablet 4 mg  4 mg Oral Q6H PRN Janece Canterbury, MD  Or  . ondansetron (ZOFRAN) injection 4 mg  4 mg Intravenous Q6H PRN Janece Canterbury, MD      . pantoprazole (PROTONIX) EC tablet 40 mg  40 mg Oral Daily Janece Canterbury, MD   40 mg at 01/11/14 1004  . polyethylene glycol (MIRALAX / GLYCOLAX) packet 17 g  17 g Oral Daily PRN Janece Canterbury, MD   17 g at 01/11/14 1004  . senna (SENOKOT) tablet 17.2 mg  2 tablet Oral QHS Janece Canterbury, MD   17.2 mg at 01/10/14 2132    Psychiatric Specialty Exam:     Blood pressure 159/99, pulse 102, temperature 98.4 F (36.9 C), temperature source Oral, resp. rate 16, height '6\' 4"'  (1.93 m), weight 189 lb 13.1 oz (86.1 kg), SpO2 100.00%.Body mass index is 23.11 kg/(m^2).  General Appearance: Disheveled  Eye Contact::  Poor  Speech:  Pressured  Volume:  Increased  Mood:  Angry, Anxious and Irritable  Affect:  Labile  Thought Process:  Circumstantial, Disorganized and Loose  Orientation:  Full (Time, Place, and Person)  Thought Content:  Paranoid Ideation and Rumination  Suicidal Thoughts:  No  Homicidal Thoughts:  No  Memory:  Immediate;   Fair Recent;   Fair Remote;   Fair  Judgement:  Impaired  Insight:  Lacking  Psychomotor Activity:  Increased and Restlessness  Concentration:  Poor  Recall:  Poor  Fund of Knowledge:Fair  Language: Good  Akathisia:  No  Handed:  Right  AIMS (if indicated):     Assets:  Desire for Improvement  Sleep:      Musculoskeletal: Strength & Muscle Tone: within normal limits Gait & Station: normal Patient leans: N/A  Treatment Plan Summary: Daily contact with patient to assess and evaluate symptoms and progress in treatment Medication management Patient has been discussed with nursing staff. Nurse also reports that the patient has been paranoid angry confused and disorganized. This may be due to his brain metastases. 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 Seroquel will be initiated to help his symptoms of agitation poor sleep.  After speaking to his  mother is clear that he is not competent to manage his medical affairs and will need assistance in making decisions regarding medication treatment housing  Levonne Spiller 01/11/2014 12:11 PM

## 2014-01-11 NOTE — Progress Notes (Signed)
[facility] PROGRESS NOTE   Frank Ward HYQ:[id number] DOB: [birth year] DOA: 01/07/2014 PCP: No PCP Per Patient  HPI/Subjective: Seen while he is eating as breakfast this morning, pain is better controlled. Now on MS Contin 60 mg twice a day, continue oral and IV Dilaudid. Patient is showing a lot of anxiety, psych to evaluate.  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 The patient has poor insight into his disease, and wants to be cured.  - He has severe pain related to his malignancy.  - Oncology consultation  - Will ask radiation oncology to speak with patient again  - Palliative care for assistance with pain management  - TSH in the high side, I will increase her levothyroxine to suppress it. - Continue Synthroid  - Continue dexamethasone  - Per medical oncology, I-131 scan which is pending, to decide about ablative therapy.  Cancer related pain - Has metastases to his neck, balance and brain. Continue C-spine collar  - Patient is on OxyContin 30 mg, and this was switched to MS Contin 45 mg every 12 hours. - Continue oral Dilaudid 4 mg PO with IV for breakthrough  - Patient states the gabapentin, Lyrica have not helped and he does not want to restart them  - Patient has previously been on 150 mcg fentanyl patch which he no longer uses  - MS Contin increased to 60 mg every 12 hours.   Lower extremity edema, concerning for lymphatic spread of his cancer  - Echocardiogram  - Urinalysis to check for proteinuria  - Vascular duplex lower extremities to rule out DVT  - Consider further abdominal imaging, but will defer to oncology   Hypertension, currently  on no blood pressure medications  - Trend blood pressure   Anxiety  - Ativan scheduled and when necessary  - Consider trial of SSRI/SNRI   Normocytic anemia - Check folate, ferritin, folate and iron/ferritin levels are normal, likely anemia of chronic disease.   Code Status: Full code Family Communication: Plan discussed with the patient. Disposition Plan: Remains inpatient   Consultants:  Medical oncology.  Radiation oncology  Procedures:  None  Antibiotics:  None   Objective: Filed Vitals:   01/11/14 0754  BP: 159/99  Pulse: 102  Temp: 98.4 F (36.9 C)  Resp: 16    Intake/Output Summary (Last 24 hours) at 01/11/14 1143 Last data filed at 01/11/14 0900  Gross per 24 hour  Intake    940 ml  Output      0 ml  Net    940 ml   Filed Weights   01/07/14 0856  Weight: 86.1 kg (189 lb 13.1 oz)    Exam: General: Alert and awake, oriented x3, agitated, anxious.  HEENT: anicteric sclera, pupils reactive to light and accommodation, EOMI CVS: S1-S2 clear, no murmur rubs or gallops Chest: clear to auscultation bilaterally, no wheezing, rales or rhonchi Abdomen: soft nontender, nondistended, normal bowel sounds, no organomegaly Extremities: no cyanosis, clubbing or edema noted bilaterally Neuro: Cranial nerves II-XII intact, no focal neurological deficits  Data Reviewed: Basic Metabolic Panel:  Recent Labs Lab 01/07/14 0420 01/08/14 0408 01/11/14 0516  NA 141 140 141  K 4.2 4.4  4.4  CL 105 100 103  CO2 26 27 27   GLUCOSE 95 218* 212*  BUN 12 14 9   CREATININE 1.00 0.93 0.80  CALCIUM 8.8 9.7 9.3   Liver Function Tests: No results found for this basename: AST, ALT, ALKPHOS, BILITOT, PROT, ALBUMIN,  in the last 168 hours No results found for this basename: LIPASE, AMYLASE,  in the last 168 hours No results found for this basename: AMMONIA,  in the last 168 hours CBC:  Recent Labs Lab 01/07/14 0420 01/08/14 0408 01/11/14 0516  WBC 4.5 7.5 11.4*    NEUTROABS 3.2  --   --   HGB 10.9* 11.5* 11.0*  HCT 33.9* 35.6* 35.0*  MCV 97.1 97.3 98.0  PLT 282 316 304   Cardiac Enzymes: No results found for this basename: CKTOTAL, CKMB, CKMBINDEX, TROPONINI,  in the last 168 hours BNP (last 3 results) No results found for this basename: PROBNP,  in the last 8760 hours CBG: No results found for this basename: GLUCAP,  in the last 168 hours  Micro No results found for this or any previous visit (from the past 240 hour(s)).   Studies: No results found.  Scheduled Meds: . dexamethasone  4 mg Oral TID  . docusate sodium  100 mg Oral BID  . enoxaparin (LOVENOX) injection  40 mg Subcutaneous Q24H  . [START ON 01/14/2014] levothyroxine  100 mcg Oral QAC breakfast  . LORazepam  1 mg Oral BID  . magnesium citrate  1 Bottle Oral Once  . methocarbamol  500 mg Oral TID  . morphine  60 mg Oral Q12H  . multivitamin with minerals  1 tablet Oral Daily  . pantoprazole  40 mg Oral Daily  . senna  2 tablet Oral QHS   Continuous Infusions:      Time spent: 35 minutes    Verlee Monte  [facility] Pager [phone] If 7PM-7AM, please contact night-coverage at [email or web address], password [facility] 01/11/2014, 11:43 AM  LOS: 4 days

## 2014-01-12 MED ORDER — HYDROMORPHONE HCL PF 2 MG/ML IJ SOLN
2.0000 mg | Freq: Four times a day (QID) | INTRAMUSCULAR | Status: DC | PRN
  Administered 2014-01-12 – 2014-01-17 (×14): 2 mg via INTRAVENOUS
  Filled 2014-01-12 (×17): qty 1

## 2014-01-12 NOTE — Progress Notes (Signed)
Pt asked for IV dilaudid rate pain score 10/10.  Attempted to administered IV, found pt did not have IV access. Pt stated "I forgot to tell you I pulled IV out".  This RN attempted one unsuccessful IV stick. Lajuana Carry, RN successfully placed IV in pt. Pt received PRN IV dilaudid and fell asleep. Later during rounding pt found in room awake without IV in arm.  He stated "I was in bed asleep and I guess I pulled IV out." This RN explain the importance of having IV access and also explained without IV access he will not be able to get IV dilaudid. On call provider notified and called back.  No new orders at this time. Pt does not have IV access.  Pt has PO dilaudid  as an optional for pain management.

## 2014-01-12 NOTE — Progress Notes (Signed)
Patient has not asked for any pain medication since 1030. I asked Cipriano if he needed pain med and he said he would take "whatever pill was due next" Robaxin scheduled for 1600 and dilauded po able to be given. Gave patient robaxin tab and dilauded tab but he did not take them. When asked to take pills pt became angry. Stated he would take them later. Threw pills in sink and left room. Pills wasted in sharps container.Charlott Calvario Mallie Snooks

## 2014-01-12 NOTE — Progress Notes (Signed)
[facility] PROGRESS NOTE   Frank Ward [id number]:[id number] DOB: Jan 19, 1971 DOA: 01/07/2014 PCP: No PCP Per Patient  HPI/Subjective: Seen while he is eating as breakfast this morning, pain is minimal. Said he was confused last night, he pulled his IV access twice. Iodine scan will be imaged tomorrow.  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 The patient has poor insight into his disease, and wants to be cured.  - He has severe pain related to his malignancy.  - Oncology consultation  - Radiation oncology said he is not a candidate for radiosurgery or neurosurgery. - Palliative care for assistance with pain management  - TSH in the high side, I will increase her levothyroxine to suppress it. - Continue dexamethasone  - Per medical oncology, I-131 scan which is pending, to decide about ablative therapy.  Cancer related pain - Has metastases to his neck, balance and brain. Continue C-spine collar  - Patient is on OxyContin 30 mg, and this was switched to MS Contin 45 mg every 12 hours. - Continue oral Dilaudid 4 mg PO with IV for breakthrough  - Patient states the gabapentin, Lyrica have not helped and he does not want to restart them  - Patient has previously been on 150 mcg fentanyl patch which he no longer uses  - MS Contin increased to 60 mg every 12 hours.  - Patient had a lot of behavioral issues regarding his pain medications, he does not want to try oral Dilaudid. - Patient is asking always about IV medication prior to oral once, explained to him more than once he needs to try oral medication to prepare him for discharge.  Lower extremity edema, concerning  for lymphatic spread of his cancer  - Echocardiogram  - Urinalysis to check for proteinuria  - Vascular duplex lower extremities to rule out DVT  - Consider further abdominal imaging, but will defer to oncology   Hypertension, currently on no blood pressure medications  - Trend blood pressure   Anxiety  - Patient is on Ativan. - Seen by psych on 5/30 and started Seroquel each bedtime.  Normocytic anemia - Check folate, ferritin, folate and iron/ferritin levels are normal, likely anemia of chronic disease.   Code Status: Full code Family Communication: Plan discussed with the patient. Disposition Plan: Remains inpatient   Consultants:  Medical oncology.  Radiation oncology  Procedures:  None  Antibiotics:  None   Objective: Filed Vitals:   01/12/14 0539  BP: 143/93  Pulse: 100  Temp: 97.3 F (36.3 C)  Resp: 18    Intake/Output Summary (Last 24 hours) at 01/12/14 1057 Last data filed at 01/12/14 0540  Gross per 24 hour  Intake   1160 ml  Output      0 ml  Net   1160 ml   Filed Weights   01/07/14 0856  Weight: 86.1 kg (189 lb 13.1 oz)    Exam: General: Alert and awake, oriented x3, agitated, anxious.  HEENT: anicteric sclera, pupils reactive to light and accommodation, EOMI CVS: S1-S2 clear, no murmur rubs or gallops Chest: clear to auscultation bilaterally, no wheezing, rales or rhonchi Abdomen: soft nontender, nondistended, normal bowel sounds, no organomegaly Extremities:  no cyanosis, clubbing or edema noted bilaterally Neuro: Cranial nerves II-XII intact, no focal neurological deficits  Data Reviewed: Basic Metabolic Panel:  Recent Labs Lab 01/07/14 0420 01/08/14 0408 01/11/14 0516  NA 141 140 141  K 4.2 4.4 4.4  CL 105 100 103  CO2 26 27 27   GLUCOSE 95 218* 212*  BUN 12 14 9   CREATININE 1.00 0.93 0.80  CALCIUM 8.8 9.7 9.3   Liver Function Tests: No results found for this basename: AST, ALT, ALKPHOS, BILITOT, PROT, ALBUMIN,  in  the last 168 hours No results found for this basename: LIPASE, AMYLASE,  in the last 168 hours No results found for this basename: AMMONIA,  in the last 168 hours CBC:  Recent Labs Lab 01/07/14 0420 01/08/14 0408 01/11/14 0516  WBC 4.5 7.5 11.4*  NEUTROABS 3.2  --   --   HGB 10.9* 11.5* 11.0*  HCT 33.9* 35.6* 35.0*  MCV 97.1 97.3 98.0  PLT 282 316 304   Cardiac Enzymes: No results found for this basename: CKTOTAL, CKMB, CKMBINDEX, TROPONINI,  in the last 168 hours BNP (last 3 results) No results found for this basename: PROBNP,  in the last 8760 hours CBG: No results found for this basename: GLUCAP,  in the last 168 hours  Micro No results found for this or any previous visit (from the past 240 hour(s)).   Studies: No results found.  Scheduled Meds: . dexamethasone  4 mg Oral TID  . docusate sodium  100 mg Oral BID  . enoxaparin (LOVENOX) injection  40 mg Subcutaneous Q24H  . [START ON 01/14/2014] levothyroxine  125 mcg Oral QAC breakfast  . LORazepam  1 mg Oral BID  . magnesium citrate  1 Bottle Oral Once  . methocarbamol  500 mg Oral TID  . morphine  60 mg Oral Q12H  . multivitamin with minerals  1 tablet Oral Daily  . pantoprazole  40 mg Oral Daily  . QUEtiapine  25 mg Oral BID  . QUEtiapine  50 mg Oral QHS  . senna  2 tablet Oral QHS   Continuous Infusions:      Time spent: 35 minutes    Verlee Monte  [facility] Pager [phone] If 7PM-7AM, please contact night-coverage at [email or web address], password [facility] 01/12/2014, 10:57 AM  LOS: 5 days

## 2014-01-13 ENCOUNTER — Inpatient Hospital Stay (HOSPITAL_COMMUNITY): Payer: Medicaid Other

## 2014-01-13 MED ORDER — SODIUM IODIDE I 131 CAPSULE: 3.2000 | Freq: Once | INTRAVENOUS | Status: AC | PRN

## 2014-01-13 NOTE — Progress Notes (Signed)
CSW continuing to follow for disposition planning.  CSW reviewed chart and noted psychiatrist saw pt 5/30 and psychiatrist note states, "After speaking to his mother is clear that he is not competent to manage his medical affairs and will need assistance in making decisions regarding medication treatment housing". CSW discussed with attending MD who stated that his impression was that the psychiatrist did not weigh in on capacity and was stating the mother's impression of pt from psychiatrist conversation with the mother. CSW notified MD that MD would need to re-consult psych for determination of capacity in order for pt disposition plan to be appropriately managed.  CSW and RNCM met with pt at bedside to discuss. CSW discussed ALF placement with pt and pt is agreeable to [facility] coming to the hospital to discuss ALF with pt. [facility] is the only facility considering pt from ALF search and will have to do face to face evaluation in order to determine if they can accept pt. Pt states that he is still unsure if he will be agreeable to ALF placement as he is hesitant about signing over his disability check to the facility in order for pt needs to be paid at the facility.  CSW and RNCM discussed with pt the importance of having a secondary plan in place if [facility] ALF if unable to offer pt a bed. Pt continues to state that he will be able to stay with his mother or another family member, but declines RNCM offer to contact pt mother to discuss. CSW encouraged pt to begin contacting friends and family members to develop a plan for discharge. Pt did agree to RNCM contacted pt HCPOA, Pauline Chapman.   CSW contacted [facility] and spoke with facilities RN. [facility] RN, facility will send someone out tomorrow to speak with pt and complete face to face evaluation to determine if facility is able to offer pt a  bed.  CSW to continue to follow.   Suzanna Kidd, MSW, LCSW Clinical Social Work [phone]  

## 2014-01-13 NOTE — Progress Notes (Signed)
01/13/14 1500  Clinical Encounter Type  Visited With Patient  Visit Type Follow-up  Referral From (Palliative Care Rounding list)   Visited with Frank Ward to offer spiritual and emotional support.  He named several times that he was "very tired" and not sleeping much, sometimes being awake for days at a time.  He expressed that family is a big source of meaning for him, that he has no children of his own (two miscarriages), and that he is a great father figure for his nieces and nephews.  Reminded him that [facility] is available as he desires.  Please page as needs arise.  Thank you.  [place], [place]

## 2014-01-13 NOTE — Progress Notes (Signed)
Pt asked for pain medication.  Entered room with PRN and schedule evening medication. PRN IV dilaudid was not administered when initially scanned. Pt questioned and refused schedule evening medication.  Later pt complained of pain and asked for remaining medication in addition to PRN PO dilaudid.  Pt stated  "give me anything, I do not care if it's 9 or 10 pills." Schedule and PRN medication administer and times adjusted reflexed on MAR.

## 2014-01-13 NOTE — Consult Note (Signed)
[facility] Face-to-Face Psychiatry Consult   Reason for Consult: Confusion and competency evaluation Referring Physician: Dr.Elmahi Frank Ward is an 43 y.o. male. Total Time spent with patient: 1 hour  Assessment: AXIS I:  Psychotic Disorder NOS AXIS II:  Deferred AXIS III:   Past Medical History  Diagnosis Date  . Cancer     Thyroid  . Thyroid disease   . Hypertension   . Pneumonia    AXIS IV:  housing problems and problems related to social environment AXIS V:  41-50 serious symptoms  Plan:  Discussed with the doctor Elmahi. Based on my evaluation patient does not meet capacity to make his own medical decisions and living arrangements. Patient is in agreement regarding assisted living facility placement if needed.  Continue his current medication management  Contact psychiatric social services regarding medical care power of attorney Appreciate psychiatric consultation and we'll sign off at this time May contact [phone] if needed further assistance Patient does not meet criteria for psychiatric inpatient admission.  Subjective:   Frank Ward is a 43 y.o. 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 Psychiatric consultation requested for capacity evaluation and possible medication management for agitation. Reviewed previous consultation note Completed by Dr. Harrington Challenger, and information obtained face-to-face with the patient and case discussed with Dr. Hartford Poli.  Patient appeared eating his lunch without distress and cough uncooperative during this evaluation. Patient stated he has been staying with his mother and previously with his sister patient denies history of possible mental illness or family mental illness. He  stated he has questions but unable to formulate to ask. Patient asked do you know how many nieces I have? And than he stated that this provider is his cousin. When I have re introduced myself,  patient stated he was confused, forgetful and does not know who to trust because different provider is coming and telling him different things. Patient stated he does not like her from any doctor that he does not have much time to live. His thoughts are confused, disorganized and paranoid.  patient was asked information about his mother, patient given name of the mother but refused to give the phone number saying he needed to ask his mother first. He denies  psychosis or suicidal ideation/homicidal ideation. Patient claims he wants to feel better and be able to work again. Reportedly he was working until 6 months ago in [employer] and stated he has diploma in high school. Patient knows his name, name of the hospital, month but confused about a year and stated 2013.   HPI Elements:  Location:  global. Quality:  severe. Severity:  severe. Timing:  several months. Duration:  months. Context:  brain mets.  Past Psychiatric History: Past Medical History  Diagnosis Date  . Cancer     Thyroid  . Thyroid disease   . Hypertension   . Pneumonia     reports that he quit smoking about 2 years ago. His smoking use included Cigarettes. He smoked 0.00 packs per day for 10 years. He has never used smokeless tobacco. He reports that he drinks about 4.8 ounces of alcohol per week. He reports that he uses illicit drugs (Marijuana). Family History  Problem Relation Age of Onset  . Diabetes Mother   . Diabetes Brother      Living Arrangements: Parent (lives with mother)   Abuse/Neglect [facility]) Physical Abuse: Denies Verbal Abuse: Denies Sexual Abuse: Denies Allergies:   Allergies  Allergen  Reactions  . No Known Allergies      Objective: Blood pressure 138/99, pulse 111, temperature 98.2 F (36.8 C), temperature source Oral, resp. rate 20, height _0  (1.93 m), weight 86.1 kg (189 lb 13.1 oz), SpO2 100.00%.Body mass index is 23.11 kg/(m^2). Results for orders placed during the hospital  encounter of 01/07/14 (from the past 72 hour(s))  BASIC METABOLIC PANEL     Status: Abnormal   Collection Time    01/11/14  5:16 AM      Result Value Ref Range   Sodium 141  137 - 147 mEq/L   Potassium 4.4  3.7 - 5.3 mEq/L   Chloride 103  96 - 112 mEq/L   CO2 27  19 - 32 mEq/L   Glucose, Bld 212 (*) 70 - 99 mg/dL   BUN 9  6 - 23 mg/dL   Creatinine, Ser 0.80  0.50 - 1.35 mg/dL   Calcium 9.3  8.4 - 10.5 mg/dL   GFR calc non Af Amer >90  >90 mL/min   GFR calc Af Amer >90  >90 mL/min   Comment: (NOTE)     The eGFR has been calculated using the CKD EPI equation.     This calculation has not been validated in all clinical situations.     eGFR's persistently <90 mL/min signify possible Chronic Kidney     Disease.  CBC     Status: Abnormal   Collection Time    01/11/14  5:16 AM      Result Value Ref Range   WBC 11.4 (*) 4.0 - 10.5 K/uL   RBC 3.57 (*) 4.22 - 5.81 MIL/uL   Hemoglobin 11.0 (*) 13.0 - 17.0 g/dL   HCT 35.0 (*) 39.0 - 52.0 %   MCV 98.0  78.0 - 100.0 fL   MCH 30.8  26.0 - 34.0 pg   MCHC 31.4  30.0 - 36.0 g/dL   RDW 15.7 (*) 11.5 - 15.5 %   Platelets 304  150 - 400 K/uL   Labs are reviewed and are pertinent for ongoing medical issues.  Current Facility-Administered Medications  Medication Dose Route Frequency Provider Last Rate Last Dose  . bisacodyl (DULCOLAX) suppository 10 mg  10 mg Rectal Daily PRN Janece Canterbury, MD      . dexamethasone (DECADRON) tablet 4 mg  4 mg Oral TID Janece Canterbury, MD   4 mg at 01/13/14 1056  . docusate sodium (COLACE) capsule 100 mg  100 mg Oral BID Janece Canterbury, MD   100 mg at 01/13/14 1056  . enoxaparin (LOVENOX) injection 40 mg  40 mg Subcutaneous Q24H Janece Canterbury, MD   40 mg at 01/13/14 1057  . HYDROmorphone (DILAUDID) injection 2 mg  2 mg Intravenous Q6H PRN Verlee Monte, MD   2 mg at 01/13/14 1058  . HYDROmorphone (DILAUDID) tablet 4 mg  4 mg Oral Q4H PRN Gardiner Barefoot, NP   4 mg at 01/13/14 1275  . [START ON 01/14/2014]  levothyroxine (SYNTHROID, LEVOTHROID) tablet 125 mcg  125 mcg Oral QAC breakfast Verlee Monte, MD      . LORazepam (ATIVAN) tablet 1 mg  1 mg Oral BID Janece Canterbury, MD   1 mg at 01/13/14 1056  . LORazepam (ATIVAN) tablet 1 mg  1 mg Oral Q4H PRN Janece Canterbury, MD   1 mg at 01/12/14 1731  . magnesium citrate solution 1 Bottle  1 Bottle Oral Once Verlee Monte, MD      . methocarbamol (ROBAXIN) tablet  500 mg  500 mg Oral TID Janece Canterbury, MD   500 mg at 01/13/14 1054  . morphine (MS CONTIN) 12 hr tablet 60 mg  60 mg Oral Q12H Knox Royalty, NP   60 mg at 01/13/14 1054  . multivitamin with minerals tablet 1 tablet  1 tablet Oral Daily Janece Canterbury, MD   1 tablet at 01/13/14 1055  . ondansetron (ZOFRAN) tablet 4 mg  4 mg Oral Q6H PRN Janece Canterbury, MD       Or  . ondansetron (ZOFRAN) injection 4 mg  4 mg Intravenous Q6H PRN Janece Canterbury, MD      . pantoprazole (PROTONIX) EC tablet 40 mg  40 mg Oral Daily Janece Canterbury, MD   40 mg at 01/13/14 1056  . polyethylene glycol (MIRALAX / GLYCOLAX) packet 17 g  17 g Oral Daily PRN Janece Canterbury, MD   17 g at 01/11/14 1004  . QUEtiapine (SEROQUEL) tablet 25 mg  25 mg Oral BID Levonne Spiller, MD   25 mg at 01/13/14 1114  . QUEtiapine (SEROQUEL) tablet 50 mg  50 mg Oral QHS Levonne Spiller, MD   50 mg at 01/13/14 0048  . senna (SENOKOT) tablet 17.2 mg  2 tablet Oral QHS Janece Canterbury, MD   17.2 mg at 01/13/14 0048  . sodium iodide (I-131) capsule 3.2 milli Curie  3.2 milli Curie Oral Once PRN Medication Radiologist, MD        Psychiatric Specialty Exam:     Blood pressure 138/99, pulse 111, temperature 98.2 F (36.8 C), temperature source Oral, resp. rate 20, height _0  (1.93 m), weight 86.1 kg (189 lb 13.1 oz), SpO2 100.00%.Body mass index is 23.11 kg/(m^2).  General Appearance: Disheveled  Eye Contact::  Poor  Speech:  Pressured  Volume:  Increased  Mood:  Angry, Anxious and Irritable  Affect:  Labile  Thought Process:   Circumstantial, Disorganized and Loose  Orientation:  Full (Time, Place, and Person)  Thought Content:  Paranoid Ideation and Rumination  Suicidal Thoughts:  No  Homicidal Thoughts:  No  Memory:  Immediate;   Fair Recent;   Fair Remote;   Fair  Judgement:  Impaired  Insight:  Lacking  Psychomotor Activity:  Increased and Restlessness  Concentration:  Poor  Recall:  Poor  Fund of Knowledge:Fair  Language: Good  Akathisia:  No  Handed:  Right  AIMS (if indicated):     Assets:  Desire for Improvement  Sleep:      Musculoskeletal: Strength & Muscle Tone: within normal limits Gait & Station: normal Patient leans: N/A  Treatment Plan Summary: Daily contact with patient to assess and evaluate symptoms and progress in treatment Medication management  Frank Ward 01/13/2014 2:14 PM

## 2014-01-13 NOTE — Progress Notes (Signed)
Transporter came to take pt to Radiation, pt states he was not ready. States he had to eat first because he was diabetic and that he needed to get dressed. Pt is not diabetic and he was fully dressed in his own clothes with his neck brace on. Transporter left. Pt ate and was then ready to go to radiation, transporter was not ready to come get him yet and the pt became very upset.

## 2014-01-13 NOTE — Progress Notes (Addendum)
[facility] PROGRESS NOTE   Frank Ward HUD:[id number] DOB: 10/04/1970 DOA: 01/07/2014 PCP: No PCP Per Patient  HPI/Subjective: Patient dressed up, he was worried about his breakfast this morning. According to the RN, refused to go down to radiology until he eats.  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 The patient has poor insight into his disease, and wants to be cured.  - He has severe pain related to his malignancy.  - Oncology consultation  - Radiation oncology said he is not a candidate for radiosurgery or neurosurgery. - Palliative care for assistance with pain management  - TSH in the high side, I will increase her levothyroxine to suppress it. - Continue dexamethasone  - Per medical oncology, I-131 scan which is pending, to decide about ablative therapy.  Behavioral issues -Patient had multiple behavioral issues all centered around his pain medications. -Patient exhibited anxiety, paranoia and flight of ideas. -Per discussion with his brother, he does have long history of same symptoms, but never seen psychiatrist to be diagnosed. -Psych evaluated the patient, placed on Seroquel at night.  Cancer related pain - Has metastases to his neck, balance and brain. Continue C-spine collar  - Patient is on OxyContin 30 mg, and this was switched to MS Contin 45 mg every 12 hours. - Continue oral Dilaudid 4 mg PO with IV for breakthrough  - Patient states the gabapentin, Lyrica have not helped and he does not want to restart them  - Patient has previously been on 150 mcg fentanyl patch which he no longer uses  - MS Contin increased to 60 mg every 12 hours.    - Patient had a lot of behavioral issues regarding his pain medications, he does not want to try oral Dilaudid. - Patient is asking always about IV medication prior to oral once, explained to him more than once he needs to try oral medication to prepare him for discharge.  Lower extremity edema, concerning for lymphatic spread of his cancer or secondary to his low albumin. - Echocardiogram did not show evidence of CHF - Vascular duplex was negative for DVT.  Hypertension, currently on no blood pressure medications  - Trend blood pressure   Anxiety  - Patient is on Ativan. - Seen by psych on 5/30 and started Seroquel each bedtime.  Normocytic anemia - Check folate, ferritin, folate and iron/ferritin levels are normal, likely anemia of chronic disease.   Code Status: Full code Family Communication: Plan discussed with the patient. Disposition Plan: Remains inpatient, planning for assisted living when he is medically ready.   Consultants:  Medical oncology.  Radiation oncology  Procedures:  None  Antibiotics:  None   Objective: Filed Vitals:   01/13/14 0556  BP: 138/99  Pulse: 111  Temp: 98.2 F (36.8 C)  Resp: 20    Intake/Output Summary (Last 24 hours) at 01/13/14 1026 Last data filed at 01/13/14 1002  Gross per 24 hour  Intake   1700 ml  Output      0 ml  Net   1700 ml   Filed Weights   01/07/14 0856  Weight: 86.1 kg (189 lb 13.1 oz)    Exam: General: Alert and  awake, oriented x3, agitated, anxious.  HEENT: anicteric sclera, pupils reactive to light and accommodation, EOMI CVS: S1-S2 clear, no murmur rubs or gallops Chest: clear to auscultation bilaterally, no wheezing, rales or rhonchi Abdomen: soft nontender, nondistended, normal bowel sounds, no organomegaly Extremities: no cyanosis, clubbing or edema noted bilaterally Neuro: Cranial nerves II-XII intact, no focal neurological deficits  Data Reviewed: Basic Metabolic Panel:  Recent  Labs Lab 01/07/14 0420 01/08/14 0408 01/11/14 0516  NA 141 140 141  K 4.2 4.4 4.4  CL 105 100 103  CO2 26 27 27   GLUCOSE 95 218* 212*  BUN 12 14 9   CREATININE 1.00 0.93 0.80  CALCIUM 8.8 9.7 9.3   Liver Function Tests: No results found for this basename: AST, ALT, ALKPHOS, BILITOT, PROT, ALBUMIN,  in the last 168 hours No results found for this basename: LIPASE, AMYLASE,  in the last 168 hours No results found for this basename: AMMONIA,  in the last 168 hours CBC:  Recent Labs Lab 01/07/14 0420 01/08/14 0408 01/11/14 0516  WBC 4.5 7.5 11.4*  NEUTROABS 3.2  --   --   HGB 10.9* 11.5* 11.0*  HCT 33.9* 35.6* 35.0*  MCV 97.1 97.3 98.0  PLT 282 316 304   Cardiac Enzymes: No results found for this basename: CKTOTAL, CKMB, CKMBINDEX, TROPONINI,  in the last 168 hours BNP (last 3 results) No results found for this basename: PROBNP,  in the last 8760 hours CBG: No results found for this basename: GLUCAP,  in the last 168 hours  Micro No results found for this or any previous visit (from the past 240 hour(s)).   Studies: No results found.  Scheduled Meds: . dexamethasone  4 mg Oral TID  . docusate sodium  100 mg Oral BID  . enoxaparin (LOVENOX) injection  40 mg Subcutaneous Q24H  . [START ON 01/14/2014] levothyroxine  125 mcg Oral QAC breakfast  . LORazepam  1 mg Oral BID  . magnesium citrate  1 Bottle Oral Once  . methocarbamol  500 mg Oral TID  . morphine  60 mg Oral Q12H  . multivitamin with minerals  1 tablet Oral Daily  . pantoprazole  40 mg Oral Daily  . QUEtiapine  25 mg Oral BID  . QUEtiapine  50 mg Oral QHS  . senna  2 tablet Oral QHS   Continuous Infusions:      Time spent: 35 minutes    Verlee Monte  [facility] Pager [phone] If 7PM-7AM, please contact night-coverage at [email or web address], password [facility] 01/13/2014, 10:26 AM  LOS: 6 days

## 2014-01-14 ENCOUNTER — Telehealth: Payer: Self-pay | Admitting: Oncology

## 2014-01-14 ENCOUNTER — Other Ambulatory Visit: Payer: Self-pay | Admitting: Oncology

## 2014-01-14 ENCOUNTER — Telehealth (HOSPITAL_COMMUNITY): Payer: Self-pay | Admitting: *Deleted

## 2014-01-14 DIAGNOSIS — R5381 Other malaise: Secondary | ICD-10-CM

## 2014-01-14 DIAGNOSIS — C7800 Secondary malignant neoplasm of unspecified lung: Secondary | ICD-10-CM

## 2014-01-14 DIAGNOSIS — R5383 Other fatigue: Secondary | ICD-10-CM

## 2014-01-14 MED ORDER — FUROSEMIDE 10 MG/ML IJ SOLN
40.0000 mg | Freq: Once | INTRAMUSCULAR | Status: DC
  Filled 2014-01-14 (×2): qty 4

## 2014-01-14 MED ORDER — HALOPERIDOL 1 MG PO TABS
1.0000 mg | ORAL_TABLET | Freq: Three times a day (TID) | ORAL | Status: DC
  Administered 2014-01-15 – 2014-01-21 (×18): 1 mg via ORAL
  Filled 2014-01-14 (×28): qty 1

## 2014-01-14 NOTE — Progress Notes (Addendum)
CSW continuing to follow for disposition planning.   CSW reviewed chart and noted that psych MD deemed that pt does not have capacity to make decisions at this time.  CSW contacted pt HCPOA, Lucious Groves [phone]) and left voice message.  CSW spoke with [facility] who reports that facility representative will come to hospital around lunch to evaluate pt and see if [facility] is able to offer pt a bed.  CSW attempting to reach pt HCPOA to discuss secondary options of what family member would be able to assist pt upon discharge if ALF is unable to offer pt a bed.   CSW attempting to update pt pasarr via [facility]. Pt has a previous pasarr, but with psychiatry diagnosing pt with Psychotic Disorder NOS, pasarr must be updated. [facility] is currently unavailable and CSW unable to re-submit pt ALF pasarr until system restored.  CSW to follow up with pt HCPOA regarding disposition plan.  Addendum 1:00 pm:  CSW received phone call from [facility] requesting further information regarding pt psychiatric history. CSW explained that pt has been seen by psych and requested CSW to send progress notes from psychiatrist. Clinicals sent. [facility] plans to review clinicals in order to determine if facility can meet pt needs particularly pt psychiatric needs.   CSW continues to be unable to update pt pasarr as [place] [facility] system is still unavailable.  CSW has not yet received return phone call from pt HCPOA, Lucious Groves. CSW to re-attempt to reach pt HCPOA.   CSW to continue to follow.   Addendum 1:40 pm:   Second phone call attempt made to pt HCPOA, Lucious Groves without success, message left.   Addendum 2:00 pm:  CSW received return phone call from pt HCPOA, Lucious Groves. Pt HCPOA discussed that they are in the process of moving. CSW explained that pt completed advanced directive in January and chose for Mrs. Freda Munro to  be HCPOA. CSW notified pt HCPOA that pt was deemed not to have capacity and unable to make decisions at this time.  CSW explained that CSW is assisting with disposition plan. CSW explained that CSW is attempting to place pt in an ALF, but CSW is unsure that ALF will be able to accept pt. CSW inquired with pt HCPOA if pt HCPOA has had conversations with pt family about who pt may be able to stay with following discharge. Pt HCPOA explained that many family members are older than pt and unable to care for pt. Pt HCPOA stated that she has been in the process of moving and will be back to her home in 15 to 20 minutes and will re-contact this CSW at that time to further discuss.   CSW awaiting return phone call from [facility] to further discuss.   Addendum 2:54 pm:  CSW was able to re-submit pt pasarr and pasarr sent to pasarr manual review by RN. CSW to fax clinicals for review.  CSW to continue to follow.  Addendum 3:33 pm:  CSW faxed additional clinicals to [facility] for pasarr review.  CSW received return phone call from pt HCPOA, Lucious Groves stating that she had not yet arrived home, but will arrive home before CSW leaves for the day and will contact CSW as soon as she arrives home to further discuss plans.   CSW to continue to follow.   Alison Murray, MSW, [facility] [employer] [phone]

## 2014-01-14 NOTE — Progress Notes (Signed)
Earlier in day lab at patients door for ordered lab draw. Patient became agitated and I received call from secretary to assess situation. I stood within door frame of patient's room. Patient became more agitated. Before I could explain need for lab draw patient started yelling that he was scared of me, yelling help. Patient called 911 with his cell phone reported that I was trying to harm him. I stood at door until Therapist, sports, Jena Gauss, RN came to room. 2 Science writer on floor out side of patient's room as well. Staff was able to deescalate patient's agitation after approximately 30 minutes. Patient agreed to take pain medication from [facility] & did take pain meds. Later in the day patient requested his nurse. Patient refused to talk with me about what he needed. S.Marshall,RN unavailable at that time. I did let patient know this. At that time patient asked to talk with me. Patient apologizes for his behavior towards me & calling 911. He did not want me to "loose your job and get in trouble". I thanked him and asked if he needed anything at that time. He did not. Much calmer at this time. Will continue to monitor.Frank Ward Mallie Snooks

## 2014-01-14 NOTE — Progress Notes (Signed)
MR [place]'S i131 SCAN SHOWS STRONG UPTAKE. tHIS IS GOOD NEWS FOR HIM. iT GIVES [name] WE CAN GET HIS CANCER UNDER CONTROL. i HAVE DISCUSSED THIS WITH [facility] (dR sTROUD) AND ENETERED THE ORDER FOR i131 TREATMENT. tHIS WILL BE GIVEN AS A PILL. IDEALLY THE PATIENT WOULD TAKE THE PILL AND THEN BE DISCHARGED (ASSUMING HE WILL STAY BY HIMSELF A FEW DAYS)--OTHERWISE WE WOULD HAVE TO TAKE PRECAUTIONS AGAINST CONTAMINATING THE ROOM ,ETC.  aFTER D/C i WILL SCHEDULE FOLLOW UP IN MY OFFICE  gREATLY APPRECIATE HOSPITALIST'S CARE OF THIS PATIENT!  gm

## 2014-01-14 NOTE — Progress Notes (Signed)
Patient UJ:[id number] Lj Miyamoto      DOB: 03/19/1971      NWG:[id number]   Palliative Medicine Team at [facility] Progress Note  Subjective:  - patient is easily agitated with conversation, delusional and paranoid regarding medical care during this hospitalization  -"They are not giving me needed  treatments to cure my cancer", "they are just letting me die", distrust of all medical personnel   -nursing  reports patient is not taking his prescribed medications, taking intermittantly   Filed Vitals:   01/14/14 1400  BP: 151/90  Pulse: 94  Temp: 98.4 F (36.9 C)  Resp: 20   Physical exam:  General: chronically ill appearing, neck brace in place CVS: RRR Lungs: CTA Ext: BLE +1  Edema, noted areas of broken skin from scratching  Skin:  Warm and dry Psych: agitated, paranoid   Assessment and plan:  Cancer related pain/R hip down outer aspect of leg: - -continue MS Contin to 60 mg every 12 hrs - continue Dilaudid 4 mg po every 4 hrs prn breakthrough pain -continue Dilaudid 2 mg IV every 6 hrs prn (consider elimination) -Decadron 4 mg po tid (consider impact on psychosis)  Psychosis  -Seroquel as ordered and titrate as needed but if patient continues to refuse -consider alternate route of administration, possible IM (if he is not getting the medications they cannot work)  -continue with psych collaboration     Total time 35 minutes.  50% of time spent in counseling and coordination of care  Time in 1300 Time out [place] NP  Palliative Medicine Team Team Phone # [phone] Pager [phone]  Discussed with Dr Harrington Challenger and Dr Hartford Poli

## 2014-01-14 NOTE — Progress Notes (Signed)
Nurse Richardson Landry) was w/pt when arrived. Pt was sitting up in chair/fully dressed raising his voice at the nurse. Pt refused meds but wanted something for pain which nurse Richardson Landry brought during my visit with him. Pt said "they act like I'm crazy." He said a nurse accused him of trying to escape last night. He said during the night he found himself on the bathroom floor three times. He said it is coming from all this medicine they are giving me. Pt said "they are trying to kill me; I know that's what they are doing." He said they know if they kill me I will sue them. Pt was using profanity today and when I asked him not to he said ok. Pt said the nurse called security on him last night. Pt took my suggestion to just relax since they had given him pain med. I asked him if he would like me to come back and he said yes. Pt began complaining about staff again and said he did not believe he has cancer. Pt said he could just have worms in his stomach. Pt said "I think they are just experimenting on me."  Pt was not combative toward me, but he was clearly upset today during our visit. When I got ready to leave, he asked me again if I would come back. I challenged pt to try to follow staff instructions today. He said ok. Ernest Haber Chaplain  01/14/14 1200  Clinical Encounter Type  Visited With Patient

## 2014-01-14 NOTE — Telephone Encounter (Signed)
Referred back to psychiatry consult service

## 2014-01-14 NOTE — Progress Notes (Signed)
Patient has not taken any doses of Seroquel 25mg  so far this shift. Patient did take doses ordered on Saturday and Sunday. Seroquel was in med cup with broken ativan tab and would take without questioning meds on those days.Linday Rhodes Mallie Snooks

## 2014-01-14 NOTE — Progress Notes (Signed)
MR [place]'S i131 SCAN SHOWS STRONG UPTAKE. tHIS IS GOOD NEWS FOR HIM. iT GIVES [name] WE CAN GET HIS CANCER UNDER CONTROL. i HAVE DISCUSSED THIS WITH [facility] (dR sTROUD) AND ENETERED THE ORDER FOR i131 TREATMENT. tHIS WILL BE GIVEN AS A PILL. IDEALLY THE PATIENT WOULD TAKE THE PILL AND THEN BE DISCHARGED (ASSUMING HE WILL STAY BY HIMSELF A FEW DAYS)--OTHERWISE WE WOULD HAVE TO TAKE PRECAUTIONS AGAINST CONTAMINATING THE ROOM ,ETC.  aFTER D/C i WILL SCHEDULE FOLLOW UP IN MY OFFICE  gREATLY APPRECIATE HOSPITALIST'S CARE OF THIS PATIENT!  Gm  Addendum:  [facility] obtained records of prior I131 scans. They feel the patient's tumor is actually improved compared to that baseline. They are concerned giving him another large dose may permanently damage his lungs. They suggest (a) fullow up with serial thyroglobulins (b) referral to a center that can do dosimetry (eg [facility])  Accordingly I have no problem with the patient being discharged at this point. He will come top the cancer center for labs 8/26 and to see me 9/2 at 1 PM.  Again, appreciate the staff's efforts on behalf of [name]. Please let me know if I can be of further help.

## 2014-01-14 NOTE — Progress Notes (Signed)
[facility] PROGRESS NOTE   [facility] GEX:[id number] DOB: 1971/05/10 DOA: 01/07/2014 PCP: No PCP Per Patient  HPI/Subjective: I have asked the RN to see the pt with me, he is agitated this AM. Say he continues to have pain, argues in circles about his IV Dilaudid why that stopped I have explained to him in details why his IV Dilaudid now every 6 hrs to prepare him for D/C. 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 The patient has poor insight into his disease, and wants to be cured.  - He has severe pain related to his malignancy.  - Oncology consultation  - Radiation oncology said he is not a candidate for radiosurgery or neurosurgery. - Palliative care for assistance with pain management  - TSH in the high side, I will increase her levothyroxine to suppress it. - Continue dexamethasone  - Dr. Jana Ward plan noted, he will have I -131 treatment.  Behavioral issues -Patient had multiple behavioral issues all centered around his pain medications. -Patient exhibited anxiety, paranoia and flight of ideas. -Per discussion with his brother, he does have long history of same symptoms, but never seen psychiatrist to be diagnosed. -Psych evaluated the patient, placed on Seroquel at night. -According to my eval he does not have capacity to make meaningful decisions for his healthcare. -As for now he is going to receive treatment and to ALF Vs SNF in 1-2 days. -He can not refuse to to ALF, he does not have capacity per Psych and my eval (His aunt is his  POA).  Cancer related pain - Has metastases to his neck, balance and brain. Continue C-spine collar  - Patient is on OxyContin 30 mg, and this was switched to MS Contin 45 mg every 12 hours. - Continue oral Dilaudid 4 mg PO with IV for breakthrough  - Patient states the gabapentin, Lyrica have not helped and he does not want to restart them  - Patient has previously been on 150 mcg fentanyl patch which he no longer uses  - MS Contin increased to 60 mg every 12 hours.  - Patient had a lot of behavioral issues regarding his pain medications, he does not want to try oral Dilaudid. - Patient is asking always about IV medication prior to oral once, explained to him more than once he needs to try oral medication to prepare him for discharge.  Lower extremity edema, concerning for lymphatic spread of his cancer or secondary to his low albumin. - Echocardiogram did not show evidence of CHF - Vascular duplex was negative for DVT.  Hypertension, currently on no blood pressure medications  - Trend blood pressure   Anxiety  - Patient is on Ativan. - Seen by psych on 5/30 and started Seroquel each bedtime.  Normocytic anemia - Check folate, ferritin, folate and iron/ferritin levels are normal, likely anemia of chronic disease.   Code Status: Full code Family Communication: Plan discussed with the patient. Disposition Plan: Remains inpatient, planning for assisted living when he is medically ready.   Consultants:  Medical  oncology.  Radiation oncology  Procedures:  None  Antibiotics:  None   Objective: Filed Vitals:   01/14/14 0600  BP: 146/91  Pulse: 114  Temp: 98.1 F (36.7 C)  Resp: 20    Intake/Output Summary (Last 24 hours) at 01/14/14 1048 Last data filed at 01/14/14 0600  Gross per 24 hour  Intake    920 ml  Output      0 ml  Net    920 ml   Filed Weights   01/07/14 0856  Weight: 86.1 kg (189 lb 13.1 oz)    Exam: General: Alert and awake, oriented x3,  agitated, anxious.  HEENT: anicteric sclera, pupils reactive to light and accommodation, EOMI CVS: S1-S2 clear, no murmur rubs or gallops Chest: clear to auscultation bilaterally, no wheezing, rales or rhonchi Abdomen: soft nontender, nondistended, normal bowel sounds, no organomegaly Extremities: no cyanosis, clubbing or edema noted bilaterally Neuro: Cranial nerves II-XII intact, no focal neurological deficits  Data Reviewed: Basic Metabolic Panel:  Recent Labs Lab 01/08/14 0408 01/11/14 0516  NA 140 141  K 4.4 4.4  CL 100 103  CO2 27 27  GLUCOSE 218* 212*  BUN 14 9  CREATININE 0.93 0.80  CALCIUM 9.7 9.3   Liver Function Tests: No results found for this basename: AST, ALT, ALKPHOS, BILITOT, PROT, ALBUMIN,  in the last 168 hours No results found for this basename: LIPASE, AMYLASE,  in the last 168 hours No results found for this basename: AMMONIA,  in the last 168 hours CBC:  Recent Labs Lab 01/08/14 0408 01/11/14 0516  WBC 7.5 11.4*  HGB 11.5* 11.0*  HCT 35.6* 35.0*  MCV 97.3 98.0  PLT 316 304   Cardiac Enzymes: No results found for this basename: CKTOTAL, CKMB, CKMBINDEX, TROPONINI,  in the last 168 hours BNP (last 3 results) No results found for this basename: PROBNP,  in the last 8760 hours CBG: No results found for this basename: GLUCAP,  in the last 168 hours  Micro No results found for this or any previous visit (from the past 240 hour(s)).   Studies: Nm Whole Body I 131  01/13/2014   CLINICAL DATA:  Evaluate for iodine avid metastasis.  EXAM: NUCLEAR MEDICINE I-131 WHOLE BODY SCAN  TECHNIQUE: The patient received 3.2 mCi I-131 sodium iodide for the treatment of thyroid cancer within the past 10 days. The patient returns today, and whole body scanning was performed in the anterior and posterior projections.  COMPARISON:  PET-CT from 01/03/2013  FINDINGS: Small to medium size focus of mild increased uptake is identified within the thyroid bed. There is a  large focus of mild to moderate radiotracer uptake within the right mid lung and right base. Within the left midlung there is a medium size focus of moderate increased radiotracer uptake. Within the right side of posterior pelvis there is a medium size focus of intense radiotracer uptake corresponding to lytic lesion identified in the right iliac bone. Physiologic tracer activity is seen within the GI tract.  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 There is also residual iodine avid tumor and/or thyroid tissue within the thyroid bed.   Electronically Signed   By: Kerby Moors M.D.   On: 01/13/2014 16:07    Scheduled Meds: . dexamethasone  4 mg Oral TID  . docusate sodium  100 mg Oral BID  . enoxaparin (LOVENOX) injection  40 mg Subcutaneous Q24H  . levothyroxine  125 mcg Oral QAC breakfast  .  LORazepam  1 mg Oral BID  . magnesium citrate  1 Bottle Oral Once  . methocarbamol  500 mg Oral TID  . morphine  60 mg Oral Q12H  . multivitamin with minerals  1 tablet Oral Daily  . pantoprazole  40 mg Oral Daily  . QUEtiapine  25 mg Oral BID  . QUEtiapine  50 mg Oral QHS  . senna  2 tablet Oral QHS   Continuous Infusions:      Time spent: 35 minutes    Verlee Monte  [facility] Pager [phone] If 7PM-7AM, please contact night-coverage at [email or web address], password [facility] 01/14/2014, 10:48 AM  LOS: 7 days

## 2014-01-14 NOTE — Progress Notes (Signed)
Pt was in bed when I returned visit as promised. Pt was a little calmer. From our conversation this morning, pt admitted during this visit that he was trying to leave as the nurse indicated over the wknd.  He talked about his mom and brother whom he said are in [place] and he said he wants his mother to go back to [place] since he can't take care of her. He started talking about what happened earlier and that all he needs is radiation. I encouraged him to stay positive and not get upset and he agreed. [name] came in during our visit and he heard things she did not say such as when she said radiation is not an option he said she said she was a dead man. Pt's meal came and as I was leaving he reached out his hand and asked me to pray for him. Ernest Haber Chaplain  01/14/14 1300  Clinical Encounter Type  Visited With Patient;Health care provider

## 2014-01-14 NOTE — Progress Notes (Addendum)
CSW received return phone call from pt aunt and Chauncey Reading, Lucious Groves.  Pt HCPOA discussed that pt family has been concerned about pt for some time. [name] explained that she has been concerned about pt mental health in the past, but pt has never assessed or diagnosed with a mental health disorder. [name] shared that she has been witness to pt "mood swings" and has been concerned in the past that pt would harm himself. [name] discussed that she feels that ALF placement would be beneficial to pt, but is aware that pt may be difficult to place in ALF given pt behavioral disturbances and age. [name] shared that she attempted to assist pt in the past with placement in ALF near her home in [place], [place], but pt was not agreeable at that time due to pt concern about signing over disability check to ALF. [name] is very familiar with the healthcare setting and expressed understanding as CSW explained that if [facility] ALF was unable to offer a bed then pt would have to return home with pt family with the assistance of home health. [name] discussed that many family members are not capable of caring for pt due to their age. CSW inquired with pt aunt about pt brother who resides in [place] and pt aunt discussed that pt brother is recently married and recently returned from serving our country in the war and has his own things to cope with at this time. [name] discussed that pt only option for residing with family would be to return to pt mother's home where he has been residing until [name] is able to locate an ALF in the area in which she lives for pt to transfer to. [name] discussed that she is familiar with ALF's in her area, but would have to go to facilities and personally meet with administrators in order to discuss pt situation and determine if those facilities would be able to meet pt needs and [name] is aware that pt will be  unable to remain in the [place] until she is able to arrange placement at ALF in the area she lives in. [name] stated that she planned on exploring ALF in [place] and [place] and CSW discussed that CSW can send information via [facility] to ALF's in those counties, but CSW is unsure if those ALF's use the same computer system for referrals. [name] expressed understanding, but wished for CSW to initiate search to those counties in hopes that the ALFs would receive information in order for facilities to have information when [name] goes to visit to discuss placement. [name] states that she plans on contacting pt mother to discuss situation and discuss pt returning to pt mothers home if [facility] ALF is unable to offer pt a bed until [name] is able to make arrangements for pt for ALF in the area she lives in. [name] plans to contact this CSW in the morning to update this CSW on conversation with pt mother.  [name] very supportive and willing to assist pt with disposition plans, but unfortunately cannot take pt into her home at this time as [name] is in the process of moving. [name] very insightful about pt condition and behaviors and familiar with healthcare field and aware that pt behaviors will be a barrier to placement in ALF.   CSW contacted [facility] following conversation with [name]. [facility] is unable to offer pt a bed as facility feels that pt will not be a good fit with the other residents at the facility and given pt age. CSW will share news with [name] in the morning.  CSW to speak with [name] in the morning and pt will likely discharge back to pt mother's home with [facility] as [name] seeks ALF placement in [place] and [facility]  CSW to continue to follow.  [facility], MSW, [place] Work [phone]

## 2014-01-14 NOTE — Telephone Encounter (Signed)
CALLED PATIENT [id number] AND [facility] F/U APPT FOR 09/02 @ 1, LABS ON 28/26 @ 12:30. CALENDAR MAILED WITH WELCOME PACKET.

## 2014-01-15 LAB — BASIC METABOLIC PANEL
BUN: 14 mg/dL (ref 6–23)
CO2: 26 meq/L (ref 19–32)
Calcium: 9.3 mg/dL (ref 8.4–10.5)
Chloride: 99 mEq/L (ref 96–112)
Creatinine, Ser: 0.96 mg/dL (ref 0.50–1.35)
GFR calc Af Amer: >90 mL/min (ref >90)
Glucose, Bld: 134 mg/dL — ABNORMAL HIGH (ref 70–99)
Potassium: 3.9 mEq/L (ref 3.7–5.3)
SODIUM: 139 meq/L (ref 137–147)

## 2014-01-15 MED ORDER — HYDROMORPHONE HCL PF 2 MG/ML IJ SOLN
2.0000 mg | Freq: Once | INTRAMUSCULAR | Status: AC
  Administered 2014-01-15: 2 mg via INTRAVENOUS
  Filled 2014-01-15: qty 1

## 2014-01-15 NOTE — Progress Notes (Signed)
This RN was approached by the phlebotomist that patient became agitated and refused lab draws this AM. This RN went to patient's room to assess patient. Pt stated, "I was asleep and then she was standing over me with a needle asking if I was ready. I was scared because I didn't know what she was doing. I got up and saw that there was sh*t in the bathroom. I don't remember going to the bathroom." Pt stated that he had cleaned up the mess. This RN saw some stool remainder in the toilet. Pt stated, "I don't like taking all of these pills, I feel like they are messing me up, I don't remember going to the bathroom." Pt came up the nursing desk to ask if we had seen anyone come into his room during the night. Pt asked for his linens to be replaced and then asked for a second new set of linens. He stated, "these don't look clean enough. Pt had taken one shower, then stated, "I need to take another one." This RN to continue to monitor and support patient. Noreene Larsson RN

## 2014-01-15 NOTE — Progress Notes (Signed)
Patient DD:[id number] [facility]      DOB: 04/26/1971      YHC:[id number]   Palliative Medicine Team at [facility] Progress Note  Subjective:  - patient is easily agitated with conversation, delusional and paranoid regarding medical care during this hospitalization  - per nursing  patient is not taking his  medications as prescribed   -placed call to [facility] [address] [place], [place] [address]  H# [phone]  -detailed conversation regarding Advanced Directives and [place]        -comfort is a priority, family is hopeful to secure a living situation that can enhance comfort and dignity    -desire DNR/DNI to be secured    Filed Vitals:   01/15/14 0440  BP: 143/87  Pulse: 81  Temp: 98.5 F (36.9 C)  Resp: 21   Physical exam:  General: chronically ill appearing, neck brace in place, pacing in the room CVS: RRR Lungs: CTA Ext: BLE +1  Edema, Skin:  Warm and dry Psych: agitated, paranoid   Assessment and plan:  DNR/DNI  Cancer related pain/R hip down outer aspect of leg: - -continue MS Contin to 60 mg every 12 hrs - continue Dilaudid 4 mg po every 4 hrs prn breakthrough pain -continue Dilaudid 2 mg IV every 6 hrs prn (consider elimination) -Decadron 4 mg po tid (consider impact on psychosis)   Psychosis  -Seroquel as ordered and titrate as needed but if patient continues to refuse -consider alternate route of administration, possible IM (if he is not getting the medications they cannot work)  -continue with psych collaboration    Total time 35 minutes.  50% of time spent in counseling and coordination of care  Time in 1300 Time out Dozier NP  Palliative Medicine Team Team Phone # [phone] Pager [phone]  Discussed with LCSW

## 2014-01-15 NOTE — Progress Notes (Signed)
Frank Ward   DOB:1971/03/04   KY#:[id number]   BTD#:[id number]  Subjective: patient feels he is being pushed out of the hospital, that his pain is not controlled, that his medications have not been "fixed," that he needs radiation to his Right hip, that he can barely walk. No family in room   Objective: middle aged African American man examined sitting up eating breakfast Filed Vitals:   01/15/14 0440  BP: 143/87  Pulse: 81  Temp: 98.5 F (36.9 C)  Resp: 21    Body mass index is 23.11 kg/(m^2).  Intake/Output Summary (Last 24 hours) at 01/15/14 0807 Last data filed at 01/14/14 1607  Gross per 24 hour  Intake    600 ml  Output      0 ml  Net    600 ml     Sclerae unicteric  Neck collar in place  Lungs no rales or rhonchi  Heart regular rate and rhythm  Abdomen +BS  MS bilateral ankle edema    CBG (last 3)  No results found for this basename: GLUCAP,  in the last 72 hours   Labs:  Lab Results  Component Value Date   WBC 11.4* 01/11/2014   HGB 11.0* 01/11/2014   HCT 35.0* 01/11/2014   MCV 98.0 01/11/2014   PLT 304 01/11/2014   NEUTROABS 3.2 01/07/2014    @LASTCHEMISTRY @  Urine Studies No results found for this basename: UACOL, UAPR, USPG, UPH, UTP, UGL, UKET, UBIL, UHGB, UNIT, UROB, ULEU, UEPI, UWBC, URBC, UBAC, CAST, CRYS, UCOM, BILUA,  in the last 72 hours  Basic Metabolic Panel:  Recent Labs Lab 01/11/14 0516  NA 141  K 4.4  CL 103  CO2 27  GLUCOSE 212*  BUN 9  CREATININE 0.80  CALCIUM 9.3   GFR Estimated Creatinine Clearance: 142 ml/min (by C-G formula based on Cr of 0.8). Liver Function Tests: No results found for this basename: AST, ALT, ALKPHOS, BILITOT, PROT, ALBUMIN,  in the last 168 hours No results found for this basename: LIPASE, AMYLASE,  in the last 168 hours No results found for this basename: AMMONIA,  in the last 168 hours Coagulation profile No results found for this basename: INR, PROTIME,  in the last 168  hours  CBC:  Recent Labs Lab 01/11/14 0516  WBC 11.4*  HGB 11.0*  HCT 35.0*  MCV 98.0  PLT 304   Cardiac Enzymes: No results found for this basename: CKTOTAL, CKMB, CKMBINDEX, TROPONINI,  in the last 168 hours BNP: No components found with this basename: POCBNP,  CBG: No results found for this basename: GLUCAP,  in the last 168 hours D-Dimer No results found for this basename: DDIMER,  in the last 72 hours Hgb A1c No results found for this basename: HGBA1C,  in the last 72 hours Lipid Profile No results found for this basename: CHOL, HDL, LDLCALC, TRIG, CHOLHDL, LDLDIRECT,  in the last 72 hours Thyroid function studies No results found for this basename: TSH, T4TOTAL, FREET3, T3FREE, THYROIDAB,  in the last 72 hours Anemia work up No results found for this basename: VITAMINB12, FOLATE, FERRITIN, TIBC, IRON, RETICCTPCT,  in the last 72 hours Microbiology No results found for this or any previous visit (from the past 240 hour(s)).    Studies:  MRI CERVICAL SPINE WITHOUT AND WITH CONTRAST  TECHNIQUE:  Multiplanar and multiecho pulse sequences of the cervical spine, to  include the craniocervical junction and cervicothoracic junction,  were obtained according to standard protocol without and with  intravenous contrast.  CONTRAST: 17 mL MultiHance  COMPARISON: MRI of the cervical spine 08/28/2013  FINDINGS:  Mild diffuse cord signal abnormality is now evident at the C3 level.  The craniocervical junction is within normal limits.  C2-3: A rightward disc osteophyte complex is present. There is tumor  infiltration into the right neural foramen and developing the right  pedicle and posterior lateral mass of C3. There is progressive  encroachment on the canal at the C3 level with further retropulsion  of bone. The canal is now narrowed to 9 mm. Mild left facet  hypertrophy is evident.  C3-4: Retropulsed bone and tumor effaces the right-sided PICC now  with progression since  prior exam. Progressive right foraminal  stenosis is evident. The left foramen is patent.  C4-5: Mild right-sided uncovertebral disease is evident. Mild  foraminal narrowing is present bilaterally without change.  C5-6: Left-sided uncovertebral spurring is present with mild left  foraminal stenosis. The central canal and right foramen are patent.  C6-7: Negative.  C7-T1: Negative.  T1-2: An inferior vertebral body metastasis is present without  posterior extension. There is no significant extraosseous tumor or  associated stenosis.  Multiple left-sided lung nodules are present in the upper lobe,  measuring up to 16 mm these were not previously imaged. There is  some progression since the CT scan December 2014.  IMPRESSION:  1. Progressive collapse of pathologic fractures at C3 and to a  lesser extent C4.  2. Progressive right foraminal and central canal stenosis at C2-3.  3. Narrowing of the central canal to 9 mm at the level of C3 with  mass effect on the spinal cord and mild cord edema.  4. Progressive tumor encroachment into the spinal canal at the C3  level.  5. Progressive right foraminal stenosis at C3-4.  6. Mild spondylosis at C4-5 and C5-6 is stable.  7. Metastasis along the inferior aspect of the T1 vertebral body is  noted without posterior extension.  8. Multiple pulmonary nodules have increased in size since the prior  studies. The largest lesion is a medial upper lobe lesion on the  left measuring 16 mm.  Critical Value/emergent results were called by telephone at the time  of interpretation on 12/26/2013 at 4:18 PM to Dr. [name] , who  verbally acknowledged these results. And  Electronically Signed  By: Lawrence Santiago M.D.  On: 12/26/2013 16:18   Nm Whole Body I 131  01/14/2014   ADDENDUM REPORT: 01/14/2014 11:06  ADDENDUM: After further investigation it has been determined that the patient has in fact received prior radiolabeled I -31 therapy for treatment of  thyroid cancer metastasis. On 04/02/2013 at the patient recieved 852.7 millicuries of I- 782 sodium iodide. A pre therapy scan was performed at this time which showed evidence of distant metastatic disease involving the lungs and bones. Compared with the pre therapy scan dated 03/26/2013 the degree of iodine avid tumor on the current exam has improved. With the exception of the right iliac bone lesion there has been decreased activity within the lungs and bone.   Electronically Signed   By: Kerby Moors M.D.   On: 01/14/2014 11:06   01/14/2014   CLINICAL DATA:  Evaluate for iodine avid metastasis.  EXAM: NUCLEAR MEDICINE I-131 WHOLE BODY SCAN  TECHNIQUE: The patient received 3.2 mCi I-131 sodium iodide for the treatment of thyroid cancer within the past 10 days. The patient returns today, and whole body scanning was performed in the anterior and posterior  projections.  COMPARISON:  PET-CT from 01/03/2013  FINDINGS: Small to medium size focus of mild increased uptake is identified within the thyroid bed. There is a large focus of mild to moderate radiotracer uptake within the right mid lung and right base. Within the left midlung there is a medium size focus of moderate increased radiotracer uptake. Within the right side of posterior pelvis there is a medium size focus of intense radiotracer uptake corresponding to lytic lesion identified in the right iliac bone. Physiologic tracer activity is seen within the GI tract.  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 There is also residual iodine avid tumor and/or thyroid tissue within the thyroid bed.  Electronically Signed: By: Kerby Moors M.D. On: 01/13/2014 16:07    Assessment: 43 y.o. 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 I discussed the results of the I131 study and his overall situation. Our nuclear medicine MDs (specifically Dr Ilda Foil) feel the patient would be best served by being treated at a center where they can do dosimetry, in order to minimize possible damage to lungs from I131 treatment.  At this point:  --he has has poorly controlled right-sided "hip" pain, and there is an area of increased uptake associated with a lytic lesion on scans--consider external beam radiation to this area palliatively  --patient should have referral to endocrinologist as outpatient (Dr Chalmers Cater? Dr Buddy Duty?)  --patient will need pain management as outpatient--consider referral to pain clinic  --psychiatric and social work issues are being addressed (homeless patient w/o transportation)  Consider transfer to [facility] or [facility] with a view to I131 dosimetry-guided therapy.  I will be glad to follow Mr Kovacevic as an outpatient. He understands I am not a thyroid cancer specialist and do not do radioiodine treatment. If and when he comes under Hospice's umbrella I will be glad to be his hospice-associated MD  Chauncey Cruel, MD 01/15/2014  8:07 AM

## 2014-01-15 NOTE — Progress Notes (Addendum)
Patient ID: Frank Ward, male   DOB: [birth year], 43 y.o.   MRN: [id number] [facility] PROGRESS NOTE  Frank Ward HYQ:[id number] DOB: 1971-04-28 DOA: 01/07/2014 PCP: No PCP Per Patient  Brief narrative: 43 y.o. 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 Despite limited treatment he has received so far he has had progressive collapse of pathologic fractures at C3 and to a lesser extent C4 area, narrowing of the central canal to 9 mm at the level of C3 with mass effect on the spinal cord and mild cord edema, progressive tumor encroachment into the spinal canal at the C3 level. MRI of thoracic and lumbar spine done 12/30/2013 demonstrated reactive marrow changes versus early osseous tumor. There is bulky tumor with epidural and extraosseous extension at T12 in the right L1 neural foramina. He also had bulky sacral tumor with extraosseous extension including impingement of the exiting left S1 nerve root. Additionally, he has extensive pulmonary metastatic disease. He presented to [facility] ED 01/09/2014 with intractable pain and has reported no significant pain relief with home analgesia.  There is a significant barrier to discharge as patient claims his pain is not controlled and he demands having dilaudid IV for pain control. Additionally, he was seen and evaluated by psychiatry and determined he does not have capacity to make decisions about his medical treatment or living arrangements. During this hospital stay he has had I 131 scan which showed significant uptake and per  Dr. Jana Hakim pt may benefit from being transferred to [facility] for dosimetry treatment and to observe for potential lung toxicity.  Assessment/Plan:   Principal Problem: Metastatic papillary thyroid cancer   Metastatic disease in lungs, bones, spinal cord. Multiple discussions held about the extent of the disease but pt still adamant about wanting the treatment/cure. Radiation oncology did not feel that further RT will improve the progression of the metastases and that it would provide quality of life. They have recommended palliative care to be involved for goals of care.   Palliative care team is assisting with pain management.  Recent TSH was high, 27 and I131 scan showed significant uptake; plan is to transfer to [facility] if possible for dosimetry   Continue decadron 4 mg PO TID  Continue levothyroxine 125 mcg daily   Active Problems: Behavioral issues/ Anxiety and paranoia   Patient has multiple behavioral issues all centered around his pain medications. Pt family reports he has a long history of behavioral issues including anxiety, paranoia, aggression.Pt was seen and evaluated by psych who recommended Haldol 1 mg PO TID, ativan 1 mg PO BID, Seroquel 25 mg PO BID and 50 mg at bedtime Cancer related pain   Has neck collar for stabilization  Current pain regimen: MS contin 60 mg PO Q 12 hours, robaxin 500 mg PO TID, dilaudid 2 mg IV every 6 hours PRN, dilaudid 4 mg PO every 4 hours PRN  Lower extremity edema  Concerning for lymphatic spread of his cancer or secondary to his low albumin.   Echocardiogram did not show evidence of CHF   Vascular duplex was negative for DVT.  Anemia of chronic disease  Secondary to malignancy  Hemoglobin is stable, no current indications for transfusion   DVT prophylaxis: Lovenox subQ  Code Status: full code  Family Communication: plan of care discussed with the patient Disposition Plan: working on transfer to [facility] (numbers: [phone] and transfer  center: [phone])   Consultants:  Medical oncology.  Radiation oncology Procedures:  None Antibiotics:  None   Robbie Lis, MD  [facility] Pager [phone]  If 7PM-7AM, please contact night-coverage [email or web address] Password [email or web address] 01/15/2014, 1:32 PM   LOS: 8 days    HPI/Subjective: No acute overnight events.  Objective: Filed Vitals:   01/14/14 0600 01/14/14 1400 01/14/14 2120 01/15/14 0440  BP: 146/91 151/90 158/94 143/87  Pulse: 114 94 82 81  Temp: 98.1 F (36.7 C) 98.4 F (36.9 C) 98.6 F (37 C) 98.5 F (36.9 C)  TempSrc: Oral Oral Oral Oral  Resp: 20 20 16 21   Height:      Weight:      SpO2: 100% 98% 99% 98%    Intake/Output Summary (Last 24 hours) at 01/15/14 1332 Last data filed at 01/15/14 0900  Gross per 24 hour  Intake    720 ml  Output      0 ml  Net    720 ml    Exam:   General:  Pt is alert, follows commands appropriately, not in acute distress  Cardiovascular: Regular rate and rhythm, [name], no murmurs  Respiratory: Clear to auscultation bilaterally, no wheezing, no crackles  Abdomen: Soft, non tender, non distended, bowel sounds present  Extremities:  pulses DP and PT palpable bilaterally  Neuro: Grossly nonfocal  Data Reviewed: Basic Metabolic Panel:  Recent Labs Lab 01/11/14 0516 01/15/14 0730  NA 141 139  K 4.4 3.9  CL 103 99  CO2 27 26  GLUCOSE 212* 134*  BUN 9 14  CREATININE 0.80 0.96  CALCIUM 9.3 9.3   Liver Function Tests: No results found for this basename: AST, ALT, ALKPHOS, BILITOT, PROT, ALBUMIN,  in the last 168 hours No results found for this basename: LIPASE, AMYLASE,  in the last 168 hours No results found for this basename: AMMONIA,  in the last 168 hours CBC:  Recent Labs Lab 01/11/14 0516  WBC 11.4*  HGB 11.0*  HCT 35.0*  MCV 98.0  PLT 304   Cardiac Enzymes: No results found for this basename: CKTOTAL, CKMB, CKMBINDEX, TROPONINI,  in the last 168 hours BNP: No components found  with this basename: POCBNP,  CBG: No results found for this basename: GLUCAP,  in the last 168 hours  No results found for this or any previous visit (from the past 240 hour(s)).   Studies: No results found.  Scheduled Meds: . dexamethasone  4 mg Oral TID  . docusate sodium  100 mg Oral BID  . enoxaparin (LOVENOX)   40 mg Subcutaneous Q24H  . furosemide  40 mg Intravenous Once  . haloperidol  1 mg Oral TID  . levothyroxine  125 mcg Oral QAC breakfast  . LORazepam  1 mg Oral BID  . magnesium citrate  1 Bottle Oral Once  . methocarbamol  500 mg Oral TID  . morphine  60 mg Oral Q12H  . multivitamin   1 tablet Oral Daily  . pantoprazole  40 mg Oral Daily  . QUEtiapine  25 mg Oral BID  . QUEtiapine  50 mg Oral QHS  . senna  2 tablet Oral QHS

## 2014-01-15 NOTE — Progress Notes (Signed)
[place]. Pt BP 175/91, HR 115 this evening. Pt taking frequent showers this evening, no c/o pain. Pt about to receive several sedating medicines at 2200. On call [employer] notified, no new orders at this time due to meds pt will receive at 2200. Will monitor pt at this time and recheck vitals after pt receives his scheduled meds. Noreene Larsson RN

## 2014-01-15 NOTE — Progress Notes (Signed)
CSW continuing to follow.  CSW received notification that pt clinicals are being reviewed by [facility] for potential acute to acute transfer to [facility].  CSW received phone call from pt aunt, Lucious Groves this morning. CSW shared with pt aunt that [facility] was unable to offer pt a bed. Pt aunt inquiring when pt will be discharged from hospital and CSW shared that pt is being considered for acute to acute transfer to [facility] for treatment. Pt expressed relief for this possible option and stated that she has been hoping for pt to be able to go to a hospital that has treatment options.  CSW received phone call from pasarr reviewer stating that pt had been referred for face to face review. Per pasarr reviewer, pasarr reviewer will likely be at hospital to meet with pt later this afternoon or tomorrow.   CSW met with pt at bedside to notify pt that pasarr reviewer will be coming to meet with pt. Patient is easily agitated with conversation about disposition planning and shared that he is confused because one person is talking about transfer to [facility] and CSW is talking about ALF. CSW expressed understanding and clarified with pt that MD is discussing with [facility] about potential transfer, but CSW is attempting to assist pt with steps need to go to ALF at any time if an ALF accepts pt. CSW shared that [facility] was unable to offer a bed. CSW discussed that pt aunt expressed that she would assist pt in locating ALF close to her home if pt was interested. Pt agitated with discussion about ALF and involvement of pt aunt in discussion about plans. Pt stated that he wants the housing authority to come to the hospital and CSW explained that the housing authority does not come to the hospital and that pt and CSW had contacted the housing authority last week to confirm that pt was on the waiting list for housing.   Pt delusional and paranoid regarding medical care during this hospitalization and  feels that he is just being pushed out of the hospital.   After leaving pt room, CSW received phone call from RN stating that [facility] stating that they had received and reviewed referral and may be interested in assisting pt. CSW shared with RN to provide facility with this CSW contact information to contact this CSW, but at this time awaiting determination from [facility] about potential acute to acute transfer.   CSW to continue to follow.  Alison Murray, MSW, [place] Work [phone]

## 2014-01-16 LAB — THYROGLOBULIN LEVEL

## 2014-01-16 LAB — THYROGLOBULIN ANTIBODY: Thyroglobulin Ab: <20 IU/mL (ref <40.0)

## 2014-01-16 MED ORDER — MAGNESIUM CITRATE PO SOLN
1.0000 | Freq: Once | ORAL | Status: AC
  Administered 2014-01-16: 1 via ORAL
  Filled 2014-01-16: qty 296

## 2014-01-16 NOTE — Progress Notes (Addendum)
Patient ID: Frank Ward, male   DOB: 1970-12-02, 43 y.o.   MRN: [id number] [facility] PROGRESS NOTE  Frank Ward YOV:[id number] DOB: 03-06-1971 DOA: 01/07/2014 PCP: No PCP Per Patient  Brief narrative: 43 y.o. 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 Despite limited treatment he has received so far he has had progressive collapse of pathologic fractures at C3 and to a lesser extent C4 area, narrowing of the central canal to 9 mm at the level of C3 with mass effect on the spinal cord and mild cord edema, progressive tumor encroachment into the spinal canal at the C3 level. MRI of thoracic and lumbar spine done 12/30/2013 demonstrated reactive marrow changes versus early osseous tumor. There is bulky tumor with epidural and extraosseous extension at T12 in the right L1 neural foramina. He also had bulky sacral tumor with extraosseous extension including impingement of the exiting left S1 nerve root. Additionally, he has extensive pulmonary metastatic disease. He presented to [facility] ED 01/09/2014 with intractable pain and has reported no significant pain relief with home analgesia.  There is a significant barrier to discharge as patient claims his pain is not controlled and he demands having dilaudid IV for pain control. Additionally, he was seen and evaluated by psychiatry and determined he does not have capacity to make decisions about his medical treatment or living arrangements. During this hospital stay he has had I 131 scan which showed significant uptake and per  Dr. Jana Hakim pt may benefit from being transferred to [facility] for dosimetry treatment and to observe for potential lung toxicity.   Assessment/Plan:   Principal Problem:  Metastatic papillary thyroid cancer  Metastatic disease in lungs, bones, spinal cord. Multiple discussions with the pt held about the extent of the disease but pt still adamant about wanting the treatment/cure. Radiation oncology did not feel that further RT will improve the progression of the metastases and or that would provide quality of life. They have recommended palliative care to be involved for goals of care.  Palliative care team is assisting with pain management at this time. Pain seem sto be controlled with MS contin 60 mg PO Q 12 hours, robaxin 500 mg PO TID, dilaudid 2 mg IV every 6 hours PRN, dilaudid 4 mg PO every 4 hours PRN  Recent TSH was high at 27 and I-131 scan showed significant uptake; plan is to transfer to [facility] if possible for dosimetry; awaiting the decision from internal medicine teaching service [facility] [place] [facility] Continue decadron 4 mg PO TID  Continue levothyroxine 125 mcg daily  Active Problems:  Behavioral issues/ Anxiety and paranoia  Patient has multiple behavioral issues all centered around his pain medications. Pt family reports he has a long history of behavioral issues including anxiety, paranoia, aggression. Pt was seen and evaluated by psych who recommended Haldol 1 mg PO TID, ativan 1 mg PO BID, Seroquel 25 mg PO BID and 50 mg at bedtime Cancer related pain  Continue neck collar per neurosurgery recommendations. Current pain regimen: MS contin 60 mg PO Q 12 hours, robaxin 500 mg PO TID, dilaudid 2  mg IV every 6 hours PRN, dilaudid 4 mg PO every 4 hours PRN  Lower extremity edema  Concerning for lymphatic spread of his cancer or secondary to his low albumin.  Echocardiogram did not show evidence of CHF  Vascular duplex was negative for DVT Ambulating in hallways.  Anemia of chronic disease   Secondary to malignancy  Hemoglobin is stable, no current indications for transfusion    DVT prophylaxis: Lovenox subQ   Code Status: DNR/DNI Family Communication: plan of care discussed with the patient  Disposition Plan: working on transfer to [facility] (numbers: [phone] and transfer center: [phone])    Consultants:  Medical oncology.  Radiation oncology Procedures:  None Antibiotics:  None   Robbie Lis, MD  [facility] Pager [phone]  If 7PM-7AM, please contact night-coverage [email or web address] Password [email or web address] 01/16/2014, 1:52 PM   LOS: 9 days    HPI/Subjective: No acute overnight events.  Objective: Filed Vitals:   01/15/14 0440 01/15/14 1500 01/15/14 2025 01/16/14 0506  BP: 143/87 157/92 175/97 148/85  Pulse: 81 103 115 105  Temp: 98.5 F (36.9 C) 98 F (36.7 C) 97.7 F (36.5 C) 98 F (36.7 C)  TempSrc: Oral Oral Oral Oral  Resp: 21 18 16 16   Height:      Weight:      SpO2: 98% 99% 98% 99%    Intake/Output Summary (Last 24 hours) at 01/16/14 1352 Last data filed at 01/16/14 0507  Gross per 24 hour  Intake    480 ml  Output      0 ml  Net    480 ml    Exam:   General:  Pt is alert, follows commands appropriately, not in acute distress; has cervical collar   Cardiovascular: Regular rate and rhythm, [name], no murmurs  Respiratory: Clear to auscultation bilaterally, no wheezing  Abdomen: Soft, non tender, non distended, bowel sounds present  Extremities: pitting edema, pulses DP and PT palpable bilaterally  Neuro: Grossly nonfocal  Data Reviewed: Basic Metabolic Panel:  Recent Labs Lab 01/11/14 0516 01/15/14 0730  NA 141 139  K 4.4 3.9  CL 103 99  CO2 27 26  GLUCOSE 212* 134*  BUN 9 14  CREATININE 0.80 0.96  CALCIUM 9.3 9.3   Liver Function Tests: No results found for this basename: AST, ALT, ALKPHOS, BILITOT, PROT, ALBUMIN,  in the last 168 hours No results found for this basename: LIPASE, AMYLASE,  in the last 168  hours No results found for this basename: AMMONIA,  in the last 168 hours CBC:  Recent Labs Lab 01/11/14 0516  WBC 11.4*  HGB 11.0*  HCT 35.0*  MCV 98.0  PLT 304   Cardiac Enzymes: No results found for this basename: CKTOTAL, CKMB, CKMBINDEX, TROPONINI,  in the last 168 hours BNP: No components found with this basename: POCBNP,  CBG: No results found for this basename: GLUCAP,  in the last 168 hours  No results found for this or any previous visit (from the past 240 hour(s)).   Studies: No results found.  Scheduled Meds: . dexamethasone  4 mg Oral TID  . docusate sodium  100 mg Oral BID  . enoxaparin (LOVENOX) injection  40 mg Subcutaneous Q24H  . furosemide  40 mg Intravenous Once  . haloperidol  1 mg Oral TID  . levothyroxine  125 mcg Oral QAC breakfast  . LORazepam  1 mg Oral BID  . magnesium citrate  1 Bottle Oral Once  . magnesium citrate  1 Bottle Oral  Once  . methocarbamol  500 mg Oral TID  . morphine  60 mg Oral Q12H  . multivitamin with minerals  1 tablet Oral Daily  . pantoprazole  40 mg Oral Daily  . QUEtiapine  25 mg Oral BID  . QUEtiapine  50 mg Oral QHS  . senna  2 tablet Oral QHS   Continuous Infusions:

## 2014-01-16 NOTE — Progress Notes (Signed)
Pt asked not to be disturbed last pm. BP this AM 148/85 HR 105, decreased from earlier note. Pt c/o increased pain 8/10 in his neck. When asked why his pain had increased, Pt stated he was doing "push-ups". He demonstrated this by pushing up with his arms on his walker. This RN explain that he should not do exercises like this because he has neck fracture. Pt verbalized understanding. PO dilaudid and ativan were administered to pain for pain control. Noreene Larsson RN

## 2014-01-16 NOTE — Progress Notes (Signed)
Pt was lying in bed and awake when I arrived. Pt said they had given him a lot of pills and he was sleepy today. I gave him a prayer shawl and told him we would have a brief visit so he could rest. Pt wanted me to have a seat. He talked about wanting to sue the hospital saying now he has pneumonia. I asked him why he wanted to sue bc he has pneumonia. He said I need to get some money so I can get my momma out of the [place]. He said when I die at least she will be taken care of. Pt wanted me to call someone w/[employer]. I told him I cannot/will not make that call for him. Pt said ok. Pt started dozing off. Pt was appreciative of visit and during part of the visit he began to doze on and off.  Ernest Haber Chaplain

## 2014-01-16 NOTE — Progress Notes (Signed)
[facility] called and gave me the number to the [facility].  [phone]   They said that Mr. Cueva needs to make an outpatient appointment with them in the next 2-4 weeks.  They also told me that the clinic knows about Mr. Ledonne.         Suzan Slick

## 2014-01-17 MED ORDER — HYDROMORPHONE HCL PF 2 MG/ML IJ SOLN
2.0000 mg | INTRAMUSCULAR | Status: DC | PRN
  Administered 2014-01-17 – 2014-01-21 (×31): 2 mg via INTRAVENOUS
  Filled 2014-01-17 (×31): qty 1

## 2014-01-17 NOTE — Progress Notes (Signed)
Patient was seen all dressed up and tried to leave the floor.States he is leaving the hospital.Dr Charlies Silvers and Jena Gauss , nursing Director talked to patient and eventually escorted him back to his room.Pain med was changed to q 2hrs as his main issues is pain management.Patient also agreed to have his IV site rotated. IV therapist notified.Patient was verbally abusive to NT but eventually calmed down, also patient keeps his bed on high position and was told again to lower it, pt non compliant.

## 2014-01-17 NOTE — Progress Notes (Signed)
Patient ID: Frank Ward, male   DOB: 09-22-1970, 43 y.o.   MRN: [id number] [facility] PROGRESS NOTE  Roberth Niall Illes IWO:[id number] DOB: 1971/04/22 DOA: 01/07/2014 PCP: No PCP Per Patient  Brief narrative: 43 y.o. 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 Despite limited treatment he has received so far he has had progressive collapse of pathologic fractures at C3 and to a lesser extent C4 area, narrowing of the central canal to 9 mm at the level of C3 with mass effect on the spinal cord and mild cord edema, progressive tumor encroachment into the spinal canal at the C3 level. MRI of thoracic and lumbar spine done 12/30/2013 demonstrated reactive marrow changes versus early osseous tumor. There is bulky tumor with epidural and extraosseous extension at T12 in the right L1 neural foramina. He also had bulky sacral tumor with extraosseous extension including impingement of the exiting left S1 nerve root. Additionally, he has extensive pulmonary metastatic disease. He presented to [facility] ED 01/09/2014 with intractable pain and has reported no significant pain relief with home analgesia.  There is a significant barrier to discharge as patient claims his pain is not controlled and he demands having dilaudid IV for pain control. Additionally, he was seen and evaluated by psychiatry and determined he does not have capacity to make decisions about his medical treatment or living arrangements. During this hospital stay he has had I 131 scan which showed significant uptake and per  Dr. Jana Hakim pt may benefit from being transferred to [facility] for dosimetry treatment and to observe for potential lung toxicity. [facility] did not accept the transfer. This is a very challenging disposition because patient has now capacity to make medical decisions or living arrangements so he is not safe to go home. Due to behavioral issues skilled nursing facility would not be an option. Patient has too many complicated medical issues and is not appropriate for behavioral health placement.  Assessment/Plan:   Principal Problem:  Metastatic papillary thyroid cancer  Metastatic disease in lungs, bones, spinal cord. Multiple discussions with the pt held about the extent of the disease but pt still adamant about wanting the treatment/cure. Radiation oncology did not feel that further RT will improve the progression of the metastases and or that would provide quality of life. They have recommended palliative care to be involved for goals of care.  Palliative care team is assisting with pain management at this time. Pain seem sto be controlled with MS contin 60 mg PO Q 12 hours, robaxin 500 mg PO TID, dilaudid 2 mg IV every 2 hours PRN, dilaudid 4 mg PO every 4 hours PRN.  Recent TSH was high at 27 and I-131 scan showed significant uptake; plan  was transferred to [facility] for possible dosimetry however [facility] did not accept the transfer and has made recommendations for outpatient followup in 2-4 weeks.  Continue decadron 4 mg PO TID  Continue levothyroxine 125 mcg daily  Active Problems:  Behavioral issues/ Anxiety and paranoia  Patient has multiple behavioral issues all centered around his pain medications. Pt family reports he has a long history  of behavioral issues including anxiety, paranoia, aggression.  Pt was seen and evaluated by psych who recommended Haldol 1 mg PO TID, ativan 1 mg PO BID, Seroquel 25 mg PO BID and 50 mg at bedtime. Patient continues to have episodes of paranoia and aggression.  Cancer related  pain  Continue neck collar per neurosurgery recommendations.  Current pain regimen: MS contin 60 mg PO Q 12 hours, robaxin 500 mg PO TID, dilaudid 2 mg IV every 62ours PRN, dilaudid 4 mg PO every 4 hours PRN  Lower extremity edema  Concerning for lymphatic spread of his cancer or secondary to his low albumin.  Please note that order was placed for Lasix IV but patient refused the medication.  Echocardiogram did not show evidence of CHF  Vascular duplex was negative for DVT  Ambulating in hallways.  Anemia of chronic disease  Secondary to malignancy  Hemoglobin is stable, no current indications for transfusion  DVT prophylaxis: Lovenox subQ    Code Status: DNR/DNI  Family Communication: plan of care discussed with the patient  Disposition Plan: remains inpatient; [facility] did not accept transfer; pt can follow up with endo outpt in 2-4 weeks   Consultants:  Medical oncology.  Radiation oncology Procedures:  None Antibiotics:  None   Robbie Lis, MD  [facility] Pager [phone]  If 7PM-7AM, please contact night-coverage [email or web address] Password [email or web address] 01/17/2014, 12:18 PM   LOS: 10 days    HPI/Subjective: No acute overnight events.  Objective: Filed Vitals:   01/16/14 0506 01/16/14 1500 01/16/14 2200 01/17/14 0750  BP: 148/85 149/89    Pulse: 105 94    Temp: 98 F (36.7 C) 98.7 F (37.1 C)    TempSrc: Oral Oral    Resp: 16 18 16 16   Height:      Weight:      SpO2: 99% 98%      Intake/Output Summary (Last 24 hours) at 01/17/14 1218 Last data filed at 01/17/14 0750  Gross per 24 hour  Intake    920 ml  Output      0 ml  Net    920 ml    Exam:   General:  Pt is alert, agitated, tangential thinking, speech fast; has neck colalr   Cardiovascular: Regular rate and rhythm, [name], no murmurs  Respiratory: Clear to auscultation bilaterally, no wheezing  Abdomen: Soft, non tender, non distended, bowel sounds present  Extremities: Pitting pedal edema +1-2,  pulses DP and PT palpable bilaterally  Neuro: Grossly nonfocal  Data Reviewed: Basic Metabolic Panel:  Recent Labs Lab 01/11/14 0516 01/15/14 0730  NA 141 139  K 4.4 3.9  CL 103 99  CO2 27 26  GLUCOSE 212* 134*  BUN 9 14  CREATININE 0.80 0.96  CALCIUM 9.3 9.3   Liver Function Tests: No results found for this basename: AST, ALT, ALKPHOS, BILITOT, PROT, ALBUMIN,  in the last 168 hours No results found for this basename: LIPASE, AMYLASE,  in the last 168 hours No results found for this basename: AMMONIA,  in the last 168 hours CBC:  Recent Labs Lab 01/11/14 0516  WBC 11.4*  HGB 11.0*  HCT 35.0*  MCV 98.0  PLT 304   Cardiac Enzymes: No results found for this basename: CKTOTAL, CKMB, CKMBINDEX, TROPONINI,  in the last 168 hours BNP: No components found with this basename: POCBNP,  CBG: No results found for this basename: GLUCAP,  in the last 168 hours  No results found for this or any previous visit (  from the past 240 hour(s)).   Studies: No results found.  Scheduled Meds: . dexamethasone  4 mg Oral TID  . docusate sodium  100 mg Oral BID  . enoxaparin (LOVENOX) injection  40 mg Subcutaneous Q24H  . furosemide  40 mg Intravenous Once  . haloperidol  1 mg Oral TID  . levothyroxine  125 mcg Oral QAC breakfast  . LORazepam  1 mg Oral BID  . magnesium citrate  1 Bottle Oral Once  . methocarbamol  500 mg Oral TID  . morphine  60 mg Oral Q12H  . multivitamin with minerals  1 tablet Oral Daily  . pantoprazole  40 mg Oral Daily  . QUEtiapine  25 mg Oral BID  . QUEtiapine  50 mg Oral QHS  . senna  2 tablet Oral QHS

## 2014-01-17 NOTE — Progress Notes (Signed)
CSW continuing to follow.  [facility] did not accept pt for acute to acute transfer. Pt was notified by MD this morning then seen all dressed up and trying to leave the hospital and stating that since [facility] did not accept him he was told he would be discharge. MD was able to speak with pt and clarify that pt is not discharged. Pt very agitated at this time and MD and Unit Director able to get pt to return to his room.  Pt has many barriers to discharge and most significantly pt pain control has not yet been able to be managed. Pt was deemed by psychiatry not to have capacity to make decision at this time. Pt aunt, Frank Ward is pt HCPOA.  Additionally, he was seen and evaluated by psychiatry and determined he does not have capacity to make decisions at this time.  The pt disposition is challenging due to the fact that pt pain is not yet controlled and pt has poor support system. Pt homeless, but staying with mother prior to admission per pt HCPOA. Pt HCPOA reports that pt mother home is the only option for discharge because no other family members are able to provide a place for pt to stay. Due to pt behavioral issues, ALF is not an option at this time. Pt medical issues are too complicated which makes pt not appropriate for behavioral health placement.   MD planned to contact HCPOA, Frank Ward to further discuss plan of care and address pt family goals.   CSW to continue to follow to provide support and assist as appropriate, but unfortunately pt does not have options for facility placement due to pt behaviors and complexity of care.   Alison Murray, MSW, [place] [facility] [phone]

## 2014-01-17 NOTE — Progress Notes (Signed)
Pt was getting back into bed when arrived; housekeeping was finishing up. Pt said he was discharged this morning but said "they changed their mind."  He said they want me to go to some home. Pt dozed off and on during our short visit. Pt said he really appreciated me stopping by and showing that someone cares. I reaffirmed to him that "we" do care.  Pt said he would rather a stranger come by than some of his family.  Pt said he enjoyed the company.  Pt started dozing again so I encouraged him to rest  01/17/14 1400  Clinical Encounter Type  Visited With Patient  .  Frank Ward Chaplain.

## 2014-01-18 MED ORDER — VITAMINS A & D EX OINT
TOPICAL_OINTMENT | CUTANEOUS | Status: AC
  Administered 2014-01-18: 17:00:00
  Filled 2014-01-18: qty 5

## 2014-01-18 NOTE — Progress Notes (Signed)
PROGRESS NOTE  Frank Ward IWP:[id number] DOB: Apr 26, 1971 DOA: 01/07/2014 PCP: No PCP Per Patient  Brief narrative: 43 y.o. 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 Despite limited treatment he has received so far he has had progressive collapse of pathologic fractures at C3 and to a lesser extent C4 area, narrowing of the central canal to 9 mm at the level of C3 with mass effect on the spinal cord and mild cord edema, progressive tumor encroachment into the spinal canal at the C3 level. MRI of thoracic and lumbar spine done 12/30/2013 demonstrated reactive marrow changes versus early osseous tumor. There is bulky tumor with epidural and extraosseous extension at T12 in the right L1 neural foramina. He also had bulky sacral tumor with extraosseous extension including impingement of the exiting left S1 nerve root. Additionally, he has extensive pulmonary metastatic disease. He presented to [facility] ED 01/09/2014 with intractable pain and has reported no significant pain relief with home analgesia.   There is a significant barrier to discharge as patient claims his pain is not controlled and he demands having dilaudid IV for pain control. Additionally, he was seen and evaluated by psychiatry and determined he does not have capacity to make decisions about his medical treatment or living arrangements. During this hospital stay he has had I 131 scan which showed significant uptake and per Dr. Jana Hakim pt may benefit from being transferred to [facility] for dosimetry treatment and to observe for  potential lung toxicity. [facility] did not accept the transfer. This is a very challenging disposition because patient does not have capacity to make medical decisions or living arrangements so he is not safe to go home. Due to behavioral issues skilled nursing facility would not be an option. Patient has too many complicated medical issues and is not appropriate for behavioral health placement.   HPI/Subjective: No acute overnight events. Met patient today for first time, spent ~20 minutes while he was explaining everything. Talking very fast, tangential and appears upset that he is not getting radiation treatment.     Assessment/Plan:   Principal Problem:  Metastatic papillary thyroid cancer  Metastatic disease in lungs, bones, spinal cord. Multiple discussions with the pt held about the extent of the disease but pt still adamant about wanting the treatment/cure. Radiation oncology did not feel that further RT will improve the progression of the metastases and or that would provide quality of life. They have recommended palliative care to be involved for goals of care.  Palliative care team is assisting with pain management at this time. Pain seem sto be controlled with MS contin 60 mg PO Q 12 hours, robaxin 500 mg PO TID, dilaudid 2 mg IV every 2 hours PRN, dilaudid 4 mg PO every 4 hours PRN.  Recent TSH was high at 27 and I-131 scan showed significant uptake; plan  was transferred to [facility] for possible dosimetry however [facility] did not accept the transfer and has made recommendations for outpatient followup in 2-4 weeks.  Continue decadron 4 mg PO TID  Continue levothyroxine 125 mcg daily  Active Problems:  Behavioral issues/  Anxiety and paranoia  Patient has multiple behavioral issues all centered around his pain medications. Pt family reports he has a long history of behavioral issues including anxiety, paranoia, aggression.  Pt was seen and evaluated by psych who recommended Haldol 1 mg PO TID,  ativan 1 mg PO BID, Seroquel 25 mg PO BID and 50 mg at bedtime. Patient continues to have episodes of paranoia and aggression.  Cancer related pain  Continue neck collar per neurosurgery recommendations.  Current pain regimen: MS contin 60 mg PO Q 12 hours, robaxin 500 mg PO TID, dilaudid 2 mg IV every 62ours PRN, dilaudid 4 mg PO every 4 hours PRN  Lower extremity edema  Concerning for lymphatic spread of his cancer or secondary to his low albumin.  Please note that order was placed for Lasix IV but patient refused the medication.  Echocardiogram did not show evidence of CHF  Vascular duplex was negative for DVT  Ambulating in hallways.  Anemia of chronic disease  Secondary to malignancy  Hemoglobin is stable, no current indications for transfusion  DVT prophylaxis: Lovenox subQ   Code Status: DNR/DNI  Family Communication: plan of care discussed with the patient  Disposition Plan: remains inpatient; [facility] did not accept transfer; pt can follow up with endo outpt in 2-4 weeks   Consultants:  Medical oncology.  Radiation oncology Procedures:  None Antibiotics:  None  Objective: Filed Vitals:   01/17/14 0750 01/17/14 1500 01/17/14 2138 01/18/14 0657  BP:  153/94 146/92 151/90  Pulse:  102 109 111  Temp:  98.8 F (37.1 C) 97.7 F (36.5 C) 98.1 F (36.7 C)  TempSrc:  Oral Oral Oral  Resp: '16 18 16 20  ' Height:      Weight:      SpO2:  98% 95% 98%    Intake/Output Summary (Last 24 hours) at 01/18/14 1101 Last data filed at 01/18/14 0657  Gross per 24 hour  Intake   1200 ml  Output      0 ml  Net   1200 ml    Exam:   General:  Pt is alert, agitated, tangential thinking, speech fast; not wearing his neck collar today   Cardiovascular: Regular rate and rhythm, [name], no murmurs  Respiratory: Clear to auscultation bilaterally, no wheezing  Abdomen: Soft, non tender, non distended, bowel sounds present  Extremities: Pitting pedal edema +1-2, pulses DP and PT palpable  bilaterally  Neuro: Grossly nonfocal  Data Reviewed: Basic Metabolic Panel:  Recent Labs Lab 01/15/14 0730  NA 139  K 3.9  CL 99  CO2 26  GLUCOSE 134*  BUN 14  CREATININE 0.96  CALCIUM 9.3   Liver Function Tests: No results found for this basename: AST, ALT, ALKPHOS, BILITOT, PROT, ALBUMIN,  in the last 168 hours No results found for this basename: LIPASE, AMYLASE,  in the last 168 hours No results found for this basename: AMMONIA,  in the last 168 hours CBC: No results found for this basename: WBC, NEUTROABS, HGB, HCT, MCV, PLT,  in the last 168 hours Cardiac Enzymes: No results found for this basename: CKTOTAL, CKMB, CKMBINDEX, TROPONINI,  in the last 168 hours BNP: No components found with this basename: POCBNP,  CBG: No results found for this basename: GLUCAP,  in the last 168 hours  No results found for this or any previous visit (from the past 240 hour(s)).   Studies: No results found.  Scheduled Meds: . dexamethasone  4 mg Oral TID  . docusate sodium  100 mg Oral BID  . enoxaparin (LOVENOX) injection  40 mg Subcutaneous Q24H  . furosemide  40 mg Intravenous Once  . haloperidol  1 mg Oral TID  . levothyroxine  125 mcg Oral QAC breakfast  . LORazepam  1 mg Oral BID  . magnesium citrate  1 Bottle Oral Once  . methocarbamol  500 mg Oral TID  . morphine  60 mg Oral Q12H  . multivitamin with minerals  1 tablet Oral Daily  . pantoprazole  40 mg Oral Daily  . QUEtiapine  25 mg Oral BID  . QUEtiapine  50 mg Oral QHS  . senna  2 tablet Oral QHS    Costin M. Cruzita Lederer, MD [facility] [phone]  Time spent: 35 minutes, 20 minutes direct patient contact  If 7PM-7AM, please contact night-coverage [email or web address] Password [email or web address]

## 2014-01-19 MED ORDER — FUROSEMIDE 10 MG/ML IJ SOLN
20.0000 mg | Freq: Once | INTRAMUSCULAR | Status: AC
  Administered 2014-01-19: 20 mg via INTRAVENOUS
  Filled 2014-01-19: qty 2

## 2014-01-19 NOTE — Progress Notes (Signed)
PROGRESS NOTE  Frank Ward OAC:[id number] DOB: 1971/05/27 DOA: 01/07/2014 PCP: No PCP Per Patient  Brief narrative: 43 y.o. 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 Despite limited treatment he has received so far he has had progressive collapse of pathologic fractures at C3 and to a lesser extent C4 area, narrowing of the central canal to 9 mm at the level of C3 with mass effect on the spinal cord and mild cord edema, progressive tumor encroachment into the spinal canal at the C3 level. MRI of thoracic and lumbar spine done 12/30/2013 demonstrated reactive marrow changes versus early osseous tumor. There is bulky tumor with epidural and extraosseous extension at T12 in the right L1 neural foramina. He also had bulky sacral tumor with extraosseous extension including impingement of the exiting left S1 nerve root. Additionally, he has extensive pulmonary metastatic disease. He presented to [facility] ED 01/09/2014 with intractable pain and has reported no significant pain relief with home analgesia.   There is a significant barrier to discharge as patient claims his pain is not controlled and he demands having dilaudid IV for pain control. Additionally, he was seen and evaluated by psychiatry and determined he does not have capacity to make decisions about his medical treatment or living arrangements. During this hospital stay he has had I 131 scan which showed significant uptake and per Dr. Jana Hakim pt may benefit from being transferred to [facility] for dosimetry treatment and to observe for  potential lung toxicity. [facility] did not accept the transfer. This is a very challenging disposition because patient does not have capacity to make medical decisions or living arrangements so he is not safe to go home. Due to behavioral issues skilled nursing facility would not be an option. Patient has too many complicated medical issues and is not appropriate for behavioral health placement.   HPI/Subjective: No acute overnight events. Doing well this morning, ambulating in the hallways, complains of feet swelling.   Assessment/Plan:   Principal Problem:  Metastatic papillary thyroid cancer  Metastatic disease in lungs, bones, spinal cord. Multiple discussions with the pt held about the extent of the disease but pt still adamant about wanting the treatment/cure. Radiation oncology did not feel that further RT will improve the progression of the metastases and or that would provide quality of life. They have recommended palliative care to be involved for goals of care.  Palliative care team is assisting with pain management at this time. Pain seem sto be controlled with MS contin 60 mg PO Q 12 hours, robaxin 500 mg PO TID, dilaudid 2 mg IV every 2 hours PRN, dilaudid 4 mg PO every 4 hours PRN.  Recent TSH was high at 27 and I-131 scan showed significant uptake; plan  was transferred to [facility] for possible dosimetry however [facility] did not accept the transfer and has made recommendations for outpatient followup in 2-4 weeks.  Continue decadron 4 mg PO TID  Continue levothyroxine 125 mcg daily  Active Problems:  Behavioral issues/ Anxiety and paranoia  Patient has multiple behavioral issues all centered around his pain medications. Pt family reports  he has a long history of behavioral issues including anxiety, paranoia, aggression.  Pt was seen and evaluated by psych who recommended Haldol 1 mg PO TID, ativan 1 mg PO BID, Seroquel 25 mg PO BID and 50 mg at bedtime. Patient continues to have episodes of  paranoia and aggression.  Cancer related pain  Continue neck collar per neurosurgery recommendations.  Current pain regimen: MS contin 60 mg PO Q 12 hours, robaxin 500 mg PO TID, dilaudid 2 mg IV every 62ours PRN, dilaudid 4 mg PO every 4 hours PRN  Lower extremity edema  Concerning for lymphatic spread of his cancer or secondary to his low albumin.  Please note that order was placed for Lasix IV but patient refused the medication. Patient agreeable to Lasix on 6/7, ordered 20 mg.  Echocardiogram did not show evidence of CHF  Vascular duplex was negative for DVT  Ambulating in hallways.  Anemia of chronic disease  Secondary to malignancy  Hemoglobin is stable, no current indications for transfusion  DVT prophylaxis: Lovenox subQ   Code Status: DNR/DNI  Family Communication: plan of care discussed with the patient  Disposition Plan: remains inpatient; [facility] did not accept transfer; pt can follow up with endo outpt in 2-4 weeks   Consultants:  Medical oncology.  Radiation oncology Procedures:  None Antibiotics:  None  Objective: Filed Vitals:   01/18/14 0657 01/18/14 1404 01/18/14 2054 01/19/14 0510  BP: 151/90 145/93 144/89 132/82  Pulse: 111 106 102 91  Temp: 98.1 F (36.7 C) 97.9 F (36.6 C) 97.4 F (36.3 C) 98 F (36.7 C)  TempSrc: Oral Oral Oral Oral  Resp: 20 22 20 18   Height:      Weight:      SpO2: 98% 100% 97% 100%    Intake/Output Summary (Last 24 hours) at 01/19/14 0943 Last data filed at 01/19/14 0510  Gross per 24 hour  Intake    920 ml  Output      0 ml  Net    920 ml    Exam:   General:  Pt is alert, agitated, tangential thinking, speech fast; not wearing his neck collar today   Cardiovascular: Regular rate and rhythm, [name], no murmurs  Extremities: Pitting pedal edema +1-2, pulses DP and PT palpable bilaterally  Neuro: non focal  Data Reviewed: Basic Metabolic Panel:  Recent Labs Lab 01/15/14 0730  NA 139  K 3.9  CL 99  CO2 26    GLUCOSE 134*  BUN 14  CREATININE 0.96  CALCIUM 9.3   Liver Function Tests: No results found for this basename: AST, ALT, ALKPHOS, BILITOT, PROT, ALBUMIN,  in the last 168 hours No results found for this basename: LIPASE, AMYLASE,  in the last 168 hours No results found for this basename: AMMONIA,  in the last 168 hours CBC: No results found for this basename: WBC, NEUTROABS, HGB, HCT, MCV, PLT,  in the last 168 hours Cardiac Enzymes: No results found for this basename: CKTOTAL, CKMB, CKMBINDEX, TROPONINI,  in the last 168 hours BNP: No components found with this basename: POCBNP,  CBG: No results found for this basename: GLUCAP,  in the last 168 hours  No results found for this or any previous visit (from the past 240 hour(s)).   Studies: No results found.  Scheduled Meds: . dexamethasone  4 mg Oral TID  . docusate sodium  100 mg Oral BID  . enoxaparin (LOVENOX) injection  40 mg Subcutaneous Q24H  . furosemide  40 mg  Intravenous Once  . haloperidol  1 mg Oral TID  . levothyroxine  125 mcg Oral QAC breakfast  . LORazepam  1 mg Oral BID  . magnesium citrate  1 Bottle Oral Once  . methocarbamol  500 mg Oral TID  . morphine  60 mg Oral Q12H  . multivitamin with minerals  1 tablet Oral Daily  . pantoprazole  40 mg Oral Daily  . QUEtiapine  25 mg Oral BID  . QUEtiapine  50 mg Oral QHS  . senna  2 tablet Oral QHS    Costin M. Cruzita Lederer, MD [facility] [phone]  Time spent: 15 minutes  If 7PM-7AM, please contact night-coverage [email or web address] Password [email or web address]

## 2014-01-20 DIAGNOSIS — Z515 Encounter for palliative care: Secondary | ICD-10-CM

## 2014-01-20 LAB — BASIC METABOLIC PANEL
BUN: 13 mg/dL (ref 6–23)
CALCIUM: 9.3 mg/dL (ref 8.4–10.5)
CO2: 28 meq/L (ref 19–32)
CREATININE: 0.74 mg/dL (ref 0.50–1.35)
Chloride: 100 mEq/L (ref 96–112)
GFR calc Af Amer: >90 mL/min (ref >90)
GFR calc non Af Amer: >90 mL/min (ref >90)
GLUCOSE: 248 mg/dL — AB (ref 70–99)
Potassium: 4.2 mEq/L (ref 3.7–5.3)
Sodium: 139 mEq/L (ref 137–147)

## 2014-01-20 MED ORDER — LEVOTHYROXINE SODIUM 25 MCG PO TABS
25.0000 ug | ORAL_TABLET | Freq: Once | ORAL | Status: AC
Start: 2014-01-20 — End: 2014-01-20
  Administered 2014-01-20: 25 ug via ORAL
  Filled 2014-01-20 (×2): qty 1

## 2014-01-20 MED ORDER — LEVOTHYROXINE SODIUM 150 MCG PO TABS
150.0000 ug | ORAL_TABLET | Freq: Every day | ORAL | Status: DC
  Administered 2014-01-21 – 2014-01-27 (×7): 150 ug via ORAL
  Filled 2014-01-20 (×8): qty 1

## 2014-01-20 NOTE — Progress Notes (Signed)
PROGRESS NOTE  Frank Ward IOE:[id number] DOB: 1971-07-22 DOA: 01/07/2014 PCP: No PCP Per Patient  Brief narrative: 43 y.o. 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 Despite limited treatment he has received so far he has had progressive collapse of pathologic fractures at C3 and to a lesser extent C4 area, narrowing of the central canal to 9 mm at the level of C3 with mass effect on the spinal cord and mild cord edema, progressive tumor encroachment into the spinal canal at the C3 level. MRI of thoracic and lumbar spine done 12/30/2013 demonstrated reactive marrow changes versus early osseous tumor. There is bulky tumor with epidural and extraosseous extension at T12 in the right L1 neural foramina. He also had bulky sacral tumor with extraosseous extension including impingement of the exiting left S1 nerve root. Additionally, he has extensive pulmonary metastatic disease. He presented to [facility] ED 01/09/2014 with intractable pain and has reported no significant pain relief with home analgesia.   There is a significant barrier to discharge as patient claims his pain is not controlled and he demands having dilaudid IV for pain control. Additionally, he was seen and evaluated by psychiatry and determined he does not have capacity to make decisions about his medical treatment or living arrangements. During this hospital stay he has had I 131 scan which showed significant uptake and per Dr. Jana Hakim pt may benefit from being transferred to [facility] for dosimetry treatment and to observe for  potential lung toxicity. [facility] did not accept the transfer. This is a very challenging disposition because patient does not have capacity to make medical decisions or living arrangements so he is not safe to go home. Due to behavioral issues skilled nursing facility would not be an option. Patient has too many complicated medical issues and is not appropriate for behavioral health placement.  HPI/Subjective: No acute overnight events, sill has feet swelling  Assessment/Plan:   Principal Problem:  Metastatic papillary thyroid cancer  Metastatic disease in lungs, bones, spinal cord. Multiple discussions with the pt held about the extent of the disease but pt still adamant about wanting the treatment/cure. Radiation oncology did not feel that further RT will improve the progression of the metastases and or that would provide quality of life. They have recommended palliative care to be involved for goals of care.  Palliative care team is assisting with pain management at this time. Pain seem sto be controlled with MS contin 60 mg PO Q 12 hours, robaxin 500 mg PO TID, dilaudid 2 mg IV every 2 hours PRN, dilaudid 4 mg PO every 4 hours PRN.  Recent TSH was high at 27 and I-131 scan showed significant uptake; plan  was transferred to [facility] for possible dosimetry however [facility] did not accept the transfer and has made recommendations for outpatient followup in 2-4 weeks.  Continue decadron 4 mg PO TID  Continue levothyroxine 125 mcg daily  Dr. Jana Hakim from oncology following, recommending Endocrinology input as well. Discussed with Dr. Dwyane Dee who will weigh in today. Appreciate help, may be very difficult to coordinate treatment plan given behavioral issues.  Active Problems:  Behavioral issues/ Anxiety and paranoia  Patient has multiple behavioral issues all centered around his pain medications. Pt family reports he has a long history of behavioral issues including anxiety, paranoia, aggression.  Pt was seen and  evaluated by psych who recommended Haldol 1 mg PO TID, ativan 1 mg PO BID, Seroquel 25 mg PO BID and 50 mg at bedtime. Patient continues to have episodes of paranoia and aggression.  He does not have capacity to make medical decisions but can be verbally and physically abusive and is refusing care both inpatient and as an outpatient, having multiple missed appointments.  Cancer related pain  Continue neck collar per neurosurgery recommendations.  Current pain regimen: MS contin 60 mg PO Q 12 hours, robaxin 500 mg PO TID, dilaudid 2 mg IV every 6 hours PRN, dilaudid 4 mg PO every 4 hours PRN  Patient was changed back to dilaudid every 2 hours on Friday as had been previously every 6 hours. Verbally and physically threatening. Lower extremity edema  Concerning for lymphatic spread of his cancer or secondary to his low albumin.  Please note that order was placed for Lasix IV but patient refused the medication. Patient agreeable to Lasix on 6/7, ordered 20 mg.  Echocardiogram did not show evidence of CHF  Vascular duplex was negative for DVT  Ambulating in hallways.  Anemia of chronic disease  Secondary to malignancy  Hemoglobin is stable, no current indications for transfusion  DVT prophylaxis: Lovenox subQ   Code Status: DNR/DNI  Family Communication: plan of care discussed with the patient  Disposition Plan: remains inpatient; [facility] did not accept transfer; pt can follow up with endo outpt in 2-4 weeks   Consultants:  Medical oncology.  Radiation oncology Procedures:  None Antibiotics:  None  Objective: Filed Vitals:   01/18/14 2054 01/19/14 0510 01/19/14 1404 01/19/14 2033  BP: 144/89 132/82 159/85 142/95  Pulse: 102 91 118 107  Temp: 97.4 F (36.3 C) 98 F (36.7 C) 97.5 F (36.4 C) 98.6 F (37 C)  TempSrc: Oral Oral Oral Oral  Resp: 20 18 22 16   Height:      Weight:      SpO2: 97% 100% 96% 96%    Intake/Output Summary (Last 24 hours) at 01/20/14 1302 Last data filed at  01/20/14 0850  Gross per 24 hour  Intake   1300 ml  Output      0 ml  Net   1300 ml   Exam:  General:  Pt is alert, agitated, tangential thinking, speech fast; not wearing his neck collar today   Cardiovascular: Regular rate and rhythm, [name], no murmurs  Extremities: Pitting pedal edema +1-2, pulses DP and PT palpable bilaterally  Neuro: non focal  Data Reviewed: Basic Metabolic Panel:  Recent Labs Lab 01/15/14 0730 01/20/14 0402  NA 139 139  K 3.9 4.2  CL 99 100  CO2 26 28  GLUCOSE 134* 248*  BUN 14 13  CREATININE 0.96 0.74  CALCIUM 9.3 9.3   Liver Function Tests: No results found for this basename: AST, ALT, ALKPHOS, BILITOT, PROT, ALBUMIN,  in the last 168 hours No results found for this basename: LIPASE, AMYLASE,  in the last 168 hours No results found for this basename: AMMONIA,  in the last 168 hours CBC: No results found for this basename: WBC, NEUTROABS, HGB, HCT, MCV, PLT,  in the last 168 hours Cardiac Enzymes: No results found for this basename: CKTOTAL, CKMB, CKMBINDEX, TROPONINI,  in the  last 168 hours BNP: No components found with this basename: POCBNP,  CBG: No results found for this basename: GLUCAP,  in the last 168 hours  No results found for this or any previous visit (from the past 240 hour(s)).   Studies: No results found.  Scheduled Meds: . dexamethasone  4 mg Oral TID  . docusate sodium  100 mg Oral BID  . enoxaparin (LOVENOX) injection  40 mg Subcutaneous Q24H  . furosemide  40 mg Intravenous Once  . haloperidol  1 mg Oral TID  . levothyroxine  125 mcg Oral QAC breakfast  . LORazepam  1 mg Oral BID  . magnesium citrate  1 Bottle Oral Once  . methocarbamol  500 mg Oral TID  . morphine  60 mg Oral Q12H  . multivitamin with minerals  1 tablet Oral Daily  . pantoprazole  40 mg Oral Daily  . QUEtiapine  25 mg Oral BID  . QUEtiapine  50 mg Oral QHS  . senna  2 tablet Oral QHS    Rashied Corallo M. Cruzita Lederer, MD [facility] [phone]  Time spent: 65 minutes  If 7PM-7AM, please contact night-coverage [email or web address] Password [email or web address]

## 2014-01-20 NOTE — Progress Notes (Addendum)
Pt angry and upset it took so long for him to get his pain medicine. I reminded him he was in the restroom and told him to call me when he got out. Pt called me when he was ready and at that time I was with another pt. When I got to his room he was very angry he had to wait for his pain medicine. I tried to explain again why he had to wait but he only got more angry and stated I was arguing with him. He stated he was not going to take any medicine right now, and didn't want me as his nurse. Spoke with Jena Gauss, our director and he went to the pt room to speak with him and gave him his pain medicine. Pt stated he didn't want the pills now, that he was going to rest for awhile. While I was in the room, the pt was talking to the rail on his bed, saying " did you hear that,did you see that".

## 2014-01-20 NOTE — Progress Notes (Signed)
COURTESY NOTE: Events of past few days reviewed. At this point I suggest we request an endocrinology consult to get another opinion regarding whether or not we should proceed to treatment with I131 here, at what dose, and under what safeguards. Note I do not generally treat thyroid cancer and do not feel confident making these decisions in face of Nuclear medicine's reluctance to proceed with treatment (quite aside from the logistics of preventing radioactive contamination, which in this case likely would require deep sedation of patient and foley placement)  I will discuss with administration

## 2014-01-20 NOTE — Progress Notes (Signed)
Patient LP:[id number] Frank Ward      DOB: 04/26/1971      OXB:[id number]   Palliative Medicine Team at [facility] Progress Note  Subjective:  - patient is easily agitated with conversation, delusional and paranoid regarding medical care during this hospitalization, they are trying to give me "so many pills" "that will just knock me out for three days"  --gently offered information to explain attempts at converting his pain mnagement medications to oral agents only  - per nursing  patient is not taking his oral  medications consistently       Filed Vitals:   01/20/14 1510  BP: 148/95  Pulse: 115  Temp: 98.7 F (37.1 C)  Resp: 16   Physical exam:  General: chronically ill appearing, neck brace in place, pacing in the room CVS: tachycardic Lungs: CTA Ext: BLE +2  Edema, Skin:  Warm and dry Psych: agitated, paranoid   Assessment and plan:  DNR/DNI  Cancer related pain/R hip down outer aspect of leg and right neck and shoulder pain - -continue MS Contin to 60 mg po every 12 hrs --utilize Morphine IR 15 mg po every 3 hrs prn for breakthru pain  -Titrate over the next 48 hrs, increasing MS Contin dose  dependant on need for break thru medications  -Consider Dilaudid 2 mg IV every 6 hrs for severe pain unrelieved by above medication protocol  I am concerned with success of this strategy due to the patient's paranoia of medical staff and interventions.  He is fixated with use of IV medications only.  It is difficult to consider alternatives and adjunct therapies if a patient  does not have capacity to participate in the treatment plan.     Psychosis  -Seroquel as ordered and titrate as needed but if patient continues to refuse: -consider alternate route of administration, possible IM (if he is not getting the medications they cannot work)  -continue to collaborate with psych, hopefully getting him psychiatrically stable will open both treatment and housing options      Total time 35 minutes.  50% of time spent in counseling and coordination of care  Time in 1300 Time out [place] NP  Palliative Medicine Team Team Phone # [phone] Pager [phone]  Discussed with LCSW and Dr Cruzita Lederer

## 2014-01-20 NOTE — Plan of Care (Signed)
Problem: Phase I Progression Outcomes Goal: OOB as tolerated unless otherwise ordered Outcome: Progressing Ambulates in the hall.

## 2014-01-20 NOTE — Consult Note (Signed)
Reason for Consult: management of thyroid cancer   Frank Ward is an 43 y.o. male.  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 He presented to [facility] in [place] with these symptoms. Per patient, he had Xrays and scans of his back.   Per oncology clinic notes from Dr Grayland Ormond, he had a PET/CT on 01/03/2013 that showed widespread uptake of metastatic foci consistent with metastatic disease. There was a dominant mass in the Rt thyroid. There were multiple bone metastasis. At T9 there was a large destructive lesion invading the spinal canal. Thre were lytic hypermetabolic lesions at O25, L1, Rt sacral ala and iliac bone. Other lesions were noted in C3, C4, T1.  Previous treatment regimen:   - He received 6 fractions of RT to the spine at T9, and started on dexamethasone 44m. - He had thyroidectomy on 01/29/2013  Subsequent history:. - When seen in clinic by Dr FGrayland Ormondon 03/01/2013, his TSH was 35. - I 131 scan on 04/01/2013 (of note: the patient did not return for the 48 hour images after the radiotracer was given; results as per the 72 hour images): There was extensive abnormal tracer localization in the Rt pelvis, paraspinal to spine region and in left lung base; also in the head and neck region. Impression was of widespread metastatic disease which is I 131 avid. - as stated on the I 131 scan report, patient had a biopsy of a paraspinal mass at the T 9 level. It was referring to previous studies mentioning multinodular tumor foci in the Rt lobe of thyroid, largest was 4cm, Bx showing columnar variant of papillary thyroid cancer and extensive  vascular invasion. It also made reference to pulmonary nodules, largest of 2.4cm in LUL and 2cm in LLL.  Recent results of whole Body iodine scan: Iodine avid metastasis are identified within both lungs and right  pelvis. There is also residual iodine avid tumor and/or thyroid  tissue within the thyroid bed.  HYPOTHYROIDISM: Appears that he was taking 100 mcg earlier this year and at some point the dose was increased to 125 mcg.  Lab Results  Component Value Date   FREET4 1.00 09/03/2013   TSH 27.120* 01/10/2014   TSH 40.540* 01/07/2014   TSH 14.051* 09/03/2013       Past Medical History  Diagnosis Date  . Cancer     Thyroid  . Thyroid disease   . Hypertension   . Pneumonia     Past Surgical History  Procedure Laterality Date  . Tendon repair    . Thyroidectomy      Family History  Problem Relation Age of Onset  . Diabetes Mother   . Diabetes Brother     Social History:  reports that he quit smoking about 2 years ago. His smoking use included Cigarettes. He smoked 0.00 packs per day for 10 years. He has never used smokeless tobacco. He reports that he drinks about 4.8 ounces of alcohol per week. He reports that he uses illicit drugs (Marijuana).  Allergies:  Allergies  Allergen Reactions  . No Known Allergies  Medications:  . dexamethasone  4 mg Oral TID  . docusate sodium  100 mg Oral BID  . enoxaparin (LOVENOX) injection  40 mg Subcutaneous Q24H  . furosemide  40 mg Intravenous Once  . haloperidol  1 mg Oral TID  . levothyroxine  125 mcg Oral QAC breakfast  . LORazepam  1 mg Oral BID  . magnesium citrate  1 Bottle Oral Once  . methocarbamol  500 mg Oral TID  . morphine  60 mg Oral Q12H  . multivitamin with minerals  1 tablet Oral Daily  . pantoprazole  40 mg Oral Daily  . QUEtiapine  25 mg Oral BID  . QUEtiapine  50 mg Oral QHS  . senna  2 tablet Oral QHS      Results for orders placed during the hospital encounter of 01/07/14 (from the past 48  hour(s))  BASIC METABOLIC PANEL     Status: Abnormal   Collection Time    01/20/14  4:02 AM      Result Value Ref Range   Sodium 139  137 - 147 mEq/L   Potassium 4.2  3.7 - 5.3 mEq/L   Chloride 100  96 - 112 mEq/L   CO2 28  19 - 32 mEq/L   Glucose, Bld 248 (*) 70 - 99 mg/dL   BUN 13  6 - 23 mg/dL   Creatinine, Ser 0.74  0.50 - 1.35 mg/dL   Calcium 9.3  8.4 - 10.5 mg/dL   GFR calc non Af Amer >90  >90 mL/min   GFR calc Af Amer >90  >90 mL/min   Comment: (NOTE)     The eGFR has been calculated using the CKD EPI equation.     This calculation has not been validated in all clinical situations.     eGFR's persistently <90 mL/min signify possible Chronic Kidney     Disease.    No results found.  Review of Systems  Constitutional: Negative for weight loss and malaise/fatigue.  Eyes: Negative for blurred vision.  Respiratory: Negative for shortness of breath.   Cardiovascular: Positive for leg swelling.  Gastrointestinal: Negative for abdominal pain.  Musculoskeletal: Positive for joint pain and neck pain.  Neurological: Negative for headaches.   Blood pressure 148/95, pulse 115, temperature 98.7 F (37.1 C), temperature source Oral, resp. rate 16, height '6\' 4"'  (1.93 m), weight 189 lb 13.1 oz (86.1 kg), SpO2 94.00%. Physical Exam  Vitals reviewed. Constitutional: He appears well-developed and well-nourished.  Neck:  Unable to do exam because of cervical collar  Musculoskeletal: He exhibits edema.  Skin:  He has erythema and swelling of the lower legs especially on the right side. There is a little serous exudate on the distal foot present   Psychiatric: His mood appears anxious.  He has some tangential thinking and unable to answer questions appropriately    Assessment/Plan:  Metastatic thyroid cancer which has already been treated with high dose radioactive iodine previously but apparently without any clinical response  Currently his I-131 scan  results indicate he still  has extensive  uptake in his metastatic lesions in his lungs and pelvis.   Since the tumor appears to be picking up I-131 it can  be treated with radioactive iodine again His radioactive iodine treatment will need to be done after Thyrogen stimulation which will be done in 2 injections We'll need to coordinate this with nuclear medicine and will be done after discharge Will need to try to have him on a low iodine  diet before doing this and not clear how his compliance will be on this  However since he has resistant tumor he may be also a candidate for adding a  kinase inhibitor. This is especially important because apparently the tumor is visible on the I-131 scan at the thoracic lumbar junction that is seen on his radiological studies However for this we'll need to make sure this is available on his insurance as well as he will need to be compliant with monitoring and regular followup Number also recommend continuing steroids at the time of his I-131 treatment to prevent local swelling when he gets the I-131   HYPOTHYROIDISM: This has been inadequately treated with on the TSH levels in the past been high and the last one being 27 Also because of his  marked and persistent metastatic disease he needs better management of  his post ablative hypothyroidism and his TSH level needs to be less than 1.0 Would recommend that we increase his dosage to 163mg  Leg edema and possible cellulitis: Deferred management to internal medicine  AElayne Snare6/03/2014, 3:17 PM

## 2014-01-21 DIAGNOSIS — C7952 Secondary malignant neoplasm of bone marrow: Secondary | ICD-10-CM

## 2014-01-21 DIAGNOSIS — C7951 Secondary malignant neoplasm of bone: Secondary | ICD-10-CM

## 2014-01-21 MED ORDER — HYDROMORPHONE HCL PF 2 MG/ML IJ SOLN
2.0000 mg | Freq: Four times a day (QID) | INTRAMUSCULAR | Status: DC | PRN
  Administered 2014-01-21 – 2014-01-23 (×7): 2 mg via INTRAVENOUS
  Filled 2014-01-21 (×7): qty 1

## 2014-01-21 MED ORDER — ARIPIPRAZOLE 5 MG PO TABS
5.0000 mg | ORAL_TABLET | Freq: Every day | ORAL | Status: DC
  Filled 2014-01-21: qty 1

## 2014-01-21 MED ORDER — ARIPIPRAZOLE 5 MG PO TABS
5.0000 mg | ORAL_TABLET | Freq: Two times a day (BID) | ORAL | Status: DC
  Administered 2014-01-21 (×2): 5 mg via ORAL
  Filled 2014-01-21 (×3): qty 1

## 2014-01-21 MED ORDER — HYDROMORPHONE HCL 2 MG PO TABS
2.0000 mg | ORAL_TABLET | ORAL | Status: DC | PRN
  Administered 2014-01-21 – 2014-01-23 (×10): 2 mg via ORAL
  Filled 2014-01-21 (×10): qty 1

## 2014-01-21 MED ORDER — MORPHINE SULFATE 15 MG PO TABS
15.0000 mg | ORAL_TABLET | Freq: Four times a day (QID) | ORAL | Status: DC | PRN
  Administered 2014-01-21 – 2014-01-22 (×3): 15 mg via ORAL
  Filled 2014-01-21 (×3): qty 1

## 2014-01-21 MED ORDER — VITAMINS A & D EX OINT
TOPICAL_OINTMENT | CUTANEOUS | Status: AC
  Administered 2014-01-21: 23:00:00
  Filled 2014-01-21: qty 5

## 2014-01-21 NOTE — Progress Notes (Signed)
Follow-up visit w/pt since move. He said he was ok, just tired from meds. Pt was sitting up in chair by the sink. He said he was ok and thankful for Chaplain stopping by. Ernest Haber Chaplain  01/21/14 1300  Clinical Encounter Type  Visited With Patient

## 2014-01-21 NOTE — Progress Notes (Signed)
PROGRESS NOTE  Frank Ward YTK:[id number] DOB: 1970/10/04 DOA: 01/07/2014 PCP: No PCP Per Patient  Brief narrative: 43 y.o. 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 Despite limited treatment he has received so far he has had progressive collapse of pathologic fractures at C3 and to a lesser extent C4 area, narrowing of the central canal to 9 mm at the level of C3 with mass effect on the spinal cord and mild cord edema, progressive tumor encroachment into the spinal canal at the C3 level. MRI of thoracic and lumbar spine done 12/30/2013 demonstrated reactive marrow changes versus early osseous tumor. There is bulky tumor with epidural and extraosseous extension at T12 in the right L1 neural foramina. He also had bulky sacral tumor with extraosseous extension including impingement of the exiting left S1 nerve root. Additionally, he has extensive pulmonary metastatic disease. He presented to [facility] ED 01/09/2014 with intractable pain and has reported no significant pain relief with home analgesia.   There is a significant barrier to discharge as patient claims his pain is not controlled and he demands having dilaudid IV for pain control. Additionally, he was seen and evaluated by psychiatry and determined he does not have capacity to make decisions about his medical treatment or living arrangements. During this hospital stay he has had I 131 scan which showed significant uptake and per Dr. Jana Hakim pt may benefit from being transferred to [facility] for dosimetry treatment and to observe for  potential lung toxicity. [facility] did not accept the transfer. Due to behavioral issues skilled nursing facility would not be an option. Patient has too many complicated medical issues and is not appropriate for behavioral health placement.  Consulted endocrinology here for options regarding further treatment, appreciate Dr. Dwyane Dee input and expertise.   HPI/Subjective: Patient extremely upset today about his pain regimen and despite attempts at redirecting him towards his thyroid cancer treatment planning such as outpatient follow up, radiation therapy, iodine ablation and so on, his main focus is his pain control.   Assessment/Plan:   Metastatic papillary thyroid cancer  Metastatic disease in lungs, bones, spinal cord. Multiple discussions with the pt held about the extent of the disease but pt still adamant about wanting the treatment/cure. Radiation oncology did not feel that further RT will improve the progression of the metastases and or that would provide quality of life. They have recommended palliative care to be involved for goals of care.   Recent TSH was high at 27 and I-131 scan showed significant uptake; plan was transferred to [facility] for possible dosimetry however [facility] did not accept the transfer and has made recommendations for outpatient followup in 2-4 weeks.  Continue decadron 4 mg PO TID  Continue levothyroxine 150 mcg daily  Dr. Jana Hakim from Oncology following Dr. Dwyane Dee from Endocrinology saw and evaluated patient, could benefit from I-131 after focal XRT of TL area, to discuss with Dr. [name].  Behavioral issues/ Anxiety and paranoia  Patient has multiple behavioral issues all centered around his pain medications. Pt family reports he has a long history of behavioral issues including anxiety, paranoia and aggression.  Pt was seen and evaluated by psych who recommended Haldol 1 mg PO TID, ativan 1 mg PO BID, Seroquel 25 mg PO BID and 50 mg at bedtime. Patient continues to have episodes  of paranoia and aggression. He was re-evaluated by psychiatry and Abilify was started on 6/9. He does not have capacity to make medical decisions but can be verbally and physically abusive and is refusing care both inpatient and as an outpatient, having multiple missed appointments which makes his long term treatment difficult.   Cancer related pain  Continue neck collar per neurosurgery recommendations.   Palliative care team is assisting with pain management at this time. Current regimen is MS contin 60 mg PO Q 12 hours, robaxin 500 mg PO TID, dilaudid 2 mg IV every 6 hours PRN, dilaudid 2 mg PO every 2 hours PRN and MSIR 15 mg every 6 hours as needed. Patient has been refusing so far his oral medications asking almost exclusively for IV pain medications.  Patient changed to IV dilaudid every 6 hours on Friday, then back to every 2 hours after complaining of increasing pain. I addressed this with palliative and will try to implement transitioning from IV to a full oral regimen. Discussed this with patient today 6/9; Jena Gauss RN and Baker Pierini RN present to the discussion as well. Patient became extremely upset with the change and is threatening to sue the hospital. Part of his pain is in his bilateral feet due to swelling however he has been intermittently refusing his IV Lasix to help with the swelling.   Lower extremity edema  Concerning for lymphatic spread of his cancer or secondary to his low albumin.  Please note that Lasix has been ordered x 3 however patient agreed to take it only once.  Echocardiogram did not show evidence of CHF  Vascular duplex was negative for DVT  Ambulating in hallways.   Anemia of chronic disease  Secondary to malignancy  Hemoglobin is stable, no current indications for transfusion  DVT prophylaxis: Lovenox subQ - patient refusing  Code Status: DNR/DNI  Family Communication: plan of care discussed with the patient  Disposition Plan: remains  inpatient; [facility] did not accept transfer; pt can follow up with endo outpt in 2-4 weeks   Consultants:  Medical oncology.  Radiation oncology Endocrinology   Procedures:  None  Antibiotics:  None  Objective: Filed Vitals:   01/19/14 2033 01/20/14 1510 01/20/14 2135 01/21/14 0502  BP: 142/95 148/95 152/88 149/91  Pulse: 107 115 115 104  Temp: 98.6 F (37 C) 98.7 F (37.1 C) 98.1 F (36.7 C) 98.7 F (37.1 C)  TempSrc: Oral Oral Oral Oral  Resp: 16 16 18 16   Height:      Weight:      SpO2: 96% 94% 97% 97%    Intake/Output Summary (Last 24 hours) at 01/21/14 1155 Last data filed at 01/21/14 0502  Gross per 24 hour  Intake    840 ml  Output      0 ml  Net    840 ml   Exam:  General:  Pt is alert, agitated, tangential thinking, speech fast. Rest of exam deferred today due to agitation  Data Reviewed: Basic Metabolic Panel:  Recent Labs Lab 01/15/14 0730 01/20/14 0402  NA 139 139  K 3.9 4.2  CL 99 100  CO2 26 28  GLUCOSE 134* 248*  BUN 14 13  CREATININE 0.96 0.74  CALCIUM 9.3 9.3     Studies: No results found.  Scheduled Meds: . dexamethasone  4 mg Oral TID  . docusate sodium  100 mg Oral BID  . enoxaparin (LOVENOX) injection  40 mg Subcutaneous Q24H  . furosemide  40 mg Intravenous Once  . haloperidol  1 mg Oral TID  . levothyroxine  125 mcg Oral QAC breakfast  . LORazepam  1 mg Oral BID  . magnesium citrate  1 Bottle Oral Once  . methocarbamol  500 mg Oral TID  . morphine  60 mg Oral Q12H  . multivitamin with minerals  1 tablet Oral Daily  . pantoprazole  40 mg Oral Daily  . QUEtiapine  25 mg Oral BID  . QUEtiapine  50 mg Oral QHS  . senna  2 tablet Oral QHS   Costin M. Cruzita Lederer, MD [facility] [phone]  Time spent: 35 minutes  If 7PM-7AM, please contact night-coverage [email or web address] Password [email or web address]

## 2014-01-21 NOTE — Progress Notes (Signed)
Patient ID: Frank Ward, male   DOB: 11/25/1970, 43 y.o.   MRN: [id number]  Subjective: Treatment of thyroid cancer  The case was discussed with Dr. Clovis Riley, nuclear medicine. He is not comfortable giving the Thyrogen prior to I-131 treatment because of his tumor in the epidural thoracolumbar area and potential for short-term tumor growth and swelling Currently patient still is hypothyroid with the current regimen of levothyroxine  Lab Results  Component Value Date   TSH 27.120* 01/10/2014     Objective: Vital signs in last 24 hours: Temp:  [98.1 F (36.7 C)-98.7 F (37.1 C)] 98.7 F (37.1 C) (06/09 0502) Pulse Rate:  [104-115] 104 (06/09 0502) Resp:  [16-18] 16 (06/09 0502) BP: (148-152)/(88-95) 149/91 mmHg (06/09 0502) SpO2:  [94 %-97 %] 97 % (06/09 0502)   Lab Results: No results found for this basename: WBC, HGB, HCT, PLT,  in the last 72 hours BMET  Recent Labs  01/20/14 0402  NA 139  K 4.2  CL 100  CO2 28  GLUCOSE 248*  BUN 13  CREATININE 0.74  CALCIUM 9.3    Studies/Results: No results found.  Medications:  Scheduled: . ARIPiprazole  5 mg Oral Daily  . dexamethasone  4 mg Oral TID  . docusate sodium  100 mg Oral BID  . enoxaparin (LOVENOX) injection  40 mg Subcutaneous Q24H  . furosemide  40 mg Intravenous Once  . haloperidol  1 mg Oral TID  . levothyroxine  150 mcg Oral QAC breakfast  . LORazepam  1 mg Oral BID  . magnesium citrate  1 Bottle Oral Once  . methocarbamol  500 mg Oral TID  . morphine  60 mg Oral Q12H  . multivitamin with minerals  1 tablet Oral Daily  . pantoprazole  40 mg Oral Daily  . QUEtiapine  25 mg Oral BID  . QUEtiapine  50 mg Oral QHS  . senna  2 tablet Oral QHS    Assessment/Plan:  Will need to discuss  prior focal radiation treatment of his tumor in the thoracolumbar area with Dr. [name] to reduce tumor size before doing I-131 treatment. He will be back in the office tomorrow  Further management of this  patient's I-131 treatment can be done as an outpatient after discharge  Elayne Snare 01/21/2014, 12:31 PM

## 2014-01-21 NOTE — Progress Notes (Signed)
Pt was up and walking around room today. He pointed out his feet were swollen. He began complaining about his care again; that he is going to sue; wanting to know how to contact [employer]. When asked to help, I told him emphatically that I will not help him. Then he said I see they've talked you to their side. You have to be a team. I told pt we all care for him and he could address any concerns with his nurse. I asked if there was anything else I could do for him, he said just prayer.  Had prayer w/pt. Pt was appreciative for prayer and visit.  Pt's nurse arrived to move him; him he said he wasn't ready to come back in 10 min. Pt appeared upset saying they were just springing move upon him. I reminded pt all pts are being moved and he said, oh I know that. Provided comforting presence to pt and empathic listening, and prayer. Ernest Haber Chaplain  01/21/14 0900  Clinical Encounter Type  Visited With Patient

## 2014-01-21 NOTE — Progress Notes (Signed)
CSW continuing to follow.   CSW spoke with MD who discussed that plan is to transition pt from IV medications to full oral regimen.  Psychiatry following to assist with medication management for pt mood and psychosis. Per psych MD note, pt continues to not have capacity to make decisions.   CSW spoke with [facility] who has been communicating with [name] regarding arranging family meeting to discuss pt plan of care and disposition planning. Awaiting for further communication from [name] to determine when family will be available to meet. Per RNCM, [name] agreeable to medical team contacting her if pt refusing care and pt aunt will have family member call or come see pt to assist as needed.  Pt has multiple barriers to discharge including pain management and behavioral issues. Pt has been faxed out to ALFs in [place], [place] and [facility] and no facility is able to offer pt a bed or meet pt needs.   CSW to continue to follow to assist with pt disposition planning.  Alison Murray, MSW, [place] [employer] [phone]

## 2014-01-21 NOTE — Consult Note (Signed)
[facility] Face-to-Face Psychiatry Consult   Reason for Consult: Confusion and competency evaluation Referring Physician: Caren Griffins, MD  Frank Ward is an 43 y.o. male. Total Time spent with patient: 1 hour  Assessment: AXIS I:  Psychotic Disorder NOS AXIS II:  Deferred AXIS III:   Past Medical History  Diagnosis Date  . Cancer     Thyroid  . Thyroid disease   . Hypertension   . Pneumonia    AXIS IV:  housing problems and problems related to social environment AXIS V:  41-50 serious symptoms  Plan:  Discussed with the Frank Karlyne Greenspan, MD and [place], [place] Based on my evaluation patient does not meet capacity to make his own medical decisions and living arrangements. Patient is in agreement regarding assisted living facility placement if needed.  Recommend Abilify 5 mg PO BID (may substitute with liquid if needed) Patient is not a candidate for forcing medications at this time Contact psychiatric social services regarding medical care power of attorney Appreciate psychiatric consultation and we'll follow up as clinically required May contact [phone] if needed further assistance Patient does not meet criteria for psychiatric inpatient admission.  Subjective:   Frank Ward is a 43 y.o. male is seen for psychiatric consultation follow up as he has not complaint twith his medication treatment and treatment for malignacy and does not follow through recommendation given. Patient appeared with hypomanic symptoms, tangential and circumstantial thought process and has difficulty with agreeing with medication recommendation from physician and he states that she can take [facility] if recommended. Patient was educated about medication Abilify and encouraged to learn more about it and will start 5 mg PO BID for mood and psychosis.   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 Patient appeared talking on phone while entered  room and he is some what more cooperative than my last visit. He continue to be reluctant to take medication with concern that he will be hooked up with addiction. He was given assurance that the medication recommend by psychiatry does not have addiction potential. Patient has been staying with his mother and previously with his sister. Patient denies history of mental illness or family mental illness. Patient does not like doctors and he does not have much time to live. His thoughts are confused, disorganized and paranoid. He denies  psychosis or suicidal ideation/homicidal ideation. Reportedly he was working until 6 months ago in [employer] and stated he has diploma in high school.   HPI Elements:  Location:  global. Quality:  severe. Severity:  severe. Timing:  several months. Duration:  months. Context:  brain mets.  Past Psychiatric History: Past Medical History  Diagnosis Date  . Cancer     Thyroid  . Thyroid disease   . Hypertension   . Pneumonia     reports that he quit smoking about 2 years ago. His smoking use included Cigarettes. He smoked 0.00 packs per day for 10 years. He has never used smokeless tobacco. He reports that he drinks about 4.8 ounces of alcohol per week. He reports that he uses illicit drugs (Marijuana). Family History  Problem Relation Age of Onset  . Diabetes Mother   . Diabetes Brother      Living Arrangements: Parent (lives with mother)   Abuse/Neglect [facility]) Physical Abuse: Denies Verbal Abuse: Denies Sexual Abuse: Denies Allergies:   Allergies  Allergen Reactions  . No Known Allergies      Objective: Blood  pressure 149/91, pulse 104, temperature 98.7 F (37.1 C), temperature source Oral, resp. rate 16, height '6\' 4"'  (1.93 m), weight 86.1 kg (189 lb 13.1 oz), SpO2 97.00%.Body mass index is 23.11 kg/(m^2). Results for orders placed during the hospital encounter of 01/07/14 (from the past 72 hour(s))  BASIC METABOLIC PANEL     Status:  Abnormal   Collection Time    01/20/14  4:02 AM      Result Value Ref Range   Sodium 139  137 - 147 mEq/L   Potassium 4.2  3.7 - 5.3 mEq/L   Chloride 100  96 - 112 mEq/L   CO2 28  19 - 32 mEq/L   Glucose, Bld 248 (*) 70 - 99 mg/dL   BUN 13  6 - 23 mg/dL   Creatinine, Ser 0.74  0.50 - 1.35 mg/dL   Calcium 9.3  8.4 - 10.5 mg/dL   GFR calc non Af Amer >90  >90 mL/min   GFR calc Af Amer >90  >90 mL/min   Comment: (NOTE)     The eGFR has been calculated using the CKD EPI equation.     This calculation has not been validated in all clinical situations.     eGFR's persistently <90 mL/min signify possible Chronic Kidney     Disease.   Labs are reviewed and are pertinent for ongoing medical issues.  Current Facility-Administered Medications  Medication Dose Route Frequency Provider Last Rate Last Dose  . ARIPiprazole (ABILIFY) tablet 5 mg  5 mg Oral Daily Frank Karlyne Greenspan, MD      . bisacodyl (DULCOLAX) suppository 10 mg  10 mg Rectal Daily PRN Janece Canterbury, MD      . dexamethasone (DECADRON) tablet 4 mg  4 mg Oral TID Janece Canterbury, MD   4 mg at 01/21/14 1036  . docusate sodium (COLACE) capsule 100 mg  100 mg Oral BID Janece Canterbury, MD   100 mg at 01/20/14 2232  . enoxaparin (LOVENOX) injection 40 mg  40 mg Subcutaneous Q24H Janece Canterbury, MD   40 mg at 01/13/14 1057  . furosemide (LASIX) injection 40 mg  40 mg Intravenous Once Verlee Monte, MD      . haloperidol (HALDOL) tablet 1 mg  1 mg Oral TID Chauncey Cruel, MD   1 mg at 01/21/14 1036  . HYDROmorphone (DILAUDID) injection 2 mg  2 mg Intravenous Q6H PRN Frank Karlyne Greenspan, MD      . HYDROmorphone (DILAUDID) tablet 2 mg  2 mg Oral Q2H PRN Frank Karlyne Greenspan, MD      . levothyroxine (SYNTHROID, LEVOTHROID) tablet 150 mcg  150 mcg Oral QAC breakfast Elayne Snare, MD   150 mcg at 01/21/14 0811  . LORazepam (ATIVAN) tablet 1 mg  1 mg Oral BID Janece Canterbury, MD   1 mg at 01/21/14 1037  . LORazepam (ATIVAN) tablet 1 mg  1 mg Oral Q4H  PRN Janece Canterbury, MD   1 mg at 01/20/14 0150  . magnesium citrate solution 1 Bottle  1 Bottle Oral Once Verlee Monte, MD      . methocarbamol (ROBAXIN) tablet 500 mg  500 mg Oral TID Janece Canterbury, MD   500 mg at 01/21/14 1036  . morphine (MS CONTIN) 12 hr tablet 60 mg  60 mg Oral Q12H Knox Royalty, NP   60 mg at 01/21/14 1036  . morphine (MSIR) tablet 15 mg  15 mg Oral Q6H PRN Frank Karlyne Greenspan, MD      .  multivitamin with minerals tablet 1 tablet  1 tablet Oral Daily Janece Canterbury, MD   1 tablet at 01/21/14 1036  . ondansetron (ZOFRAN) tablet 4 mg  4 mg Oral Q6H PRN Janece Canterbury, MD       Or  . ondansetron (ZOFRAN) injection 4 mg  4 mg Intravenous Q6H PRN Janece Canterbury, MD      . pantoprazole (PROTONIX) EC tablet 40 mg  40 mg Oral Daily Janece Canterbury, MD   40 mg at 01/21/14 1036  . polyethylene glycol (MIRALAX / GLYCOLAX) packet 17 g  17 g Oral Daily PRN Janece Canterbury, MD   17 g at 01/11/14 1004  . QUEtiapine (SEROQUEL) tablet 25 mg  25 mg Oral BID Levonne Spiller, MD   25 mg at 01/21/14 1036  . QUEtiapine (SEROQUEL) tablet 50 mg  50 mg Oral QHS Levonne Spiller, MD   50 mg at 01/20/14 2231  . senna (SENOKOT) tablet 17.2 mg  2 tablet Oral QHS Janece Canterbury, MD   17.2 mg at 01/20/14 2232    Psychiatric Specialty Exam:     Blood pressure 149/91, pulse 104, temperature 98.7 F (37.1 C), temperature source Oral, resp. rate 16, height '6\' 4"'  (1.93 m), weight 86.1 kg (189 lb 13.1 oz), SpO2 97.00%.Body mass index is 23.11 kg/(m^2).  General Appearance: Disheveled  Eye Contact::  Poor  Speech:  Pressured  Volume:  Increased  Mood:  Angry, Anxious and Irritable  Affect:  Labile  Thought Process:  Circumstantial, Disorganized and Loose  Orientation:  Full (Time, Place, and Person)  Thought Content:  Paranoid Ideation and Rumination  Suicidal Thoughts:  No  Homicidal Thoughts:  No  Memory:  Immediate;   Fair Recent;   Fair Remote;   Fair  Judgement:  Impaired  Insight:  Lacking   Psychomotor Activity:  Increased and Restlessness  Concentration:  Poor  Recall:  Poor  Fund of Knowledge:Fair  Language: Good  Akathisia:  No  Handed:  Right  AIMS (if indicated):     Assets:  Desire for Improvement  Sleep:      Musculoskeletal: Strength & Muscle Tone: within normal limits Gait & Station: normal Patient leans: N/A  Treatment Plan Summary: Daily contact with patient to assess and evaluate symptoms and progress in treatment Medication management  Parke Simmers Anzley Dibbern 01/21/2014 12:15 PM

## 2014-01-21 NOTE — Progress Notes (Signed)
Clinical Social Work Department CLINICAL SOCIAL WORK PSYCHIATRY SERVICE LINE ASSESSMENT 01/21/2014  Patient:  Frank Ward [facility]  Account:  [id number]  [place] Date:  01/07/2014  Clinical Social Worker:  Sindy Messing, LCSW  Date/Time:  01/21/2014 11:45 AM Referred by:  Physician  Date referred:  01/21/2014 Reason for Referral  Psychosocial assessment   Presenting Symptoms/Problems (In the person's/family's own words):   Psych consulted for medication management. Patient was discussed during [place] LOS meeting and Dr. Reynaldo Minium requested that psych MD make recommendations for long acting injection medication.   Abuse/Neglect/Trauma History (check all that apply)  Denies history   Abuse/Neglect/Trauma Comments:   Psychiatric History (check all that apply)  Outpatient treatment   Psychiatric medications:  Abilify 5 mg  Haldol 1 mg  Ativan 1 mg  Seroquel 25 and 50 mg   Current Mental Health Hospitalizations/Previous Mental Health History:   Patient reports he has been diagnosed with depression and anxiety due to medical concerns. Patient reports that primary doctor prescribes medication but that he is not interested in any further medication for MH concerns.   Current provider:   PCP   Place and Date:   [place], [place]   Current Medications:   Scheduled Meds:      . ARIPiprazole  5 mg Oral BID  . dexamethasone  4 mg Oral TID  . docusate sodium  100 mg Oral BID  . enoxaparin (LOVENOX) injection  40 mg Subcutaneous Q24H  . furosemide  40 mg Intravenous Once  . haloperidol  1 mg Oral TID  . levothyroxine  150 mcg Oral QAC breakfast  . LORazepam  1 mg Oral BID  . magnesium citrate  1 Bottle Oral Once  . methocarbamol  500 mg Oral TID  . morphine  60 mg Oral Q12H  . multivitamin with minerals  1 tablet Oral Daily  . pantoprazole  40 mg Oral Daily  . QUEtiapine  25 mg Oral BID  . QUEtiapine  50 mg Oral QHS  . senna  2 tablet Oral QHS        Continuous Infusions:      PRN  Meds:.bisacodyl, HYDROmorphone (DILAUDID) injection, HYDROmorphone, LORazepam, morphine, ondansetron (ZOFRAN) IV, ondansetron, polyethylene glycol       Previous Impatient Admission/Date/Reason:   None reported   Emotional Health / Current Symptoms    Suicide/Self Harm  None reported   Suicide attempt in the past:   Patient denies any previous attempts. Patient denies any SI or HI.   Other harmful behavior:   None reported   Psychotic/Dissociative Symptoms  Delusional  Paranoia   Other Psychotic/Dissociative Symptoms:   Patient denies any psychotic features. Per chart review, it has been documented that patient is delusional about care and paranoid re: staff at times.    Attention/Behavioral Symptoms  Inattentive   Other Attention / Behavioral Symptoms:   Patient easily distracted and gives tangential information throughout assessment.    Cognitive Impairment  Within Normal Limits   Other Cognitive Impairment:   Patient alert and oriented.    Mood and Adjustment  Flat    Stress, Anxiety, Trauma, Any Recent Loss/Stressor  Other - See comment   Anxiety (frequency):   Patient admits some anxiety due to medical concerns.   Phobia (specify):   N/A   Compulsive behavior (specify):   N/A   Obsessive behavior (specify):   N/A   Other:   Patient reports stress due to cancer diagnosis.   Substance Abuse/Use  History of substance use  SBIRT completed (please refer for detailed history):  N  Self-reported substance use:   Patient reports he has used marijuana in the past to boost his appetite and to help manage pain. Patient denies any current use. Patient declines to complete SBIRT.   Urinary Drug Screen Completed:  N Alcohol level:   N/A    Environmental/Housing/Living Arrangement  With Biological Parent(s)   Who is in the home:   Mom   Emergency contact:  Mary-mom   Financial  Medicaid   Patient's Strengths and Goals (patient's own words):    Patient reports he will live with mom at [place]. Patient reports he is motivated to get better in order to start working and care for himself again.   Clinical Social Worker's Interpretive Summary:   CSW received referral in order to complete psychosocial assessment. CSW reviewed chart and met with patient at bedside with psych MD. CSW introduced myself and explained role. Patient is familiar with CSW from previous hospitalization and apologized for cussing at [place] on previous admission.    Patient spoke about his past medical concerns. Patient reports that a doctor found a mass in his throat but never scheduled any follow up. Patient hurt his back when working and it was discovered that he had cancer. Patient reports that thyroid was removed and now he has cancer throughout his body. Patient spoke about medications that he takes and reports that he was diagnosed with depression and anxiety due to cancer diagnosis. Patient reports that he will still remain strong and knows that he will get better so he can work again.    Patient spoke about not wanting to take any more medications. Patient denies any psychotic features and reports that he does not need medications for depression because he does not want to "get hooked on" any medications. Psych MD explained that there are non-addictive medications but patient continues to refuse. Patient spoke about how marijuana was effective for pain and appetite but reports that he knows it is legal so he stopped using. Patient denies and further substance use.    Patient appears suspicious at times re: his medications. Patient reports that RN gave him 10 pills and he was worried that she was just trying to make him sleepy. Patient reports that he likes to be in control of his medications and what he "puts in his body." Psych MD encouraged patient to reconsider medication options and offered medication suggestions if patient is interested.    Psych MD to make medication  recommendations. Unit CSW to assist with disposition planning. Psych CSW is signing off but available if further needs arise.   Disposition:  Psych Clinical Social Worker signing off   [place], [place] [phone]

## 2014-01-22 MED ORDER — VITAMINS A & D EX OINT
TOPICAL_OINTMENT | CUTANEOUS | Status: AC
  Administered 2014-01-22: 5
  Filled 2014-01-22: qty 5

## 2014-01-22 MED ORDER — HALOPERIDOL 2 MG PO TABS
2.0000 mg | ORAL_TABLET | Freq: Three times a day (TID) | ORAL | Status: DC
  Administered 2014-01-22 – 2014-01-24 (×6): 2 mg via ORAL
  Filled 2014-01-22 (×8): qty 1

## 2014-01-22 MED ORDER — ARIPIPRAZOLE 10 MG PO TABS
10.0000 mg | ORAL_TABLET | Freq: Two times a day (BID) | ORAL | Status: DC
  Administered 2014-01-22: 10 mg via ORAL
  Filled 2014-01-22 (×5): qty 1

## 2014-01-22 MED ORDER — MAGNESIUM CITRATE PO SOLN
1.0000 | Freq: Once | ORAL | Status: AC
  Administered 2014-01-22: 1 via ORAL

## 2014-01-22 NOTE — Progress Notes (Signed)
An officer  from the [facility] police dispatch department called to the front desk to ask for Mr Sirmon, he said Mr tetrault called the 911 stating that the nurses were refusing to give him his medications. I went to knock at the door to check on him. He started yelling  using swear  words and had blocked the door with his walker. The hospital security was called to the floor , he came with an officer , went to his room , talked to him, calmed him down, and [facility] Audree Camel was notified. After these interventions, he opened the door.

## 2014-01-22 NOTE — Progress Notes (Addendum)
I was informed by staff that patient had left the floor "to go to the bank".  He stated that he would return.  House supervisor Shearer and I searched and found patient outside cancer center.  He stated that he was going to the bank to take care of some business.  When asked if someone else could do this for him he said he did not.  As we were talking to him he became angry and raised his voice.  He stated "if you stay around me it is going to get bad" he then turned and walked away.  I spoke with Dr. Olen Pel and she stated that when she saw the patient outside the elevator she informed him that if he left he would be discharged against medical advice.  She stated that she considered him discharged AMA at this time since. Patient returned to the unit at 115PM and wanted to resume his care.  Dr. Olen Pel was paged at that time to see how we should proceed. Spoke with Dr. Olen Pel and she stated it was OK to place patient back in room.  Patient had not been officially discharged from [facility] as AMA at that time and we are continuing with present course of treatment.  Social worker Suzanna spoke with family who stated they will come to the hospital.  I walked patient back to room 1340 and he was calm and cooperative.  Assigned RN Maudie Mercury updated with situation and plan.

## 2014-01-22 NOTE — Progress Notes (Signed)
Patient came to the desk and said he needed to leave and that he needed to go to the bank and get some things straightened out.  I told the patient that he could not leave.  He asked me why he could not leave and I told him because he was a patient in the hospital.  He said he wanted to talk to the manager, Jena Gauss.  I contacted Richardson Landry to come talk to the patient and he was with a patient in another room and not able to see him.  Dr. Olen Pel was called earlier at the request of the patient, regarding his medication.  Dr. Olen Pel just happened to come and see the patient during this time and said she was under the understanding that the patient wanted to go to the ATM machine and told him he could go.  Dr. Olen Pel told me that she gave him permission to go to the bank and I told her that he did not want to go to the ATM downstairs, that he wanted to leave the hospital and go to the bank and take care of some personal business. The patient continued to talk to Dr. Olen Pel.  I told Dr. Olen Pel that the nursing staff was to call security if the patient tried to leave.  When the patient was trying to leave, I went to the director Jena Gauss and assistant director Drue Dun and they made it clear that there were no papers to actually hold the patient here, that he is competent but lacks capacity.  They said that security could not stop him from leaving because there were not actually any papers to make the patient stay.  The patient got on the elevator to leave.  Patient still had peripheral IV in his left arm when he left.  Lorrene Reid

## 2014-01-22 NOTE — Progress Notes (Signed)
Entered room when patient was finishing conversation with MD.  Patient very angry and pacing in the room.  Stated that staff were not doing what they were supposed to be doing and that they keep lying to him about his medications.  Initially he refused his IV pain medication - stating that he only wanted his radiation therapy that he was promised when he was admitted to the hospital.  He did finally allow me to administer his IV Dilaudid.  He would not take any of the oral medication "until the doctor comes in and speaks to me about my radiation".  He stated that an MD promised to transfer him to [facility] last week and that did not happen.  He also states that the physicians are "experimenting on me by only giving me high powered Tylenol for pain".  Attempted to explain that he was on several high powered narcotic oral medications to which he responded "that's the pills I took yesterday and made me pass out".  Patient is using a tablet to record videos in the room of conversations with medical staff as well as personal narratives about how he feels he is being treated.  Dr. Olen Pel spoke with patient via telephone and she will be coming by to see him in person to further discuss his medication and other concerns.

## 2014-01-22 NOTE — Progress Notes (Addendum)
CSW received notification that pt had left the hospital AMA.   CSW went to unit and spoke with unit director, Jena Gauss. Per unit director, pt left hospital stating that he was going to the bank even after being asked to return to the hospital and being notified by Dr. Doyle Askew that if pt left then he would be discharged against medical advice (AMA). CSW notified [name], Lucious Groves about the situation and discussed that if patient wanted to return for continued medical treatment then pt would have to return through the emergency department. Pt HCPOA planned to contact pt family that live in town to speak with pt and/or come to the hospital.   CSW received notification that pt return to the unit at 1:15 PM. Unit Director, Jena Gauss contacted Dr. Doyle Askew to discuss and Dr. Doyle Askew stated that it was okay for pt to return to pt room as MD had not yet officially discharge the pt from [place] [facility] [place]. Per unit director, Jena Gauss, pt returned to room 1340.   CSW contacted [name], Lucious Groves and notified pt aunt of update that pt had returned back to his room in the hospital. Pt aunt stated that pt mother had been in contact with pt and pt notified pt mother that he had returned to his room. Pt aunt was very appreciative of CSW communication with pt family regarding situation.   CSW to continue to follow.  Alison Murray, MSW, [place] [employer] [phone]

## 2014-01-22 NOTE — Progress Notes (Signed)
Patient ID: Frank Ward, male   DOB: [birth year], 43 y.o.   MRN: [id number]  [facility] PROGRESS NOTE  [place]thorne Imri Lor YDX:[id number] DOB: 1971/02/13 DOA: 01/07/2014 PCP: No PCP Per Patient  Brief narrative: 43 y.o. 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 Despite limited treatment he has received so far he has had progressive collapse of pathologic fractures at C3 and to a lesser extent C4 area, narrowing of the central canal to 9 mm at the level of C3 with mass effect on the spinal cord and mild cord edema, progressive tumor encroachment into the spinal canal at the C3 level. MRI of thoracic and lumbar spine done 12/30/2013 demonstrated reactive marrow changes versus early osseous tumor. There is bulky tumor with epidural and extraosseous extension at T12 in the right L1 neural foramina. He also had bulky sacral tumor with extraosseous extension including impingement of the exiting left S1 nerve root. Additionally, he has extensive pulmonary metastatic disease. He presented to [facility] ED 01/09/2014 with intractable pain and has reported no significant pain relief with home analgesia.   There is a significant barrier to discharge as patient claims his pain is not controlled and he demands having dilaudid IV for pain control. Additionally, he was seen and evaluated by psychiatry and determined he does not have capacity to make decisions about his medical treatment or living arrangements. During this hospital stay he has had I 131 scan which showed significant uptake and  per Dr. Jana Hakim pt may benefit from being transferred to [facility] for dosimetry treatment and to observe for potential lung toxicity. [facility] did not accept the transfer. Due to behavioral issues skilled nursing facility would not be an option. Patient has too many complicated medical issues and is not appropriate for behavioral health placement.   Consulted endocrinology here for options regarding further treatment, appreciate Dr. Dwyane Dee input and expertise.   HPI/Subjective:  Patient extremely upset as he wants to go to the bank. Says he has to sign papers at the back otherwise he will lose all his money.   Assessment/Plan:  Metastatic papillary thyroid cancer  Metastatic disease in lungs, bones, spinal cord. Multiple discussions with the pt held about the extent of the disease but pt still adamant about wanting the treatment/cure. Radiation oncology did not feel that further RT will improve the progression of the metastases and or that would provide quality of life. They have recommended palliative care to be involved for goals of care.  Recent TSH was high at 27 and I-131 scan showed significant uptake; plan was transferred to [facility] for possible dosimetry however [facility] did not accept the transfer and has made recommendations for outpatient followup in 2-4 weeks.  Continue decadron 4 mg PO TID  Continue levothyroxine 150 mcg daily  Dr. Jana Hakim from Oncology following  Dr. Dwyane Dee from Endocrinology saw and evaluated patient, could benefit from I-131 after focal XRT of TL area, discuss with Dr. [name]. Behavioral issues/ Anxiety and paranoia  Patient has multiple behavioral issues all centered around his pain medications.  Pt family reports he has a long history of behavioral  issues including anxiety, paranoia and aggression.  Pt was seen and evaluated by psych who recommended Haldol 1 mg PO TID, ativan 1 mg PO BID, Seroquel 25 mg PO BID and 50 mg at bedtime. Patient continues to have episodes of paranoia and  aggression. He was re-evaluated by psychiatry and Abilify was started on 6/9.  He does not have capacity to make medical decisions, can be verbally and physically abusive and is refusing care both inpatient and as an outpatient, having multiple missed appointments which makes his long term treatment difficult.  Cancer related pain  Continue neck collar per neurosurgery recommendations.  Palliative care team is assisting with pain management at this time. Current regimen is MS contin 60 mg PO Q 12 hours, robaxin 500 mg PO TID, dilaudid 2 mg IV every 6 hours PRN, dilaudid 2 mg PO every 2 hours PRN and MSIR 15 mg every 6 hours as needed. Patient has been refusing so far his oral medications asking almost exclusively for IV pain medications.  Patient changed to IV dilaudid every 6 hours on Friday, then back to every 2 hours after complaining of increasing pain. I addressed this with palliative and will try to implement transitioning from IV to a full oral regimen.  Pt refusing to take all PO medications this AM.  Lower extremity edema  Concerning for lymphatic spread of his cancer or secondary to his low albumin. Please note that Lasix has been ordered x 3 however patient agreed to take it only once.  Echocardiogram did not show evidence of CHF  Vascular duplex was negative for DVT  Ambulating in hallways.  Anemia of chronic disease  Secondary to malignancy  Hemoglobin is stable, no current indications for transfusion  DVT prophylaxis: Lovenox subQ - patient refusing  Code Status: DNR/DNI  Family Communication: plan of care discussed with the patient  Disposition Plan: remains inpatient; [facility] did not accept transfer; pt can follow up with endo outpt in 2-4 weeks, pt want to go out for 20 minutes but refuses to be discharged    Consultants:  Medical oncology.  Radiation oncology  Endocrinology  Procedures:  None Antibiotics:  None   HPI/Subjective: No events overnight.   Objective: Filed  Vitals:   01/20/14 2135 01/21/14 0502 01/21/14 1436 01/22/14 0515  BP: 152/88 149/91 127/83 136/74  Pulse: 115 104 10 103  Temp:  98.7 F (37.1 C) 98.1 F (36.7 C) 98.3 F (36.8 C)  TempSrc: Oral Oral Oral Oral  Resp: 18 16 16 20   Height:      Weight:      SpO2: 97% 97% 97% 97%    Intake/Output Summary (Last 24 hours) at 01/22/14 1518 Last data filed at 01/22/14 0948  Gross per 24 hour  Intake    840 ml  Output      0 ml  Net    840 ml   Exam:   General:  Pt is alert, refuses physical exam this AM, he is ambulating int he hallway   Data Reviewed: Basic Metabolic Panel:  Recent Labs Lab 01/20/14 0402  NA 139  K 4.2  CL 100  CO2 28  GLUCOSE 248*  BUN 13  CREATININE 0.74  CALCIUM 9.3   Liver Function Tests: No results found for this basename: AST, ALT, ALKPHOS, BILITOT, PROT, ALBUMIN,  in the last 168 hours No results found for this basename: LIPASE, AMYLASE,  in the last 168 hours No results found for this basename: AMMONIA,  in the last  168 hours CBC: No results found for this basename: WBC, NEUTROABS, HGB, HCT, MCV, PLT,  in the last 168 hours Cardiac Enzymes: No results found for this basename: CKTOTAL, CKMB, CKMBINDEX, TROPONINI,  in the last 168 hours BNP: No components found with this basename: POCBNP,  CBG: No results found for this basename: GLUCAP,  in the last 168 hours  No results found for this or any previous visit (from the past 240 hour(s)).   Scheduled Meds: . ARIPiprazole  10 mg Oral BID  . dexamethasone  4 mg Oral TID  . docusate sodium  100 mg Oral BID  . enoxaparin (LOVENOX) injection  40 mg Subcutaneous Q24H  . furosemide  40 mg Intravenous Once  . haloperidol  2 mg Oral TID  . levothyroxine  150 mcg Oral QAC breakfast  . LORazepam  1 mg Oral BID  . magnesium citrate  1 Bottle Oral Once  . methocarbamol  500 mg Oral TID  . morphine  60 mg Oral Q12H  . multivitamin with minerals  1 tablet Oral Daily  . pantoprazole  40 mg Oral  Daily  . senna  2 tablet Oral QHS   Continuous Infusions:    Faye Ramsay, MD  [facility] Pager [phone]  If 7PM-7AM, please contact night-coverage [email or web address] Password [email or web address] 01/22/2014, 3:18 PM   LOS: 15 days

## 2014-01-22 NOTE — Consult Note (Signed)
Frank Ward Face-to-Face Psychiatry Consult   Reason for Consult: Confusion and competency evaluation Referring Physician: Caren Griffins, MD  Frank Ward is an 43 y.o. male. Total Time spent with patient: 1 hour  Assessment: AXIS I:  Psychotic Disorder NOS AXIS II:  Deferred AXIS III:   Past Medical History  Diagnosis Date  . Cancer     Thyroid  . Thyroid disease   . Hypertension   . Pneumonia    AXIS IV:  housing problems and problems related to social environment AXIS V:  41-50 serious symptoms  Plan:  Spoke with patient staff RN Based on my evaluation patient does not meet capacity to make his own medical decisions and living arrangements. Patient is in agreement regarding assisted living facility placement if needed.  Increase Abilify 10 mg PO BID (may substitute with liquid if needed) May give haldol 2 mg PO TID for psychosis and Haldol 34m and ativan 1 mg IM OR IV Q8hours PRN for agitation and aggression Patient is not a candidate for forcing medications at this time Contact psychiatric social services regarding medical care power of attorney Appreciate psychiatric consultation and we'll follow up as clinically required May contact [phone] needed further assistance Patient does not meet criteria for psychiatric inpatient admission.  Subjective:   Frank Ward a 43y.o. male is seen for psychiatric consultation follow up as he has not complaint twith his medication treatment and treatment for malignacy and does not follow through recommendation given. Patient appeared with hypomanic symptoms, tangential and circumstantial thought process and has difficulty with agreeing with medication recommendation from physician and he [facility]. Patient was educated about medication Abilify and encouraged to learn more about it and will start 5 mg PO BID for mood and psychosis. He was admitted with primary malignant neoplasm of thyroid  gland. Metastatic bone, brain, spinal cord. Patient appeared talking on phone while entered room and he is some what more cooperative than my last visit. He continue to be reluctant to take medication with concern that he will be hooked up with addiction. He was given assurance that the medication recommend by psychiatry does not have addiction potential. Patient has been staying with his mother and previously with his sister. Patient denies history of mental illness or family mental illness. Patient does not like doctors and he does not have much time to live. His thoughts are confused, disorganized and paranoid. He denies  psychosis or suicidal ideation/homicidal ideation. Reportedly he was working until 6 months ago in [employer] stated he has diploma in high school.   Interval History: This is most difficult patient to convince to take medications for his mood swings and paranoid ideations. He is noncompliant with his medications. Patient has been upset and irritable, saying several physicians are coming to his room and recommending pain medication but not helping to treat his cancer and leg swelling. He claims that he will be leaving the hospital, going to his brother home and wants to go for out patient cancer treatment. He also claims that hospital staff jumped on him about a month ago and he wants to meet with a lawyer to sue the hospital. He has been more confused and paranoid and misinterpreting his provider. He has mood swings from time to time. Patient continue to denied suicidal or homicidal ideation and contract for safety.  HPI Elements:  Location:  global. Quality:  severe. Severity:  severe. Timing:  several  months. Duration:  months. Context:  brain mets.  Past Psychiatric History: Past Medical History  Diagnosis Date  . Cancer     Thyroid  . Thyroid disease   . Hypertension   . Pneumonia     reports that he quit smoking about 2 years ago. His smoking use included  Cigarettes. He smoked 0.00 packs per day for 10 years. He has never used smokeless tobacco. He reports that he drinks about 4.8 ounces of alcohol per week. He reports that he uses illicit drugs (Marijuana). Family History  Problem Relation Age of Onset  . Diabetes Mother   . Diabetes Brother      Living Arrangements: Parent (lives with mother)   Abuse/Neglect [facility]) Physical Abuse: Denies Verbal Abuse: Denies Sexual Abuse: Denies Allergies:   Allergies  Allergen Reactions  . No Known Allergies      Objective: Blood pressure 136/74, pulse 103, temperature 98.3 F (36.8 C), temperature source Oral, resp. rate 20, height _0  (1.93 m), weight 86.1 kg (189 lb 13.1 oz), SpO2 97.00%.Body mass index is 23.11 kg/(m^2). Results for orders placed during the hospital encounter of 01/07/14 (from the past 72 hour(s))  BASIC METABOLIC PANEL     Status: Abnormal   Collection Time    01/20/14  4:02 AM      Result Value Ref Range   Sodium 139  137 - 147 mEq/L   Potassium 4.2  3.7 - 5.3 mEq/L   Chloride 100  96 - 112 mEq/L   CO2 28  19 - 32 mEq/L   Glucose, Bld 248 (*) 70 - 99 mg/dL   BUN 13  6 - 23 mg/dL   Creatinine, Ser 0.74  0.50 - 1.35 mg/dL   Calcium 9.3  8.4 - 10.5 mg/dL   GFR calc non Af Amer >90  >90 mL/min   GFR calc Af Amer >90  >90 mL/min   Comment: (NOTE)     The eGFR has been calculated using the CKD EPI equation.     This calculation has not been validated in all clinical situations.     eGFR's persistently <90 mL/min signify possible Chronic Kidney     Disease.   Labs are reviewed and are pertinent for ongoing medical issues.  Current Facility-Administered Medications  Medication Dose Route Frequency Provider Last Rate Last Dose  . ARIPiprazole (ABILIFY) tablet 5 mg  5 mg Oral BID Caren Griffins, MD   5 mg at 01/21/14 2241  . bisacodyl (DULCOLAX) suppository 10 mg  10 mg Rectal Daily PRN Janece Canterbury, MD      . dexamethasone (DECADRON) tablet 4 mg  4 mg Oral TID  Janece Canterbury, MD   4 mg at 01/21/14 2243  . docusate sodium (COLACE) capsule 100 mg  100 mg Oral BID Janece Canterbury, MD   100 mg at 01/21/14 2243  . enoxaparin (LOVENOX) injection 40 mg  40 mg Subcutaneous Q24H Janece Canterbury, MD   40 mg at 01/13/14 1057  . furosemide (LASIX) injection 40 mg  40 mg Intravenous Once Verlee Monte, MD      . haloperidol (HALDOL) tablet 1 mg  1 mg Oral TID Chauncey Cruel, MD   1 mg at 01/21/14 2241  . HYDROmorphone (DILAUDID) injection 2 mg  2 mg Intravenous Q6H PRN Caren Griffins, MD   2 mg at 01/22/14 0438  . HYDROmorphone (DILAUDID) tablet 2 mg  2 mg Oral Q2H PRN Costin Karlyne Greenspan, MD   2 mg at  01/22/14 0816  . levothyroxine (SYNTHROID, LEVOTHROID) tablet 150 mcg  150 mcg Oral QAC breakfast Elayne Snare, MD   150 mcg at 01/22/14 0816  . LORazepam (ATIVAN) tablet 1 mg  1 mg Oral BID Janece Canterbury, MD   1 mg at 01/21/14 2241  . LORazepam (ATIVAN) tablet 1 mg  1 mg Oral Q4H PRN Janece Canterbury, MD   1 mg at 01/20/14 0150  . magnesium citrate solution 1 Bottle  1 Bottle Oral Once Verlee Monte, MD      . methocarbamol (ROBAXIN) tablet 500 mg  500 mg Oral TID Janece Canterbury, MD   500 mg at 01/21/14 2241  . morphine (MS CONTIN) 12 hr tablet 60 mg  60 mg Oral Q12H Knox Royalty, NP   60 mg at 01/21/14 2241  . morphine (MSIR) tablet 15 mg  15 mg Oral Q6H PRN Caren Griffins, MD   15 mg at 01/22/14 0420  . multivitamin with minerals tablet 1 tablet  1 tablet Oral Daily Janece Canterbury, MD   1 tablet at 01/21/14 1036  . ondansetron (ZOFRAN) tablet 4 mg  4 mg Oral Q6H PRN Janece Canterbury, MD       Or  . ondansetron (ZOFRAN) injection 4 mg  4 mg Intravenous Q6H PRN Janece Canterbury, MD      . pantoprazole (PROTONIX) EC tablet 40 mg  40 mg Oral Daily Janece Canterbury, MD   40 mg at 01/21/14 1036  . polyethylene glycol (MIRALAX / GLYCOLAX) packet 17 g  17 g Oral Daily PRN Janece Canterbury, MD   17 g at 01/11/14 1004  . QUEtiapine (SEROQUEL) tablet 25 mg  25 mg Oral BID  Levonne Spiller, MD   25 mg at 01/21/14 1742  . QUEtiapine (SEROQUEL) tablet 50 mg  50 mg Oral QHS Levonne Spiller, MD   50 mg at 01/21/14 2241  . senna (SENOKOT) tablet 17.2 mg  2 tablet Oral QHS Janece Canterbury, MD   17.2 mg at 01/21/14 2242    Psychiatric Specialty Exam:     Blood pressure 136/74, pulse 103, temperature 98.3 F (36.8 C), temperature source Oral, resp. rate 20, height _0  (1.93 m), weight 86.1 kg (189 lb 13.1 oz), SpO2 97.00%.Body mass index is 23.11 kg/(m^2).  General Appearance: Disheveled  Eye Contact::  Poor  Speech:  Pressured  Volume:  Increased  Mood:  Angry, Anxious and Irritable  Affect:  Labile  Thought Process:  Circumstantial, Disorganized and Loose  Orientation:  Full (Time, Place, and Person)  Thought Content:  Paranoid Ideation and Rumination  Suicidal Thoughts:  No  Homicidal Thoughts:  No  Memory:  Immediate;   Fair Recent;   Fair Remote;   Fair  Judgement:  Impaired  Insight:  Lacking  Psychomotor Activity:  Increased and Restlessness  Concentration:  Poor  Recall:  Poor  Fund of Knowledge:Fair  Language: Good  Akathisia:  No  Handed:  Right  AIMS (if indicated):     Assets:  Desire for Improvement  Sleep:      Musculoskeletal: Strength & Muscle Tone: within normal limits Gait & Station: normal Patient leans: N/A  Treatment Plan Summary: Daily contact with patient to assess and evaluate symptoms and progress in treatment Medication management  Frank Ward,Frank R. 01/22/2014 10:21 AM

## 2014-01-23 DIAGNOSIS — C73 Malignant neoplasm of thyroid gland: Secondary | ICD-10-CM

## 2014-01-23 LAB — COMPREHENSIVE METABOLIC PANEL
ALT: 32 U/L (ref 0–53)
AST: 15 U/L (ref 0–37)
Albumin: 3.3 g/dL — ABNORMAL LOW (ref 3.5–5.2)
Alkaline Phosphatase: 111 U/L (ref 39–117)
BUN: 17 mg/dL (ref 6–23)
CALCIUM: 9 mg/dL (ref 8.4–10.5)
CO2: 29 mEq/L (ref 19–32)
Chloride: 99 mEq/L (ref 96–112)
Creatinine, Ser: 0.89 mg/dL (ref 0.50–1.35)
GFR calc Af Amer: >90 mL/min (ref >90)
GFR calc non Af Amer: >90 mL/min (ref >90)
GLUCOSE: 231 mg/dL — AB (ref 70–99)
Potassium: 4 mEq/L (ref 3.7–5.3)
Sodium: 138 mEq/L (ref 137–147)
TOTAL PROTEIN: 6.1 g/dL (ref 6.0–8.3)
Total Bilirubin: <0.2 mg/dL — ABNORMAL LOW (ref 0.3–1.2)

## 2014-01-23 LAB — CBC WITH DIFFERENTIAL/PLATELET
BASOS PCT: 0 % (ref 0–1)
Basophils Absolute: 0 10*3/uL (ref 0.0–0.1)
EOS ABS: 0 10*3/uL (ref 0.0–0.7)
EOS PCT: 0 % (ref 0–5)
HCT: 35.4 % — ABNORMAL LOW (ref 39.0–52.0)
Hemoglobin: 11.4 g/dL — ABNORMAL LOW (ref 13.0–17.0)
Lymphocytes Relative: 5 % — ABNORMAL LOW (ref 12–46)
Lymphs Abs: 0.6 10*3/uL — ABNORMAL LOW (ref 0.7–4.0)
MCH: 31.1 pg (ref 26.0–34.0)
MCHC: 32.2 g/dL (ref 30.0–36.0)
MCV: 96.7 fL (ref 78.0–100.0)
Monocytes Absolute: 0.7 10*3/uL (ref 0.1–1.0)
Monocytes Relative: 7 % (ref 3–12)
Neutro Abs: 8.9 10*3/uL — ABNORMAL HIGH (ref 1.7–7.7)
Neutrophils Relative %: 88 % — ABNORMAL HIGH (ref 43–77)
Platelets: 261 10*3/uL (ref 150–400)
RBC: 3.66 MIL/uL — ABNORMAL LOW (ref 4.22–5.81)
RDW: 15.5 % (ref 11.5–15.5)
WBC: 10.1 10*3/uL (ref 4.0–10.5)

## 2014-01-23 MED ORDER — FUROSEMIDE 10 MG/ML IJ SOLN
20.0000 mg | Freq: Every day | INTRAMUSCULAR | Status: DC
  Administered 2014-01-23 – 2014-01-25 (×3): 20 mg via INTRAVENOUS
  Filled 2014-01-23 (×3): qty 2

## 2014-01-23 MED ORDER — PIPERACILLIN-TAZOBACTAM 3.375 G IVPB
3.3750 g | Freq: Three times a day (TID) | INTRAVENOUS | Status: DC
  Administered 2014-01-23 – 2014-01-27 (×12): 3.375 g via INTRAVENOUS
  Filled 2014-01-23 (×13): qty 50

## 2014-01-23 MED ORDER — HYDROMORPHONE HCL PF 1 MG/ML IJ SOLN
1.0000 mg | INTRAMUSCULAR | Status: DC | PRN
  Administered 2014-01-23 (×5): 2 mg via INTRAVENOUS
  Administered 2014-01-24: 1 mg via INTRAVENOUS
  Administered 2014-01-24 (×3): 2 mg via INTRAVENOUS
  Administered 2014-01-24: 1 mg via INTRAVENOUS
  Filled 2014-01-23 (×10): qty 2

## 2014-01-23 MED ORDER — VANCOMYCIN HCL 10 G IV SOLR
1500.0000 mg | Freq: Two times a day (BID) | INTRAVENOUS | Status: DC
  Administered 2014-01-23 – 2014-01-24 (×4): 1500 mg via INTRAVENOUS
  Filled 2014-01-23 (×4): qty 1500

## 2014-01-23 MED ORDER — LORAZEPAM 2 MG/ML IJ SOLN
1.0000 mg | INTRAMUSCULAR | Status: DC | PRN
  Administered 2014-01-23 – 2014-01-25 (×4): 1 mg via INTRAVENOUS
  Filled 2014-01-23 (×4): qty 1

## 2014-01-23 NOTE — Progress Notes (Signed)
ANTIBIOTIC CONSULT NOTE - INITIAL  Pharmacy Consult for Vancomycin and Zosyn Indication: cellulitis  Allergies  Allergen Reactions  . No Known Allergies     Patient Measurements: Height: 6\' 4"  (193 cm) Weight: 189 lb 13.1 oz (86.1 kg) IBW/kg (Calculated) : 86.8   Vital Signs: Temp: 98.6 F (37 C) (06/11 0515) Temp src: Oral (06/11 0515) BP: 146/88 mmHg (06/11 0515) Pulse Rate: 113 (06/11 0515) Intake/Output from previous day: 06/10 0701 - 06/11 0700 In: 840 [P.O.:840] Out: -  Intake/Output from this shift:    Labs: No results found for this basename: WBC, HGB, PLT, LABCREA, CREATININE,  in the last 72 hours Estimated Creatinine Clearance: 142 ml/min (by C-G formula based on Cr of 0.74). No results found for this basename: VANCOTROUGH, VANCOPEAK, VANCORANDOM, GENTTROUGH, GENTPEAK, GENTRANDOM, TOBRATROUGH, TOBRAPEAK, TOBRARND, AMIKACINPEAK, AMIKACINTROU, AMIKACIN,  in the last 72 hours   Microbiology: No results found for this or any previous visit (from the past 720 hour(s)).  Medical History: Past Medical History  Diagnosis Date  . Cancer     Thyroid  . Thyroid disease   . Hypertension   . Pneumonia    Assessment: 43yo M admitted 5/26 with bilateral leg swelling, tingling, and pain. 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 Dopplers neg for DVT. Concern about lymphatic spread with LE edema. Dex, pain meds. Determining plan - Radiation vs. Comfort care. Difficult to control pain. Pharmacy is asked to dose Vanc and Zosyn for possible cellulitis.  Afebrile.  WBCs slightly elevated(last checked on 5/30).  SCr wnl and stable. CrCl >126ml/min.  No cultures ordered.  Goal of Therapy:  Vancomycin trough level 10-15 mcg/ml Appropriate antibiotic dosing for renal function; eradication of infection  Plan:   Vancomycin 1.5g IV q12h.  Zosyn 3.375g IV Q8H infused over 4hrs.  Measure Vanc trough at steady state.  Follow up renal fxn and culture  results.  Romeo Rabon, PharmD, pager [phone]. 01/23/2014,8:10 AM.

## 2014-01-23 NOTE — Progress Notes (Addendum)
Patient ID: Frank Ward, male   DOB: May 14, 1971, 43 y.o.   MRN: [id number] [facility] PROGRESS NOTE  Frank Ward WUJ:[id number] DOB: 1971/04/27 DOA: 01/07/2014 PCP: No PCP Per Patient  Brief narrative: 43 y.o. 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 Despite limited treatment he has received so far he has had progressive collapse of pathologic fractures at C3 and to a lesser extent C4 area, narrowing of the central canal to 9 mm at the level of C3 with mass effect on the spinal cord and mild cord edema, progressive tumor encroachment into the spinal canal at the C3 level. MRI of thoracic and lumbar spine done 12/30/2013 demonstrated reactive marrow changes versus early osseous tumor. There is bulky tumor with epidural and extraosseous extension at T12 in the right L1 neural foramina. He also had bulky sacral tumor with extraosseous extension including impingement of the exiting left S1 nerve root. Additionally, he has extensive pulmonary metastatic disease. He presented to [facility] ED 01/09/2014 with intractable pain and has reported no significant pain relief with home analgesia.  There is a significant barrier to discharge as patient claims his pain is not controlled and he demands having dilaudid IV for pain control. Additionally, he was seen and evaluated by psychiatry and determined he does not have capacity to make decisions about his medical treatment or living arrangements. During this hospital stay he has had I 131 scan which showed significant uptake and per  Dr. Jana Hakim pt may benefit from being transferred to [facility] for dosimetry treatment and to observe for potential lung toxicity. [facility] did not accept the transfer. Due to behavioral issues skilled nursing facility would not be an option. Patient has too many complicated medical issues and is not appropriate for behavioral health placement.  Consulted endocrinology here for options regarding further treatment, appreciate Dr. Dwyane Dee input and expertise.   HPI/Subjective:  Patient extremely upset yesterday as he wanted to go to the bank to sign papers so he does not lose his money. He left for brief time and came back. He does not have IVC papers.  Assessment/Plan:   Principal Problem: Behavioral issues/ Anxiety and paranoia   Patient has multiple behavioral issues all centered around his pain medications.   Pt family reports he has a long history of behavioral issues including anxiety, paranoia and aggression.   Pt was seen and evaluated by psych who recommended Haldol 2 mg PO TID, ativan 1 mg PO BID. He was on Seroquel but this was stopped. Patient continues to have episodes of paranoia and aggression. He was re-evaluated by psychiatry and Abilify 10 mg PO BID was started on 6/9. Per psych, pt is not a candidate for forcing medications at this time.  He does not have capacity to make medical decisions, can be verbally and physically abusive and is refusing care both inpatient and as an outpatient, having multiple missed appointments which makes his long term treatment difficult.   Active Problems: Cellulitis, left lower extremity  Pt started on vanco and zosyn 01/23/2014 --> Metastatic papillary thyroid cancer  Metastatic disease in lungs, bones, spinal  cord. Multiple discussions with the pt held about the extent of the disease but pt still adamant about wanting the treatment/cure. Radiation oncology did not feel that further RT will improve the progression of the metastases and or that would provide  quality of life. They have recommended palliative care to be involved for goals of care. We will talk with radiation oncology today and ask if they can re-eval for potential RT at least for pain control. Recent TSH was high at 27 and I-131 scan showed significant uptake; plan was transferred to [facility] for possible dosimetry however [facility] did not accept the transfer and has made recommendations for outpatient followup in 2-4 weeks.  Continue decadron 4 mg PO TID  Continue levothyroxine 150 mcg daily  Dr. Jana Hakim from Oncology following  Dr. Dwyane Dee from Endocrinology saw and evaluated patient, could benefit from I-131 after focal XRT of TL area, discuss with Dr. [name]. Cancer related pain  Continue neck collar per neurosurgery recommendations.  Palliative care team is assisting with pain management at this time. Current regimen is MS contin 60 mg PO Q 12 hours, robaxin 500 mg PO TID, dilaudid 1- 2 mg IV every 2 hours PRN. Patient has been refusing so far his oral medications asking almost exclusively for IV pain medications.  Lower extremity edema  Concerning for lymphatic spread of his cancer or secondary to his low albumin. Please note that Lasix has been ordered x 3 however patient agreed to take it only once.  Echocardiogram did not show evidence of CHF  Vascular duplex was negative for DVT  Ambulating in hallways.  Anemia of chronic disease  Secondary to malignancy  Hemoglobin is stable, no current indications for transfusion   DVT prophylaxis: Lovenox subQ - patient refusing    Code Status: DNR/DNI  Family Communication: plan of care discussed with the patient  Disposition Plan: remains inpatient; [facility] did not accept transfer; pt can follow up with endo outpt in 2-4 weeks, pt want to go out for 20 minutes but refuses to be discharged   Consultants:  Medical oncology.  Radiation oncology  Endocrinology  Procedures:  None Antibiotics:  vanco and zosyn started 01/23/2014  -->   HPI/Subjective: No events overnight.   Objective: Filed Vitals:   01/21/14 1436 01/22/14 0515 01/22/14 2141 01/23/14 0515  BP: 127/83 136/74 156/97 146/88  Pulse: 10 103 118 113  Temp:  98.3 F (36.8 C) 98.5 F (36.9 C) 98.6 F (37 C)  TempSrc: Oral Oral Oral Oral  Resp: 16 20 20 20   Height:      Weight:      SpO2: 97% 97% 97% 96%    Intake/Output Summary (Last 24 hours) at 01/23/14 0836 Last data filed at 01/22/14 1900  Gross per 24 hour  Intake    840 ml  Output      0 ml  Net    840 ml    Exam:   General:  Pt is alert, no acute distress, has cervical collar  Cardiovascular: Regular rate and rhythm, [name] appreciated   Respiratory: Clear to auscultation bilaterally, no wheezing, no crackles, no rhonchi  Abdomen: Soft, non tender, non distended, bowel sounds present, no guarding  Extremities: pedal edema +2, pulses DP and PT palpable bilaterally; erythema from foot to slightly above ankle, left LE  Neuro: Grossly nonfocal  Data Reviewed: Basic Metabolic Panel:  Recent Labs Lab 01/20/14 0402  NA 139  K 4.2  CL 100  CO2 28  GLUCOSE 248*  BUN  13  CREATININE 0.74  CALCIUM 9.3   Liver Function Tests: No results found for this basename: AST, ALT, ALKPHOS, BILITOT, PROT, ALBUMIN,  in the last 168 hours No results found for this basename: LIPASE, AMYLASE,  in the last 168 hours No results found for this basename: AMMONIA,  in the last 168 hours CBC: No results found for this basename: WBC, NEUTROABS, HGB, HCT, MCV, PLT,  in the last 168 hours Cardiac Enzymes: No results found for this basename: CKTOTAL, CKMB, CKMBINDEX, TROPONINI,  in the last 168 hours BNP: No components found with this basename: POCBNP,  CBG: No results found for this basename: GLUCAP,  in the last 168 hours  No results found for this or any previous visit (from the past 240 hour(s)).   Scheduled Meds: . ARIPiprazole  10 mg Oral BID  . dexamethasone  4 mg Oral TID  .  docusate sodium  100 mg Oral BID  . enoxaparin (LOVENOX)   40 mg Subcutaneous Q24H  . furosemide  20 mg Intravenous Daily  . haloperidol  2 mg Oral TID  . levothyroxine  150 mcg Oral QAC breakfast  . magnesium citrate  1 Bottle Oral Once  . methocarbamol  500 mg Oral TID  . morphine  60 mg Oral Q12H  . multivitamin  1 tablet Oral Daily  . pantoprazole  40 mg Oral Daily  . piperacillin-tazobactam  3.375 g Intravenous Q8H  . senna  2 tablet Oral QHS  . vancomycin  1,500 mg Intravenous Q12H     Faye Ramsay, MD  [facility] Pager [phone]  If 7PM-7AM, please contact night-coverage [email or web address] Password [email or web address] 01/23/2014, 8:36 AM   LOS: 16 days

## 2014-01-23 NOTE — Progress Notes (Signed)
I received a call from nuclear medicine regarding the possibility of treating the lower T-spine/upper L-spine. The patient is being considered for possible radioactive iodine, and it was felt such a treatment may be helpful prior to beginning iodine treatment. The patient was treated to the T-spine in [place]. I have requested details of this treatment to determine if additional radiation could be given to the area in question.

## 2014-01-23 NOTE — Progress Notes (Signed)
Spoke with Dr Doyle Askew concerning patient not having labs since 01/11/14 and now on ABX for infection in legs,  STAT labs ordered. Will continue to monitor.

## 2014-01-23 NOTE — Progress Notes (Signed)
Visited patient today on leadership rounds.  Much more ill appearing today.  Very brief responses to questions.  Sitting up in chair with head resting on hand.  Review of recent labs showed WBC 4.5 on 5/26 and increased to 11.4 on 5/30.   Patient has been afebrile.  Discussed with Jenny Reichmann RN as well as his worsening swelling and skin condition on lower extremities.  She will be contacting Dr. Olen Pel to further discuss.

## 2014-01-23 NOTE — Progress Notes (Signed)
Patient ID: Frank Ward, male   DOB: 11/05/1970, 43 y.o.   MRN: [id number]  Patient does not want to see psychiatric consultation and refuses medication recommended.  Recommended haldol IM/IV for aggression and Abilify for psychosis and agitation. Psychiatric consultation will sign off for now.Please contact [phone] if needs further assistance.    Tanner Yeley,JANARDHAHA R. 01/23/2014 9:05 AM

## 2014-01-23 NOTE — Progress Notes (Signed)
Pt was sitting up in chair and soaking his feet when I arrived. Pt said he was trying to get them down (they were swollen). Pt said he was ok and informed me the police came last night. When asked, he said he called the police because he was crying and in pain and no one would come to him. He said he had waited 2-3 hrs. We talked about the importance of not calling the police and talking with his nurse about how he can better handle a situation like that in the future. I encouraged him to ask his nurse how best to handle his problems with pain when it is not resolved right away (or when he wants it). Pt kind of laughed it off but admitted I was right. Pt got up to shake my hand and thank me for visit. Ernest Haber Chaplain  01/23/14 1300  Clinical Encounter Type  Visited With Patient

## 2014-01-24 LAB — VANCOMYCIN, TROUGH: Vancomycin Tr: 9.8 ug/mL — ABNORMAL LOW (ref 10.0–20.0)

## 2014-01-24 MED ORDER — HYDROMORPHONE HCL PF 2 MG/ML IJ SOLN
2.0000 mg | INTRAMUSCULAR | Status: DC | PRN
  Administered 2014-01-24 – 2014-01-26 (×14): 2 mg via INTRAVENOUS
  Filled 2014-01-24 (×15): qty 1

## 2014-01-24 MED ORDER — VITAMINS A & D EX OINT
TOPICAL_OINTMENT | CUTANEOUS | Status: AC
  Administered 2014-01-24: 1
  Filled 2014-01-24: qty 5

## 2014-01-24 MED ORDER — QUETIAPINE FUMARATE ER 50 MG PO TB24
100.0000 mg | ORAL_TABLET | Freq: Every day | ORAL | Status: DC
  Administered 2014-01-25 – 2014-01-27 (×3): 100 mg via ORAL
  Filled 2014-01-24 (×5): qty 2

## 2014-01-24 NOTE — Progress Notes (Signed)
01/24/14 0825  Spoke with patient about bed being elevated. Patient refused to let me lower his bed. Patient climbed out of bed from the foot of the bed.

## 2014-01-24 NOTE — Progress Notes (Signed)
Radiation Oncology         [phone]) [phone] ________________________________  Name: Cristoval Teall MRN: [id number]  Date: 01/07/2014  DOB: July 26, 1971    Narrative:  I visited the patient today to discuss possible additional palliative radiation treatment.  Endocrinology is interested in possible external beam radiation treatment prior to beginning radioactive iodine treatment, especially tumor in the thoracolumbar junction region. The patient on questioning today does complain of some pain in the mid back region. Recent imaging has included an MRI scan of the thoracic and lumbar spine. Among the findings was           osseous tumor at the thoracolumbar junction with epidural and extra osseous extension. No malignant lumbar spinal stenosis present. Additionally, bulky sacral tumor with extraosseous extension is present including impingement of the exiting left S1 nerve. The patient recently has also undergone an eye-131 whole body scan. 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 Compared to a pre-therapy scan from August of 2014, the current exam had improved.   ALLERGIES:  is allergic to no known allergies.  Meds: Current Facility-Administered Medications  Medication Dose Route Frequency Provider Last Rate Last Dose  . bisacodyl (DULCOLAX) suppository 10 mg  10 mg Rectal Daily PRN Janece Canterbury, MD      . dexamethasone (DECADRON) tablet 4 mg  4 mg Oral TID Janece Canterbury, MD   4 mg at 01/24/14 1539  . docusate sodium (COLACE) capsule 100 mg  100 mg Oral BID Janece Canterbury, MD   100 mg at 01/24/14 1431  . enoxaparin (LOVENOX) injection 40 mg  40 mg Subcutaneous Q24H Janece Canterbury, MD   40 mg at 01/13/14 1057  . furosemide (LASIX) injection 20 mg  20 mg Intravenous Daily Theodis Blaze, MD   20 mg at 01/24/14 1038  . furosemide (LASIX) injection 40 mg  40 mg Intravenous Once Verlee Monte, MD      . HYDROmorphone (DILAUDID) injection 2-3 mg  2-3 mg Intravenous  Q2H PRN Theodis Blaze, MD   2 mg at 01/24/14 1801  . levothyroxine (SYNTHROID, LEVOTHROID) tablet 150 mcg  150 mcg Oral QAC breakfast Elayne Snare, MD   150 mcg at 01/24/14 0828  . LORazepam (ATIVAN) injection 1 mg  1 mg Intravenous Q4H PRN Theodis Blaze, MD   1 mg at 01/24/14 0218  . magnesium citrate solution 1 Bottle  1 Bottle Oral Once Verlee Monte, MD      . methocarbamol (ROBAXIN) tablet 500 mg  500 mg Oral TID Janece Canterbury, MD   500 mg at 01/24/14 1539  . morphine (MS CONTIN) 12 hr tablet 60 mg  60 mg Oral Q12H Knox Royalty, NP   60 mg at 01/24/14 1038  . ondansetron (ZOFRAN) tablet 4 mg  4 mg Oral Q6H PRN Janece Canterbury, MD       Or  . ondansetron (ZOFRAN) injection 4 mg  4 mg Intravenous Q6H PRN Janece Canterbury, MD      . pantoprazole (PROTONIX) EC tablet 40 mg  40 mg Oral Daily Janece Canterbury, MD   40 mg at 01/24/14 1039  . piperacillin-tazobactam (ZOSYN) IVPB 3.375 g  3.375 g Intravenous Q8H Theodis Blaze, MD   3.375 g at 01/24/14 1758  . polyethylene glycol (MIRALAX / GLYCOLAX) packet 17 g  17 g Oral Daily PRN Janece Canterbury, MD   17 g at 01/11/14 1004  . QUEtiapine (SEROQUEL XR) 24 hr tablet 100 mg  100 mg Oral Daily Theodis Blaze, MD      . senna [facility]) tablet 17.2 mg  2 tablet Oral QHS Janece Canterbury, MD   17.2 mg at 01/23/14 2206  . vancomycin (VANCOCIN) 1,500 mg in sodium chloride 0.9 % 500 mL IVPB  1,500 mg Intravenous Q12H Theodis Blaze, MD   1,500 mg at 01/24/14 1037    Physical Findings: The patient is in no acute distress. Patient is alert and oriented.  height is 6\' 4"  (1.93 m) and weight is 189 lb 13.1 oz (86.1 kg). His oral temperature is 99 F (37.2 C). His blood pressure is 145/87 and his pulse is 113. His respiration is 18 and oxygen saturation is 98%. .   The patient was comfortable and lying in a hospital bed today. He was in fairly good spirits. Bilateral lower extremity edema present, greater on the left Neuro grossly intact  Lab Findings: Lab  Results  Component Value Date   WBC 10.1 01/23/2014   HGB 11.4* 01/23/2014   HCT 35.4* 01/23/2014   MCV 96.7 01/23/2014   PLT 261 01/23/2014     Radiographic Findings: Dg Cervical Spine Complete  12/29/2013   CLINICAL DATA:  hx cancer, r/o pathologic fx  EXAM: CERVICAL SPINE  4+ VIEWS  COMPARISON:  MR HEAD WO/W CM dated 12/26/2013; DG CHEST 2 VIEW dated 12/09/2013; DG CERVICAL SPINE COMPLETE dated 10/16/2013  FINDINGS: Stable chronic compression fractures involving C4 and C5. Degenerative disc disease changes within the lower cervical spine. No acute osseous abnormalities are appreciated. Stable pathologic fractures involving C4 and C5. No acute osseous abnormalities. There are areas of degenerative disc disease change within the mid and lower cervical spine.  IMPRESSION: Chronic compression deformities involving C4 and C5. No acute osseous abnormalities.   Electronically Signed   By: Margaree Mackintosh M.D.   On: 12/29/2013 23:37   Dg Lumbar Spine Complete  12/29/2013   CLINICAL DATA:  hx cancer, r/o pathologic fx  EXAM: LUMBAR SPINE - COMPLETE 4+ VIEW  COMPARISON:  None.  FINDINGS: There is no evidence of lumbar spine fracture. Alignment is normal. Intervertebral disc spaces are maintained. There is fusion of L5-S1. No lytic nor blastic lesions are appreciated.  IMPRESSION: No focal acute osseous abnormalities.  Fusion L5-S1.   Electronically Signed   By: Margaree Mackintosh M.D.   On: 12/29/2013 23:23   Dg Hip Complete Right  12/29/2013   CLINICAL DATA:  hx metastatic cancer, r/o pathologic fx  EXAM: RIGHT HIP - COMPLETE 2+ VIEW  COMPARISON:  None.  FINDINGS: There is no evidence of hip fracture or dislocation. Areas of peripheral hypertrophic spurring involving the acetabulum and lower border of the femoral head. Soft tissues unremarkable.  IMPRESSION: No acute osseous abnormalities.  Mild osteoarthritic changes.   Electronically Signed   By: Margaree Mackintosh M.D.   On: 12/29/2013 23:22   Mr Jeri Cos YQ  Contrast  12/26/2013   CLINICAL DATA:  SRS targeting.  Thyroid cancer.  EXAM: MRI HEAD WITHOUT AND WITH CONTRAST  TECHNIQUE: Multiplanar, multiecho pulse sequences of the brain and surrounding structures were obtained without and with intravenous contrast.  CONTRAST:  33mL MULTIHANCE GADOBENATE DIMEGLUMINE 529 MG/ML IV SOLN  COMPARISON:  Brain MRI of 08/28/2013  FINDINGS: Osseous metastatic disease in the cervical spine is more fully evaluated on separate cervical spine MRI.  There is no acute infarct or intracranial hemorrhage. Enhancing mass in the fourth ventricle has decreased in size, measuring 18 x 15 x  13 mm (series 10, image 37, previously 23 x 18 x 17 mm). 4 mm enhancing lesion in the right central sulcus is unchanged (series 10, image 114). 8 x 8 mm enhancing lesion in the atrium of the right lateral ventricle is unchanged (series 10, image 84). No new brain lesions are identified. Skull lesions noted on the prior study demonstrate increased precontrast T1 signal and no definite enhancement and are not definitely indicative of osseous metastatic disease.  There is mild T2 hyperintensity in the periventricular white matter and inferior pons/medulla, nonspecific but may be at least partly reflective of post treatment changes. There is slightly greater ventricular enlargement than on the prior study without frank hydrocephalus seen. There is no midline shift or extra-axial fluid collection.  Right frontal sinus fluid/mucosal thickening is noted. Mucous retention cysts are again seen in the maxillary sinuses. There are small bilateral mastoid effusions.  IMPRESSION: 1. Decreased size of lesion in the fourth ventricle. Unchanged size of lesions in the right central sulcus and right lateral ventricle. 2. No evidence of new intracranial metastases. 3. Slightly increased ventricular dilatation without definite evidence of hydrocephalus.   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  EXAM: MRI CERVICAL SPINE WITHOUT AND WITH CONTRAST  TECHNIQUE: Multiplanar and multiecho pulse sequences of the cervical spine, to include the craniocervical junction and cervicothoracic junction, were obtained according to standard protocol without and with intravenous contrast.  CONTRAST:  17 mL MultiHance  COMPARISON:  MRI of the cervical spine 08/28/2013  FINDINGS: Mild diffuse cord signal abnormality is now evident at the C3 level. The craniocervical junction is within normal limits.  C2-3: A rightward disc osteophyte complex is present. There is tumor infiltration into the right neural foramen and developing the right pedicle and posterior lateral mass of C3. There is progressive encroachment on the canal at the C3 level with further retropulsion of bone. The canal is now narrowed to 9 mm. Mild left facet hypertrophy is evident.  C3-4: Retropulsed bone and tumor effaces the right-sided PICC now with progression since prior exam. Progressive right foraminal stenosis is evident. The left foramen is patent.  C4-5: Mild right-sided uncovertebral disease is evident. Mild foraminal narrowing is present bilaterally without change.  C5-6: Left-sided uncovertebral spurring is present with mild left foraminal stenosis. The central canal and right foramen are patent.  C6-7:  Negative.  C7-T1:  Negative.  T1-2: An inferior vertebral body metastasis is present without posterior extension. There is no significant extraosseous tumor or associated stenosis.  Multiple left-sided lung nodules are present in the upper lobe, measuring up to 16 mm these were not previously imaged. There is some progression since the CT scan December 2014.  IMPRESSION: 1. Progressive collapse of pathologic fractures at C3 and to a lesser extent C4. 2. Progressive right foraminal and central  canal stenosis at C2-3. 3. Narrowing of the central canal to 9 mm at the level of C3 with mass effect on the spinal cord and mild cord edema. 4. Progressive tumor encroachment into the spinal canal at the C3 level. 5. Progressive right foraminal stenosis at C3-4. 6. Mild spondylosis at C4-5 and C5-6 is stable. 7. Metastasis along the inferior aspect of the T1 vertebral body is noted without posterior extension. 8. Multiple pulmonary nodules have increased in size  since the prior studies. The largest lesion is a medial upper lobe lesion on the left measuring 16 mm. Critical Value/emergent results were called by telephone at the time of interpretation on 12/26/2013 at 4:18 PM to Dr. [name] , who verbally acknowledged these results. And   Electronically Signed   By: Lawrence Santiago M.D.   On: 12/26/2013 16:18   Mr Thoracic Spine W Wo Contrast  12/30/2013   CLINICAL DATA:  43 year old male with metastatic thyroid cancer. Pain and weakness in both lower extremities. Staging.  EXAM: MRI THORACIC AND LUMBAR SPINE WITHOUT AND WITH CONTRAST  TECHNIQUE: Multiplanar and multiecho pulse sequences of the thoracic and lumbar spine were obtained without and with intravenous contrast.  CONTRAST:  28mL MULTIHANCE GADOBENATE DIMEGLUMINE 529 MG/ML IV SOLN  COMPARISON:  Brain and cervical spine MRI 12/26/2013. CT Abdomen and Pelvis 09/05/2013.  FINDINGS: MR THORACIC SPINE FINDINGS  See recent comparison for cervical spine findings.  Increased T1 signal in some thoracic vertebral bodies suggesting previous widespread spinal radiation.  Decreased T1 signal metastases in the T1 vertebral body (series 9, image 5).  Mottle decreased T1 signal throughout the T3, T4, T5, much of the T6, and T11 vertebral bodies could be red marrow reactivation or early metastatic disease.  Near complete replacement of the T9 vertebral body and left pedicle by tumor. Mild collapse of that level. Heterogeneous postcontrast enhancement associated. Despite  these findings, no epidural tumor or spinal stenosis at this level. There is left greater than right T9 foraminal stenosis related to the collapse and left pedicle expansion.  There is a partially exophytic tumor arising from the left T12 vertebral body anteriorly and laterally (series 9, image 49). Associated epidural tumor in the left T12 neural foramen resulting in foraminal stenosis (series 6, image 12). Bulky posterior element tumor at this level, contiguous with the upper lumbar tumor described below.  Superimposed small degenerative disc herniation at T7-T8. No thoracic spinal stenosis. Spinal cord signal is within normal limits at all visualized levels. No abnormal thoracic intradural enhancement.  Widespread signal abnormality in the lungs compatible with widespread pulmonary metastases.  MR LUMBAR SPINE FINDINGS  Bulky osseous and extraosseous tumor at the thoracolumbar junction encompassing up to 8 x 6 x 8 cm. Tumors tracking cephalad and caudal in the right deep paraspinal soft tissues. The right L1 posterior elements including the pedicle are obliterated. The T12 spinous process is largely obliterated. There is some epidural tumor in both T12 neural foramina, worse on the left). There is early epidural tumor in the right L1 neural foramina. Early right posterior lateral epidural tumor, but no narrowing of the spinal canal at this time.  Early involvement of the right L2 superior articulating facet by the bulky right spinal and paraspinal tumor described at L1. Otherwise the L2 and L3 levels are spared.  Bulky sacral tumor, replacing the right S1 and S2 a ala and obliterating the right SI joint and some of the medial right iliac bone. Tumor then tracks anteriorly in the right iliacus muscle (series 14, image 35). The S1 level is expanded, and epidural tumor impinges the left S1 nerve root (series 15, image 37). The sacral tumor on the right involves some of the right L5 inferior articulating facet, and  partially obliterates that facet joint. There are otherwise small focal tumors in the inferior L4 and posterior superior L5 vertebral bodies.  Despite the above findings, Visualized lower thoracic spinal cord is normal with conus medularis at L1. No lumbar  spinal stenosis. No thickening or abnormal enhancement of the cauda equina.  Stable visualized abdominal viscera.  IMPRESSION: 1. Suspect previous thoracic/thoracic spine radiation. Superimposed T1 and T9 metastases, the latter with a pathologic compression fracture affecting the at T9 neural foramina, but no spinal cord compression. 2. Reactive marrow changes versus early osseous tumor in the T3-T6 and T11 vertebrae. 3. Bulky thoracolumbar junction osseous tumor with epidural and extraosseous extension. No malignant lumbar spinal stenosis, but epidural tumor involves the bilateral T12 and right L1 neural foramina. 4. Bulky sacral tumor with extraosseous extension, including impingement of the exiting left S1 nerve. 5. Extensive pulmonary metastatic disease partially visible.   Electronically Signed   By: Lars Pinks M.D.   On: 12/30/2013 19:31   Mr Lumbar Spine W Wo Contrast  12/30/2013   CLINICAL DATA:  43 year old male with metastatic thyroid cancer. Pain and weakness in both lower extremities. Staging.  EXAM: MRI THORACIC AND LUMBAR SPINE WITHOUT AND WITH CONTRAST  TECHNIQUE: Multiplanar and multiecho pulse sequences of the thoracic and lumbar spine were obtained without and with intravenous contrast.  CONTRAST:  63mL MULTIHANCE GADOBENATE DIMEGLUMINE 529 MG/ML IV SOLN  COMPARISON:  Brain and cervical spine MRI 12/26/2013. CT Abdomen and Pelvis 09/05/2013.  FINDINGS: MR THORACIC SPINE FINDINGS  See recent comparison for cervical spine findings.  Increased T1 signal in some thoracic vertebral bodies suggesting previous widespread spinal radiation.  Decreased T1 signal metastases in the T1 vertebral body (series 9, image 5).  Mottle decreased T1 signal  throughout the T3, T4, T5, much of the T6, and T11 vertebral bodies could be red marrow reactivation or early metastatic disease.  Near complete replacement of the T9 vertebral body and left pedicle by tumor. Mild collapse of that level. Heterogeneous postcontrast enhancement associated. Despite these findings, no epidural tumor or spinal stenosis at this level. There is left greater than right T9 foraminal stenosis related to the collapse and left pedicle expansion.  There is a partially exophytic tumor arising from the left T12 vertebral body anteriorly and laterally (series 9, image 49). Associated epidural tumor in the left T12 neural foramen resulting in foraminal stenosis (series 6, image 12). Bulky posterior element tumor at this level, contiguous with the upper lumbar tumor described below.  Superimposed small degenerative disc herniation at T7-T8. No thoracic spinal stenosis. Spinal cord signal is within normal limits at all visualized levels. No abnormal thoracic intradural enhancement.  Widespread signal abnormality in the lungs compatible with widespread pulmonary metastases.  MR LUMBAR SPINE FINDINGS  Bulky osseous and extraosseous tumor at the thoracolumbar junction encompassing up to 8 x 6 x 8 cm. Tumors tracking cephalad and caudal in the right deep paraspinal soft tissues. The right L1 posterior elements including the pedicle are obliterated. The T12 spinous process is largely obliterated. There is some epidural tumor in both T12 neural foramina, worse on the left). There is early epidural tumor in the right L1 neural foramina. Early right posterior lateral epidural tumor, but no narrowing of the spinal canal at this time.  Early involvement of the right L2 superior articulating facet by the bulky right spinal and paraspinal tumor described at L1. Otherwise the L2 and L3 levels are spared.  Bulky sacral tumor, replacing the right S1 and S2 a ala and obliterating the right SI joint and some of the  medial right iliac bone. Tumor then tracks anteriorly in the right iliacus muscle (series 14, image 35). The S1 level is expanded, and epidural tumor impinges the  left S1 nerve root (series 15, image 37). The sacral tumor on the right involves some of the right L5 inferior articulating facet, and partially obliterates that facet joint. There are otherwise small focal tumors in the inferior L4 and posterior superior L5 vertebral bodies.  Despite the above findings, Visualized lower thoracic spinal cord is normal with conus medularis at L1. No lumbar spinal stenosis. No thickening or abnormal enhancement of the cauda equina.  Stable visualized abdominal viscera.  IMPRESSION: 1. Suspect previous thoracic/thoracic spine radiation. Superimposed T1 and T9 metastases, the latter with a pathologic compression fracture affecting the at T9 neural foramina, but no spinal cord compression. 2. Reactive marrow changes versus early osseous tumor in the T3-T6 and T11 vertebrae. 3. Bulky thoracolumbar junction osseous tumor with epidural and extraosseous extension. No malignant lumbar spinal stenosis, but epidural tumor involves the bilateral T12 and right L1 neural foramina. 4. Bulky sacral tumor with extraosseous extension, including impingement of the exiting left S1 nerve. 5. Extensive pulmonary metastatic disease partially visible.   Electronically Signed   By: Lars Pinks M.D.   On: 12/30/2013 19:31   Nm Whole Body I 131  01/14/2014   ADDENDUM REPORT: 01/14/2014 11:06  ADDENDUM: After further investigation it has been determined that the patient has in fact received prior radiolabeled I -31 therapy for treatment of thyroid cancer metastasis. On 04/02/2013 at the patient recieved 093.8 millicuries of I- 182 sodium iodide. A pre therapy scan was performed at this time which showed evidence of distant metastatic disease involving the lungs and bones. Compared with the pre therapy scan dated 03/26/2013 the degree of iodine avid  tumor on the current exam has improved. With the exception of the right iliac bone lesion there has been decreased activity within the lungs and bone.   Electronically Signed   By: Kerby Moors M.D.   On: 01/14/2014 11:06   01/14/2014   CLINICAL DATA:  Evaluate for iodine avid metastasis.  EXAM: NUCLEAR MEDICINE I-131 WHOLE BODY SCAN  TECHNIQUE: The patient received 3.2 mCi I-131 sodium iodide for the treatment of thyroid cancer within the past 10 days. The patient returns today, and whole body scanning was performed in the anterior and posterior projections.  COMPARISON:  PET-CT from 01/03/2013  FINDINGS: Small to medium size focus of mild increased uptake is identified within the thyroid bed. There is a large focus of mild to moderate radiotracer uptake within the right mid lung and right base. Within the left midlung there is a medium size focus of moderate increased radiotracer uptake. Within the right side of posterior pelvis there is a medium size focus of intense radiotracer uptake corresponding to lytic lesion identified in the right iliac bone. Physiologic tracer activity is seen within the GI tract.  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 There is also residual iodine avid tumor and/or thyroid tissue within the thyroid bed.  Electronically Signed: By: Kerby Moors M.D. On: 01/13/2014 16:07    Impression:    The patient unfortunately has very aggressive metastatic thyroid cancer with multiple sites of disease. If I do believe that the patient is a reasonable candidate for additional palliative radiation treatment at this time. Based on his recent imaging and after talking with him today, I believe that external beam radiation treatment would be reasonable to the bulky tumor at the thoracolumbar junction and also within the pelvis, particularly in the region of the right sacroiliac region. The patient has received external  beam radiation treatment in  [place] to the thoracic spine, targeting the T9 vertebral body level and adjacent spine. I have had a chance to review some of the information from this treatment and I believe that we could treat the thoracolumbar tumor safely. As part of the treatment planning process however, additional information would need to be obtained to confirm this.  I discussed such possible treatment with the patient and he is interested in proceeding with additional palliative radiation at this time.    Plan:  The patient will be scheduled for a simulation on Monday, 01/27/2014. I am going to be out of town next week but I will tentatively schedule him for treatment planning on Monday and will hand over his care to one of my partners we can begin treating him as soon as possible next week. I would anticipate a 2-3 week course of treatment.   Jodelle Gross, M.D., Ph.D.

## 2014-01-24 NOTE — Progress Notes (Signed)
  This NP continue to support patient through social visits only, offering support and assistance at his request.  He is continues to be easily agitated, always attempting to create  calm environment.   Attending service managing pain medications at this time.  Wadie Lessen NP  Palliative Medicine Team Team Phone # [phone] Pager [phone]

## 2014-01-24 NOTE — Progress Notes (Signed)
ANTIBIOTIC CONSULT NOTE - FOLLOW UP  Pharmacy Consult for Vancomycin and Zosyn Indication: cellulitis  Allergies  Allergen Reactions  . No Known Allergies     Patient Measurements: Height: 6\' 4"  (193 cm) Weight: 189 lb 13.1 oz (86.1 kg) IBW/kg (Calculated) : 86.8   Vital Signs: Temp: 99 F (37.2 C) (06/12 0458) Temp src: Oral (06/12 0458) BP: 145/87 mmHg (06/12 0458) Pulse Rate: 113 (06/12 0458) Intake/Output from previous day: 06/11 0701 - 06/12 0700 In: 1630 [P.O.:1630] Out: -  Intake/Output from this shift:    Labs:  Recent Labs  01/23/14 1638  WBC 10.1  HGB 11.4*  PLT 261  CREATININE 0.89   Estimated Creatinine Clearance: 127.6 ml/min (by C-G formula based on Cr of 0.89). No results found for this basename: VANCOTROUGH, VANCOPEAK, VANCORANDOM, GENTTROUGH, GENTPEAK, GENTRANDOM, TOBRATROUGH, TOBRAPEAK, TOBRARND, AMIKACINPEAK, AMIKACINTROU, AMIKACIN,  in the last 72 hours   Microbiology: No results found for this or any previous visit (from the past 720 hour(s)).  Medical History: Past Medical History  Diagnosis Date  . Cancer     Thyroid  . Thyroid disease   . Hypertension   . Pneumonia    Assessment: 44yo M admitted 5/26 with bilateral leg swelling, tingling, and pain. 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 Dopplers neg for DVT. Concern about lymphatic spread with LE edema. Dex, pain meds. Determining plan - Radiation vs. Comfort care. Difficult to control pain. Pharmacy is asked to dose Vanc and Zosyn for possible cellulitis.  Afebrile.  WBCs trending down (10.1 today)  SCr wnl and stable. CrCl >134ml/min.  No cultures ordered.  Goal of Therapy:  Vancomycin trough level 10-15 mcg/ml Appropriate antibiotic dosing for renal function; eradication of infection  Plan:   Vancomycin 1.5g IV q12h.  Zosyn 3.375g IV Q8H infused over 4hrs.  Will check Vanc trough tonight at 2130 to ensure dose is therapeutic.  Follow up renal  fxn and culture results.  Ryliegh Mcduffey C. Venola Castello, PharmD Clinical Pharmacist-Resident Pager: [phone] 01/24/2014 10:28 AM
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
## 2014-01-25 DIAGNOSIS — M7989 Other specified soft tissue disorders: Secondary | ICD-10-CM

## 2014-01-25 DIAGNOSIS — M79609 Pain in unspecified limb: Secondary | ICD-10-CM

## 2014-01-25 MED ORDER — LORAZEPAM 2 MG/ML IJ SOLN: 2.0000 mg | INTRAMUSCULAR | Status: DC | PRN

## 2014-01-25 MED ORDER — FUROSEMIDE 10 MG/ML IJ SOLN
40.0000 mg | Freq: Every day | INTRAMUSCULAR | Status: DC
  Administered 2014-01-26 – 2014-01-27 (×2): 40 mg via INTRAVENOUS
  Filled 2014-01-25 (×2): qty 4

## 2014-01-25 MED ORDER — VANCOMYCIN HCL 10 G IV SOLR
1250.0000 mg | Freq: Three times a day (TID) | INTRAVENOUS | Status: DC
  Administered 2014-01-25 – 2014-01-27 (×5): 1250 mg via INTRAVENOUS
  Filled 2014-01-25 (×8): qty 1250

## 2014-01-25 NOTE — Progress Notes (Signed)
This RN was notified by NT that pt had pulled his bed across the room. This RN went to patient's room and observed pt's bed was moved close to the bathroom. When asked what he was doing, pt stated he needed to pull the bed close to the bathroom so that he could connect the electric cord of his electric razor to the bed to allow him to shave in front of the mirror in the bathroom. Bed was not positioned in a way that obstructed the entrance to the room so patient was left to continue current plan. When this RN returned to give AM vancomycin dose, pt was found to have no IV access. IV pump was in the bathroom and the bed was pushed into the bathroom. Patient asked for me to give him the medicine and extended his arm. This RN stated that IV team will need to be called since pt has no IV access. IV team paged. Noreene Larsson RN, BSN

## 2014-01-25 NOTE — Progress Notes (Signed)
Patient returned back to floor. He is requesting his iv antibiotics at this time. I informed him I would need to page IV team to get a new iv started, and then I would be able to restart his antibiotics. IV team has been paged, will monitor patent.

## 2014-01-25 NOTE — Progress Notes (Signed)
Patient has new iv in place, and requested pain medicines. He did not feel like I was there quick enough, and was verbally abusive to me in room while I was giving pain meds, swearing, accusing me of taking my time getting to his room. I explained I came as quickly as I could (in fact less than 5 minutes had passed from his request, till I was in his room). After his iv dilaudid was given, I attempted to start his iv abx, but he went into the bathroom, and did not answer when I told him I needed to hook him up to give his antibiotics. Through the door I told him to call me when he was ready for his antibiotics, he did not respond. Will check on patient again.

## 2014-01-25 NOTE — Progress Notes (Signed)
Pt. Refused his Lovenox injection,educated pt. on importance of medication.  Dr. Doyle Askew notified and new orders received. Pt. Also refuses to have the bed placed in the low position, safety issue discussed and pt. still refuses.

## 2014-01-25 NOTE — Progress Notes (Signed)
Rx Brief Antibiotic note:  Vancomycin  Assessement:  VT=9.8 mg/L after 1500mg  q12h  SCr stable, CrCl >100 ml/min  Plan:  Change Vancomycin to 1250mg  IV q8h  F/u SCr/additional levels/cultures as needed   Dorrene German 01/25/2014 12:53 AM

## 2014-01-25 NOTE — Progress Notes (Signed)
Pt. Sitting in room. IV pulled out. Pt. States he just woke up and does not remember doing it, but he wrapped his arm in paper towels. Paper towels removed, no bleeding noted, and 2x2 gauze applied with tape. Pt. Appears agitated and wanted to take a nap prior to IV team coming to start a new IV. Allowed pt. to rest and continue to monitor.

## 2014-01-25 NOTE — Progress Notes (Signed)
CSW continues to follow for any CSW needs.  Bernita Raisin, [place] [facility] [phone]

## 2014-01-25 NOTE — Progress Notes (Signed)
VASCULAR LAB PRELIMINARY  PRELIMINARY  PRELIMINARY  PRELIMINARY  Left lower extremity venous duplex completed.    Preliminary report:  Left:  No evidence of DVT, superficial thrombosis, or Baker's cyst.  Hamda Klutts, RVS 01/25/2014, 10:40 AM

## 2014-01-25 NOTE — Progress Notes (Signed)
Patient ID: Frank Ward, male   DOB: [birth year], 43 y.o.   MRN: [id number] [facility] PROGRESS NOTE  Tallen Harrold Fitchett EHU:[id number] DOB: November 06, 2015 DOA: 01/07/2014 PCP: No PCP Per Patient  Brief narrative:  43 y.o. 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 Despite limited treatment he has received so far he has had progressive collapse of pathologic fractures at C3 and to a lesser extent C4 area, narrowing of the central canal to 9 mm at the level of C3 with mass effect on the spinal cord and mild cord edema, progressive tumor encroachment into the spinal canal at the C3 level. MRI of thoracic and lumbar spine done 12/30/2013 demonstrated reactive marrow changes versus early osseous tumor. There is bulky tumor with epidural and extraosseous extension at T12 in the right L1 neural foramina. He also had bulky sacral tumor with extraosseous extension including impingement of the exiting left S1 nerve root. Additionally, he has extensive pulmonary metastatic disease. He presented to [facility] ED 01/09/2014 with intractable pain and has reported no significant pain relief with home analgesia.   There is a significant barrier to discharge as patient claims his pain is not controlled and he demands having dilaudid IV for pain control. Additionally, he was seen and evaluated by psychiatry and determined he does not have capacity to make decisions about his medical treatment or living arrangements. During this hospital stay he has had I 131 scan which showed significant uptake and per  Dr. Jana Hakim pt may benefit from being transferred to [facility] for dosimetry treatment and to observe for potential lung toxicity. [facility] did not accept the transfer. Due to behavioral issues skilled nursing facility would not be an option. Patient has too many complicated medical issues and is not appropriate for behavioral health placement.   Consulted endocrinology here for options regarding further treatment, appreciate Dr. Dwyane Dee input and expertise.   Assessment/Plan:  Principal Problem:  Behavioral issues/ Anxiety and paranoia  Patient has multiple behavioral issues all centered around his pain medications.  Pt family reports he has a long history of behavioral issues including anxiety, paranoia and aggression.  Pt was seen and evaluated by psych who recommended Haldol 2 mg PO TID, ativan 1 mg PO BID. He was on Seroquel but this was stopped. Patient continues to have episodes of paranoia and aggression. He was re-evaluated by psychiatry and Abilify 10 mg PO BID was started on 6/9. Per psych, pt is not a candidate for forcing medications at this time.  He does not have capacity to make medical decisions, can be verbally and physically abusive and is refusing care both inpatient and as an outpatient, having multiple missed appointments which makes his long term treatment difficult.  Active Problems:  Cellulitis, left lower extremity  Pt started on vanco and zosyn 01/23/2014 -->  Clinically improving but left LE more swollen Metastatic papillary thyroid cancer  Metastatic disease in lungs, bones, spinal cord. Multiple discussions with the pt held about the extent of the disease but pt still adamant about wanting the treatment/cure. Radiation oncology did not feel that further RT will improve  the progression of the metastases and or that would provide quality of life. They have recommended palliative care to be involved for goals of care. We will talk with radiation oncology today and ask if they can  re-eval for potential RT at least for pain control.  Recent TSH was high at 27 and I-131 scan showed significant uptake; plan was transferred to [facility] for possible dosimetry however [facility] did not accept the transfer and has made recommendations for outpatient followup in 2-4 weeks.  Continue decadron 4 mg PO TID  Continue levothyroxine 150 mcg daily  Dr. Jana Hakim from Oncology following  Dr. Dwyane Dee from Endocrinology saw and evaluated patient, could benefit from I-131 after focal XRT of TL area, discuss with Dr. [name]. Cancer related pain  Continue neck collar per neurosurgery recommendations.  Palliative care team is assisting with pain management at this time. Current regimen is MS contin 60 mg PO Q 12 hours, robaxin 500 mg PO TID, dilaudid 1- 2 mg IV every 2 hours PRN. Patient has been refusing so far his oral medications asking almost exclusively for IV pain medications.  Rad oncology re consulted for potential radiation to the affected area Lower extremity edema  Concerning for lymphatic spread of his cancer or secondary to his low albumin. Please note that Lasix has been ordered x 3 however patient agreed to take it only once.  Echocardiogram did not show evidence of CHF  Vascular duplex negative for DVT  Ambulating in hallways as pt able to tolerate  Anemia of chronic disease  Secondary to malignancy  Hemoglobin is stable, no current indications for transfusion  DVT prophylaxis: Lovenox subQ - patient refusing  Code Status: DNR/DNI  Family Communication: plan of care discussed with the patient  Disposition Plan: remains inpatient; [facility] did not accept transfer; pt can follow up with endo outpt in 2-4 weeks, pt want to go out for 20 minutes but refuses to be discharged   Consultants:  Medical oncology.  Radiation oncology  Endocrinology  Procedures:  None Antibiotics:  vanc and zosyn started 01/23/2014 -->  HPI/Subjective: No events overnight.   Objective: Filed Vitals:   01/23/14  2121 01/24/14 0458 01/24/14 2157 01/25/14 1450  BP:  145/87 144/94 149/89  Pulse:  113 102 108  Temp:  99 F (37.2 C) 97.9 F (36.6 C) 97.1 F (36.2 C)  TempSrc: Oral Oral Oral Oral  Resp:  18 20 16   Height:      Weight:      SpO2:  98% 100% 98%    Intake/Output Summary (Last 24 hours) at 01/25/14 1549 Last data filed at 01/25/14 1230  Gross per 24 hour  Intake   1990 ml  Output      0 ml  Net   1990 ml    Exam:   General:  Pt is alert, follows commands appropriately, wants to defer physical exam  Data Reviewed: Basic Metabolic Panel:  Recent Labs Lab 01/20/14 0402 01/23/14 1638  NA 139 138  K 4.2 4.0  CL 100 99  CO2 28 29  GLUCOSE 248* 231*  BUN 13 17  CREATININE 0.74 0.89  CALCIUM 9.3 9.0   Liver Function Tests:  Recent Labs Lab 01/23/14 1638  AST 15  ALT 32  ALKPHOS 111  BILITOT <0.2*  PROT 6.1  ALBUMIN 3.3*   CBC:  Recent Labs Lab 01/23/14 1638  WBC 10.1  NEUTROABS 8.9*  HGB 11.4*  HCT 35.4*  MCV 96.7  PLT 261   Scheduled  Meds: . dexamethasone  4 mg Oral TID  . docusate sodium  100 mg Oral BID  . enoxaparin (LOVENOX) injection  40 mg Subcutaneous Q24H  . [START ON 01/26/2014] furosemide  40 mg Intravenous Daily  . levothyroxine  150 mcg Oral QAC breakfast  . magnesium citrate  1 Bottle Oral Once  . methocarbamol  500 mg Oral TID  . morphine  60 mg Oral Q12H  . pantoprazole  40 mg Oral Daily  . piperacillin-tazobactam (ZOSYN)  IV  3.375 g Intravenous Q8H  . QUEtiapine  100 mg Oral Daily  . senna  2 tablet Oral QHS  . vancomycin  1,250 mg Intravenous Q8H   Continuous Infusions:   Faye Ramsay, MD  [facility] Pager [phone]  If 7PM-7AM, please contact night-coverage [email or web address] Password [email or web address] 01/25/2014, 3:49 PM   LOS: 18 days

## 2014-01-25 NOTE — Progress Notes (Signed)
IV Team here to start new IV, pt. Refused. Dr. Doyle Askew notified.

## 2014-01-25 NOTE — Progress Notes (Signed)
Pt. States he needs to leave to go get clothes and shoes.I instructed pt. He is not allowed to leave the hospital, he is under medical care and has a serious foot infection. He said he is going.  Dr. Doyle Askew notified and stated ok to let him go then he can come back. Instructed pt. To come back as soon as possible. Camera operator and [facility] notified. Pt. Got on the elevator and left the unit, no respiratory distress noted.

## 2014-01-26 LAB — VANCOMYCIN, TROUGH: Vancomycin Tr: 10.7 ug/mL (ref 10.0–20.0)

## 2014-01-26 MED ORDER — METHOCARBAMOL 1000 MG/10ML IJ SOLN
500.0000 mg | Freq: Three times a day (TID) | INTRAVENOUS | Status: DC | PRN
  Filled 2014-01-26: qty 5

## 2014-01-26 MED ORDER — HYDROMORPHONE HCL PF 2 MG/ML IJ SOLN
3.0000 mg | INTRAMUSCULAR | Status: DC | PRN
  Administered 2014-01-26 – 2014-01-27 (×9): 3 mg via INTRAVENOUS
  Filled 2014-01-26 (×11): qty 2

## 2014-01-26 NOTE — Progress Notes (Signed)
Patient ID: Frank Ward, male   DOB: 1971/05/30, 43 y.o.   MRN: [id number] [facility] PROGRESS NOTE  Frank Ward KDX:[id number] DOB: [birth year] DOA: 01/07/2014 PCP: No PCP Per Patient  Brief narrative:  43 y.o. 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 Despite limited treatment he has received so far he has had progressive collapse of pathologic fractures at C3 and to a lesser extent C4 area, narrowing of the central canal to 9 mm at the level of C3 with mass effect on the spinal cord and mild cord edema, progressive tumor encroachment into the spinal canal at the C3 level. MRI of thoracic and lumbar spine done 12/30/2013 demonstrated reactive marrow changes versus early osseous tumor. There is bulky tumor with epidural and extraosseous extension at T12 in the right L1 neural foramina. He also had bulky sacral tumor with extraosseous extension including impingement of the exiting left S1 nerve root. Additionally, he has extensive pulmonary metastatic disease. He presented to [facility] ED 01/09/2014 with intractable pain and has reported no significant pain relief with home analgesia.   There is a significant barrier to discharge as patient claims his pain is not controlled and he demands having dilaudid IV for pain control. Additionally, he was seen and evaluated by psychiatry and determined he does not have capacity to make decisions about his medical treatment or living arrangements. During this hospital stay he has had I 131 scan which showed significant uptake and per  Dr. Jana Hakim pt may benefit from being transferred to [facility] for dosimetry treatment and to observe for potential lung toxicity. [facility] did not accept the transfer. Due to behavioral issues skilled nursing facility would not be an option. Patient has too many complicated medical issues and is not appropriate for behavioral health placement.   Consulted endocrinology here for options regarding further treatment, appreciate Dr. Dwyane Dee input and expertise.   Assessment/Plan:  Principal Problem:  Behavioral issues/ Anxiety and paranoia  Patient has multiple behavioral issues all centered around his pain medications.  Pt family reports he has a long history of behavioral issues including anxiety, paranoia and aggression.  Pt was seen and evaluated by psych who recommended Haldol 2 mg PO TID, ativan 1 mg PO BID. He was on Seroquel but this was stopped. Patient continues to have episodes of paranoia and aggression. He was re-evaluated by psychiatry and Abilify 10 mg PO BID was started on 6/9. Per psych, pt is not a candidate for forcing medications at this time.  He does not have capacity to make medical decisions, can be verbally and physically abusive and is refusing care both inpatient and as an outpatient, having multiple missed appointments which makes his long term treatment difficult.  Active Problems:  Cellulitis, left lower extremity  Pt started on vanco and zosyn 01/23/2014 -->  Clinically improving but left LE still more swollen, less erythema  Metastatic papillary thyroid cancer  Metastatic disease in lungs, bones, spinal cord. Multiple discussions with the pt held about the extent of the disease but pt still adamant about wanting the treatment/cure. Radiation oncology did not feel that  further RT will improve the progression of the metastases and or that would provide quality of life. They have recommended palliative care to be involved for goals of care. We will talk with radiation oncology today and  ask if they can re-eval for potential RT at least for pain control.  Recent TSH was high at 27 and I-131 scan showed significant uptake; plan was transferred to [facility] for possible dosimetry however [facility] did not accept the transfer and has made recommendations for outpatient followup in 2-4 weeks.  Continue decadron 4 mg PO TID, levothyroxine 150 mcg daily  Dr. Jana Hakim from Oncology following  Dr. Dwyane Dee from Endocrinology saw and evaluated patient, could benefit from I-131 after focal XRT of TL area Cancer related pain  Continue neck collar per neurosurgery recommendations.  Palliative care team is assisting with pain management at this time. Current regimen is MS contin 60 mg PO Q 12 hours, robaxin 500 mg TID, dilaudid 3 mg IV every 2 hours PRN.  Rad oncology re consulted for potential radiation to the affected area, possibly in AM Lower extremity edema  Concerning for lymphatic spread of his cancer or secondary to his low albumin. Please note that Lasix has been ordered x 3 however patient agreed to take it only once.  Echocardiogram did not show evidence of CHF  Vascular duplex negative for DVT  Ambulating in hallways as pt able to tolerate  Anemia of chronic disease  Secondary to malignancy  Hemoglobin is stable, no current indications for transfusion   DVT prophylaxis: Lovenox subQ - patient refusing  Code Status: DNR/DNI  Family Communication: plan of care discussed with the patient  Disposition Plan: remains inpatient; [facility] did not accept transfer; pt can follow up with endo outpt in 2-4 weeks, pt want to go out for 20 minutes but refuses to be discharged   Consultants:  Medical oncology.  Radiation oncology  Endocrinology  Procedures:  None Antibiotics:  vanc and zosyn started 01/23/2014 -->  HPI/Subjective: No events overnight.   Objective: Filed Vitals:   01/24/14 0458 01/24/14 2157 01/25/14 1450 01/26/14 0620  BP:   149/89 152/92  Pulse:   108 118  Temp:   97.1 F (36.2  C) 98 F (36.7 C)  TempSrc: Oral Oral Oral Oral  Resp:   16 20  Height:      Weight:      SpO2:   98% 100%    Intake/Output Summary (Last 24 hours) at 01/26/14 1300 Last data filed at 01/26/14 0900  Gross per 24 hour  Intake    840 ml  Output      0 ml  Net    840 ml    Exam:   General:  Pt is alert, refusing exam this AM  Data Reviewed: Basic Metabolic Panel:  Recent Labs Lab 01/20/14 0402 01/23/14 1638  NA 139 138  K 4.2 4.0  CL 100 99  CO2 28 29  GLUCOSE 248* 231*  BUN 13 17  CREATININE 0.74 0.89  CALCIUM 9.3 9.0   Liver Function Tests:  Recent Labs Lab 01/23/14 1638  AST 15  ALT 32  ALKPHOS 111  BILITOT <0.2*  PROT 6.1  ALBUMIN 3.3*   CBC:  Recent Labs Lab 01/23/14 1638  WBC 10.1  NEUTROABS 8.9*  HGB 11.4*  HCT 35.4*  MCV 96.7  PLT 261   Scheduled Meds: . dexamethasone  4 mg Oral TID  . docusate sodium  100 mg Oral BID  . enoxaparin (LOVENOX)  injection  40 mg Subcutaneous Q24H  . furosemide  40 mg Intravenous Daily  . levothyroxine  150 mcg Oral QAC breakfast  . magnesium citrate  1 Bottle Oral Once  . methocarbamol  500 mg Oral TID  . morphine  60 mg Oral Q12H  . pantoprazole  40 mg Oral Daily  . piperacillin-tazobactam (ZOSYN)  IV  3.375 g Intravenous Q8H  . QUEtiapine  100 mg Oral Daily  . senna  2 tablet Oral QHS  . vancomycin  1,250 mg Intravenous Q8H   Continuous Infusions:  Faye Ramsay, MD  [facility] Pager [phone]  If 7PM-7AM, please contact night-coverage [email or web address] Password [email or web address] 01/26/2014, 1:00 PM   LOS: 19 days

## 2014-01-27 ENCOUNTER — Ambulatory Visit
Admit: 2014-01-27 | Discharge: 2014-01-27 | Disposition: A | Discharge status: Home / Self Care | Payer: Medicaid Other | Attending: Radiation Oncology | Admitting: Radiation Oncology

## 2014-01-27 DIAGNOSIS — C73 Malignant neoplasm of thyroid gland: Secondary | ICD-10-CM | POA: Insufficient documentation

## 2014-01-27 DIAGNOSIS — C7952 Secondary malignant neoplasm of bone marrow: Secondary | ICD-10-CM

## 2014-01-27 DIAGNOSIS — Z510 Encounter for antineoplastic radiation therapy: Secondary | ICD-10-CM | POA: Insufficient documentation

## 2014-01-27 DIAGNOSIS — C7951 Secondary malignant neoplasm of bone: Secondary | ICD-10-CM | POA: Insufficient documentation

## 2014-01-27 MED ORDER — QUETIAPINE FUMARATE ER 50 MG PO TB24: 100.0000 mg | ORAL_TABLET | Freq: Every day | ORAL | Status: DC

## 2014-01-27 MED ORDER — FUROSEMIDE 20 MG PO TABS: 20.0000 mg | ORAL_TABLET | Freq: Every day | ORAL | Status: DC

## 2014-01-27 MED ORDER — CLINDAMYCIN HCL 300 MG PO CAPS: 300.0000 mg | ORAL_CAPSULE | Freq: Three times a day (TID) | ORAL | Status: DC

## 2014-01-27 MED ORDER — HYDROMORPHONE HCL PF 1 MG/ML IJ SOLN
3.0000 mg | INTRAMUSCULAR | Status: DC | PRN
  Administered 2014-01-27: 3 mg via INTRAVENOUS

## 2014-01-27 MED ORDER — LORAZEPAM 1 MG PO TABS: 1.0000 mg | ORAL_TABLET | Freq: Three times a day (TID) | ORAL | Status: DC | PRN

## 2014-01-27 MED ORDER — FUROSEMIDE 40 MG PO TABS
40.0000 mg | ORAL_TABLET | Freq: Every day | ORAL | Status: DC
  Filled 2014-01-27: qty 1

## 2014-01-27 MED ORDER — MORPHINE SULFATE ER 60 MG PO TBCR: 60.0000 mg | EXTENDED_RELEASE_TABLET | Freq: Two times a day (BID) | ORAL | Status: DC

## 2014-01-27 MED ORDER — HYDROCERIN EX CREA
TOPICAL_CREAM | Freq: Two times a day (BID) | CUTANEOUS | Status: DC
  Administered 2014-01-27: 1 via TOPICAL
  Filled 2014-01-27: qty 113

## 2014-01-27 MED ORDER — HYDROMORPHONE HCL 4 MG PO TABS: 4.0000 mg | ORAL_TABLET | ORAL | Status: DC | PRN

## 2014-01-27 MED ORDER — DOXYCYCLINE HYCLATE 100 MG PO TABS
100.0000 mg | ORAL_TABLET | Freq: Two times a day (BID) | ORAL | Status: DC
Start: 2014-01-27 — End: 2014-01-27
  Filled 2014-01-27 (×2): qty 1

## 2014-01-27 NOTE — Progress Notes (Signed)
While waiting for Dr. Meyers to come for discharge patient called a cab.  Once the cab arrived I went to the cab and asked the driver to wait as we were waiting on a doctor to sign his paperwork.  He agreed.  I also notified the [employer] officer in the ED lobby of the situation.  I returned to [address] and patient was anxiously pacing in front of the elevators.  While we were waiting on Dr. Meyers patient decided to leave via elevator.  I retrieved the prescriptions and took the stairs to the ED lobby.  Dr. Meyers arrived on [address] and was redirected to the ED Lobby.  She met me in the lobby and signed the prescriptions.  I went outside as Mr. Spahr was not in the lobby.  I observed patient walking from the ambulance parking area and he began yelling about the police car sitting in front of the ED entrance stating "You called the cops, is that how it is?".  I explained that there was always an officer in the ED and presented him with his prescriptions explaining that they were signed and ready to take to the pharmacy.  I instructed him to be sure to take all of his antibiotics to which he replied "yea, yea".  I was walking beside him to the cab and he became agitated and yelled "get up off me".  At this point I turned and walked back to the ED entrance.  Patient entered cab and driver drove away.  Upon returning to [facility] I called patients Aunt Pauline Chapman and informed her that the patient had been discharged.  She thanked me for calling. I also sent a message to [facility] regarding discharge.  

## 2014-01-27 NOTE — Progress Notes (Signed)
Patient PT:Frank Ward      DOB: Dec 07, 1970      EXN:[id number]   Palliative Medicine Team at [facility] Progress Note   Subjective:  -patient is calm and cooperative, tells me he is stating radiation today  -Request a moisterizing cream for lower extremiites  -offered subtle emotional support    Filed Vitals:   01/27/14 0514  BP: 143/85  Pulse: 101  Temp: 98.1 F (36.7 C)  Resp: 18   Physical exam:  General: chronically ill appearing, NAD Extrem: BLE with +2 edema, left leg erythmous (patietn has self wrapped in towels, defers exam)              -skin dry and scaly  Assessment and plan:      Complex medical and psychiatric situation, patient continues to weaken  PMT will continue to support holistically  Add Eucerin cream for BLE/dry skin per patient use.  Wadie Lessen NP  Palliative Medicine Team Team Phone # [phone] Pager [phone]

## 2014-01-27 NOTE — Progress Notes (Signed)
In speaking with patients nurse  Mr. Gracia had turned off his IV pump this morning causing his IV to occlude and it had to be replaced.  This has happened several times over the weekend and has caused delays in administration of his IV antibiotics including some missed doses.  This afternoon when the nurse hung the antibiotic the patient demanded to have his IV pain medication without the IV connected.  The nurse attempted to explain that he needed to finish the antibiotic and at that point he disconnected the IV himself.  He then left the department stating he was going for his radiation treatment.  Transport team arrived a few minutes later and stated the patient presented to the radiology department and was yelling at the staff because they were not ready for him.   I spoke with Dr. Olen Pel about the IV restarts and his non-compliance with the antibiotics.  We discussed changing to PO antibiotics and not replacing the IV if it becomes unusable again.  She will consult with the Radiation Oncologist to see what his plan will be for radiation treatment and tentative discharge this evening or tomorrow morning.  Also discussed with Surveyor, quantity.

## 2014-01-27 NOTE — Progress Notes (Signed)
Spoke with patient upon his return from radiation.  Stated that he had a plan for radiation and he just needs his prescriptions and he will go.  He stated he needed to leave soon because he had already called a cab.  Cline Crock RN D/C'd peripheral IV and I spoke with Dr. Olen Pel and she will be coming to give patient his discharge papers and prescriptions.

## 2014-01-27 NOTE — Progress Notes (Addendum)
Complex simulation/treatment planning note: I explained to the patient the potential acute and late toxicities of radiation therapy, and consent is signed. The patient was taken to the CT simulator after being premedicated with 3 mg of hydromorphone. A Vac lock immobilization device was constructed. His lower thoracic spine and sacral spine were scanned. I chose and isocenter along the T12 and also along the sacrum. I contoured CTVs for each site. I'm prescribing 3000 cGy in 10 sessions to each site. The T12-L1 vertebra and adjacent paraspinal disease will receive 3-D conformal planning with dose volume histograms for the kidneys and spinal cord. I am requesting daily KV imaging, setting up to his spine.

## 2014-01-27 NOTE — Discharge Summary (Signed)
Physician Discharge Summary  Frank Ward FVC:[id number] DOB: 06-22-1971 DOA: 01/07/2014  PCP: No PCP Per Patient  Admit date: 01/07/2014 Discharge date: 01/27/2014  Recommendations for Outpatient Follow-up:  1. Pt will need to follow up with PCP in 2-3 weeks post discharge 2. Please obtain BMP to evaluate electrolytes and kidney function 3. Please also check CBC to evaluate Hg and Hct levels 4. Please note the patient was discharged on clindamycin for treatment of cellulitis 5. Patient also started on Lasix to help with lower extremity swelling 6. Please note that patient underwent radiation treatment prior to this discharge 7. During the hospital stay, patient frequently leaving the floor, has demonstrated aggressive behavior towards staff 8. 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 Despite limited treatment he has received so far he has had progressive collapse of pathologic fractures at C3 and to a  lesser extent C4 area, narrowing of the central canal to 9 mm at the level of C3 with mass effect on the spinal cord and mild cord edema, progressive tumor encroachment into the spinal canal at the C3 level. MRI of thoracic and lumbar spine done 12/30/2013 demonstrated reactive marrow changes versus early osseous tumor. There is bulky tumor with epidural and extraosseous extension at T12 in the right L1 neural foramina. He also had bulky sacral tumor with extraosseous extension including impingement of the exiting left S1 nerve root. Additionally, he has extensive pulmonary metastatic disease. He presented to [facility] ED 01/09/2014 with intractable pain and has reported no significant pain relief with home analgesia.  There is a significant barrier to discharge as patient claims his pain is not controlled and he demands having dilaudid IV for pain control. Additionally, he was seen and evaluated by psychiatry and determined he does not have capacity to make decisions about his medical treatment. During this hospital stay he has had I 131 scan which showed significant uptake and per Dr. Jana Hakim pt may benefit from being transferred to [facility] for dosimetry treatment and to observe for potential lung toxicity. [facility] did not accept the transfer. Due to behavioral issues skilled nursing facility would not be an option. Patient has too many complicated medical issues and is not appropriate for behavioral health placement.  Consulted endocrinology here for options regarding further treatment, appreciate Dr. Dwyane Dee input and expertise.   Assessment/Plan:  Principal Problem:  Behavioral issues/ Anxiety and paranoia  Patient has multiple behavioral issues all centered around his pain medications. Patient also demonstrates aggressiveness toward staff, and readjust he believes that were not providing treatment for  cancer. Patient doesn't want to be treated for thyroid cancer, and he also wants to have radiation therapy to help with  pain control. Pt family reports he has a long history of behavioral issues including anxiety, paranoia and aggression.  Pt was seen and evaluated by psych who recommended Haldol 2 mg PO TID, ativan 1 mg PO BID. He was on Seroquel but this was stopped. Patient continues to have episodes of paranoia and aggression. He was re-evaluated by psychiatry and Abilify 10 mg PO BID was started on 6/9. Per psych, pt is not a candidate for forcing medications at this time.  He does not have capacity to make medical decisions, can be verbally and physically abusive Psych medications change to Seroquel only as patient appears to be tolerating Seroquel well and has demonstrated less aggressiveness and abusive behavior for the past 24 Active Problems:  Cellulitis, left lower extremity  Pt started on vanco and zosyn 01/23/2014, cellulitis has improved and patient transitioned to oral clindamycin to complete therapy upon discharge Please note that vascular ultrasound negative for DVT Metastatic papillary thyroid cancer  Metastatic disease in lungs, bones, spinal cord. Multiple discussions with the pt held about the extent of the disease but pt still adamant about wanting the treatment/cure. Radiation oncology did not feel that further RT will improve the progression of the metastases and or that would provide quality of life. They have recommended palliative care to be involved for goals of care. We will talk with radiation oncology today and ask if they can re-eval for potential RT at least for pain control.  Recent TSH was high at 27 and I-131 scan showed significant uptake; plan was transferred to [facility] for possible dosimetry however [facility] did not accept the transfer and has made recommendations for outpatient followup in 2-4 weeks.  Continue decadron 4 mg PO TID, levothyroxine 150 mcg daily  Dr. Jana Hakim from Oncology following  Dr. Dwyane Dee from Endocrinology saw and evaluated patient, could benefit from I-131 after focal  XRT of TL area Cancer related pain  Continue neck collar per neurosurgery recommendations.  Palliative care team is assisting with pain management at this time. Rad oncology re consulted and patient underwent simulation therapy prior to discharge He will continue radiation in an outpatient setting Lower extremity edema  Concerning for lymphatic spread of his cancer or secondary to his low albumin. Please note that Lasix has been ordered x 3 however patient agreed to take it only once.  Echocardiogram did not show evidence of CHF  Vascular duplex negative for DVT  Ambulating in hallways as pt able to tolerate  Anemia of chronic disease  Secondary to malignancy  Hemoglobin is stable, no current indications for transfusion  DVT prophylaxis: Lovenox subQ - patient refusing   Code Status: DNR/DNI  Family Communication: plan of care discussed with the patient  Disposition Plan: [facility] did not accept transfer; pt can follow up with endo outpt in 2-4 weeks, patient has requested to leave the floor on several different occasions but has refused discharge until radiation therapy started  Consultants:  Medical oncology.  Radiation oncology  Endocrinology  Procedures:  None Antibiotics:  vanc and zosyn started 01/23/2014 --> 01/27/2014 Clindamycin started 01/27/2014   Discharge Exam: Filed Vitals:   01/27/14 1305  BP: 167/94  Pulse: 120  Temp: 98.1 F (36.7 C)  Resp: 18   Filed Vitals:   01/26/14 1405 01/26/14 2124 01/27/14 0514 01/27/14 1305  BP: 144/89 141/95 143/85 167/94  Pulse: 104 112 101  120  Temp: 98.1 F (36.7 C) 98.3 F (36.8 C) 98.1 F (36.7 C) 98.1 F (36.7 C)  TempSrc: Oral Oral Oral Oral  Resp: 18 18 18 18   Height:      Weight:      SpO2: 99% 99% 96% 98%    General: Pt is alert, follows commands appropriately, not in acute distress Cardiovascular: Regular rhythm, tachycardic, [name] +, no murmurs, no rubs, no gallops Respiratory: Clear to auscultation  bilaterally, no wheezing, no crackles, no rhonchi Abdominal: Soft, non tender, non distended, bowel sounds +, no guarding Extremities: Left lower extremity erythema decreasing in size, less edema and less tenderness to palpation, right lower extremity swelling significantly decreased Neuro: Grossly nonfocal  Discharge Instructions  Discharge Instructions   Diet - low sodium heart healthy    Complete by:  As directed      Increase activity slowly    Complete by:  As directed             Medication List    STOP taking these medications       multivitamin with minerals Tabs tablet     OVER THE COUNTER MEDICATION     OVER THE COUNTER MEDICATION     OVER THE COUNTER MEDICATION     traMADol 50 MG tablet  Commonly known as:  ULTRAM      TAKE these medications       dexamethasone 4 MG tablet  Commonly known as:  DECADRON  Take 4 mg by mouth 3 (three) times daily with meals.     docusate sodium 100 MG capsule  Commonly known as:  COLACE  Take 100 mg by mouth 2 (two) times daily as needed for mild constipation.     furosemide 20 MG tablet  Commonly known as:  LASIX  Take 1 tablet (20 mg total) by mouth daily.     HYDROmorphone 4 MG tablet  Commonly known as:  DILAUDID  Take 1 tablet (4 mg total) by mouth every 4 (four) hours as needed for severe pain.     ibuprofen 200 MG tablet  Commonly known as:  ADVIL,MOTRIN  Take 200-400 mg by mouth every 6 (six) hours as needed for mild pain.     levothyroxine 100 MCG tablet  Commonly known as:  SYNTHROID, LEVOTHROID  Take 100 mcg by mouth daily before breakfast.     LORazepam 1 MG tablet  Commonly known as:  ATIVAN  Take 1 tablet (1 mg total) by mouth every 8 (eight) hours as needed for anxiety.     morphine 60 MG 12 hr tablet  Commonly known as:  MS CONTIN  Take 1 tablet (60 mg total) by mouth every 12 (twelve) hours.     pantoprazole 40 MG tablet  Commonly known as:  PROTONIX  Take 1 tablet (40 mg total) by mouth  daily.     QUEtiapine 50 MG Tb24 24 hr tablet  Commonly known as:  SEROQUEL XR  Take 2 tablets (100 mg total) by mouth daily.           Follow-up Information   Schedule an appointment as soon as possible for a visit with Faye Ramsay, MD. (As needed, If symptoms worsen)    Specialty:  Internal Medicine   Contact information:   [address] [place] [address] [phone]        The results of significant diagnostics from this hospitalization (including imaging, microbiology, ancillary and laboratory) are listed below for reference.  Microbiology: No results found for this or any previous visit (from the past 240 hour(s)).   Labs: Basic Metabolic Panel:  Recent Labs Lab 01/23/14 1638  NA 138  K 4.0  CL 99  CO2 29  GLUCOSE 231*  BUN 17  CREATININE 0.89  CALCIUM 9.0   Liver Function Tests:  Recent Labs Lab 01/23/14 1638  AST 15  ALT 32  ALKPHOS 111  BILITOT <0.2*  PROT 6.1  ALBUMIN 3.3*   No results found for this basename: LIPASE, AMYLASE,  in the last 168 hours No results found for this basename: AMMONIA,  in the last 168 hours CBC:  Recent Labs Lab 01/23/14 1638  WBC 10.1  NEUTROABS 8.9*  HGB 11.4*  HCT 35.4*  MCV 96.7  PLT 261   Cardiac Enzymes: No results found for this basename: CKTOTAL, CKMB, CKMBINDEX, TROPONINI,  in the last 168 hours BNP: BNP (last 3 results) No results found for this basename: PROBNP,  in the last 8760 hours CBG: No results found for this basename: GLUCAP,  in the last 168 hours   SIGNED: Time coordinating discharge: Over 30 minutes  Faye Ramsay, MD  [facility] 01/27/2014, 4:41 PM Pager [phone]  If 7PM-7AM, please contact night-coverage [email or web address] Password [email or web address]

## 2014-01-27 NOTE — Progress Notes (Addendum)
I went into pt.'s room to hang his antibiotic and pt was in the bathroom, he stated he'd be out in a little while. I returned to his room twenty minutes later and he had turned off his IV,which he had done yesterday. I explained again why he shouldn't touch his IV. The IV was clotted and IV team was called to restart. After the restart I went to hang the antibiotics again and the pt asked if I could leave the room because he was on the phone, it would be about 15 minutes. About 1:15 pm I finally started the antibiotics Vancomycin and zosyn. Transport was going to pick the pt up around 1400 to take him for simulation at the cancer center. I told him I would medicate him for pain just before he went down to the cancer center. I went to get the pain med and came back to his room to give it and he had disconnected the IV again while the antibiotics were running. Pt instructed not to touch his IV again and that the antibiotics needed to be used within a certain period of time once the are opened because they need to be refrigerated. At that point the pt stated I was fired. I left the room to discuss the situation with Jena Gauss, our Director. While speaking to Richardson Landry the pt left the unit and walked to the cancer center himself,without permission to leave the unit. At that point Jena Gauss took control of the pt's care and kept in touch with his MD.

## 2014-01-27 NOTE — Progress Notes (Addendum)
CSW continuing to follow.  CSW reviewed chart and spoke with MD and pt will being palliative radiation today.   CSW contacted [name], Frank Ward. CSW updated pt aunt that pt beginning radiation today, but pt will likely be discharged before radiation completed and CSW recommended that is may be beneficial for CSW to discuss with pt mother and/or pt brother about plan of care when pt is discharged especially if pt will have outpatient follow up appointments to attend. Pt aunt reports that she will contact pt mother to notify and follow up with CSW about if pt mother would like to meet with CSW to discuss plan.   CSW to continue to follow to assist as appropriate.  Addendum 2:40 pm:  CSW received update from [facility] Unit Director, Jena Gauss stating that he discussed with attending MD about changing pt to PO antibiotics and not replacing the IV if it becomes unusable again. Attending MD plans to consult with the Radiation Oncologist to see what his plan will be for radiation treatment and tentative discharge is planned for tomorrow.  CSW contacted pt aunt, Frank Ward and updated pt aunt. Pt aunt discussed that she plans to contact pt mother once she returns home and update pt mother about plans for discharge. CSW prepared pt aunt that pt could leave against medical advice this evening, but hopefully can be discharged appropriately tomorrow with the assistance of family. CSW awaiting return phone call from pt aunt in order for pt aunt to notify CSW who to contact tomorrow that is local to discuss pt discharge and discuss follow up appointments.   CSW to continue to follow.  Alison Murray, MSW, [place] [facility] [phone]

## 2014-01-27 NOTE — Discharge Instructions (Signed)
Cancer, Radiation Treatment Radiation therapy uses ionizing beams for killing cancer cells and shrinking tumors. Radiation damages both cancer cells and normal cells, but normal cells have the DNA to repair themselves. Cancer cells do not have DNA to repair themselves and therefore, are killed off by the radiation, and the body disposes of them. The goal of therapy is to kill as many cancer cells without causing harm to healthy cells near the cancer. Radiation therapy may also be used to reduce pain (palliative therapy).  RADIATION IS USED FOR DIFFERENT REASONS  As the primary or only treatment for the cancer.  Used before surgery to shrink a tumor.  Used after surgery to stop the growth of any remaining cancer cells.  Used in combination with other treatments to kill cancer cells.  Used in advanced cancer patients to help with symptoms of the cancer. RADIATION THERAPY MAY BE EXTERNAL OR INTERNAL  External beam radiation is used most often. It is given on an outpatient basis. This means you do not have to be hospitalized. The energy (source of radiation) used in external radiation therapy may come from X-rays or gamma rays. Although they are produced in different ways, both use packets of energy (photons). Lower energy beams can be used to destroy cancer cells on the surface of the body. Higher energy beams are used to treat cancer deep within the body. Compared with some other types of radiation, x-rays can deliver radiation to a fairly large area.  Internal radiation is called brachytherapy. There is a lose dose rate (LDR) where permanent seeds are implanted into the patient, usually used for GYN or Prostate cancer. This procedure is an inpatient procedure. High dose rate brachytherapy (HDR) is another type of radiation, which is an outpatient procedure. Needles are inserted into the patient, and the radiation travels through these needles to deliver the dose prescribed. This procedure is mainly  used for prostate and breast cancers. Both of these types use a live source of radiation. Radiation therapy may be used to treat almost all types of tumors. It is also used to treat leukemia and lymphoma. There are a few that are more radio resistant and may not respond to radiation alone. These are cancers of the blood-forming cells and lymphatic system.  For some types of cancer, radiation may be given to areas that do not have evidence of cancer. This is done to prevent cancer cells from growing in the area receiving the radiation. This technique is called prophylactic radiation therapy.  RISKS AND COMPLICATIONS Side effects of radiation therapy depend on which part of your body is exposed to radiation and how much radiation is used. Most people are affected by fatigue, which is the most common side effect.  Everybody deals with radiation differently. You cannot predict what the side effects will be. They do not occur right away, and it can take 2-3 weeks to develop side effects. Most side effects are temporary and can be controlled. Once the treatment is complete, the side effects do not stop right away. It can take up to 3-4 weeks to regain your energy or for the redness to go away. Your body does heal from the radiation. Some common side effects are:  Difficulty swallowing, coughing (head and neck cancers and lung cancers).  Feeling sick to your stomach (nauseous), vomiting and/or diarrhea (abdominal area or pelvis being treated).  Bladder problems, frequent urination and/or sexual dysfunction (bladder, kidney, prostate cancer).  Hair loss (brain tumors). BEFORE THE PROCEDURE There will   be a planning simulation usually in the radiation department using a CT planning scan. Your radiation oncologist will plan exactly where the radiation will be delivered. The treatment fields will be planned just for you in order to treat you the best way possible. They also make a plan to avoid critical  structures in the body. You may also be given a dye (contrast) during this CT planning scan or an MRI.  You will be positioned how you will be everyday for your treatment. The goal is to have a position that can be reproduced daily.  Usually, temporary marks will be placed on your body to give the therapists a place to shift from once the plan is complete. Then, tattoos are usually put on you in order for you to line up in the same way each day. These tattoos are permanent, but are the size of a freckle. PROCEDURE  You will lie perfectly still on a table in the position determined for treatment, while the linear accelerator moves around you.  This machine delivers the radiation in exact doses from many possible angles.  Treatments are usually spread out over several weeks. This is to allow your healthy cells to recover between sessions. Usually, treatments are spread out between 5-6 weeks, but this is determined by your radiation oncologist.  There is no pain during this treatment. You will not feel the radiation being delivered. The treatment takes about 15-20 minutes, including setup time. AFTER THE PROCEDURE You will have tests and follow-up appointments. They are usually 6 weeks to 6 months after radiation to see if the treatment helped reduce pain or eliminate the cancer cells. Document Released: 12/18/2008 Document Revised: 10/24/2011 Document Reviewed: 12/18/2008 Exit[employer] Patient Information 2014 [employer].  

## 2014-01-27 NOTE — Progress Notes (Signed)
Pt 's IV found turned off again. Pt turns it off. IV clotted. Pt instructed not to touch his IV. He states I know,I know.

## 2014-01-28 ENCOUNTER — Encounter: Payer: Self-pay | Admitting: Radiation Oncology

## 2014-01-28 DIAGNOSIS — C7951 Secondary malignant neoplasm of bone: Secondary | ICD-10-CM | POA: Diagnosis not present

## 2014-01-28 DIAGNOSIS — Z510 Encounter for antineoplastic radiation therapy: Secondary | ICD-10-CM | POA: Diagnosis present

## 2014-01-28 DIAGNOSIS — C73 Malignant neoplasm of thyroid gland: Secondary | ICD-10-CM | POA: Diagnosis not present

## 2014-01-28 NOTE — Progress Notes (Signed)
3-D simulation note: The patient underwent 3-D simulation for treatment to his lower thoracic spine (T11-L1). He was set up to a 4 field technique, AP/PA and LAO/RPO. 4 separate and unique multileaf collimators were designed to conform the field. He is treated with 10 MV photons anteriorly and 15 MV photons for the other 3 fields. I chose the 97% isodose curve for this dose prescription of 3000 cGy in 10 sessions. Dose volume histograms were obtained for the spinal cord and left and right kidneys. We met our departmental goals. He was also set up to his sacrum with a 3 field technique. 3 separate multileaf collimators were designed to conform the field. I'm also prescribing 3000 cGy in 10 sessions with 6 MV and 15 MV photons.

## 2014-01-29 ENCOUNTER — Telehealth: Payer: Self-pay | Admitting: *Deleted

## 2014-01-29 ENCOUNTER — Ambulatory Visit: Payer: Medicaid Other | Admitting: Radiation Oncology

## 2014-01-29 DIAGNOSIS — Z510 Encounter for antineoplastic radiation therapy: Secondary | ICD-10-CM | POA: Diagnosis not present

## 2014-01-29 NOTE — Telephone Encounter (Signed)
Per Dr Valere Dross he would like patient to begin radiation treatment today. Spoke w/Frank Ward [phone], pt's aunt who states she has been trying to reach pt's mother since yesterday. Informed her Dr Valere Dross wants to begin pt's radiation treatment today at 12:30 pm. Gave her this RN's name and direct contact #. She stated she will call back after reaching pt's mother.  Received call back from Ms Freda Ward who states pt's mother will call this RN to let us know if patient will be able to come for treatment today. Pt's mother aware that his appointment is for 12:15 pm. Received call from pt's aunt stating pt is staying with his sister, but they are not answering the phone. She stated she spoke with pt's mother again who said she would try to get in touch with her son to inform him of appointment today. Notified Kristi, RT, linac 1. Spoke w/L [employer] SW and requested pt be assisted w/transportation. She states she will notify Abby Potash, SW who will be able to assist pt today or tomorrow when he is in rad onc dept for his radiation treatment.  1:30 pm Spoke w/pt's sister, Frank Ward, [phone]. She states she will do her best to get pt back in [place] tomorrow to begin radiation treatments. Notified Dr Valere Dross, linac 1 by e mail.

## 2014-01-30 ENCOUNTER — Ambulatory Visit: Payer: Medicaid Other | Admitting: Radiation Oncology

## 2014-01-30 ENCOUNTER — Encounter: Payer: Self-pay | Admitting: *Deleted

## 2014-01-30 ENCOUNTER — Ambulatory Visit: Payer: Medicaid Other

## 2014-01-30 NOTE — Progress Notes (Signed)
[place] [facility]  Clinical Social Work was referred by nurse for assessment of psychosocial needs due to transportation concerns.  Clinical Social Worker attempted to contact patient at home to offer support and assess for needs.  CSW reviewed chart and was made aware of additional psychosocial concerns. Pt did not answer his phone and it was not clear if one could leave a vm at this time. CSW noted pt was able to arrange a cab when discharged from the hospital and he is noted to have medicaid that can also assist with transportation.   CSW phoned his aunt, Lucious Groves who shared that Pt is staying with family in [facility] and will not be able to attend appointment today. CSW explained to aunt that pt could access medicaid transportation to assist with getting to medical appointments, but this would need to be done at least 24 hours in advance of an appointment. She plans to relay this information to pt and family.   CSW was able to speak with sister, Christain Sacramento [phone]. Per sister, pt went home to [place] area to make his funeral arrangements. Per sister, pt is declining medically and needs assistance. She reports he is very weak and unable to move. CSW let RN, Florian Buff know of situation and she plans to call sister. It appears, pt needs to be closer to family at this point. He may be appropriate for hospice care in that area. CSW available to assist as needed.     Clinical Social Work interventions: Resource education and referral Problem solving  Loren Racer, LCSW Clinical Social Worker [facility] [place] Wednesday, Thursday and Friday Phone: [phone] Fax: [phone]

## 2014-01-31 ENCOUNTER — Ambulatory Visit: Payer: Medicaid Other

## 2014-01-31 ENCOUNTER — Telehealth: Payer: Self-pay | Admitting: *Deleted

## 2014-01-31 NOTE — Telephone Encounter (Signed)
3:32 pm Spoke w/Gail Candis Schatz, pt's sister. Informed her that cancer center is not open on weekends, so if pt returns to [place] this weekend he would not be able to come to cancer center. Advised her that if pt does not have place to stay he could be taken to [facility] ED, but that this RN could not guarantee he would be admitted.  Baker Janus stated patient wouldn't be brought to [facility] until Monday and that he does have family to stay with. Informed her pt's treatment time on 02/03/14 is 3:30 pm. Baker Janus verbalized understanding and expressed appreciation for this RN's FU call. Linac 1 notified of this conversation.

## 2014-02-03 ENCOUNTER — Ambulatory Visit: Payer: Medicaid Other

## 2014-02-03 ENCOUNTER — Ambulatory Visit
Admission: RE | Admit: 2014-02-03 | Discharge: 2014-02-03 | Disposition: A | Discharge status: Home / Self Care | Payer: Medicaid Other | Source: Ambulatory Visit | Attending: Radiation Oncology | Admitting: Radiation Oncology

## 2014-02-03 ENCOUNTER — Ambulatory Visit: Payer: Medicaid Other | Admitting: Radiation Oncology

## 2014-02-03 NOTE — Progress Notes (Signed)
Pt was no show for radiation treatment today. Called his sister, Christain Sacramento and reached vm. Left detailed vm with name and call back number. Requested she call back to inform of pt's status.

## 2014-02-04 ENCOUNTER — Telehealth: Payer: Self-pay | Admitting: *Deleted

## 2014-02-04 ENCOUNTER — Ambulatory Visit: Payer: Medicaid Other

## 2014-02-04 NOTE — Telephone Encounter (Signed)
Called patient's sister, Christain Sacramento whom the patient has been living with, re: patient's status, location. Left vm requesting call back today with this caller's name and direct number.

## 2014-02-05 ENCOUNTER — Ambulatory Visit: Payer: Medicaid Other

## 2014-02-05 NOTE — Telephone Encounter (Signed)
Called patient's family member, Frank Ward [phone] to inquire about pt's status, location. Left vm requesting a call back with this caller's name and number.  02/06/14 3:25 pm Spoke w/Frank Ward, patient's sister. She states she was in hospital x 2 days herself, was just released. She states [name] is in route to [facility]. She states he was found face down in the bathroom and not responding well. She states she is on her way to the hospital. She states pt is allergic to Morphine although that is the pain medication he was taking. Informed Ms Candis Ward this RN will call her next Mon to FU w/pt's status. She conformed she has this RN's name and number. Raquel Sarna, RT on Linac 1 notified.

## 2014-02-06 ENCOUNTER — Telehealth: Payer: Self-pay | Admitting: Internal Medicine

## 2014-02-06 ENCOUNTER — Inpatient Hospital Stay (HOSPITAL_COMMUNITY)
Admission: EM | Admit: 2014-02-06 | Discharge: 2014-02-22 | DRG: 917 | Disposition: A | Discharge status: Home / Self Care | Payer: Medicaid Other | Attending: Internal Medicine | Admitting: Internal Medicine

## 2014-02-06 ENCOUNTER — Emergency Department (HOSPITAL_COMMUNITY): Payer: Medicaid Other

## 2014-02-06 ENCOUNTER — Ambulatory Visit: Payer: Medicaid Other

## 2014-02-06 ENCOUNTER — Encounter (HOSPITAL_COMMUNITY): Payer: Self-pay | Admitting: Emergency Medicine

## 2014-02-06 DIAGNOSIS — Z515 Encounter for palliative care: Secondary | ICD-10-CM

## 2014-02-06 DIAGNOSIS — L97519 Non-pressure chronic ulcer of other part of right foot with unspecified severity: Secondary | ICD-10-CM

## 2014-02-06 DIAGNOSIS — F39 Unspecified mood [affective] disorder: Secondary | ICD-10-CM | POA: Diagnosis present

## 2014-02-06 DIAGNOSIS — E890 Postprocedural hypothyroidism: Secondary | ICD-10-CM | POA: Diagnosis present

## 2014-02-06 DIAGNOSIS — F411 Generalized anxiety disorder: Secondary | ICD-10-CM | POA: Diagnosis present

## 2014-02-06 DIAGNOSIS — G8929 Other chronic pain: Secondary | ICD-10-CM

## 2014-02-06 DIAGNOSIS — S129XXD Fracture of neck, unspecified, subsequent encounter: Secondary | ICD-10-CM

## 2014-02-06 DIAGNOSIS — R7309 Other abnormal glucose: Secondary | ICD-10-CM | POA: Diagnosis present

## 2014-02-06 DIAGNOSIS — T426X4A Poisoning by other antiepileptic and sedative-hypnotic drugs, undetermined, initial encounter: Secondary | ICD-10-CM | POA: Diagnosis present

## 2014-02-06 DIAGNOSIS — C7951 Secondary malignant neoplasm of bone: Secondary | ICD-10-CM

## 2014-02-06 DIAGNOSIS — R410 Disorientation, unspecified: Secondary | ICD-10-CM

## 2014-02-06 DIAGNOSIS — M8450XS Pathological fracture in neoplastic disease, unspecified site, sequela: Secondary | ICD-10-CM

## 2014-02-06 DIAGNOSIS — F2089 Other schizophrenia: Secondary | ICD-10-CM

## 2014-02-06 DIAGNOSIS — M8448XA Pathological fracture, other site, initial encounter for fracture: Secondary | ICD-10-CM | POA: Diagnosis present

## 2014-02-06 DIAGNOSIS — C7952 Secondary malignant neoplasm of bone marrow: Secondary | ICD-10-CM

## 2014-02-06 DIAGNOSIS — D630 Anemia in neoplastic disease: Secondary | ICD-10-CM | POA: Diagnosis present

## 2014-02-06 DIAGNOSIS — T4271XA Poisoning by unspecified antiepileptic and sedative-hypnotic drugs, accidental (unintentional), initial encounter: Secondary | ICD-10-CM | POA: Diagnosis present

## 2014-02-06 DIAGNOSIS — C801 Malignant (primary) neoplasm, unspecified: Secondary | ICD-10-CM

## 2014-02-06 DIAGNOSIS — I10 Essential (primary) hypertension: Secondary | ICD-10-CM | POA: Diagnosis present

## 2014-02-06 DIAGNOSIS — F22 Delusional disorders: Secondary | ICD-10-CM | POA: Diagnosis not present

## 2014-02-06 DIAGNOSIS — L97921 Non-pressure chronic ulcer of unspecified part of left lower leg limited to breakdown of skin: Secondary | ICD-10-CM

## 2014-02-06 DIAGNOSIS — Z5987 Material hardship due to limited financial resources, not elsewhere classified: Secondary | ICD-10-CM

## 2014-02-06 DIAGNOSIS — C73 Malignant neoplasm of thyroid gland: Secondary | ICD-10-CM | POA: Diagnosis present

## 2014-02-06 DIAGNOSIS — S129XXA Fracture of neck, unspecified, initial encounter: Secondary | ICD-10-CM

## 2014-02-06 DIAGNOSIS — M545 Low back pain, unspecified: Secondary | ICD-10-CM | POA: Diagnosis present

## 2014-02-06 DIAGNOSIS — D649 Anemia, unspecified: Secondary | ICD-10-CM | POA: Diagnosis present

## 2014-02-06 DIAGNOSIS — Z87891 Personal history of nicotine dependence: Secondary | ICD-10-CM

## 2014-02-06 DIAGNOSIS — L02619 Cutaneous abscess of unspecified foot: Secondary | ICD-10-CM | POA: Diagnosis present

## 2014-02-06 DIAGNOSIS — Z833 Family history of diabetes mellitus: Secondary | ICD-10-CM

## 2014-02-06 DIAGNOSIS — F03918 Unspecified dementia, unspecified severity, with other behavioral disturbance: Secondary | ICD-10-CM | POA: Diagnosis present

## 2014-02-06 DIAGNOSIS — Z79899 Other long term (current) drug therapy: Secondary | ICD-10-CM

## 2014-02-06 DIAGNOSIS — R531 Weakness: Secondary | ICD-10-CM

## 2014-02-06 DIAGNOSIS — R739 Hyperglycemia, unspecified: Secondary | ICD-10-CM | POA: Diagnosis present

## 2014-02-06 DIAGNOSIS — Z791 Long term (current) use of non-steroidal anti-inflammatories (NSAID): Secondary | ICD-10-CM

## 2014-02-06 DIAGNOSIS — G92 Toxic encephalopathy: Secondary | ICD-10-CM | POA: Diagnosis present

## 2014-02-06 DIAGNOSIS — M2040 Other hammer toe(s) (acquired), unspecified foot: Secondary | ICD-10-CM

## 2014-02-06 DIAGNOSIS — F29 Unspecified psychosis not due to a substance or known physiological condition: Secondary | ICD-10-CM

## 2014-02-06 DIAGNOSIS — L97529 Non-pressure chronic ulcer of other part of left foot with unspecified severity: Secondary | ICD-10-CM

## 2014-02-06 DIAGNOSIS — R600 Localized edema: Secondary | ICD-10-CM

## 2014-02-06 DIAGNOSIS — E079 Disorder of thyroid, unspecified: Secondary | ICD-10-CM

## 2014-02-06 DIAGNOSIS — T380X5A Adverse effect of glucocorticoids and synthetic analogues, initial encounter: Secondary | ICD-10-CM | POA: Diagnosis present

## 2014-02-06 DIAGNOSIS — Z91199 Patient's noncompliance with other medical treatment and regimen due to unspecified reason: Secondary | ICD-10-CM

## 2014-02-06 DIAGNOSIS — L97929 Non-pressure chronic ulcer of unspecified part of left lower leg with unspecified severity: Secondary | ICD-10-CM

## 2014-02-06 DIAGNOSIS — Z66 Do not resuscitate: Secondary | ICD-10-CM | POA: Diagnosis present

## 2014-02-06 DIAGNOSIS — L03119 Cellulitis of unspecified part of limb: Secondary | ICD-10-CM

## 2014-02-06 DIAGNOSIS — M8450XA Pathological fracture in neoplastic disease, unspecified site, initial encounter for fracture: Secondary | ICD-10-CM

## 2014-02-06 DIAGNOSIS — Z91040 Latex allergy status: Secondary | ICD-10-CM

## 2014-02-06 DIAGNOSIS — Z9119 Patient's noncompliance with other medical treatment and regimen: Secondary | ICD-10-CM

## 2014-02-06 DIAGNOSIS — S129XXS Fracture of neck, unspecified, sequela: Secondary | ICD-10-CM

## 2014-02-06 DIAGNOSIS — L97509 Non-pressure chronic ulcer of other part of unspecified foot with unspecified severity: Secondary | ICD-10-CM | POA: Diagnosis present

## 2014-02-06 DIAGNOSIS — Z598 Other problems related to housing and economic circumstances: Secondary | ICD-10-CM

## 2014-02-06 DIAGNOSIS — R202 Paresthesia of skin: Secondary | ICD-10-CM

## 2014-02-06 DIAGNOSIS — G893 Neoplasm related pain (acute) (chronic): Secondary | ICD-10-CM | POA: Diagnosis present

## 2014-02-06 DIAGNOSIS — Z885 Allergy status to narcotic agent status: Secondary | ICD-10-CM

## 2014-02-06 DIAGNOSIS — T4275XA Adverse effect of unspecified antiepileptic and sedative-hypnotic drugs, initial encounter: Secondary | ICD-10-CM | POA: Diagnosis present

## 2014-02-06 DIAGNOSIS — IMO0002 Reserved for concepts with insufficient information to code with codable children: Secondary | ICD-10-CM

## 2014-02-06 DIAGNOSIS — C7800 Secondary malignant neoplasm of unspecified lung: Secondary | ICD-10-CM | POA: Diagnosis present

## 2014-02-06 DIAGNOSIS — G929 Unspecified toxic encephalopathy: Secondary | ICD-10-CM | POA: Diagnosis present

## 2014-02-06 DIAGNOSIS — C7931 Secondary malignant neoplasm of brain: Secondary | ICD-10-CM | POA: Diagnosis present

## 2014-02-06 DIAGNOSIS — Z880 Allergy status to penicillin: Secondary | ICD-10-CM

## 2014-02-06 DIAGNOSIS — I252 Old myocardial infarction: Secondary | ICD-10-CM

## 2014-02-06 DIAGNOSIS — F4325 Adjustment disorder with mixed disturbance of emotions and conduct: Secondary | ICD-10-CM | POA: Diagnosis present

## 2014-02-06 DIAGNOSIS — T6591XA Toxic effect of unspecified substance, accidental (unintentional), initial encounter: Principal | ICD-10-CM | POA: Diagnosis present

## 2014-02-06 DIAGNOSIS — F0391 Unspecified dementia with behavioral disturbance: Secondary | ICD-10-CM

## 2014-02-06 DIAGNOSIS — D489 Neoplasm of uncertain behavior, unspecified: Secondary | ICD-10-CM

## 2014-02-06 DIAGNOSIS — G4700 Insomnia, unspecified: Secondary | ICD-10-CM | POA: Diagnosis not present

## 2014-02-06 DIAGNOSIS — G9340 Encephalopathy, unspecified: Secondary | ICD-10-CM

## 2014-02-06 DIAGNOSIS — C7949 Secondary malignant neoplasm of other parts of nervous system: Secondary | ICD-10-CM

## 2014-02-06 DIAGNOSIS — F4323 Adjustment disorder with mixed anxiety and depressed mood: Secondary | ICD-10-CM

## 2014-02-06 MED ORDER — SODIUM CHLORIDE 0.9 % IV BOLUS (SEPSIS)
2000.0000 mL | Freq: Once | INTRAVENOUS | Status: AC
  Administered 2014-02-06: 1000 mL via INTRAVENOUS

## 2014-02-06 MED ORDER — DEXAMETHASONE SODIUM PHOSPHATE 10 MG/ML IJ SOLN
4.0000 mg | Freq: Once | INTRAMUSCULAR | Status: AC
  Administered 2014-02-06: 4 mg via INTRAVENOUS
  Filled 2014-02-06: qty 1

## 2014-02-06 MED ORDER — SODIUM CHLORIDE 0.9 % IV BOLUS (SEPSIS)
1000.0000 mL | Freq: Once | INTRAVENOUS | Status: AC
  Administered 2014-02-06: 1000 mL via INTRAVENOUS

## 2014-02-06 NOTE — ED Provider Notes (Signed)
CSN: [id number]     Arrival date & time 02/06/14  2146 History   First MD Initiated Contact with Patient 02/06/14 2207     Chief Complaint  Patient presents with  . 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 with confusion paranoia and agitation anxiety aggressive behavior at baseline was transferred to the emergency department today from an outlying hospital where the patient apparently had presented with altered mental status for the last couple of days, history of present illness is unable to be obtained from the patient due to his altered mental status, it is uncertain if the patient is now at his baseline mental status or not, he is oriented to person only not to time or to place and does not know where he was prior to arrival to this emergency department or why he is here, he denies any complaints, he is a history of chronic severe pain with chronic severe headaches chronic severe neck pain but he states those are now bothering him today, he denies any complaints whatsoever himself, he denies any chest pain shortness breath abdominal pain vomiting new focal weakness or other concerns. He has no idea why he was sent to the emergency department or where he is now. He has tangential speech and keeps providing rambling story is about the government various agencies and security but denies hallucinations devised denies suicidal or homicidal ideation. Oncology recommends patient be admitted to the hospitalist service to start Decadron 4 mg IV every 6 hours, plan MRI tomorrow, plan oncology and radiation oncology consults tomorrow. Past Medical History  Diagnosis Date  . Cancer     Thyroid  . Thyroid disease   . Hypertension   . Pneumonia    Past Surgical History  Procedure Laterality Date  . Tendon repair    .  Thyroidectomy     Family History  Problem Relation Age of Onset  . Diabetes Mother   . Diabetes Brother    History  Substance Use Topics  . Smoking status: Former Smoker -- 10 years    Types: Cigarettes    Quit date: 08/29/2011  . Smokeless tobacco: Never Used  . Alcohol Use: 4.8 oz/week    8 Cans of beer per week     Comment: occ    Review of Systems  Unable to perform ROS     Allergies  Penicillins; Latex; and Morphine and related  Home Medications   Prior to Admission medications   Medication Sig Start Date End Date Taking? Authorizing Provider  clindamycin (CLEOCIN) 300 MG capsule Take 1 capsule (300 mg total) by mouth 3 (three) times daily. 01/27/14  Yes Theodis Blaze, MD  docusate sodium (COLACE) 100 MG capsule Take 100 mg by mouth 2 (two) times daily as needed for mild constipation.   Yes Historical Provider, MD  furosemide (LASIX) 20 MG tablet Take 1 tablet (20 mg total) by mouth daily. 01/27/14  Yes Theodis Blaze, MD  HYDROmorphone (DILAUDID) 4 MG tablet Take 1 tablet (4 mg total) by mouth every 4 (four) hours as needed for severe pain. 01/27/14  Yes Theodis Blaze, MD  ibuprofen (ADVIL,MOTRIN) 200 MG tablet Take 200-400 mg by mouth every 6 (six) hours as needed for mild pain.   Yes Historical Provider, MD  levothyroxine (SYNTHROID, LEVOTHROID) 100 MCG tablet Take 100 mcg by mouth daily before breakfast.  Yes Historical Provider, MD  LORazepam (ATIVAN) 1 MG tablet Take 1 tablet (1 mg total) by mouth every 8 (eight) hours as needed for anxiety. 01/27/14  Yes Theodis Blaze, MD  traZODone (DESYREL) 100 MG tablet Take 100 mg by mouth once as needed for sleep.   Yes Historical Provider, MD  morphine (MS CONTIN) 60 MG 12 hr tablet Take 1 tablet (60 mg total) by mouth every 12 (twelve) hours. 01/27/14   Theodis Blaze, MD   BP 135/68  Pulse 104  Temp(Src) 98 F (36.7 C) (Oral)  Resp 20  Ht 6\' 4"  (1.93 m)  Wt 189 lb 4.8 oz (85.866 kg)  BMI 23.05 kg/m2  SpO2 96% Physical  Exam  Nursing note and vitals reviewed. Constitutional:  Awake, alert, nontoxic appearance.  HENT:  Head: Atraumatic.  Eyes: Right eye exhibits no discharge. Left eye exhibits no discharge.  Neck: Neck supple.  Cardiovascular: Regular rhythm.   No murmur heard. Mildly tachycardic  Pulmonary/Chest: Effort normal and breath sounds normal. No respiratory distress. He has no wheezes. He has no rales. He exhibits no tenderness.  Abdominal: Soft. Bowel sounds are normal. He exhibits no distension and no mass. There is no tenderness. There is no rebound and no guarding.  Musculoskeletal: He exhibits no edema and no tenderness.  Baseline ROM, no obvious new focal weakness.  Neurological: He is alert.  Mental status and motor strength appears baseline for patient and situation. Alert anxious oriented to person only not to time or to place or to reason for ED visit.  Skin: No rash noted.  Psychiatric:  Anxious agitated but denies hallucinations denies threat to harm self or others    ED Course  Procedures (including critical care time) Labs pending; await results for admit. Handoff performed. Labs Review Labs Reviewed  CBC WITH DIFFERENTIAL - Abnormal; Notable for the following:    RBC 3.56 (*)    Hemoglobin 10.9 (*)    HCT 32.1 (*)    Neutrophils Relative % 94 (*)    Lymphocytes Relative 5 (*)    Lymphs Abs 0.3 (*)    Monocytes Relative 1 (*)    All other components within normal limits  COMPREHENSIVE METABOLIC PANEL - Abnormal; Notable for the following:    Sodium 136 (*)    Potassium 3.4 (*)    Glucose, Bld 135 (*)    BUN 3 (*)    Total Protein 5.7 (*)    Albumin 3.0 (*)    Total Bilirubin 0.2 (*)    All other components within normal limits  URINALYSIS, ROUTINE W REFLEX MICROSCOPIC - Abnormal; Notable for the following:    APPearance CLOUDY (*)    Ketones, ur 15 (*)    All other components within normal limits  COMPREHENSIVE METABOLIC PANEL - Abnormal; Notable for the  following:    Sodium 135 (*)    Glucose, Bld 143 (*)    BUN 3 (*)    Albumin 3.2 (*)    Total Bilirubin 0.2 (*)    All other components within normal limits  CBC WITH DIFFERENTIAL - Abnormal; Notable for the following:    WBC 3.8 (*)    RBC 3.69 (*)    Hemoglobin 11.2 (*)    HCT 33.6 (*)    Neutrophils Relative % 91 (*)    Lymphocytes Relative 7 (*)    Lymphs Abs 0.3 (*)    Monocytes Relative 2 (*)    All other components within normal limits  BASIC METABOLIC PANEL - Abnormal; Notable for the following:    Sodium 133 (*)    Glucose, Bld 147 (*)    GFR calc non Af Amer 75 (*)    GFR calc Af Amer 87 (*)    All other components within normal limits  CBC - Abnormal; Notable for the following:    RBC 3.97 (*)    Hemoglobin 12.0 (*)    HCT 36.5 (*)    Platelets 417 (*)    All other components within normal limits  TSH - Abnormal; Notable for the following:    TSH 5.800 (*)    All other components within normal limits  T3, FREE  T4, FREE  RPR  HIV ANTIBODY (ROUTINE TESTING)  CBC  BASIC METABOLIC PANEL  I-STAT CG4 LACTIC ACID, ED    Imaging Review No results found.   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

## 2014-02-06 NOTE — Telephone Encounter (Signed)
Please note the last telephone encounter was entered in error.  I was not contacted by the nurse triage line regarding a fever in this patient.  Loma Newton

## 2014-02-06 NOTE — Telephone Encounter (Signed)
I received a call from Dr Carlus Pavlov at [facility] ER.  He stated that pt presented with 2 days of worsening AMS.  VS and labs WNL except tox screen with opiates.  Pt has 41mm midline lesion in the posterior fossa w/o shift or edema on head CT there.  Per Dr Carlus Pavlov, pt is slow to respond, confused and complains of pain but has no localizing neuro signs.  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  Difficult to ascertain if this is a new or old brain lesion and if opiate medication could be contributing to AMS vs progression of disease vs other cause.  Discussed case with Dr Alvy Bimler.  I recommended to Dr Carlus Pavlov that Mr Ureta be transferred to [facility] ER.  Discussed case with Elvina Sidle ER MD and recommended symptomatic treatment, admission to hospitalist service (3W), Decadron 4mg  IV q 6 hrs, brain MRI tomorrow (unless sensorium clears), and med-onc and rad-onc consults tomorrow.  Loma Newton, MD

## 2014-02-06 NOTE — ED Notes (Signed)
Patient transported to CT 

## 2014-02-06 NOTE — ED Notes (Signed)
Per EMS , pt. From [facility] for metastatic cancer to thyroid and brain, alert and oriented x4. Received 2mg  IV of dilaudid prior to transport, denied pain upon arrival, no SOB.

## 2014-02-06 NOTE — ED Notes (Signed)
Bed: IR48 Expected date:  Expected time:  Means of arrival:  Comments: ems-metastatic cancer

## 2014-02-06 NOTE — ED Notes (Signed)
Pt.'s sister, Kaiel Weide called and updated of pt.'s arrival to ED. Verbalized satisfaction. Tel No. To contact # [phone].

## 2014-02-06 NOTE — Telephone Encounter (Signed)
Was contacted by nurse triage line.  Ms Frank Ward has a hx of breast cancer with [facility] chemotherapy on 01/27/14.  Started on cipro 6/22 for mild neutropenia.  Pt notes temp of 100.8 today with headache.  Pt is 30 minutes away.  Recommended that pt come to ER for evaluation of possible neutropenic fever.  Discussed case with Dr Placido Sou in the ER.  Loma Newton, MD

## 2014-02-06 NOTE — ED Notes (Signed)
Patient transported to X-ray 

## 2014-02-07 ENCOUNTER — Ambulatory Visit
Admission: RE | Admit: 2014-02-07 | Discharge: 2014-02-07 | Disposition: A | Discharge status: Home / Self Care | Payer: Medicaid Other | Source: Ambulatory Visit | Attending: Radiation Oncology | Admitting: Radiation Oncology

## 2014-02-07 ENCOUNTER — Ambulatory Visit: Payer: Medicaid Other

## 2014-02-07 ENCOUNTER — Encounter (HOSPITAL_COMMUNITY): Payer: Self-pay | Admitting: Internal Medicine

## 2014-02-07 DIAGNOSIS — C7951 Secondary malignant neoplasm of bone: Secondary | ICD-10-CM

## 2014-02-07 DIAGNOSIS — F39 Unspecified mood [affective] disorder: Secondary | ICD-10-CM | POA: Diagnosis present

## 2014-02-07 DIAGNOSIS — Z885 Allergy status to narcotic agent status: Secondary | ICD-10-CM | POA: Diagnosis not present

## 2014-02-07 DIAGNOSIS — R7309 Other abnormal glucose: Secondary | ICD-10-CM | POA: Diagnosis present

## 2014-02-07 DIAGNOSIS — I252 Old myocardial infarction: Secondary | ICD-10-CM | POA: Diagnosis not present

## 2014-02-07 DIAGNOSIS — C7931 Secondary malignant neoplasm of brain: Secondary | ICD-10-CM

## 2014-02-07 DIAGNOSIS — F03918 Unspecified dementia, unspecified severity, with other behavioral disturbance: Secondary | ICD-10-CM | POA: Diagnosis present

## 2014-02-07 DIAGNOSIS — Z515 Encounter for palliative care: Secondary | ICD-10-CM | POA: Diagnosis not present

## 2014-02-07 DIAGNOSIS — C7800 Secondary malignant neoplasm of unspecified lung: Secondary | ICD-10-CM | POA: Diagnosis present

## 2014-02-07 DIAGNOSIS — E890 Postprocedural hypothyroidism: Secondary | ICD-10-CM | POA: Diagnosis present

## 2014-02-07 DIAGNOSIS — C73 Malignant neoplasm of thyroid gland: Secondary | ICD-10-CM

## 2014-02-07 DIAGNOSIS — G4700 Insomnia, unspecified: Secondary | ICD-10-CM | POA: Diagnosis not present

## 2014-02-07 DIAGNOSIS — Z87891 Personal history of nicotine dependence: Secondary | ICD-10-CM | POA: Diagnosis not present

## 2014-02-07 DIAGNOSIS — Z833 Family history of diabetes mellitus: Secondary | ICD-10-CM | POA: Diagnosis not present

## 2014-02-07 DIAGNOSIS — T6591XA Toxic effect of unspecified substance, accidental (unintentional), initial encounter: Secondary | ICD-10-CM | POA: Diagnosis present

## 2014-02-07 DIAGNOSIS — F411 Generalized anxiety disorder: Secondary | ICD-10-CM | POA: Diagnosis present

## 2014-02-07 DIAGNOSIS — L97909 Non-pressure chronic ulcer of unspecified part of unspecified lower leg with unspecified severity: Secondary | ICD-10-CM

## 2014-02-07 DIAGNOSIS — F29 Unspecified psychosis not due to a substance or known physiological condition: Secondary | ICD-10-CM | POA: Diagnosis present

## 2014-02-07 DIAGNOSIS — C7952 Secondary malignant neoplasm of bone marrow: Principal | ICD-10-CM

## 2014-02-07 DIAGNOSIS — L97509 Non-pressure chronic ulcer of other part of unspecified foot with unspecified severity: Secondary | ICD-10-CM | POA: Diagnosis present

## 2014-02-07 DIAGNOSIS — M8448XA Pathological fracture, other site, initial encounter for fracture: Secondary | ICD-10-CM | POA: Diagnosis present

## 2014-02-07 DIAGNOSIS — T4275XA Adverse effect of unspecified antiepileptic and sedative-hypnotic drugs, initial encounter: Secondary | ICD-10-CM | POA: Diagnosis present

## 2014-02-07 DIAGNOSIS — Z91040 Latex allergy status: Secondary | ICD-10-CM | POA: Diagnosis not present

## 2014-02-07 DIAGNOSIS — L02619 Cutaneous abscess of unspecified foot: Secondary | ICD-10-CM | POA: Diagnosis present

## 2014-02-07 DIAGNOSIS — F0391 Unspecified dementia with behavioral disturbance: Secondary | ICD-10-CM | POA: Diagnosis present

## 2014-02-07 DIAGNOSIS — Z66 Do not resuscitate: Secondary | ICD-10-CM | POA: Diagnosis present

## 2014-02-07 DIAGNOSIS — G893 Neoplasm related pain (acute) (chronic): Secondary | ICD-10-CM

## 2014-02-07 DIAGNOSIS — Z79899 Other long term (current) drug therapy: Secondary | ICD-10-CM | POA: Diagnosis not present

## 2014-02-07 DIAGNOSIS — M545 Low back pain, unspecified: Secondary | ICD-10-CM | POA: Diagnosis present

## 2014-02-07 DIAGNOSIS — G929 Unspecified toxic encephalopathy: Secondary | ICD-10-CM | POA: Diagnosis present

## 2014-02-07 DIAGNOSIS — C7949 Secondary malignant neoplasm of other parts of nervous system: Secondary | ICD-10-CM | POA: Diagnosis present

## 2014-02-07 DIAGNOSIS — T380X5A Adverse effect of glucocorticoids and synthetic analogues, initial encounter: Secondary | ICD-10-CM | POA: Diagnosis present

## 2014-02-07 DIAGNOSIS — D630 Anemia in neoplastic disease: Secondary | ICD-10-CM | POA: Diagnosis present

## 2014-02-07 DIAGNOSIS — Z791 Long term (current) use of non-steroidal anti-inflammatories (NSAID): Secondary | ICD-10-CM | POA: Diagnosis not present

## 2014-02-07 DIAGNOSIS — T4271XA Poisoning by unspecified antiepileptic and sedative-hypnotic drugs, accidental (unintentional), initial encounter: Secondary | ICD-10-CM | POA: Diagnosis present

## 2014-02-07 DIAGNOSIS — F4325 Adjustment disorder with mixed disturbance of emotions and conduct: Secondary | ICD-10-CM | POA: Diagnosis present

## 2014-02-07 DIAGNOSIS — F22 Delusional disorders: Secondary | ICD-10-CM | POA: Diagnosis not present

## 2014-02-07 DIAGNOSIS — L97929 Non-pressure chronic ulcer of unspecified part of left lower leg with unspecified severity: Secondary | ICD-10-CM | POA: Diagnosis present

## 2014-02-07 DIAGNOSIS — Z91199 Patient's noncompliance with other medical treatment and regimen due to unspecified reason: Secondary | ICD-10-CM | POA: Diagnosis not present

## 2014-02-07 DIAGNOSIS — Z5987 Material hardship: Secondary | ICD-10-CM | POA: Diagnosis not present

## 2014-02-07 DIAGNOSIS — R4182 Altered mental status, unspecified: Secondary | ICD-10-CM | POA: Insufficient documentation

## 2014-02-07 DIAGNOSIS — G92 Toxic encephalopathy: Secondary | ICD-10-CM | POA: Diagnosis present

## 2014-02-07 DIAGNOSIS — I10 Essential (primary) hypertension: Secondary | ICD-10-CM | POA: Diagnosis present

## 2014-02-07 DIAGNOSIS — Z880 Allergy status to penicillin: Secondary | ICD-10-CM | POA: Diagnosis not present

## 2014-02-07 DIAGNOSIS — G9340 Encephalopathy, unspecified: Secondary | ICD-10-CM

## 2014-02-07 DIAGNOSIS — T426X4A Poisoning by other antiepileptic and sedative-hypnotic drugs, undetermined, initial encounter: Secondary | ICD-10-CM | POA: Diagnosis present

## 2014-02-07 LAB — CBC WITH DIFFERENTIAL/PLATELET
BASOS PCT: 0 % (ref 0–1)
Basophils Absolute: 0 10*3/uL (ref 0.0–0.1)
Basophils Absolute: 0 10*3/uL (ref 0.0–0.1)
Basophils Relative: 0 % (ref 0–1)
EOS ABS: 0 10*3/uL (ref 0.0–0.7)
EOS PCT: 0 % (ref 0–5)
Eosinophils Absolute: 0 10*3/uL (ref 0.0–0.7)
Eosinophils Relative: 0 % (ref 0–5)
HCT: 32.1 % — ABNORMAL LOW (ref 39.0–52.0)
HCT: 33.6 % — ABNORMAL LOW (ref 39.0–52.0)
HEMOGLOBIN: 10.9 g/dL — AB (ref 13.0–17.0)
Hemoglobin: 11.2 g/dL — ABNORMAL LOW (ref 13.0–17.0)
LYMPHS ABS: 0.3 10*3/uL — AB (ref 0.7–4.0)
LYMPHS ABS: 0.3 10*3/uL — AB (ref 0.7–4.0)
Lymphocytes Relative: 5 % — ABNORMAL LOW (ref 12–46)
Lymphocytes Relative: 7 % — ABNORMAL LOW (ref 12–46)
MCH: 30.6 pg (ref 26.0–34.0)
MCH: 30.4 pg (ref 26.0–34.0)
MCHC: 34 g/dL (ref 30.0–36.0)
MCHC: 33.3 g/dL (ref 30.0–36.0)
MCV: 90.2 fL (ref 78.0–100.0)
MCV: 91.1 fL (ref 78.0–100.0)
MONO ABS: 0.1 10*3/uL (ref 0.1–1.0)
MONOS PCT: 1 % — AB (ref 3–12)
Monocytes Absolute: 0.1 10*3/uL (ref 0.1–1.0)
Monocytes Relative: 2 % — ABNORMAL LOW (ref 3–12)
NEUTROS ABS: 5.2 10*3/uL (ref 1.7–7.7)
NEUTROS PCT: 91 % — AB (ref 43–77)
Neutro Abs: 3.5 10*3/uL (ref 1.7–7.7)
Neutrophils Relative %: 94 % — ABNORMAL HIGH (ref 43–77)
PLATELETS: 304 10*3/uL (ref 150–400)
Platelets: 314 10*3/uL (ref 150–400)
RBC: 3.56 MIL/uL — AB (ref 4.22–5.81)
RBC: 3.69 MIL/uL — AB (ref 4.22–5.81)
RDW: 13.8 % (ref 11.5–15.5)
RDW: 13.9 % (ref 11.5–15.5)
WBC: 5.5 10*3/uL (ref 4.0–10.5)
WBC: 3.8 10*3/uL — ABNORMAL LOW (ref 4.0–10.5)

## 2014-02-07 LAB — COMPREHENSIVE METABOLIC PANEL
ALK PHOS: 86 U/L (ref 39–117)
ALT: 19 U/L (ref 0–53)
ALT: 20 U/L (ref 0–53)
AST: 14 U/L (ref 0–37)
AST: 16 U/L (ref 0–37)
Albumin: 3 g/dL — ABNORMAL LOW (ref 3.5–5.2)
Albumin: 3.2 g/dL — ABNORMAL LOW (ref 3.5–5.2)
Alkaline Phosphatase: 92 U/L (ref 39–117)
BILIRUBIN TOTAL: 0.2 mg/dL — AB (ref 0.3–1.2)
BILIRUBIN TOTAL: 0.2 mg/dL — AB (ref 0.3–1.2)
BUN: 3 mg/dL — ABNORMAL LOW (ref 6–23)
BUN: 3 mg/dL — AB (ref 6–23)
CO2: 22 meq/L (ref 19–32)
CO2: 23 mEq/L (ref 19–32)
CREATININE: 0.87 mg/dL (ref 0.50–1.35)
Calcium: 8.6 mg/dL (ref 8.4–10.5)
Calcium: 9 mg/dL (ref 8.4–10.5)
Chloride: 100 mEq/L (ref 96–112)
Chloride: 99 mEq/L (ref 96–112)
Creatinine, Ser: 0.84 mg/dL (ref 0.50–1.35)
GFR calc Af Amer: >90 mL/min (ref >90)
GFR calc Af Amer: >90 mL/min (ref >90)
GLUCOSE: 135 mg/dL — AB (ref 70–99)
Glucose, Bld: 143 mg/dL — ABNORMAL HIGH (ref 70–99)
Potassium: 3.4 mEq/L — ABNORMAL LOW (ref 3.7–5.3)
Potassium: 3.8 mEq/L (ref 3.7–5.3)
Sodium: 136 mEq/L — ABNORMAL LOW (ref 137–147)
Sodium: 135 mEq/L — ABNORMAL LOW (ref 137–147)
TOTAL PROTEIN: 6.2 g/dL (ref 6.0–8.3)
Total Protein: 5.7 g/dL — ABNORMAL LOW (ref 6.0–8.3)

## 2014-02-07 LAB — URINALYSIS, ROUTINE W REFLEX MICROSCOPIC
Bilirubin Urine: NEGATIVE
Glucose, UA: NEGATIVE mg/dL
Hgb urine dipstick: NEGATIVE
KETONES UR: 15 mg/dL — AB
LEUKOCYTES UA: NEGATIVE
Nitrite: NEGATIVE
Protein, ur: NEGATIVE mg/dL
Specific Gravity, Urine: 1.011 (ref 1.005–1.030)
UROBILINOGEN UA: 1 mg/dL (ref 0.0–1.0)
pH: 5.5 (ref 5.0–8.0)

## 2014-02-07 LAB — I-STAT CG4 LACTIC ACID, ED: LACTIC ACID, VENOUS: 0.55 mmol/L (ref 0.5–2.2)

## 2014-02-07 MED ORDER — HYDROMORPHONE HCL PF 1 MG/ML IJ SOLN
1.0000 mg | Freq: Once | INTRAMUSCULAR | Status: AC
  Administered 2014-02-07: 1 mg via INTRAVENOUS
  Filled 2014-02-07: qty 1

## 2014-02-07 MED ORDER — MORPHINE SULFATE ER 30 MG PO TBCR
60.0000 mg | EXTENDED_RELEASE_TABLET | Freq: Two times a day (BID) | ORAL | Status: DC
  Administered 2014-02-07 – 2014-02-16 (×19): 60 mg via ORAL
  Administered 2014-02-16: 30 mg via ORAL
  Administered 2014-02-17 (×2): 60 mg via ORAL
  Administered 2014-02-18: 30 mg via ORAL
  Administered 2014-02-18: 60 mg via ORAL
  Administered 2014-02-19: 30 mg via ORAL
  Administered 2014-02-19: 60 mg via ORAL
  Administered 2014-02-20: 30 mg via ORAL
  Administered 2014-02-20: 60 mg via ORAL
  Administered 2014-02-21: 30 mg via ORAL
  Administered 2014-02-21: 60 mg via ORAL
  Filled 2014-02-07 (×6): qty 2
  Filled 2014-02-07: qty 4
  Filled 2014-02-07 (×24): qty 2

## 2014-02-07 MED ORDER — IBUPROFEN 200 MG PO TABS
200.0000 mg | ORAL_TABLET | Freq: Four times a day (QID) | ORAL | Status: DC | PRN
  Administered 2014-02-13: 400 mg via ORAL
  Filled 2014-02-07 (×2): qty 2

## 2014-02-07 MED ORDER — DEXAMETHASONE SODIUM PHOSPHATE 4 MG/ML IJ SOLN
4.0000 mg | Freq: Four times a day (QID) | INTRAMUSCULAR | Status: DC
Start: 2014-02-07 — End: 2014-02-10
  Administered 2014-02-07 – 2014-02-10 (×12): 4 mg via INTRAVENOUS
  Filled 2014-02-07 (×17): qty 1

## 2014-02-07 MED ORDER — DOCUSATE SODIUM 100 MG PO CAPS
100.0000 mg | ORAL_CAPSULE | Freq: Two times a day (BID) | ORAL | Status: DC | PRN
  Administered 2014-02-21: 100 mg via ORAL
  Filled 2014-02-07 (×3): qty 1

## 2014-02-07 MED ORDER — HYDROMORPHONE HCL PF 1 MG/ML IJ SOLN
INTRAMUSCULAR | Status: AC
Start: 2014-02-07 — End: 2014-02-07
  Filled 2014-02-07: qty 1

## 2014-02-07 MED ORDER — LORAZEPAM 1 MG PO TABS
1.0000 mg | ORAL_TABLET | Freq: Three times a day (TID) | ORAL | Status: DC | PRN
Start: 2014-02-07 — End: 2014-02-22
  Administered 2014-02-09 – 2014-02-12 (×4): 1 mg via ORAL
  Filled 2014-02-07 (×4): qty 1

## 2014-02-07 MED ORDER — HYDROMORPHONE HCL PF 1 MG/ML IJ SOLN
1.0000 mg | INTRAMUSCULAR | Status: DC | PRN
  Administered 2014-02-07 (×3): 1 mg via INTRAVENOUS
  Filled 2014-02-07 (×2): qty 1

## 2014-02-07 MED ORDER — HYDROMORPHONE HCL PF 4 MG/ML IJ SOLN: 2.0000 mg/kg | INTRAMUSCULAR | Status: DC | PRN

## 2014-02-07 MED ORDER — LEVOTHYROXINE SODIUM 100 MCG PO TABS
100.0000 ug | ORAL_TABLET | Freq: Every day | ORAL | Status: DC
  Administered 2014-02-07 – 2014-02-12 (×6): 100 ug via ORAL
  Filled 2014-02-07 (×7): qty 1

## 2014-02-07 MED ORDER — ENOXAPARIN SODIUM 40 MG/0.4ML ~~LOC~~ SOLN
40.0000 mg | SUBCUTANEOUS | Status: DC
  Administered 2014-02-09 – 2014-02-21 (×11): 40 mg via SUBCUTANEOUS
  Filled 2014-02-07 (×16): qty 0.4

## 2014-02-07 MED ORDER — ACETAMINOPHEN 650 MG RE SUPP: 650.0000 mg | Freq: Four times a day (QID) | RECTAL | Status: DC | PRN

## 2014-02-07 MED ORDER — ONDANSETRON HCL 4 MG PO TABS: 4.0000 mg | ORAL_TABLET | Freq: Four times a day (QID) | ORAL | Status: DC | PRN

## 2014-02-07 MED ORDER — PANTOPRAZOLE SODIUM 40 MG PO TBEC
40.0000 mg | DELAYED_RELEASE_TABLET | Freq: Every day | ORAL | Status: DC
  Administered 2014-02-07 – 2014-02-22 (×13): 40 mg via ORAL
  Filled 2014-02-07 (×16): qty 1

## 2014-02-07 MED ORDER — ONDANSETRON HCL 4 MG/2ML IJ SOLN
4.0000 mg | Freq: Four times a day (QID) | INTRAMUSCULAR | Status: DC | PRN
  Filled 2014-02-07: qty 2

## 2014-02-07 MED ORDER — HYDROMORPHONE HCL PF 2 MG/ML IJ SOLN
2.0000 mg | INTRAMUSCULAR | Status: DC | PRN
  Administered 2014-02-07 – 2014-02-11 (×16): 2 mg via INTRAVENOUS
  Filled 2014-02-07 (×16): qty 1

## 2014-02-07 MED ORDER — FUROSEMIDE 20 MG PO TABS
20.0000 mg | ORAL_TABLET | Freq: Every day | ORAL | Status: DC
  Administered 2014-02-07 – 2014-02-21 (×13): 20 mg via ORAL
  Filled 2014-02-07 (×16): qty 1

## 2014-02-07 MED ORDER — LORAZEPAM 2 MG/ML IJ SOLN
1.0000 mg | Freq: Once | INTRAMUSCULAR | Status: AC
  Administered 2014-02-07: 1 mg via INTRAVENOUS

## 2014-02-07 MED ORDER — TRAZODONE HCL 50 MG PO TABS: 100.0000 mg | ORAL_TABLET | Freq: Once | ORAL | Status: AC | PRN

## 2014-02-07 MED ORDER — SODIUM CHLORIDE 0.9 % IV SOLN
INTRAVENOUS | Status: DC
  Administered 2014-02-07 (×2): via INTRAVENOUS

## 2014-02-07 MED ORDER — QUETIAPINE FUMARATE ER 50 MG PO TB24
100.0000 mg | ORAL_TABLET | Freq: Every day | ORAL | Status: DC
  Administered 2014-02-07 – 2014-02-15 (×9): 100 mg via ORAL
  Administered 2014-02-16: 50 mg via ORAL
  Administered 2014-02-17: 100 mg via ORAL
  Administered 2014-02-18 – 2014-02-20 (×3): 50 mg via ORAL
  Filled 2014-02-07 (×16): qty 2

## 2014-02-07 MED ORDER — ACETAMINOPHEN 325 MG PO TABS
650.0000 mg | ORAL_TABLET | Freq: Four times a day (QID) | ORAL | Status: DC | PRN
  Administered 2014-02-14: 650 mg via ORAL
  Filled 2014-02-07: qty 2

## 2014-02-07 NOTE — ED Notes (Signed)
Attempted blood draw unable, RN aware

## 2014-02-07 NOTE — H&P (Signed)
[facility] History and Physical  Grason Shondale Quinley NAT:[id number] DOB: 11/07/1970 DOA: 02/06/2014  Referring physician: ER physician. PCP: No PCP Per Patient  Chief Complaint: Confusion.  HPI: Frank Ward is a 43 y.o. 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 Initially in the ER at [facility] patient was found to be confused and CT head did not show anything acute except for the known metastasis. Patient was nonfocal on exam. ED physician had discussed with on-call oncologist and at this time they have advised to admit patient place him on Decadron and pain relief medications and consult patient's radiation oncologist in a.m. On exam patient presently is alert awake oriented to time place and person. Moves all extremities. Still has pain particularly in the low back. Patient denies any chest pain short breath nausea vomiting abdominal pain. During last stay patient had behavioral issues and psychiatric consult was pain and at that time psychiatrist had recommended when necessary IV stat IM Haldol and Abilify. Patient during the last he also had left lower extremity cellulitis and at this time patient does have a small ulcer on the left dorsal aspect of his left foot. There is no active discharge at this time.   Review of Systems: As presented in the history of presenting illness, rest negative.  Past Medical History  Diagnosis Date  . Cancer     Thyroid  . Thyroid disease   . Hypertension   . Pneumonia    Past Surgical History  Procedure Laterality Date  . Tendon repair    . Thyroidectomy     Social History:  reports that he quit smoking about 2 years ago. His smoking use included Cigarettes. He smoked 0.00 packs per  day for 10 years. He has never used smokeless tobacco. He reports that he drinks about 4.8 ounces of alcohol per week. He reports that he uses illicit drugs (Marijuana). Where does patient live home. Can patient participate in ADLs? Yes.  Allergies  Allergen Reactions  . Penicillins Anaphylaxis, Hives and Itching  . Latex Hives and Itching  . Morphine And Related Hives, Nausea And Vomiting and Swelling    Swelling in body    Family History:  Family History  Problem Relation Age of Onset  . Diabetes Mother   . Diabetes Brother       Prior to Admission medications   Medication Sig Start Date End Date Taking? Authorizing Provider  clindamycin (CLEOCIN) 300 MG capsule Take 1 capsule (300 mg total) by mouth 3 (three) times daily. 01/27/14  Yes Theodis Blaze, MD  docusate sodium (COLACE) 100 MG capsule Take 100 mg by mouth 2 (two) times daily as needed for mild constipation.   Yes Historical Provider, MD  furosemide (LASIX) 20 MG tablet Take 1 tablet (20 mg total) by mouth daily. 01/27/14  Yes Theodis Blaze, MD  HYDROmorphone (DILAUDID) 4 MG tablet Take 1 tablet (4 mg total) by mouth every 4 (four) hours as needed for severe pain. 01/27/14  Yes Theodis Blaze, MD  ibuprofen (ADVIL,MOTRIN) 200 MG tablet Take 200-400 mg by mouth every 6 (six) hours as needed for mild pain.   Yes Historical Provider, MD  levothyroxine (SYNTHROID, LEVOTHROID) 100 MCG tablet Take 100 mcg by mouth daily before breakfast.  Yes Historical Provider, MD  LORazepam (ATIVAN) 1 MG tablet Take 1 tablet (1 mg total) by mouth every 8 (eight) hours as needed for anxiety. 01/27/14  Yes Theodis Blaze, MD  traZODone (DESYREL) 100 MG tablet Take 100 mg by mouth once as needed for sleep.   Yes Historical Provider, MD  morphine (MS CONTIN) 60 MG 12 hr tablet Take 1 tablet (60 mg total) by mouth every 12 (twelve) hours. 01/27/14   Theodis Blaze, MD    Physical Exam: Filed Vitals:   02/06/14 2146 02/07/14 0305 02/07/14 0433  BP:  133/79 110/81 157/97  Pulse: 105 104 104  Temp: 97.9 F (36.6 C) 97.9 F (36.6 C)   TempSrc: Oral Oral   Resp:  18 20  SpO2: 97% 97% 99%     General:  Well-developed and nourished.  Eyes: Anicteric no pallor.  ENT: No discharge from the ears eyes nose mouth.  Neck: No mass felt and no neck rigidity.  Cardiovascular: S1-S2 heard.  Respiratory: No rhonchi or crepitations.  Abdomen: Soft nontender bowel sounds present. No guarding rigidity.  Skin: Small ulcer on the left dorsal aspect of the foot no active discharge seen.  Musculoskeletal: No edema.  Psychiatric: Patient presently is alert awake oriented.  Neurologic: Moves all extremities 5 x 5.  Labs on Admission:  Basic Metabolic Panel:  Recent Labs Lab 02/07/14 0228  NA 136*  K 3.4*  CL 100  CO2 22  GLUCOSE 135*  BUN 3*  CREATININE 0.87  CALCIUM 8.6   Liver Function Tests:  Recent Labs Lab 02/07/14 0228  AST 14  ALT 19  ALKPHOS 86  BILITOT 0.2*  PROT 5.7*  ALBUMIN 3.0*   No results found for this basename: LIPASE, AMYLASE,  in the last 168 hours No results found for this basename: AMMONIA,  in the last 168 hours CBC:  Recent Labs Lab 02/07/14 0228  WBC 5.5  NEUTROABS 5.2  HGB 10.9*  HCT 32.1*  MCV 90.2  PLT 314   Cardiac Enzymes: No results found for this basename: CKTOTAL, CKMB, CKMBINDEX, TROPONINI,  in the last 168 hours  BNP (last 3 results) No results found for this basename: PROBNP,  in the last 8760 hours CBG: No results found for this basename: GLUCAP,  in the last 168 hours  Radiological Exams on Admission: Dg Chest 2 View  02/06/2014   CLINICAL DATA:  Altered mental status.  EXAM: CHEST  2 VIEW  COMPARISON:  12/09/2013  FINDINGS: Normal heart size and pulmonary vascularity. Multiple diffuse metastases throughout the lungs, similar to prior study. No developing airspace disease or consolidation. No blunting of costophrenic angles. No pneumothorax. Destructive bone lesion in  the right scapula is probably metastatic.  IMPRESSION: Diffuse bilateral pulmonary metastases. Destructive bone metastasis in the right scapula.   Electronically Signed   By: Lucienne Capers M.D.   On: 02/06/2014 23:03   Ct Head Wo Contrast  02/06/2014   CLINICAL DATA:  Headaches and confusion.  EXAM: CT HEAD WITHOUT CONTRAST  TECHNIQUE: Contiguous axial images were obtained from the base of the skull through the vertex without intravenous contrast.  COMPARISON:  CT head 01/16/2013.  MRI brain 12/26/2013.  FINDINGS: 1.3 cm diameter mass lesion in the fourth ventricle consistent with metastatic disease is enlarged since previous CT scan but appears stable since the prior MRI. Tiny additional nodular lesions demonstrated on the MRI are not appreciable on CT. Ventricles and sulci appear otherwise symmetrical. No significant ventricular dilatation. No  abnormal extra-axial fluid collections. Gray-white matter junctions are distinct. Basal cisterns are not effaced. No evidence of acute intracranial hemorrhage. Calvarium appears intact. Opacification of the right frontal sinus with mucosal thickening in the maxillary antra bilaterally. Mastoid air cells are not opacified.  IMPRESSION: Re identification of known mass in the fourth ventricle. No acute intracranial abnormalities are suspected. Chronic inflammatory changes in the sinuses.   Electronically Signed   By: Lucienne Capers M.D.   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 Acute encephalopathy - most likely related to pain-related issues. Patient is afebrile. Patient also has metastasis to the brain and further recommendations will be as per oncologist with regarding to further tests. I have placed patient on Decadron 4 mg IV every 6 hourly. Continue with pain relief medications. During her last admission  patient had required when necessary Haldol and Abilify for agitation. Presently patient is not agitated. 2. Metastatic papillary thyroid cancer - further recommendations per oncologist.  3. Chronic anemia - follow CBC. 4. Left foot ulcer - I don't see any active discharge. Wound team consult requested.    Code Status: DO NOT RESUSCITATE.  Family Communication: None.  Disposition Plan: Admit to inpatient.    Tsion Inghram N. [facility] Pager [phone].  If 7PM-7AM, please contact night-coverage [email or web address] Password [facility] 02/07/2014, 5:45 AM

## 2014-02-07 NOTE — Progress Notes (Signed)
Spoke w/Jamie, RN for pt in room 1343 informing her that per Dr Valere Dross pt may receive radiation treatment today. She states "patient is confused." Roselyn Reef questioned pt about receiving treatment today, and pt stated he wanted to have radiation. Requested Jamie medicate pt for pain prior to his treatment. She stated pt may have Dilaudid at 2:10 pm. Informed Anderson Malta, RT linac 1 of above. Pt will have treatment following his Diluadid.

## 2014-02-07 NOTE — Progress Notes (Signed)
Follow-up with pt w/new admit. CSW was also present during visit and nurse joined [place].  Pt was very positive and said God said it is not his time yet. Visit was short as staff was with him. Pls call whenever spt is needed. Ernest Haber Chaplain  02/07/14 1600  Clinical Encounter Type  Visited With Patient;Health care provider

## 2014-02-07 NOTE — Consult Note (Signed)
WOC wound consult note Reason for Consult: 2 open lesions to left foot.  One is left lateral and one is on dorsal foot just below the 4th metarsal.  Wound type: Unclear.  Patient thinks he injured his foot on something, but has a history of cellulitis in the left lower extremity.  Measurement: Left lateral ulcer 0.5 cm x 0.5 cm x 0.1cm Left dorsal foot below toe is 0.2 cm x 0.3 cm x 0.1 cm Wound bed: All are 100% beefy red, nongranulating.  Edema noted in left foot.  Palpable pedal pulse.  Drainage (amount, consistency, odor) minimal serosanguinous drainage.  Periwound:Edematous Dressing procedure/placement/frequency:Cleanse both ulcers to left dorsal foot with NS and pat gently dry.  Apply silicone bordered foam dressings to each ulcer.  Change every 3 days and PRN soilage.  Will not follow at this time.  Please re-consult if needed.  Domenic Moras RN BSN [place] Pager [phone]

## 2014-02-07 NOTE — Progress Notes (Signed)
Brought in rigid c-collar to patient. Patient found with IV pulled out and infusing onto floor; all the contents of the laundry bags removed a thrown on the floor. Mess cleaned up, patient stated it had always been like that, and that IV had fallen out. Patient refusing to wear collar at this time. Will continue to try and get patient to wear.

## 2014-02-07 NOTE — ED Notes (Signed)
Attempted to draw blood for lab by 2 staff, pt. Still uncooperative.

## 2014-02-07 NOTE — Progress Notes (Signed)
Clinical Social Work Department BRIEF PSYCHOSOCIAL ASSESSMENT 02/07/2014  Patient:  Frank Ward, Frank Ward     Account Number:  [id number]     Admit date:  02/06/2014  Clinical Social Worker:  Ulyess Blossom  Date/Time:  02/07/2014 04:35 PM  Referred by:  Physician  Date Referred:  02/07/2014 Referred for  Other - See comment   Other Referral:   Other psychosocial needs   Interview type:  Patient Other interview type:    PSYCHOSOCIAL DATA Living Status:  FAMILY Admitted from facility:   Level of care:   Primary support name:  Frank Ward/aunt/[phone] Primary support relationship to patient:  FAMILY Degree of support available:   adequate    CURRENT CONCERNS Current Concerns  Other - See comment   Other Concerns:    SOCIAL WORK ASSESSMENT / PLAN CSW received referral to complete psychosocial assessment as pt disposition is unknown.    CSW familiar with pt from previous admission. CSW met with pt at bedside. CSW re-introduced self and explained role. Pt shared that he was in [place] staying with is sister since previous admission. Pt discussed that pt sister is a good support to him. Pt unable to recall events that brought him back to the hospital. Pt shared that plans are to have radiation to assist with pain.Chaplain entered room shortly after CSW and pt reflected on his strong faith and displayed positivity stating that God told him it was not yet his time.  Pt agreeable to CSW returning to continue to provide support. CSW will continue to assess disposition planning and wanted to use this time to continue to build rapport with pt.    Pt previous admission pt was deemed to not have capacity and had agitated and aggressive behaviors. Pt calm and cooperative during visit today.    CSW to continue to follow and assist with disposition needs as appropriate.   Assessment/plan status:  Psychosocial Support/Ongoing Assessment of Needs Other assessment/ plan:    Information/referral to community resources:   ongoing assessment of needed referral to community resources    PATIENT'S/FAMILY'S RESPONSE TO PLAN OF CARE: Pt alert and oriented x 4. Pt calm and cooperative and did not display any agitation toward CSW throughout assessment. Pt hopeful radiation will alleviate some of his pain.   Frank Ward, MSW, [place] [facility] [phone]

## 2014-02-07 NOTE — ED Notes (Signed)
Attempt to call report-RN to call this RN back

## 2014-02-07 NOTE — Progress Notes (Signed)
[facility] PROGRESS NOTE  Frank Ward AST:[id number] DOB: [birth year] DOA: 02/06/2014 PCP: No PCP Per Patient  Assessment/Plan: -Acute encephalopathy; present with confusion, patient is calm, prior history of behavior problems, agitation aggressive behavior.  Unclear etiology, Could be related to metastasis diseases to brain. Oncology consulted. No evidence of infection no fever, or leukocytosis.  -UA negative, chest x ray: Diffuse bilateral pulmonary metastases. Destructive bone metastasis in the right scapula. -CT head with: Re identification of known mass in the fourth ventricle. No acute intracranial abnormalities are suspected. Chronic inflammatory changes in the sinuses. -Will check RPR, HIV.   -Metastatic papillary thyroid cancer -Brain Mass,lung metastases,  pathologic fractures at C3 and to a lesser extent C4 area, narrowing of the central canal to 9 mm at the level of C3   further recommendations per oncologist.  -Continue neck collar per prior neurosurgery recommendations.  -Pain management: will continue with MS contin and IV dilaudid.  -Continue with synthroid.  -Will repeat TSH, Free T 3 and T 4.  -Oncologist consulted, radiation oncologist consulted.  -Continue with IV decadron.   -Left foot ulcer -Wound team consult requested.  -Mood disorder/Behavior problems : Continue with Trazodone. Could use Haldol PRN for agiation. Continue with Ativan. Resume Seroquel.   Code Status: full code.  Family Communication:no family at bedside.  Disposition Plan: remain inpatient.    Consultants:  Oncologist  Radiation Oncologist  Procedures:  none  Antibiotics:  none  HPI/Subjective: He is complaining of right thigh pain. He removed IV Access. He is confuse, he is not making sense in his conversation.    Objective: Filed Vitals:   02/07/14 0605  BP: 138/79  Pulse: 105  Temp: 98.1 F (36.7 C)  Resp: 20    Intake/Output Summary (Last 24 hours) at  02/07/14 0918 Last data filed at 02/07/14 0421  Gross per 24 hour  Intake   1000 ml  Output    375 ml  Net    625 ml   Filed Weights   02/07/14 0605  Weight: 85.866 kg (189 lb 4.8 oz)    Exam:   General:  No distress.   Cardiovascular: S 1, S 2 RRR  Respiratory: CTA  Abdomen: BS present, soft, NT  Musculoskeletal: no edema.   Data Reviewed: Basic Metabolic Panel:  Recent Labs Lab 02/07/14 0228 02/07/14 0640  NA 136* 135*  K 3.4* 3.8  CL 100 99  CO2 22 23  GLUCOSE 135* 143*  BUN 3* 3*  CREATININE 0.87 0.84  CALCIUM 8.6 9.0   Liver Function Tests:  Recent Labs Lab 02/07/14 0228 02/07/14 0640  AST 14 16  ALT 19 20  ALKPHOS 86 92  BILITOT 0.2* 0.2*  PROT 5.7* 6.2  ALBUMIN 3.0* 3.2*   No results found for this basename: LIPASE, AMYLASE,  in the last 168 hours No results found for this basename: AMMONIA,  in the last 168 hours CBC:  Recent Labs Lab 02/07/14 0228 02/07/14 0640  WBC 5.5 3.8*  NEUTROABS 5.2 3.5  HGB 10.9* 11.2*  HCT 32.1* 33.6*  MCV 90.2 91.1  PLT 314 304   Cardiac Enzymes: No results found for this basename: CKTOTAL, CKMB, CKMBINDEX, TROPONINI,  in the last 168 hours BNP (last 3 results) No results found for this basename: PROBNP,  in the last 8760 hours CBG: No results found for this basename: GLUCAP,  in the last 168 hours  No results found for this or any previous visit (from the past 240 hour(s)).  Studies: Dg Chest 2 View  02/06/2014   CLINICAL DATA:  Altered mental status.  EXAM: CHEST  2 VIEW  COMPARISON:  12/09/2013  FINDINGS: Normal heart size and pulmonary vascularity. Multiple diffuse metastases throughout the lungs, similar to prior study. No developing airspace disease or consolidation. No blunting of costophrenic angles. No pneumothorax. Destructive bone lesion in the right scapula is probably metastatic.  IMPRESSION: Diffuse bilateral pulmonary metastases. Destructive bone metastasis in the right scapula.    Electronically Signed   By: Lucienne Capers M.D.   On: 02/06/2014 23:03   Ct Head Wo Contrast  02/06/2014   CLINICAL DATA:  Headaches and confusion.  EXAM: CT HEAD WITHOUT CONTRAST  TECHNIQUE: Contiguous axial images were obtained from the base of the skull through the vertex without intravenous contrast.  COMPARISON:  CT head 01/16/2013.  MRI brain 12/26/2013.  FINDINGS: 1.3 cm diameter mass lesion in the fourth ventricle consistent with metastatic disease is enlarged since previous CT scan but appears stable since the prior MRI. Tiny additional nodular lesions demonstrated on the MRI are not appreciable on CT. Ventricles and sulci appear otherwise symmetrical. No significant ventricular dilatation. No abnormal extra-axial fluid collections. Gray-white matter junctions are distinct. Basal cisterns are not effaced. No evidence of acute intracranial hemorrhage. Calvarium appears intact. Opacification of the right frontal sinus with mucosal thickening in the maxillary antra bilaterally. Mastoid air cells are not opacified.  IMPRESSION: Re identification of known mass in the fourth ventricle. No acute intracranial abnormalities are suspected. Chronic inflammatory changes in the sinuses.   Electronically Signed   By: Lucienne Capers M.D.   On: 02/06/2014 23:38    Scheduled Meds: . dexamethasone  4 mg Intravenous 4 times per day  . enoxaparin (LOVENOX) injection  40 mg Subcutaneous Q24H  . furosemide  20 mg Oral Daily  . HYDROmorphone      . levothyroxine  100 mcg Oral QAC breakfast  . morphine  60 mg Oral Q12H  . pantoprazole  40 mg Oral Daily   Continuous Infusions: . sodium chloride 50 mL/hr at 02/07/14 7902    Principal Problem:   Acute encephalopathy Active Problems:   Anemia   Metastatic cancer to brain or spinal cord   Ulcer of left lower leg    Time spent: 35 minutes./     [place], [facility] Pager [phone]. If 7PM-7AM, please contact night-coverage at  [email or web address], password [facility] 02/07/2014, 9:18 AM  LOS: 1 day

## 2014-02-07 NOTE — Progress Notes (Signed)
I was called regarding the patient's hospitalization. He was able to be placed on the schedule today and he has begun additional palliative radiation. This was setup for him previously and he was due to start this earlier but the patient did not followup in our clinic after leaving the hospital.  I have reviewed the patient's imaging which does not suggest any new worrisome changes in terms of intracranial metastatic disease. This is consistent with a brain MRI scan last month.  We will continue the patient's treatment next week and hopefully achieve some pain relief which also may facilitate his proceeding with radioactive iodine treatment.

## 2014-02-07 NOTE — ED Provider Notes (Signed)
Care assumed from Dr. Stevie Kern.  Labs resulted and are unremarkable.  Head CT shows mass but no acute changes.  Discussed with Dr. Hal Hope, who will admit.    Frank Siren III, MD 02/07/14 [phone]

## 2014-02-07 NOTE — ED Notes (Signed)
Pt anxious and agitated and ambulatory to bathroom-will re-check pulse prior to transport to floor

## 2014-02-07 NOTE — ED Notes (Signed)
Pt. Uncooperative , unable to obtain blood works.

## 2014-02-07 NOTE — Progress Notes (Signed)
During bedside reporting day shift RN stated patient was gone and had been gone "about an hour". Patient returned to unit around 1930. AC notified and spoke with pt regarding not leaving unit. Day shift RN notified on-call physician.   Patient not wearing C-collar, educated patient and asked he wear collar for his safety. Pt refused.

## 2014-02-07 NOTE — ED Notes (Signed)
Pt. Resisted blood works, uncooperative.

## 2014-02-07 NOTE — Progress Notes (Signed)
Frank Ward   DOB:07-07-1971   OZ#:[id number]   NGE#:[id number]  Subjective:  Frank Ward is a 43 y.o. 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  The episode of confusion was self resolved, and his exam was non focal. CT of the head at the Emergency Department was negative for new masses, and the known mass in the 4th ventricle was stable. Other than low back pain, chronic, he denies chest pain, shortness of breath, nausea, vomiting, dizziness or abdominal pain. No constipation. Other than healing left foot extremity cellulitis, no other infectious foci were seen by the admitting team. At the time of visit, he is asymptomatic, but conversation is tangential.   He was placed on Decadron and pain medications.  Patient has been noncompliant, and has not been able to be initiated in any treatment plans as he failed to follow up. His last no show was on 6/22 to  Radiation oncology . We were informed of the patient's admission. Please refer to the lengthy consultation note by Oncology on 5/26 for details  Scheduled Meds: . dexamethasone  4 mg Intravenous 4 times per day  . enoxaparin (LOVENOX) injection  40 mg Subcutaneous Q24H  . furosemide  20 mg Oral Daily  . HYDROmorphone      . levothyroxine  100 mcg Oral QAC breakfast  . morphine  60 mg Oral Q12H  . pantoprazole  40 mg Oral Daily   Continuous Infusions: . sodium chloride 50 mL/hr at 02/07/14 0804   PRN Meds:.acetaminophen, acetaminophen, docusate sodium, HYDROmorphone (DILAUDID) injection, ibuprofen, LORazepam, ondansetron (ZOFRAN) IV, ondansetron, traZODone  Objective:  Filed Vitals:   02/07/14 0605  BP: 138/79  Pulse: 105  Temp: 98.1 F (36.7 C)  Resp: 20    Body mass index  is 23.05 kg/(m^2).  Intake/Output Summary (Last 24 hours) at 02/07/14 0958 Last data filed at 02/07/14 0421  Gross per 24 hour  Intake   1000 ml  Output    375 ml  Net    625 ml    ECOG PERFORMANCE STATUS: 0-1  GENERAL:alert, no distress and irritable, patient does not elaborate answers. SKIN: skin color, texture, turgor are normal, no rashes. Known healing left foot small ulcer  EYES: normal, conjunctiva are pink and non-injected, sclera clear OROPHARYNX:no exudate, no erythema and lips, buccal mucosa, and tongue normal  NECK: supple, thyroid normal size, non-tender, without nodularity LYMPH:  no palpable lymphadenopathy in the cervical, axillary or inguinal LUNGS: clear to auscultation and percussion with normal breathing effort HEART: regular rate & rhythm and no murmurs and no lower extremity edema ABDOMEN:abdomen soft, non-tender and normal bowel sounds Musculoskeletal:no cyanosis of digits and no clubbing  PSYCH: alert & oriented x 3 with fluent speech NEURO: no focal motor/sensory deficits    CBG (last 3)  No results found for this basename: GLUCAP,  in the last 72 hours   Labs:   Recent Labs Lab 02/07/14 0228 02/07/14 0640  WBC 5.5 3.8*  HGB 10.9* 11.2*  HCT 32.1* 33.6*  PLT 314 304  MCV 90.2 91.1  MCH 30.6 30.4  MCHC 34.0 33.3  RDW 13.8 13.9  LYMPHSABS 0.3* 0.3*  MONOABS 0.1 0.1  EOSABS 0.0 0.0  BASOSABS 0.0 0.0     Chemistries:    Recent Labs Lab 02/07/14  0228 02/07/14 0640  NA 136* 135*  K 3.4* 3.8  CL 100 99  CO2 22 23  GLUCOSE 135* 143*  BUN 3* 3*  CREATININE 0.87 0.84  CALCIUM 8.6 9.0  AST 14 16  ALT 19 20  ALKPHOS 86 92  BILITOT 0.2* 0.2*    GFR Estimated Creatinine Clearance: 134.9 ml/min (by C-G formula based on Cr of 0.84).  Liver Function Tests:  Recent Labs Lab 02/07/14 0228 02/07/14 0640  AST 14 16  ALT 19 20  ALKPHOS 86 92  BILITOT 0.2* 0.2*  PROT 5.7* 6.2  ALBUMIN 3.0* 3.2*   No results found for this  basename: LIPASE, AMYLASE,  in the last 168 hours No results found for this basename: AMMONIA,  in the last 168 hours  Urine Studies     Component Value Date/Time   COLORURINE YELLOW 02/07/2014 0355   APPEARANCEUR CLOUDY* 02/07/2014 0355   LABSPEC 1.011 02/07/2014 0355   PHURINE 5.5 02/07/2014 [place] 02/07/2014 [place] 02/07/2014 [place] 02/07/2014 0355   KETONESUR 15* 02/07/2014 0355   PROTEINUR NEGATIVE 02/07/2014 0355   UROBILINOGEN 1.0 02/07/2014 0355   NITRITE NEGATIVE 02/07/2014 0355   LEUKOCYTESUR NEGATIVE 02/07/2014 0355       Imaging Studies:  Dg Chest 2 View  02/06/2014   CLINICAL DATA:  Altered mental status.  EXAM: CHEST  2 VIEW  COMPARISON:  12/09/2013  FINDINGS: Normal heart size and pulmonary vascularity. Multiple diffuse metastases throughout the lungs, similar to prior study. No developing airspace disease or consolidation. No blunting of costophrenic angles. No pneumothorax. Destructive bone lesion in the right scapula is probably metastatic.  IMPRESSION: Diffuse bilateral pulmonary metastases. Destructive bone metastasis in the right scapula.   Electronically Signed   By: Lucienne Capers M.D.   On: 02/06/2014 23:03   Ct Head Wo Contrast  02/06/2014   CLINICAL DATA:  Headaches and confusion.  EXAM: CT HEAD WITHOUT CONTRAST  TECHNIQUE: Contiguous axial images were obtained from the base of the skull through the vertex without intravenous contrast.  COMPARISON:  CT head 01/16/2013.  MRI brain 12/26/2013.  FINDINGS: 1.3 cm diameter mass lesion in the fourth ventricle consistent with metastatic disease is enlarged since previous CT scan but appears stable since the prior MRI. Tiny additional nodular lesions demonstrated on the MRI are not appreciable on CT. Ventricles and sulci appear otherwise symmetrical. No significant ventricular dilatation. No abnormal extra-axial fluid collections. Gray-white matter junctions are distinct. Basal  cisterns are not effaced. No evidence of acute intracranial hemorrhage. Calvarium appears intact. Opacification of the right frontal sinus with mucosal thickening in the maxillary antra bilaterally. Mastoid air cells are not opacified.  IMPRESSION: Re identification of known mass in the fourth ventricle. No acute intracranial abnormalities are suspected. Chronic inflammatory changes in the sinuses.   Electronically Signed   By: Lucienne Capers M.D.   On: 02/06/2014 23:38    Assessment/Plan: 43 y.o.  1. Metastatic thyroid cancer complicated by . Brain mets       C3, C4 Lesion      Agree with reconsult with radiation oncology     Not a surgery candidate.      CT of the head stable, without new findings     Continue decadron 4 mg IV every 6 hrs as  per primary team.        2. Acute encephalopathy  Likely secondary to narcotic meds in the setting of metastatic  brain lesion   2. Pain  This is a current issue. Patient is pain medication dependant.  Currently controlled with current regimen.  He is to continue on Decadron  4.Normocytic Anemia of chronic disease   No bleeding issues at this time.  No transfusions indicated   5. Psych: patient has intermittent confusion. Psychiatric evaluations recommend patient to continue on Abilify and Haldol. According to records, he is unable to make decisions on his own due to impaired judgement.    Other medical issues as per admitting team  Thank you for allowing [place] to participate on the care of Frank Ward.      **Disclaimer: This note was dictated with voice recognition software. Similar sounding words can inadvertently be transcribed and this note may contain transcription errors which may not have been corrected upon publication of note.** WERTMAN,SARA E, PA-C 02/07/2014  9:58 AM   ADDENDUM: Patient seen and examined.  I agree with above.  Patient is a 43 year old male with a diagnosis of metastatic papillary thyroid cancer to the lungs,  bones and spinal cord recently discharged on 6/15 after a lengthy hospital stay with multiple medical and behavioral issues centered around pain medication dependence, readmitted on 6/26 with acute encephalopathy. He has been seen by both medical oncology (Dr. Jana Hakim) and endocrinology (Dr. Dwyane Dee) on recent admission. The plan was to complete palliative XRT (Dr. Valere Dross) and consider I-131 as an outpatient due to location of avid disease. He should ollow up with Dr. Dwyane Dee on an outpatient basis to implement plan as detailed on his prior consultation report.  We have nothing to add to his oncology care at this point.  We will sign off.  Please call us with questions.  Dr. Jana Hakim is out of town until 02/18/2014.  I will be covering for him regarding this patient. Patient is agreeable with this plan.    I personally saw this patient and performed a substantive portion of this encounter with the listed APP documented above.   CHISM, DAVID, MD

## 2014-02-08 DIAGNOSIS — M79609 Pain in unspecified limb: Secondary | ICD-10-CM

## 2014-02-08 LAB — RPR

## 2014-02-08 LAB — BASIC METABOLIC PANEL
BUN: 10 mg/dL (ref 6–23)
CALCIUM: 10.4 mg/dL (ref 8.4–10.5)
CO2: 25 meq/L (ref 19–32)
CREATININE: 1.15 mg/dL (ref 0.50–1.35)
Chloride: 96 mEq/L (ref 96–112)
GFR, EST AFRICAN AMERICAN: 87 mL/min — AB (ref >90)
GFR, EST NON AFRICAN AMERICAN: 75 mL/min — AB (ref >90)
Glucose, Bld: 147 mg/dL — ABNORMAL HIGH (ref 70–99)
Potassium: 4.2 mEq/L (ref 3.7–5.3)
SODIUM: 133 meq/L — AB (ref 137–147)

## 2014-02-08 LAB — CBC
HCT: 36.5 % — ABNORMAL LOW (ref 39.0–52.0)
Hemoglobin: 12 g/dL — ABNORMAL LOW (ref 13.0–17.0)
MCH: 30.2 pg (ref 26.0–34.0)
MCHC: 32.9 g/dL (ref 30.0–36.0)
MCV: 91.9 fL (ref 78.0–100.0)
PLATELETS: 417 10*3/uL — AB (ref 150–400)
RBC: 3.97 MIL/uL — ABNORMAL LOW (ref 4.22–5.81)
RDW: 14.1 % (ref 11.5–15.5)
WBC: 7.7 10*3/uL (ref 4.0–10.5)

## 2014-02-08 LAB — TSH: TSH: 5.8 u[IU]/mL — AB (ref 0.350–4.500)

## 2014-02-08 LAB — T4, FREE: FREE T4: 1.15 ng/dL (ref 0.80–1.80)

## 2014-02-08 LAB — HIV ANTIBODY (ROUTINE TESTING W REFLEX): HIV 1&2 Ab, 4th Generation: NONREACTIVE

## 2014-02-08 LAB — T3, FREE: T3, Free: 3.4 pg/mL (ref 2.3–4.2)

## 2014-02-08 NOTE — Progress Notes (Signed)
VASCULAR LAB PRELIMINARY  PRELIMINARY  PRELIMINARY  PRELIMINARY  Right lower extremity venous Doppler completed.    Preliminary report:  There is no DVT or SVT noted in the right lower extremity.  KANADY, CANDACE, RVT 02/08/2014, 3:37 PM

## 2014-02-08 NOTE — Progress Notes (Signed)
Spoke with patient who verbalized that we can speak to his mother [name] concerning his condition.

## 2014-02-08 NOTE — Progress Notes (Signed)
[facility] PROGRESS NOTE  Frank Ward KZS:[id number] DOB: 03-23-1971 DOA: 02/06/2014 PCP: No PCP Per Patient  Assessment/Plan: -Acute encephalopathy; present with confusion, patient is calm, prior history of behavior problems, agitation aggressive behavior.  Unclear etiology, Could be related to metastasis diseases to brain. Oncology consulted. No evidence of infection no fever, or leukocytosis.  -UA negative, chest x ray: Diffuse bilateral pulmonary metastases. Destructive bone metastasis in the right scapula. -CT head with: Re identification of known mass in the fourth ventricle. No acute intracranial abnormalities are suspected. Chronic inflammatory changes in the sinuses. -RPR non reactive, HIV non reactive.   -Metastatic papillary thyroid cancer -Brain Mass,lung metastases,  pathologic fractures at C3 and to a lesser extent C4 area, narrowing of the central canal to 9 mm at the level of C3   further recommendations per oncologist.  -Continue neck collar per prior neurosurgery recommendations.  -Pain management: will continue with MS contin and IV dilaudid.  -Continue with synthroid.  -TSH at 5.8 down from 27.1 , Free T 3 and T 4 normal.  -Oncologist consulted, radiation oncologist consulted.  -Continue with IV decadron, radiation therapy.   -Left foot ulcer -Wound team consult requested.  -Mood disorder/Behavior problems : Continue with Trazodone. Could use Haldol PRN for agiation. Continue with Ativan. Resume Seroquel.  -Right Thigh pain; will check doppler.   Code Status: full code.  Family Communication:no family at bedside.  Disposition Plan: remain inpatient. SW consulted for placement disposition.    Consultants:  Oncologist  Radiation Oncologist  Procedures:  none  Antibiotics:  none  HPI/Subjective: He is complaining of right thigh pain. Appears calm, he wants to speak with Dr [name].  He doesn't feel he is better. He agree to speak with SW.     Objective: Filed Vitals:   02/08/14 0621  BP: 135/86  Pulse: 107  Temp: 97.4 F (36.3 C)  Resp: 20    Intake/Output Summary (Last 24 hours) at 02/08/14 1354 Last data filed at 02/08/14 0600  Gross per 24 hour  Intake    480 ml  Output      0 ml  Net    480 ml   Filed Weights   02/07/14 0605  Weight: 85.866 kg (189 lb 4.8 oz)    Exam:   General:  No distress.   Cardiovascular: S 1, S 2 RRR  Respiratory: CTA  Abdomen: BS present, soft, NT  Musculoskeletal: no edema. Left foot with ulcer anterior aspect feet, healing, no significant redness.   Data Reviewed: Basic Metabolic Panel:  Recent Labs Lab 02/07/14 0228 02/07/14 0640 02/08/14 0440  NA 136* 135* 133*  K 3.4* 3.8 4.2  CL 100 99 96  CO2 22 23 25   GLUCOSE 135* 143* 147*  BUN 3* 3* 10  CREATININE 0.87 0.84 1.15  CALCIUM 8.6 9.0 10.4   Liver Function Tests:  Recent Labs Lab 02/07/14 0228 02/07/14 0640  AST 14 16  ALT 19 20  ALKPHOS 86 92  BILITOT 0.2* 0.2*  PROT 5.7* 6.2  ALBUMIN 3.0* 3.2*   No results found for this basename: LIPASE, AMYLASE,  in the last 168 hours No results found for this basename: AMMONIA,  in the last 168 hours CBC:  Recent Labs Lab 02/07/14 0228 02/07/14 0640 02/08/14 0440  WBC 5.5 3.8* 7.7  NEUTROABS 5.2 3.5  --   HGB 10.9* 11.2* 12.0*  HCT 32.1* 33.6* 36.5*  MCV 90.2 91.1 91.9  PLT 314 304 417*   Cardiac Enzymes: No  results found for this basename: CKTOTAL, CKMB, CKMBINDEX, TROPONINI,  in the last 168 hours BNP (last 3 results) No results found for this basename: PROBNP,  in the last 8760 hours CBG: No results found for this basename: GLUCAP,  in the last 168 hours  No results found for this or any previous visit (from the past 240 hour(s)).   Studies: Dg Chest 2 View  02/06/2014   CLINICAL DATA:  Altered mental status.  EXAM: CHEST  2 VIEW  COMPARISON:  12/09/2013  FINDINGS: Normal heart size and pulmonary vascularity. Multiple diffuse  metastases throughout the lungs, similar to prior study. No developing airspace disease or consolidation. No blunting of costophrenic angles. No pneumothorax. Destructive bone lesion in the right scapula is probably metastatic.  IMPRESSION: Diffuse bilateral pulmonary metastases. Destructive bone metastasis in the right scapula.   Electronically Signed   By: Lucienne Capers M.D.   On: 02/06/2014 23:03   Ct Head Wo Contrast  02/06/2014   CLINICAL DATA:  Headaches and confusion.  EXAM: CT HEAD WITHOUT CONTRAST  TECHNIQUE: Contiguous axial images were obtained from the base of the skull through the vertex without intravenous contrast.  COMPARISON:  CT head 01/16/2013.  MRI brain 12/26/2013.  FINDINGS: 1.3 cm diameter mass lesion in the fourth ventricle consistent with metastatic disease is enlarged since previous CT scan but appears stable since the prior MRI. Tiny additional nodular lesions demonstrated on the MRI are not appreciable on CT. Ventricles and sulci appear otherwise symmetrical. No significant ventricular dilatation. No abnormal extra-axial fluid collections. Gray-white matter junctions are distinct. Basal cisterns are not effaced. No evidence of acute intracranial hemorrhage. Calvarium appears intact. Opacification of the right frontal sinus with mucosal thickening in the maxillary antra bilaterally. Mastoid air cells are not opacified.  IMPRESSION: Re identification of known mass in the fourth ventricle. No acute intracranial abnormalities are suspected. Chronic inflammatory changes in the sinuses.   Electronically Signed   By: Lucienne Capers M.D.   On: 02/06/2014 23:38    Scheduled Meds: . dexamethasone  4 mg Intravenous 4 times per day  . enoxaparin (LOVENOX) injection  40 mg Subcutaneous Q24H  . furosemide  20 mg Oral Daily  . levothyroxine  100 mcg Oral QAC breakfast  . morphine  60 mg Oral Q12H  . pantoprazole  40 mg Oral Daily  . QUEtiapine  100 mg Oral Daily   Continuous  Infusions:    Principal Problem:   Acute encephalopathy Active Problems:   Anemia   Metastatic cancer to brain or spinal cord   Ulcer of left lower leg    Time spent: 30 minutes./     [place], [place] [facility] Pager [phone]. If 7PM-7AM, please contact night-coverage at [email or web address], password [facility] 02/08/2014, 1:54 PM  LOS: 2 days

## 2014-02-09 MED ORDER — HYDROMORPHONE HCL PF 1 MG/ML IJ SOLN
1.0000 mg | Freq: Once | INTRAMUSCULAR | Status: AC
  Administered 2014-02-09: 1 mg via INTRAVENOUS
  Filled 2014-02-09: qty 1

## 2014-02-09 NOTE — Progress Notes (Signed)
[facility] PROGRESS NOTE  Frank Ward TKW:[id number] DOB: 11-17-1970 DOA: 02/06/2014 PCP: No PCP Per Patient  Assessment/Plan: -Acute encephalopathy; present with confusion, patient is calm, prior history of behavior problems, agitation aggressive behavior.  Unclear etiology, Could be related to metastasis diseases to brain. Oncology consulted. No evidence of infection no fever, or leukocytosis.  -UA negative, chest x ray: Diffuse bilateral pulmonary metastases. Destructive bone metastasis in the right scapula. -CT head with: Re identification of known mass in the fourth ventricle. No acute intracranial abnormalities are suspected. Chronic inflammatory changes in the sinuses. -RPR non reactive, HIV non reactive.   -Metastatic papillary thyroid cancer -Brain Mass,lung metastases,  pathologic fractures at C3 and to a lesser extent C4 area, narrowing of the central canal to 9 mm at the level of C3   further recommendations per oncologist.  -Continue neck collar per prior neurosurgery recommendations.  -Pain management: will continue with MS contin and IV dilaudid.  -Continue with synthroid.  -TSH at 5.8 down from 27.1 , Free T 3 and T 4 normal.  -Oncologist consulted, radiation oncologist consulted.  -Continue with IV decadron, radiation therapy.   -Left foot ulcer -Wound team consult requested.   -Mood disorder/Behavior problems : Continue with Trazodone. Could use Haldol PRN for agiation. Continue with Ativan. Resume Seroquel.  -Right Thigh pain; will check doppler.   Code Status: full code.  Family Communication:no family at bedside.  Disposition Plan: remain inpatient. SW consulted for placement disposition.    Consultants:  Oncologist  Radiation Oncologist  Procedures:  none  Antibiotics:  none  HPI/Subjective: Still complaining of thigh pain. He is worry about his treatment. We always discharge him in the middle of there treatments. He doesn't know how  he got here.    Objective: Filed Vitals:   02/09/14 1455  BP: 127/69  Pulse: 105  Temp: 98.9 F (37.2 C)  Resp: 16   No intake or output data in the 24 hours ending 02/09/14 1610 Filed Weights   02/07/14 0605  Weight: 85.866 kg (189 lb 4.8 oz)    Exam:   General:  No distress.   Cardiovascular: S 1, S 2 RRR  Respiratory: CTA  Abdomen: BS present, soft, NT  Musculoskeletal: no edema. Left foot with ulcer anterior aspect feet, healing, no significant redness.   Data Reviewed: Basic Metabolic Panel:  Recent Labs Lab 02/07/14 0228 02/07/14 0640 02/08/14 0440  NA 136* 135* 133*  K 3.4* 3.8 4.2  CL 100 99 96  CO2 22 23 25   GLUCOSE 135* 143* 147*  BUN 3* 3* 10  CREATININE 0.87 0.84 1.15  CALCIUM 8.6 9.0 10.4   Liver Function Tests:  Recent Labs Lab 02/07/14 0228 02/07/14 0640  AST 14 16  ALT 19 20  ALKPHOS 86 92  BILITOT 0.2* 0.2*  PROT 5.7* 6.2  ALBUMIN 3.0* 3.2*   No results found for this basename: LIPASE, AMYLASE,  in the last 168 hours No results found for this basename: AMMONIA,  in the last 168 hours CBC:  Recent Labs Lab 02/07/14 0228 02/07/14 0640 02/08/14 0440  WBC 5.5 3.8* 7.7  NEUTROABS 5.2 3.5  --   HGB 10.9* 11.2* 12.0*  HCT 32.1* 33.6* 36.5*  MCV 90.2 91.1 91.9  PLT 314 304 417*   Cardiac Enzymes: No results found for this basename: CKTOTAL, CKMB, CKMBINDEX, TROPONINI,  in the last 168 hours BNP (last 3 results) No results found for this basename: PROBNP,  in the last 8760 hours CBG:  No results found for this basename: GLUCAP,  in the last 168 hours  No results found for this or any previous visit (from the past 240 hour(s)).   Studies: No results found.  Scheduled Meds: . dexamethasone  4 mg Intravenous 4 times per day  . enoxaparin (LOVENOX) injection  40 mg Subcutaneous Q24H  . furosemide  20 mg Oral Daily  . levothyroxine  100 mcg Oral QAC breakfast  . morphine  60 mg Oral Q12H  . pantoprazole  40 mg Oral Daily   . QUEtiapine  100 mg Oral Daily   Continuous Infusions:    Principal Problem:   Acute encephalopathy Active Problems:   Anemia   Metastatic cancer to brain or spinal cord   Ulcer of left lower leg    Time spent: 30 minutes./     [place], [place] [facility] Pager [phone]. If 7PM-7AM, please contact night-coverage at [email or web address], password [facility] 02/09/2014, 4:10 PM  LOS: 3 days

## 2014-02-09 NOTE — Progress Notes (Signed)
  Radiation Oncology         ([phone]) [phone] ________________________________  Name: Frank Ward MRN: [id number]  Date: 02/07/2014  DOB: 1971/06/08  Simulation Verification Note   NARRATIVE: The patient was brought to the treatment unit and placed in the planned treatment position. The clinical setup was verified. Then port films were obtained and uploaded to the radiation oncology medical record software.  The treatment beams were carefully compared against the planned radiation fields. The position, location, and shape of the radiation fields was reviewed. The targeted volume of tissue appears to be appropriately covered by the radiation beams. Based on my personal review, I approved the simulation verification. The patient's treatment will proceed as planned.  ________________________________   Jodelle Gross, MD, PhD

## 2014-02-10 ENCOUNTER — Ambulatory Visit
Admit: 2014-02-10 | Discharge: 2014-02-10 | Disposition: A | Discharge status: Home / Self Care | Payer: Medicaid Other | Attending: Radiation Oncology | Admitting: Radiation Oncology

## 2014-02-10 ENCOUNTER — Ambulatory Visit
Admission: RE | Admit: 2014-02-10 | Discharge: 2014-02-10 | Disposition: A | Discharge status: Home / Self Care | Payer: Medicaid Other | Source: Ambulatory Visit | Attending: Radiation Oncology | Admitting: Radiation Oncology

## 2014-02-10 DIAGNOSIS — C7951 Secondary malignant neoplasm of bone: Principal | ICD-10-CM

## 2014-02-10 DIAGNOSIS — G8929 Other chronic pain: Secondary | ICD-10-CM

## 2014-02-10 DIAGNOSIS — C73 Malignant neoplasm of thyroid gland: Secondary | ICD-10-CM

## 2014-02-10 LAB — BASIC METABOLIC PANEL
BUN: 11 mg/dL (ref 6–23)
CO2: 24 mEq/L (ref 19–32)
CREATININE: 0.91 mg/dL (ref 0.50–1.35)
Calcium: 8.9 mg/dL (ref 8.4–10.5)
Chloride: 102 mEq/L (ref 96–112)
GFR calc Af Amer: >90 mL/min (ref >90)
GLUCOSE: 236 mg/dL — AB (ref 70–99)
POTASSIUM: 4 meq/L (ref 3.7–5.3)
Sodium: 139 mEq/L (ref 137–147)

## 2014-02-10 MED ORDER — HYDROMORPHONE HCL 4 MG PO TABS
4.0000 mg | ORAL_TABLET | ORAL | Status: DC | PRN
  Administered 2014-02-10 – 2014-02-13 (×4): 4 mg via ORAL
  Filled 2014-02-10 (×6): qty 1

## 2014-02-10 MED ORDER — DEXAMETHASONE 4 MG PO TABS
4.0000 mg | ORAL_TABLET | Freq: Four times a day (QID) | ORAL | Status: DC
  Administered 2014-02-10 – 2014-02-19 (×23): 4 mg via ORAL
  Filled 2014-02-10 (×51): qty 1

## 2014-02-10 MED ORDER — HYDROMORPHONE HCL PF 2 MG/ML IJ SOLN
2.0000 mg | INTRAMUSCULAR | Status: AC | PRN
  Administered 2014-02-10 – 2014-02-11 (×2): 2 mg via INTRAVENOUS
  Filled 2014-02-10: qty 1

## 2014-02-10 MED ORDER — HYDROMORPHONE HCL PF 2 MG/ML IJ SOLN
2.0000 mg | INTRAMUSCULAR | Status: DC | PRN
  Administered 2014-02-10 (×2): 2 mg via INTRAVENOUS
  Filled 2014-02-10 (×3): qty 1

## 2014-02-10 NOTE — Care Management Note (Signed)
Page 1 of 2   02/20/2014     10:23:56 AM CARE MANAGEMENT NOTE 02/20/2014  Patient:  Frank Ward, Frank Ward   Account Number:  [id number]  Date Initiated:  02/07/2014  Documentation initiated by:  Sunday Spillers  Subjective/Objective Assessment:   43 yo male admitted with encephalopathy and pain issues. PTA lived at home with mother.     Action/Plan:   Home when stable   Anticipated DC Date:  02/13/2014   Anticipated DC Plan:  HOME/SELF CARE  In-house referral  Clinical Social Worker      DC Planning Services  CM consult      Choice offered to / List presented to:             Status of service:  Completed, signed off Medicare Important Message given?  NO (If response is "NO", the following Medicare IM given date fields will be blank) Date Medicare IM given:   Medicare IM given by:   Date Additional Medicare IM given:   Additional Medicare IM given by:    Discharge Disposition:  HOME/SELF CARE  Per UR Regulation:  Reviewed for med. necessity/level of care/duration of stay  If discussed at [place] of Stay Meetings, dates discussed:    Comments:  02/20/14 10:15 CM and CSW met with pt in room to discuss post discharge plans.  Pt states he will not be with family but will be staying with a friend.  CSW reminded pt to keep his appts and Medicaid Transportation wil lbe available for rides to and from appts.  CM told pt the MEDICAID TRANSPORTATION number is on the back of his dischare instructions and  needs to be called to arrange ride at least 48 hours prior to appt.  Pt unwilling to give name of friend he will be staying with and states he does not need a ride bc his brother will pick him up at discharge.  CSW offered alternative transportation if breother is unable to pick up patient from hospital.  No other CM needs were communicated.  Mariane Masters, BSN, [place] [phone].  02/13/14 14:30 CM reviewed.  Pt's sister did not show to pick up pt (or did not let anyone know she was here).  No other family member has offered to take pt home.   Pt has 5 more radiation treatments to complete planned course of treatment.  At this point in time, there has been no successful attempt to have pt either placed in a SNF or successful placement at home where he is compliant with treatment. Pt remains inpatient.   CM will continue to monitor for disposition needs.  Mariane Masters, BSN, [place] [phone].  02/11/14 12:45 Discharge postponed until tomorrow by MD. Baker Janus made aware and plans to come to the hospital tomorrow to pick pt up and speak with discharging MD about pt.  No other CM needs were communicated.  Mariane Masters, BSN, [place] [phone].  02/11/14 11:05 CM spoke with Marita Snellen (sister [phone]) who requests he not be discharged until this afternoon. Baker Janus states she is coming from [place] and will not be able to pick him up from the hospital; CM told Baker Janus  CSW arranging taxi voucher for 2:00pm today. Baker Janus states this is a good time.  CM explained a schedule of pt's appts. will be stapled to discharge intructions and the Medicaid Transportation number will be on the discharge instructions.  Baker Janus also reminded a CSW is in the [facility] and can be called as a Theatre manager. Address  of pt verified with Baker Janus for [employer].   Baker Janus verbalized importance of pt keeping appts. and verbalized understanding of making transportation arrangements through [place] transportation [phone] as soon as she is able and to cancel if appt is postponed or cancelled.  No other CM needs were communicated. Mariane Masters, BSN, [place] [phone].   02-10-14 Sunday Spillers RN CM 1200 Spoke with patient at bedside. He states he is working on section 8 housing and has expedited his application. This was in process with the last admission as well. He rambled somewhat with his thoughts but appeared more clear and cooperative than previous encounters on last hospital stay. He states he is reluctant to accept help form family  and is tired of everyone wanting to "push him out". He hopes to stay in the hospital to complete his radiation treatment. He consented to my calling either his sister or aunt. Contacted Kippy Gohman (sister) at [phone]. Discussed d/c plans with her, she states patient will return home with his mother and she will be there to assist with his care. She will work with [place] on transportation and keeping patient compliant with plan of care. Discussed assistance with transportation available for medical treatments but let Baker Janus know patient would have to be compliant with them. Sister is very concerned and seems to be willing to help as needed. Informed attending of above conversation, patient not ready for d/c at this time.

## 2014-02-10 NOTE — Progress Notes (Signed)
[facility] PROGRESS NOTE  Frank Ward NFA:[id number] DOB: 06/05/1971 DOA: 02/06/2014 PCP: No PCP Per Patient  Assessment/Plan: -Acute encephalopathy; present with confusion, patient is calm, prior history of behavior problems, agitation aggressive behavior.  Unclear etiology, Could be related to metastasis diseases to brain. Oncology consulted. No evidence of infection no fever, or leukocytosis.  -UA negative, chest x ray: Diffuse bilateral pulmonary metastases. Destructive bone metastasis in the right scapula. -CT head with: Re identification of known mass in the fourth ventricle. No acute intracranial abnormalities are suspected. Chronic inflammatory changes in the sinuses. -RPR non reactive, HIV non reactive.   -Metastatic papillary thyroid cancer -Brain Mass,lung metastases,  pathologic fractures at C3 and to a lesser extent C4 area, narrowing of the central canal to 9 mm at the level of C3   further recommendations per oncologist.  -Continue neck collar per prior neurosurgery recommendations.  -Pain management: will continue with MS contin and IV dilaudid.  -Continue with synthroid.  -TSH at 5.8 down from 27.1 , Free T 3 and T 4 normal.  -Oncologist consulted, radiation oncologist consulted.  -Will change  IV decadron to oral, Continue with radiation therapy to thoracic spine and sacrum. .  -Will start oral dilaudid and change IV frequency of IV pain medications.   -Left foot ulcer -Wound team consult requested. No evidence of infection.   -Mood disorder/Behavior problems : Continue with Trazodone. Could use Haldol PRN for agiation. Continue with Ativan. Resume Seroquel. He is tolerating Seroquel. His behavior has improved. \  -Right Thigh pain;  Doppler negative, denies pain today. He is not complaining of pain.   Code Status: full code.  Family Communication:no family at bedside.  Disposition Plan: remain inpatient. Plan to be discharge with his mother and sister  will help with appointment and medications.    Consultants:  Oncologist  Radiation Oncologist  Procedures:  Radiation treatment.   Antibiotics:  none  HPI/Subjective: He is calm, he wants to continue with radiation. I have explain to him that he will need to continue to follow up outpatient. His sister will help with appointment.    Objective: Filed Vitals:   02/10/14 0550  BP: 134/76  Pulse: 97  Temp: 98.5 F (36.9 C)  Resp: 28   No intake or output data in the 24 hours ending 02/10/14 1422 Filed Weights   02/07/14 0605  Weight: 85.866 kg (189 lb 4.8 oz)    Exam:   General:  No distress.   Cardiovascular: S 1, S 2 RRR  Respiratory: CTA  Abdomen: BS present, soft, NT  Musculoskeletal: no edema. Left foot with ulcer anterior aspect feet, healing, no significant redness.   Data Reviewed: Basic Metabolic Panel:  Recent Labs Lab 02/07/14 0228 02/07/14 0640 02/08/14 0440 02/10/14 0500  NA 136* 135* 133* 139  K 3.4* 3.8 4.2 4.0  CL 100 99 96 102  CO2 22 23 25 24   GLUCOSE 135* 143* 147* 236*  BUN 3* 3* 10 11  CREATININE 0.87 0.84 1.15 0.91  CALCIUM 8.6 9.0 10.4 8.9   Liver Function Tests:  Recent Labs Lab 02/07/14 0228 02/07/14 0640  AST 14 16  ALT 19 20  ALKPHOS 86 92  BILITOT 0.2* 0.2*  PROT 5.7* 6.2  ALBUMIN 3.0* 3.2*   No results found for this basename: LIPASE, AMYLASE,  in the last 168 hours No results found for this basename: AMMONIA,  in the last 168 hours CBC:  Recent Labs Lab 02/07/14 0228 02/07/14 0640 02/08/14 0440  WBC 5.5 3.8* 7.7  NEUTROABS 5.2 3.5  --   HGB 10.9* 11.2* 12.0*  HCT 32.1* 33.6* 36.5*  MCV 90.2 91.1 91.9  PLT 314 304 417*   Cardiac Enzymes: No results found for this basename: CKTOTAL, CKMB, CKMBINDEX, TROPONINI,  in the last 168 hours BNP (last 3 results) No results found for this basename: PROBNP,  in the last 8760 hours CBG: No results found for this basename: GLUCAP,  in the last 168  hours  No results found for this or any previous visit (from the past 240 hour(s)).   Studies: No results found.  Scheduled Meds: . dexamethasone  4 mg Oral 4 times per day  . enoxaparin (LOVENOX) injection  40 mg Subcutaneous Q24H  . furosemide  20 mg Oral Daily  . levothyroxine  100 mcg Oral QAC breakfast  . morphine  60 mg Oral Q12H  . pantoprazole  40 mg Oral Daily  . 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 If 7PM-7AM, please contact night-coverage at [email or web address], password [email or web address] 02/10/2014, 2:22 PM  LOS: 4 days

## 2014-02-10 NOTE — Progress Notes (Signed)
Patient became very agitated when he learned his iv dilaudid frequency had been decreased to every 4 hours. He was swearing, yelling, and pacing around the room. Hospitalist on call paged and made aware, and new order to increase the frequency through the night, and have patient speak with his physician in the am. Patient was made aware of the new order, and appears to understand that he needs to speak with his MD in the am.

## 2014-02-10 NOTE — Progress Notes (Signed)
Patient seen in the back [facility] #1 by Dr.Squire, not sent to nursing  For assessment as he is inpatient, 3:19 PM

## 2014-02-10 NOTE — Progress Notes (Signed)
   Weekly Management Note:  Inpatient Current Dose:  6 Gy  Projected Dose: 30Gy  RT to Sacrum and T12-L2  Narrative:  The patient presents for routine under treatment assessment.  CBCT/MVCT images/Port film x-rays were reviewed.  The chart was checked. No acute complaints thus far.   Physical Findings:  height is 6\' 4"  (1.93 m) and weight is 189 lb 4.8 oz (85.866 kg). His oral temperature is 98.5 F (36.9 C). His blood pressure is 134/76 and his pulse is 97. His respiration is 28 and oxygen saturation is 97%.  NAD, ambulatory  Impression:  The patient is tolerating radiotherapy.  Plan:  Continue radiotherapy as planned.   ________________________________   Eppie Gibson, M.D.

## 2014-02-10 NOTE — Progress Notes (Signed)
Patient has periods of disorientation,got out from room and had packed all his personal belongings. When asked he said "somebody told me that I will be transferred to the fourth floor" and he does not know his room number. Reoriented patient with the environment. Offered to take his stuff back to room and agreed to it. Also assisted him back to room. Will continue to monitor patient.- Sandie Ano RN

## 2014-02-10 NOTE — Progress Notes (Signed)
Spoke w/Donna, RN for pt re: radiation treatment today at 2 pm. Requested she medicate pt just prior to being brought to [place] for treatment in order for patient to better tolerate his treatment. Butch Penny verbalized understanding, agreement.

## 2014-02-11 ENCOUNTER — Ambulatory Visit: Payer: Medicaid Other

## 2014-02-11 ENCOUNTER — Ambulatory Visit
Admission: RE | Admit: 2014-02-11 | Discharge: 2014-02-11 | Disposition: A | Discharge status: Home / Self Care | Payer: Medicaid Other | Source: Ambulatory Visit | Attending: Radiation Oncology | Admitting: Radiation Oncology

## 2014-02-11 DIAGNOSIS — R609 Edema, unspecified: Secondary | ICD-10-CM

## 2014-02-11 MED ORDER — HYDROMORPHONE HCL PF 2 MG/ML IJ SOLN
2.0000 mg | INTRAMUSCULAR | Status: DC | PRN
  Filled 2014-02-11: qty 1

## 2014-02-11 MED ORDER — HYDROMORPHONE HCL PF 2 MG/ML IJ SOLN
INTRAMUSCULAR | Status: AC
  Administered 2014-02-11: 2 mg via INTRAVENOUS
  Filled 2014-02-11: qty 1

## 2014-02-11 MED ORDER — HYDROMORPHONE HCL PF 2 MG/ML IJ SOLN
2.0000 mg | INTRAMUSCULAR | Status: DC | PRN
  Administered 2014-02-11 – 2014-02-22 (×95): 2 mg via INTRAVENOUS
  Filled 2014-02-11 (×99): qty 1

## 2014-02-11 NOTE — Progress Notes (Signed)
[facility] PROGRESS NOTE  Frank Ward ZOX:[id number] DOB: 02-Oct-1970 DOA: 02/06/2014 PCP: No PCP Per Patient  Assessment/Plan: 43 year old with . male with history of metastatic papillary thyroid cancer to the spine and brain, lungs who was recently discharged from the hospital 10 days ago after being treated for pain related behavioral issues who was transferred from another hospital in [place] as patient was found to be confused and a question of drug overdose.   Initially in the ER at [facility] long hospital patient was found to be confused and CT head did not show anything acute except for the known metastasis. Patient was nonfocal on exam. ED physician had discussed with on-call oncologist and at this time they have advised to admit patient place him on Decadron and pain relief medications and consult patient's radiation oncologist in a.m.  During last admission patient was dean to lack capacity for medical decision. Last time he didn't follow up for outpatient radiation treatments. Per last discharge summary patient behaviors issues were centered around his pain medications. Please read last discharge summary for  further detail.    During this admission patient was started on radiation treatment thoracic and sacrum area. He was started on Decadron. He has been receiving IV dilaudid for pain management. On 6-29 when his IV pain medication was change from IV every 2 hour to every 4 hour and oral dilaudid was ordered, patient became very agitated and yelling in the room. I saw patient today, he is complaining of  pain on his back. He wants to continue with radiation treatments in the hospital. I explain to him that at some point he will need to continue with radiation treatments as out patient. 2 Aunts were in the room at time of my rounds today. They expressed some concern about patient going home with sister.  I have ask CM, SW to help with safety discharge plans. His sister is  planning to come to hospital on 7-1. I have consulted Psych for further evaluation and help with medications. We might need to consult palliative team again.   -Metastatic papillary thyroid cancer -Brain Mass,lung metastases,  pathologic fractures at C3 and to a lesser extent C4 area, narrowing of the central canal to 9 mm at the level of C3  -Continue neck collar per prior neurosurgery recommendations.  -Pain management: will continue with MS contin and IV dilaudid.  -Continue with synthroid.  -TSH at 5.8 down from 27.1 , Free T 3 and T 4 normal.  -Oncologist consulted, radiation oncologist consulted. Oncology recommend to continue with radiation treatments and patient to follow up with Dr Dwyane Dee endocrinologist.  -Continue with oral decadron.  -Continue with radiation therapy to thoracic spine and sacrum. . 3/10 treatments planned.  -continue with  oral dilaudid. IV dilaudid PRN. Patient still complaining of severe pain, requiring IV.   -Acute encephalopathy; present with confusion, patient is calm, prior history of behavior problems, agitation aggressive behavior center aroun pain management.  Unclear etiology, Could be related to metastasis diseases to brain. Oncology consulted. No evidence of infection no fever, or leukocytosis.  -UA negative, chest x ray: Diffuse bilateral pulmonary metastases. Destructive bone metastasis in the right scapula. -CT head with: Re identification of known mass in the fourth ventricle. No acute intracranial abnormalities are suspected. Chronic inflammatory changes in the sinuses. -RPR non reactive, HIV non reactive.    -Left foot ulcer -Wound team consult requested. No evidence of infection.   -Mood disorder/Behavior problems : Continue with Trazodone.  Could use Haldol PRN for agiation. Continue with Ativan. Resume Seroquel. He is tolerating Seroquel. He got agitated when his pain medication regimen IV is change, also he get upset and agitated when we discussed  about outpatient radiation treatments. Patient with tangential conversation and confusion at times. Psych consulted 6-30.   -Right Thigh pain;  Doppler negative.   Code Status: DNR Family Communication: discussed with 2 aunts at bedside.  Disposition Plan: remain inpatient. Working on Engineer, materials discharge. CM/SW helping arrange safety discharge.     Consultants:  Oncologist  Radiation Oncologist  Procedures:  Radiation treatment.   Antibiotics:  none  HPI/Subjective: He is complaining of back pain, cervical spine pain. He wants to stay  in the hospital to received all the ration treatments. He doesn't have any problem with swallowing. He gets upset when we mention that he needs to have further treatments as outpatients. He is worry about transportation. He said that we don't want to controlled his pain, his medications was change.   He calm down when we let him know that discharge will be delay.    Objective: Filed Vitals:   02/11/14 0700  BP: 136/86  Pulse: 90  Temp: 98.7 F (37.1 C)  Resp: 20    Intake/Output Summary (Last 24 hours) at 02/11/14 1442 Last data filed at 02/11/14 0900  Gross per 24 hour  Intake    760 ml  Output      0 ml  Net    760 ml   Filed Weights   02/07/14 0605  Weight: 85.866 kg (189 lb 4.8 oz)    Exam:   General:  No distress.   Cardiovascular: S 1, S 2 RRR  Respiratory: CTA  Abdomen: BS present, soft, NT  Musculoskeletal: no edema. Left foot with ulcer anterior aspect feet, healing, no significant redness.   Data Reviewed: Basic Metabolic Panel:  Recent Labs Lab 02/07/14 0228 02/07/14 0640 02/08/14 0440 02/10/14 0500  NA 136* 135* 133* 139  K 3.4* 3.8 4.2 4.0  CL 100 99 96 102  CO2 22 23 25 24   GLUCOSE 135* 143* 147* 236*  BUN 3* 3* 10 11  CREATININE 0.87 0.84 1.15 0.91  CALCIUM 8.6 9.0 10.4 8.9   Liver Function Tests:  Recent Labs Lab 02/07/14 0228 02/07/14 0640  AST 14 16  ALT 19 20  ALKPHOS 86 92   BILITOT 0.2* 0.2*  PROT 5.7* 6.2  ALBUMIN 3.0* 3.2*   No results found for this basename: LIPASE, AMYLASE,  in the last 168 hours No results found for this basename: AMMONIA,  in the last 168 hours CBC:  Recent Labs Lab 02/07/14 0228 02/07/14 0640 02/08/14 0440  WBC 5.5 3.8* 7.7  NEUTROABS 5.2 3.5  --   HGB 10.9* 11.2* 12.0*  HCT 32.1* 33.6* 36.5*  MCV 90.2 91.1 91.9  PLT 314 304 417*   Cardiac Enzymes: No results found for this basename: CKTOTAL, CKMB, CKMBINDEX, TROPONINI,  in the last 168 hours BNP (last 3 results) No results found for this basename: PROBNP,  in the last 8760 hours CBG: No results found for this basename: GLUCAP,  in the last 168 hours  No results found for this or any previous visit (from the past 240 hour(s)).   Studies: No results found.  Scheduled Meds: . dexamethasone  4 mg Oral 4 times per day  . enoxaparin (LOVENOX) injection  40 mg Subcutaneous Q24H  . furosemide  20 mg Oral Daily  . levothyroxine  100  mcg Oral QAC breakfast  . morphine  60 mg Oral Q12H  . pantoprazole  40 mg Oral Daily  . 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 If 7PM-7AM, please contact night-coverage at [email or web address], password [facility] 02/11/2014, 2:42 PM  LOS: 5 days

## 2014-02-11 NOTE — Progress Notes (Signed)
CSW continuing to follow.  Pt discharge plan has been for pt to discharge to pt mothers home and pt sister, Baker Janus planned to come from [place] to assist in pt care at home. RNCM has been communicating with pt sister, Baker Janus regarding these arrangements.   Per MD, pt aunts were at bedside and expressed concern about discharge, but did not want to be involved any further with planing.   Barrier to discharge continues to be pt pain management as pt requiring pain medication every 2 hours. CSW contacted [place], [place] to discuss. Per [place], recommendation was to contact Dr. Evangeline Gula department medica director to determine if it is feasible/appropriate to seek SNF placement for pt for IV pain medication as pt has been requiring pain medication every 2 hours. CSW spoke with  Dr. Evangeline Gula department who stated that if pt requiring pain medication every 2 hours then pt would be unable to be placed at any facility at this time and medical team would need to assess if there is a basis for patient pain medication that often or if pt is displaying drug seeking behavior? CSW discussed with RNCM and plan is to discuss with attending MD to further assess pt pain management.   CSW to continue to follow to provide support and assist with pt disposition as appropriate.  Alison Murray, MSW, [place] Work [phone]

## 2014-02-11 NOTE — Progress Notes (Signed)
Pt apologetically declined visit today; he said he was in a lot a pain today and it was not a good day to talk. He asked me to come back tomorrow. Pt was up and said he was about to go to restroom. Pls pg if spt is needed. [place]  02/11/14 1100  Clinical Encounter Type  Visited With Patient

## 2014-02-11 NOTE — Progress Notes (Signed)
[facility] Therapy Treatment Record Phone [phone]   Radiation Therapy was administered to Frank Ward on: 02/11/2014  2:08 PM and was treatment #3 out of a planned course of 10 treatments.

## 2014-02-12 ENCOUNTER — Ambulatory Visit: Payer: Medicaid Other

## 2014-02-12 ENCOUNTER — Ambulatory Visit
Admission: RE | Admit: 2014-02-12 | Discharge: 2014-02-12 | Disposition: A | Discharge status: Home / Self Care | Payer: Medicaid Other | Source: Ambulatory Visit | Attending: Radiation Oncology | Admitting: Radiation Oncology

## 2014-02-12 DIAGNOSIS — E079 Disorder of thyroid, unspecified: Secondary | ICD-10-CM

## 2014-02-12 DIAGNOSIS — IMO0002 Reserved for concepts with insufficient information to code with codable children: Secondary | ICD-10-CM

## 2014-02-12 LAB — BASIC METABOLIC PANEL
BUN: 13 mg/dL (ref 6–23)
CO2: 25 mEq/L (ref 19–32)
CREATININE: 0.76 mg/dL (ref 0.50–1.35)
Calcium: 8.8 mg/dL (ref 8.4–10.5)
Chloride: 101 mEq/L (ref 96–112)
GFR calc Af Amer: >90 mL/min (ref >90)
GFR calc non Af Amer: >90 mL/min (ref >90)
Glucose, Bld: 211 mg/dL — ABNORMAL HIGH (ref 70–99)
Potassium: 4 mEq/L (ref 3.7–5.3)
Sodium: 138 mEq/L (ref 137–147)

## 2014-02-12 LAB — GLUCOSE, CAPILLARY
GLUCOSE-CAPILLARY: 240 mg/dL — AB (ref 70–99)
Glucose-Capillary: 317 mg/dL — ABNORMAL HIGH (ref 70–99)

## 2014-02-12 LAB — HEPATIC FUNCTION PANEL
ALT: 15 U/L (ref 0–53)
AST: 13 U/L (ref 0–37)
Albumin: 2.8 g/dL — ABNORMAL LOW (ref 3.5–5.2)
Alkaline Phosphatase: 89 U/L (ref 39–117)
Bilirubin, Direct: <0.2 mg/dL (ref 0.0–0.3)
Total Protein: 5.2 g/dL — ABNORMAL LOW (ref 6.0–8.3)

## 2014-02-12 LAB — AMMONIA: Ammonia: 32 umol/L (ref 11–60)

## 2014-02-12 MED ORDER — INSULIN ASPART 100 UNIT/ML ~~LOC~~ SOLN
0.0000 [IU] | Freq: Every day | SUBCUTANEOUS | Status: DC
  Administered 2014-02-12: 2 [IU] via SUBCUTANEOUS

## 2014-02-12 MED ORDER — INSULIN ASPART 100 UNIT/ML ~~LOC~~ SOLN
0.0000 [IU] | Freq: Three times a day (TID) | SUBCUTANEOUS | Status: DC
  Administered 2014-02-12: 11 [IU] via SUBCUTANEOUS
  Administered 2014-02-13: 5 [IU] via SUBCUTANEOUS
  Administered 2014-02-13: 8 [IU] via SUBCUTANEOUS

## 2014-02-12 MED ORDER — TRAZODONE HCL 50 MG PO TABS
100.0000 mg | ORAL_TABLET | Freq: Every evening | ORAL | Status: DC | PRN
  Administered 2014-02-13 – 2014-02-21 (×8): 100 mg via ORAL
  Filled 2014-02-12 (×8): qty 2

## 2014-02-12 MED ORDER — LEVOTHYROXINE SODIUM 150 MCG PO TABS
150.0000 ug | ORAL_TABLET | Freq: Every day | ORAL | Status: DC
  Administered 2014-02-13 – 2014-02-22 (×10): 150 ug via ORAL
  Filled 2014-02-12 (×12): qty 1

## 2014-02-12 NOTE — Progress Notes (Addendum)
Progress Note   Frank Ward QMG:[id number] DOB: 20-Jul-1971 DOA: 02/06/2014 PCP: No PCP Per Patient   Brief Narrative:   Frank Ward is an 43 y.o. 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  Assessment/Plan:   Principal Problem:   Acute encephalopathy  Multifactorial with possible drug overdose responsible for his initial unresponsiveness.  CT of the head negative for acute changes except for his known metastatic disease.  Active Problems:   Steroid-induced hyperglycemia  Start SSI, moderate scale.    Hypothyroidism status post thyroidectomy  Continue Synthroid. Goal TSH less than 1.0. Most recent TSH 5.80, down from 27.12 approximately one month ago.  Increase Synthroid to 150 mcg as recommended by Dr. Dwyane Dee of endocrinology 01/20/14.    Psychotic disorder NOS  Lack capacity for medical decision-making. Aggressive behavior appears to be centered around obtaining pain medications, however he does have known metastatic cancer with bone involvement, receiving active radiation treatment to sacrum and T12-L2. Has known brain metastasis.  Continue Seroquel, trazodone, and as needed Ativan.    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 Currently receiving radiation therapy.  Continue neck collar per prior neurosurgery recommendations.  Continue oral  Decadron.  Continue pain management efforts with Dilaudid, Ibuprofen.  Continue Synthroid.  Palliative care consultation requested for assistance with pain management.    Ulcer of left dorsal foot  Wound care per wound care RN recommendations.    DVT Prophylaxis  Continue Lovenox.  Code Status: Full. Family Communication: No family at the bedside. Disposition Plan: Home when stable.   IV Access:    Peripheral IV   Procedures:    Right lower extremity venous Doppler 02/08/14: No DVT or SVT in the right lower extremity.   Medical Consultants:    Dr. Rhea Pink, Palliative care  Dr. Eppie Gibson, Radiation oncology  Dr. Durward Parcel, Psychiatry   Other Consultants:    None.   Anti-Infectives:    None.  Subjective:   Frank Ward tells me that pain medicines typically last one-1-1/2 hours. His pain is in his neck and back. His appetite is good and he reports that he is not constipated.  Objective:    Filed Vitals:   02/11/14 0700 02/11/14 1345 02/11/14 2315 02/12/14 0537  BP: 136/86 144/91 137/85 122/78  Pulse: 90 102 107 102  Temp: 98.7 F (37.1 C) 97.6 F (36.4 C) 98.1 F (36.7 C) 98.3 F (36.8 C)  TempSrc: Oral Oral Oral Oral  Resp: 20 18 20 20   Height:      Weight:      SpO2: 98% 94% 96% 97%    Intake/Output Summary (Last 24 hours) at 02/12/14 1506 Last data filed at 02/11/14 1900  Gross per 24 hour  Intake    480 ml  Output      0 ml  Net    480 ml    Exam: Gen:  NAD Cardiovascular:  RRR, No M/R/G Respiratory:  Lungs CTAB Gastrointestinal:  Abdomen softly distended, + BS Extremities:  1-2+ edema   Data Reviewed:    Labs: Basic Metabolic Panel:  Recent Labs Lab 02/07/14 0228 02/07/14 0640 02/08/14 0440 02/10/14 0500 02/12/14 0405  NA 136* 135* 133* 139 138  K 3.4* 3.8 4.2 4.0 4.0  CL 100 99 96 102 101  CO2 22 23 25 24 25   GLUCOSE 135* 143* 147* 236* 211*  BUN 3* 3* 10 11 13    CREATININE 0.87 0.84 1.15 0.91 0.76  CALCIUM 8.6 9.0 10.4 8.9 8.8   GFR Estimated Creatinine Clearance: 141.7 ml/min (by C-G formula based on Cr of 0.76). Liver Function Tests:  Recent Labs Lab 02/07/14 0228 02/07/14 0640 02/12/14 0405  AST 14 16 13   ALT 19 20 15   ALKPHOS 86 92 89  BILITOT 0.2* 0.2* <0.2*  PROT 5.7* 6.2 5.2*  ALBUMIN 3.0* 3.2* 2.8*    Recent Labs Lab 02/12/14 0400  AMMONIA 32    CBC:  Recent Labs Lab 02/07/14 0228 02/07/14 0640 02/08/14 0440  WBC 5.5 3.8* 7.7  NEUTROABS 5.2 3.5  --   HGB 10.9* 11.2* 12.0*  HCT 32.1* 33.6* 36.5*  MCV 90.2 91.1 91.9  PLT 314 304 417*   Sepsis Labs:  Recent Labs Lab 02/07/14 0228 02/07/14 0241 02/07/14 0640 02/08/14 0440  WBC 5.5  --  3.8* 7.7  LATICACIDVEN  --  0.55  --   --    Microbiology No results found for this or any previous visit (from the past 240 hour(s)).   Radiographs/Studies:   Dg Chest 2 View  02/06/2014   CLINICAL DATA:  Altered mental status.  EXAM: CHEST  2 VIEW  COMPARISON:  12/09/2013  FINDINGS: Normal heart size and pulmonary vascularity. Multiple diffuse metastases throughout the lungs, similar to prior study. No developing airspace disease or consolidation. No blunting of costophrenic angles. No pneumothorax. Destructive bone lesion in the right scapula is probably metastatic.  IMPRESSION: Diffuse bilateral pulmonary metastases. Destructive bone metastasis in the right scapula.   Electronically Signed   By: Lucienne Capers M.D.   On: 02/06/2014 23:03   Ct Head Wo Contrast  02/06/2014   CLINICAL DATA:  Headaches and confusion.  EXAM: CT HEAD WITHOUT CONTRAST  TECHNIQUE: Contiguous axial images were obtained from the base of the skull through the vertex without intravenous contrast.  COMPARISON:  CT head 01/16/2013.  MRI brain 12/26/2013.  FINDINGS: 1.3 cm diameter mass lesion in the fourth ventricle consistent with metastatic disease is enlarged since previous CT scan but appears  stable since the prior MRI. Tiny additional nodular lesions demonstrated on the MRI are not appreciable on CT. Ventricles and sulci appear otherwise symmetrical. No significant ventricular dilatation. No abnormal extra-axial fluid collections. Gray-white matter junctions are distinct. Basal cisterns are not effaced. No evidence of acute intracranial hemorrhage. Calvarium appears intact. Opacification of the right frontal sinus with mucosal thickening in the maxillary antra bilaterally. Mastoid air cells are not opacified.  IMPRESSION: Re identification of known mass in the fourth ventricle. No acute intracranial abnormalities are suspected. Chronic inflammatory changes in the sinuses.   Electronically Signed   By: Lucienne Capers M.D.   On: 02/06/2014 23:38    Medications:   . dexamethasone  4 mg Oral 4 times per day  . enoxaparin (LOVENOX) injection  40 mg Subcutaneous Q24H  . furosemide  20 mg Oral Daily  . levothyroxine  100 mcg Oral QAC breakfast  . morphine  60 mg Oral Q12H  . pantoprazole  40 mg Oral Daily  . QUEtiapine  100 mg Oral Daily   Continuous Infusions:   Time spent: 35 minutes with > 50% of time discussing current diagnostic test results, clinical impression and plan of care.    LOS: 6 days   [facility] Pager [phone]. If unable to reach me by pager, please call my cell phone at [phone].  *Please refer to [email or web address], password [email or web address] to get updated schedule on who will round on this patient, as hospitalists switch teams weekly. If 7PM-7AM, please contact night-coverage at [email or web address], password [email or web address] for any overnight needs.  02/12/2014, 3:06 PM    **Disclaimer: This note was dictated with voice recognition software. Similar sounding words can inadvertently be transcribed and this note may contain transcription errors which may not have been corrected upon publication of note.**

## 2014-02-12 NOTE — Consult Note (Signed)
[facility] Face-to-Face Psychiatry Consult   Reason for Consult: psychosis with agitation Referring Physician:   Dr. Rockne Menghini [name] is an 43 y.o. male. 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 Cancer     Thyroid  . Thyroid disease   . Hypertension   . Pneumonia    AXIS IV:  economic problems, occupational problems, other psychosocial or environmental problems, problems related to social environment and problems with primary support group AXIS V:  41-50 serious symptoms  Plan: Case discussed with Rama, staff RN and Sindy Messing, LCSW Continue seroquel 153m Qhs, which can be titrated up to 600 mg a day if clinically required Start Trazodone 100 mg PO Qhs /PRN for insomnia Patient does not meet criteria for psychiatric inpatient admission. Supportive therapy provided about ongoing stressors. will provide abilify or haldol which was taken during last admission. Appreciate psychiatric consultation and follow up as clinically required Please contact [phone] if needs further assistance  Subjective:   Frank Ward a 43y.o. male patient admitted with confusion and agitation.  HPI:  Patient is seen along with HSindy Messingfor psychiatric consultation for frequent agitation and medication management. Patient is known to this provider and other treatment team from his previous hospitalization for the same issues. Patient is able to recognize this provider as psychiatrist even before introducing. He states that he is smart and knows his current medical condition and his mood changes. He stated that [place] and than become unconscious required [facility] Patient is known to non compliant with  his medications and out patient radiation treatment in the past. He wants to be treated while in hospital because of his transportation issues and limited psychosocial support. He has history of heart attack, pneumonia and previously restrained because of agitation and aggression. he was also arrested while in hospital about six months ago. Patient denied current symptoms of psychosis and suicidal or homicidal ideations. He has spontaneous and pressured speech and tangential thought process.   Medial history: Frank Ward a 43y.o. male with history of metastatic papillary thyroid cancer to the spine and brain who was recently discharged from the hospital 10 days ago after being treated for pain related behavioral issues [place]. [facility] was found to be confused and CT head did not show anything acute except for the known metastasis. Patient was nonfocal on exam. ED physician had discussed with on-call oncologist and at this time they have advised to admit patient place him on Decadron and pain relief medications and consult patient's radiation oncologist in a.m. On exam patient presently is alert awake oriented to time place and person. Moves all extremities. Still has pain particularly in the low back. Patient denies any chest pain short breath nausea vomiting abdominal pain. During last stay patient had behavioral issues and psychiatric consult was pain and at that time psychiatrist had recommended when necessary IV stat IM Haldol and Abilify. Patient during the last he also had left lower extremity cellulitis and at this time patient does have a small ulcer on the  left dorsal aspect of his left foot. There is no active discharge at this time.  Review of Systems: As presented in the history of presenting illness, rest negative  HPI Elements:   Location:  psychosis and  agitation. Quality:  poor'. Severity:  acute. Timing:  multiple psychosocial, medical and non compliance.  Past Psychiatric History: Past Medical History  Diagnosis Date  . Cancer     Thyroid  . Thyroid disease   . Hypertension   . Pneumonia     reports that he quit smoking about 2 years ago. His smoking use included Cigarettes. He smoked 0.00 packs per day for 10 years. He has never used smokeless tobacco. He reports that he drinks about 4.8 ounces of alcohol per week. He reports that he uses illicit drugs (Marijuana). Family History  Problem Relation Age of Onset  . Diabetes Mother   . Diabetes Brother      Living Arrangements: Other relatives   Abuse/Neglect [facility]) Physical Abuse: Denies Verbal Abuse: Denies Sexual Abuse: Denies Allergies:   Allergies  Allergen Reactions  . Penicillins Anaphylaxis, Hives and Itching  . Latex Hives and Itching  . Morphine And Related Hives, Nausea And Vomiting and Swelling    Swelling in body    ACT Assessment Complete:  NO Objective: Blood pressure 122/78, pulse 102, temperature 98.3 F (36.8 C), temperature source Oral, resp. rate 20, height '6\' 4"'  (1.93 m), weight 85.866 kg (189 lb 4.8 oz), SpO2 97.00%.Body mass index is 23.05 kg/(m^2). Results for orders placed during the hospital encounter of 02/06/14 (from the past 72 hour(s))  BASIC METABOLIC PANEL     Status: Abnormal   Collection Time    02/10/14  5:00 AM      Result Value Ref Range   Sodium 139  137 - 147 mEq/L   Potassium 4.0  3.7 - 5.3 mEq/L   Chloride 102  96 - 112 mEq/L   CO2 24  19 - 32 mEq/L   Glucose, Bld 236 (*) 70 - 99 mg/dL   BUN 11  6 - 23 mg/dL   Creatinine, Ser 0.91  0.50 - 1.35 mg/dL   Calcium 8.9  8.4 - 10.5 mg/dL   GFR calc non Af Amer >90  >90 mL/min   GFR calc Af Amer >90  >90 mL/min   Comment: (NOTE)     The eGFR has been calculated using the CKD EPI equation.     This calculation has not been validated in all clinical situations.     eGFR's  persistently <90 mL/min signify possible Chronic Kidney     Disease.  AMMONIA     Status: None   Collection Time    02/12/14  4:00 AM      Result Value Ref Range   Ammonia 32  11 - 60 umol/L  HEPATIC FUNCTION PANEL     Status: Abnormal   Collection Time    02/12/14  4:05 AM      Result Value Ref Range   Total Protein 5.2 (*) 6.0 - 8.3 g/dL   Albumin 2.8 (*) 3.5 - 5.2 g/dL   AST 13  0 - 37 U/L   ALT 15  0 - 53 U/L   Alkaline Phosphatase 89  39 - 117 U/L   Total Bilirubin <0.2 (*) 0.3 - 1.2 mg/dL   Bilirubin, Direct <0.2  0.0 - 0.3 mg/dL   Indirect Bilirubin NOT CALCULATED  0.3 - 0.9 mg/dL  BASIC METABOLIC PANEL  Status: Abnormal   Collection Time    02/12/14  4:05 AM      Result Value Ref Range   Sodium 138  137 - 147 mEq/L   Potassium 4.0  3.7 - 5.3 mEq/L   Chloride 101  96 - 112 mEq/L   CO2 25  19 - 32 mEq/L   Glucose, Bld 211 (*) 70 - 99 mg/dL   BUN 13  6 - 23 mg/dL   Creatinine, Ser 0.76  0.50 - 1.35 mg/dL   Calcium 8.8  8.4 - 10.5 mg/dL   GFR calc non Af Amer >90  >90 mL/min   GFR calc Af Amer >90  >90 mL/min   Comment: (NOTE)     The eGFR has been calculated using the CKD EPI equation.     This calculation has not been validated in all clinical situations.     eGFR's persistently <90 mL/min signify possible Chronic Kidney     Disease.   Labs are reviewed and are pertinent for .  Current Facility-Administered Medications  Medication Dose Route Frequency Provider Last Rate Last Dose  . acetaminophen (TYLENOL) tablet 650 mg  650 mg Oral Q6H PRN Rise Patience, MD       Or  . acetaminophen (TYLENOL) suppository 650 mg  650 mg Rectal Q6H PRN Rise Patience, MD      . dexamethasone (DECADRON) tablet 4 mg  4 mg Oral 4 times per day Elmarie Shiley, MD   4 mg at 02/12/14 8657  . docusate sodium (COLACE) capsule 100 mg  100 mg Oral BID PRN Rise Patience, MD      . enoxaparin (LOVENOX) injection 40 mg  40 mg Subcutaneous Q24H Rise Patience, MD    40 mg at 02/12/14 1027  . furosemide (LASIX) tablet 20 mg  20 mg Oral Daily Rise Patience, MD   20 mg at 02/12/14 1028  . HYDROmorphone (DILAUDID) injection 2 mg  2 mg Intravenous Q2H PRN Belkys A Regalado, MD   2 mg at 02/12/14 1027  . HYDROmorphone (DILAUDID) tablet 4 mg  4 mg Oral Q4H PRN Belkys A Regalado, MD   4 mg at 02/10/14 1836  . ibuprofen (ADVIL,MOTRIN) tablet 200-400 mg  200-400 mg Oral Q6H PRN Rise Patience, MD      . levothyroxine (SYNTHROID, LEVOTHROID) tablet 100 mcg  100 mcg Oral QAC breakfast Rise Patience, MD   100 mcg at 02/12/14 [phone]  . LORazepam (ATIVAN) tablet 1 mg  1 mg Oral Q8H PRN Rise Patience, MD   1 mg at 02/11/14 2207  . morphine (MS CONTIN) 12 hr tablet 60 mg  60 mg Oral Q12H Rise Patience, MD   60 mg at 02/12/14 1027  . ondansetron (ZOFRAN) tablet 4 mg  4 mg Oral Q6H PRN Rise Patience, MD       Or  . ondansetron [facility]) injection 4 mg  4 mg Intravenous Q6H PRN Rise Patience, MD      . pantoprazole (PROTONIX) EC tablet 40 mg  40 mg Oral Daily Rise Patience, MD   40 mg at 02/12/14 1028  . QUEtiapine (SEROQUEL XR) 24 hr tablet 100 mg  100 mg Oral Daily Belkys A Regalado, MD   100 mg at 02/12/14 1027    Psychiatric Specialty Exam: Physical Exam Full physical performed in Emergency Department. I have reviewed this assessment and concur with its findings.   Review of Systems  Musculoskeletal:  Positive for back pain, myalgias and neck pain.  Psychiatric/Behavioral: Positive for depression. The patient is nervous/anxious and has insomnia.     Blood pressure 122/78, pulse 102, temperature 98.3 F (36.8 C), temperature source Oral, resp. rate 20, height '6\' 4"'  (1.93 m), weight 85.866 kg (189 lb 4.8 oz), SpO2 97.00%.Body mass index is 23.05 kg/(m^2).  General Appearance: Guarded  Eye Contact::  Good  Speech:  Pressured  Volume:  Increased  Mood:  Anxious, Depressed and Irritable  Affect:  Appropriate and Congruent   Thought Process:  Coherent and Goal Directed  Orientation:  Full (Time, Place, and Person)  Thought Content:  Rumination  Suicidal Thoughts:  No  Homicidal Thoughts:  No  Memory:  Immediate;   Fair  Judgement:  Impaired  Insight:  Fair  Psychomotor Activity:  Restlessness  Concentration:  Fair  Recall:  [employer] of Knowledge:Fair  Language: Good  Akathisia:  NA  Handed:  Right  AIMS (if indicated):     Assets:  Communication Skills Desire for [employer] Talents/Skills Transportation  Sleep:      Musculoskeletal: Strength & Muscle Tone: within normal limits Gait & Station: broad based Patient leans: N/A  Treatment Plan Summary: Daily contact with patient to assess and evaluate symptoms and progress in treatment Medication management  Onetha Gaffey,JANARDHAHA R. 02/12/2014 10:43 AM

## 2014-02-12 NOTE — Progress Notes (Signed)
Clinical Social Work Department CLINICAL SOCIAL WORK PSYCHIATRY SERVICE LINE ASSESSMENT 02/12/2014  Patient:  Frank Ward [facility]  Account:  [id number]  [place] Date:  02/06/2014  Clinical Social Worker:  Sindy Messing, LCSW  Date/Time:  02/12/2014 12:00 N Referred by:  Physician  Date referred:  02/12/2014 Reason for Referral  Psychosocial assessment   Presenting Symptoms/Problems (In the person's/family's own words):   Psych consulted due to aggressive behavior   Abuse/Neglect/Trauma History (check all that apply)  Denies history   Abuse/Neglect/Trauma Comments:   Psychiatric History (check all that apply)  Denies history   Psychiatric medications:  Seroquel 100 mg   Current Mental Health Hospitalizations/Previous Mental Health History:   Patient denies any MH concerns. Patient fixated on medical concerns and hospital stay and unable to fully discuss [place] needs.   Current provider:   None   Place and Date:   N/A   Current Medications:   Scheduled Meds:      . dexamethasone  4 mg Oral 4 times per day  . enoxaparin (LOVENOX) injection  40 mg Subcutaneous Q24H  . furosemide  20 mg Oral Daily  . levothyroxine  100 mcg Oral QAC breakfast  . morphine  60 mg Oral Q12H  . pantoprazole  40 mg Oral Daily  . QUEtiapine  100 mg Oral Daily        Continuous Infusions:      PRN Meds:.acetaminophen, acetaminophen, docusate sodium, HYDROmorphone (DILAUDID) injection, HYDROmorphone, ibuprofen, LORazepam, ondansetron (ZOFRAN) IV, ondansetron       Previous Impatient Admission/Date/Reason:   None reported   Emotional Health / Current Symptoms    Suicide/Self Harm  None reported   Suicide attempt in the past:   Patient denies any SI or HI.   Other harmful behavior:   None reported   Psychotic/Dissociative Symptoms  None reported   Other Psychotic/Dissociative Symptoms:    Attention/Behavioral Symptoms  Inattentive   Other Attention / Behavioral Symptoms:   Patient fixated  on hospital stay and is difficult to redirect.    Cognitive Impairment  Within Normal Limits   Other Cognitive Impairment:    Mood and Adjustment  Aggressive/frustrated    Stress, Anxiety, Trauma, Any Recent Loss/Stressor  None reported   Anxiety (frequency):   N/A   Phobia (specify):   N/A   Compulsive behavior (specify):   N/A   Obsessive behavior (specify):   N/A   Other:   N/A   Substance Abuse/Use  None   SBIRT completed (please refer for detailed history):  N  Self-reported substance use:   Patient reports he was taking medication that sister was prescribed to help with pain and sleeping.   Urinary Drug Screen Completed:  N Alcohol level:   N/A    Environmental/Housing/Living Arrangement  Stable housing   Who is in the home:   Mom/Sister   Emergency contact:  Film/video editor  Medicaid   Patient's Strengths and Goals (patient's own words):   Patient reports good relationship with sister.   Clinical Social Worker's Interpretive Summary:   CSW received referral in order to complete psychosocial assessment. CSW reviewed chart and met with patient at bedside. CSW introduced myself and explained role.    Patient reports that he was at [facility] for treatment and was forced to leave early. Patient reports he did not follow up with appointments for cancer because he did not have the means to get to appointments. Patient reports he drove himself down to [place] in order to stay  with his sister. When patient was staying with his sister, she provided him medication for pain and to help him sleep. Patient reports he went 3-4 days without sleeping but medication helped. Patient reports that he must of accidentally overdosed because he passed out in bathroom and was taken to the hospital. Patient reports he was then transferred back to [facility].    Patient reports that he is upset about care and does not want to leave the hospital until his radiation and  chemo is completed. Patient reports he is motivated to get better and does not want to die. Patient feels that if he can complete treatment then he will survive for the next 10-15 years. Patient reports his legs are getting better but he wants to be careful about medications because he does not to have any amputations.    Patient easily frustrated throughout assessment. Patient continues to report he does not like to be asked questions. Patient talks about problems the hospital and tells story of [employer] "jumping on him" during a previous admission. Patient admits to not complying with treatment on outpatient basis but reports he wants treatment now.    CSW will continue to follow to offer support.   Disposition:  Recommend Psych CSW continuing to support while in hospital   [name], [name] [phone]

## 2014-02-13 ENCOUNTER — Ambulatory Visit
Admission: RE | Admit: 2014-02-13 | Discharge: 2014-02-13 | Disposition: A | Discharge status: Home / Self Care | Payer: Medicaid Other | Source: Ambulatory Visit | Attending: Radiation Oncology | Admitting: Radiation Oncology

## 2014-02-13 ENCOUNTER — Ambulatory Visit: Payer: Medicaid Other

## 2014-02-13 DIAGNOSIS — L97509 Non-pressure chronic ulcer of other part of unspecified foot with unspecified severity: Secondary | ICD-10-CM | POA: Diagnosis present

## 2014-02-13 DIAGNOSIS — IMO0002 Reserved for concepts with insufficient information to code with codable children: Secondary | ICD-10-CM

## 2014-02-13 DIAGNOSIS — T380X5A Adverse effect of glucocorticoids and synthetic analogues, initial encounter: Secondary | ICD-10-CM | POA: Diagnosis present

## 2014-02-13 DIAGNOSIS — R7309 Other abnormal glucose: Secondary | ICD-10-CM

## 2014-02-13 DIAGNOSIS — R739 Hyperglycemia, unspecified: Secondary | ICD-10-CM | POA: Diagnosis present

## 2014-02-13 DIAGNOSIS — F29 Unspecified psychosis not due to a substance or known physiological condition: Secondary | ICD-10-CM | POA: Diagnosis present

## 2014-02-13 LAB — GLUCOSE, CAPILLARY
GLUCOSE-CAPILLARY: 289 mg/dL — AB (ref 70–99)
Glucose-Capillary: 263 mg/dL — ABNORMAL HIGH (ref 70–99)
Glucose-Capillary: 299 mg/dL — ABNORMAL HIGH (ref 70–99)
Glucose-Capillary: 175 mg/dL — ABNORMAL HIGH (ref 70–99)

## 2014-02-13 MED ORDER — INSULIN ASPART 100 UNIT/ML ~~LOC~~ SOLN
0.0000 [IU] | Freq: Three times a day (TID) | SUBCUTANEOUS | Status: DC
  Administered 2014-02-13 (×2): 11 [IU] via SUBCUTANEOUS
  Administered 2014-02-14: 20 [IU] via SUBCUTANEOUS
  Administered 2014-02-14 – 2014-02-15 (×2): 11 [IU] via SUBCUTANEOUS
  Administered 2014-02-15 – 2014-02-16 (×2): 4 [IU] via SUBCUTANEOUS
  Administered 2014-02-16: 3 [IU] via SUBCUTANEOUS
  Administered 2014-02-17: 11 [IU] via SUBCUTANEOUS
  Administered 2014-02-17 (×2): 7 [IU] via SUBCUTANEOUS
  Administered 2014-02-18: 15 [IU] via SUBCUTANEOUS
  Administered 2014-02-18 – 2014-02-19 (×2): 7 [IU] via SUBCUTANEOUS
  Administered 2014-02-19: 4 [IU] via SUBCUTANEOUS
  Administered 2014-02-20 (×2): 7 [IU] via SUBCUTANEOUS
  Administered 2014-02-20: 4 [IU] via SUBCUTANEOUS
  Administered 2014-02-21: 5 [IU] via SUBCUTANEOUS
  Administered 2014-02-21 – 2014-02-22 (×3): 4 [IU] via SUBCUTANEOUS

## 2014-02-13 MED ORDER — INSULIN ASPART 100 UNIT/ML ~~LOC~~ SOLN
0.0000 [IU] | Freq: Every day | SUBCUTANEOUS | Status: DC
  Administered 2014-02-17: 3 [IU] via SUBCUTANEOUS
  Administered 2014-02-21: 2 [IU] via SUBCUTANEOUS

## 2014-02-13 MED ORDER — INSULIN ASPART 100 UNIT/ML ~~LOC~~ SOLN
6.0000 [IU] | Freq: Three times a day (TID) | SUBCUTANEOUS | Status: DC
  Administered 2014-02-13: 6 [IU] via SUBCUTANEOUS

## 2014-02-13 MED ORDER — MAGNESIUM CITRATE PO SOLN
1.0000 | Freq: Once | ORAL | Status: AC
  Administered 2014-02-13: 1 via ORAL

## 2014-02-13 NOTE — Plan of Care (Signed)
Problem: Phase I Progression Outcomes Goal: Pain controlled with appropriate interventions Outcome: Progressing Patient continues to rate pain 6-8/10 prior to prns being given. Pain in r shoulder/neck and hips and legs mainly. Report burning like pain in legs at times as well.

## 2014-02-13 NOTE — Plan of Care (Signed)
Problem: Phase III Progression Outcomes Goal: Activity at appropriate level-compared to baseline (UP IN CHAIR FOR HEMODIALYSIS)  Outcome: Completed/Met Date Met:  02/13/14 Ambulating in hallway on own x2 tonight.

## 2014-02-13 NOTE — Progress Notes (Signed)
Pt was reclining in bed when I arrived. We had a long discussion today, mostly about his faith. He discussed his mortality; he said he wants to go to heaven but also believes God is telling him he is going to be here a while longer. Pt raised questions about his faith and whether he waited until it was too late. I discussed w/him that too late is when he is no longer here.  When pt began talking negatively, I tried to assure him that the staff really cares about him. Pt talked about his relatives and described a lot of disfunction, including the visit from his aunts on Tuesday. Pt was very positive, for the greater portion of our visit. Communicated w/Chaplain how proud I am with how he is coping with issues surrounding family, his cancer, pain and treatment. Pt was very appreciative of visit and the time taken to listen and talk.  Ernest Haber Chaplain  02/13/14 1100  Clinical Encounter Type  Visited With Patient

## 2014-02-13 NOTE — Progress Notes (Signed)
Progress Note   Frank Ward JSH:[id number] DOB: 01/27/1971 DOA: 02/06/2014 PCP: No PCP Per Patient   Brief Narrative:   Frank Ward is an 43 y.o. 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  Assessment/Plan:   Principal Problem:   Acute encephalopathy  Multifactorial with possible drug overdose responsible for his initial unresponsiveness.  CT of the head negative for acute changes except for his known metastatic disease.  Resolved, patient at baseline mental status.  Active Problems:   Steroid-induced hyperglycemia  CBGs 240-317, change SSI to insulin resistant scale and at meal coverage.    Hypothyroidism status post thyroidectomy  Continue Synthroid. Goal TSH less than 1.0. Most recent TSH 5.80, down from 27.12 approximately one month ago.  Increase Synthroid to 150 mcg as recommended by Dr. Dwyane Dee of endocrinology 01/20/14.    Psychotic disorder NOS  Lack capacity for medical decision-making. Aggressive behavior appears to be centered around obtaining pain medications, however he does have known metastatic cancer with bone involvement, receiving active radiation treatment to sacrum and T12-L2. Has known brain metastasis.  Continue Seroquel, trazodone, and as needed Ativan.    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 Currently receiving radiation  therapy.  Continue neck collar per prior neurosurgery recommendations.  Continue oral Decadron.  Continue pain management efforts with Dilaudid, Ibuprofen.  Continue Synthroid.  Palliative care consultation requested for assistance with pain management.    Ulcer of left dorsal foot  Wound care per wound care RN recommendations.    DVT Prophylaxis  Continue Lovenox.  Code Status: Full. Family Communication: No family at the bedside. Disposition Plan: Home when stable.   IV Access:    Peripheral IV   Procedures:    Right lower extremity venous Doppler 02/08/14: No DVT or SVT in the right lower extremity.   Medical Consultants:    Dr. Rhea Pink, Palliative care  Dr. Eppie Gibson, Radiation oncology  Dr. Durward Parcel, Psychiatry   Other Consultants:    None.   Anti-Infectives:    None.  Subjective:   Frank Ward has been calm and cooperative. He still has back pain and is receiving Dilaudid-HP every 2 hours for pain control. Palliative care consultation pending for assistance with further pain control.  Objective:    Filed Vitals:   02/12/14 0537 02/12/14 1500 02/12/14 2315 02/13/14 0643  BP: 122/78 119/58 133/75 140/81  Pulse: 102 86 101 88  Temp: 98.3 F (36.8 C) 98.5 F (36.9 C) 98.3 F (36.8 C) 98.2 F (36.8 C)  TempSrc: Oral Oral Oral Oral  Resp: 20 18 20 20   Height:      Weight:      SpO2: 97% 98% 99% 98%    Intake/Output Summary (Last 24 hours) at 02/13/14 1554 Last data filed at 02/13/14 0846  Gross per 24 hour  Intake    960 ml  Output      0 ml  Net    960 ml  Exam: Gen:  NAD Cardiovascular:  RRR, No M/R/G Respiratory:  Lungs CTAB Gastrointestinal:  Abdomen softly distended, + BS Extremities:  1-2+ edema   Data Reviewed:    Labs: Basic Metabolic Panel:  Recent Labs Lab 02/07/14 0228 02/07/14 0640 02/08/14 0440 02/10/14 0500 02/12/14 0405  NA 136* 135* 133* 139 138  K 3.4*  3.8 4.2 4.0 4.0  CL 100 99 96 102 101  CO2 22 23 25 24 25   GLUCOSE 135* 143* 147* 236* 211*  BUN 3* 3* 10 11 13   CREATININE 0.87 0.84 1.15 0.91 0.76  CALCIUM 8.6 9.0 10.4 8.9 8.8   GFR Estimated Creatinine Clearance: 141.7 ml/min (by C-G formula based on Cr of 0.76). Liver Function Tests:  Recent Labs Lab 02/07/14 0228 02/07/14 0640 02/12/14 0405  AST 14 16 13   ALT 19 20 15   ALKPHOS 86 92 89  BILITOT 0.2* 0.2* <0.2*  PROT 5.7* 6.2 5.2*  ALBUMIN 3.0* 3.2* 2.8*    Recent Labs Lab 02/12/14 0400  AMMONIA 32   CBC:  Recent Labs Lab 02/07/14 0228 02/07/14 0640 02/08/14 0440  WBC 5.5 3.8* 7.7  NEUTROABS 5.2 3.5  --   HGB 10.9* 11.2* 12.0*  HCT 32.1* 33.6* 36.5*  MCV 90.2 91.1 91.9  PLT 314 304 417*   CBG:  Recent Labs Lab 02/12/14 1650 02/12/14 2306 02/13/14 0726 02/13/14 1158  GLUCAP 317* 240* 289* 263*     Radiographs/Studies:   Dg Chest 2 View  02/06/2014   CLINICAL DATA:  Altered mental status.  EXAM: CHEST  2 VIEW  COMPARISON:  12/09/2013  FINDINGS: Normal heart size and pulmonary vascularity. Multiple diffuse metastases throughout the lungs, similar to prior study. No developing airspace disease or consolidation. No blunting of costophrenic angles. No pneumothorax. Destructive bone lesion in the right scapula is probably metastatic.  IMPRESSION: Diffuse bilateral pulmonary metastases. Destructive bone metastasis in the right scapula.   Electronically Signed   By: Lucienne Capers M.D.   On: 02/06/2014 23:03   Ct Head Wo Contrast  02/06/2014   CLINICAL DATA:  Headaches and confusion.  EXAM: CT HEAD WITHOUT CONTRAST  TECHNIQUE: Contiguous axial images were obtained from the base of the skull through the vertex without intravenous contrast.  COMPARISON:  CT head 01/16/2013.  MRI brain 12/26/2013.  FINDINGS: 1.3 cm diameter mass lesion in the fourth ventricle consistent with metastatic disease is enlarged since previous CT scan but appears stable since the  prior MRI. Tiny additional nodular lesions demonstrated on the MRI are not appreciable on CT. Ventricles and sulci appear otherwise symmetrical. No significant ventricular dilatation. No abnormal extra-axial fluid collections. Gray-white matter junctions are distinct. Basal cisterns are not effaced. No evidence of acute intracranial hemorrhage. Calvarium appears intact. Opacification of the right frontal sinus with mucosal thickening in the maxillary antra bilaterally. Mastoid air cells are not opacified.  IMPRESSION: Re identification of known mass in the fourth ventricle. No acute intracranial abnormalities are suspected. Chronic inflammatory changes in the sinuses.   Electronically Signed   By: Lucienne Capers M.D.   On: 02/06/2014 23:38    Medications:   . dexamethasone  4 mg Oral 4 times per day  . enoxaparin (LOVENOX) injection  40 mg Subcutaneous Q24H  . furosemide  20 mg Oral Daily  . insulin aspart  0-15 Units Subcutaneous TID WC  . insulin aspart  0-5 Units Subcutaneous QHS  . levothyroxine  150 mcg Oral QAC breakfast  . magnesium citrate  1 Bottle Oral Once  .  morphine  60 mg Oral Q12H  . pantoprazole  40 mg Oral Daily  . QUEtiapine  100 mg Oral Daily   Continuous Infusions:   Time spent:  25 minutes.    LOS: 7 days   [facility] Pager [phone]. If unable to reach me by pager, please call my cell phone at [phone].  *Please refer to [email or web address], password [email or web address] to get updated schedule on who will round on this patient, as hospitalists switch teams weekly. If 7PM-7AM, please contact night-coverage at [email or web address], password [email or web address] for any overnight needs.  02/13/2014, 3:54 PM    **Disclaimer: This note was dictated with voice recognition software. Similar sounding words can inadvertently be transcribed and this note may contain transcription errors which may not have been corrected upon publication of note.**

## 2014-02-13 NOTE — Consult Note (Signed)
[facility] Face-to-Face Psychiatry Consult   Reason for Consult: psychosis with agitation; insomnia Referring Physician:   Dr. Rockne Menghini Frank Ward is an 43 y.o. male. 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 Cancer     Thyroid  . Thyroid disease   . Hypertension   . Pneumonia    AXIS IV:  economic problems, occupational problems, other psychosocial or environmental problems, problems related to social environment and problems with primary support group AXIS V:  41-50 serious symptoms  Plan: Case discussed with Rama, staff RN and Sindy Messing, LCSW Continue Seroquel 126m Qhs, which can be titrated up to 600 mg a day if clinically required ContinueTrazodone 100 mg PO Qhs /PRN for insomnia Patient does not meet criteria for psychiatric inpatient admission. Supportive therapy provided about ongoing stressors. will provide abilify or haldol which was taken during last admission. Appreciate psychiatric consultation and follow up as clinically required Please contact [phone] if needs further assistance  Subjective:   Frank Ward a 43y.o. male patient admitted with confusion and agitation.  HPI:  Patient is seen along with HSindy Messingfor psychiatric consultation for frequent agitation and medication management. Patient is known to this provider and other treatment team from his previous hospitalization for the same issues. Patient is able to recognize this provider as psychiatrist even before introducing. He states that he is smart and knows his current medical condition and his mood changes. He stated [place] and than become unconscious required to be [facility] Patient is known to non  compliant with his medications and out patient radiation treatment in the past. He wants to be treated while in hospital because of his transportation issues and limited psychosocial support. He has history of heart attack, pneumonia and previously restrained because of agitation and aggression. he was also arrested while in hospital about six months ago. Patient denied current symptoms of psychosis and suicidal or homicidal ideations. He has spontaneous and pressured speech and tangential thought process.   Interval History: Patient is seen for psychiatric consultation follow up. Ppatient stated that he is feeling some what better, lesser paint due to his current medication, has positive attitude towards his current treatment and radiation treatment in near future. He has disturbed sleep and wants to continue taking his medication as prescribed. Patient is referred to case management regarding possible housing when he gets ready to be discharged. He has no contact numbers to his aunts because his mobile is missed. He has poor eye contact during this visit and talking while seeing television. He has appropriate responses and cooperative with staff regarding his medication and other treatments.   Medial history: Frank Ward a 43y.o. male with history of metastatic papillary thyroid cancer to the spine and brain who was recently discharged from the hospital 10 days ago after being treated for pain related behavioral issues [place]. [facility] was found to be confused and CT head did not show anything acute except for the known metastasis. Patient was nonfocal on exam. ED physician had discussed with on-call oncologist  and at this time they have advised to admit patient place him on Decadron and pain relief medications and consult patient's radiation oncologist in a.m. On exam  patient presently is alert awake oriented to time place and person. Moves all extremities. Still has pain particularly in the low back. Patient denies any chest pain short breath nausea vomiting abdominal pain. During last stay patient had behavioral issues and psychiatric consult was pain and at that time psychiatrist had recommended when necessary IV stat IM Haldol and Abilify. Patient during the last he also had left lower extremity cellulitis and at this time patient does have a small ulcer on the left dorsal aspect of his left foot. There is no active discharge at this time.  Review of Systems: As presented in the history of presenting illness, rest negative  HPI Elements:   Location:  psychosis and agitation. Quality:  poor'. Severity:  acute. Timing:  multiple psychosocial, medical and non compliance.  Past Psychiatric History: Past Medical History  Diagnosis Date  . Cancer     Thyroid  . Thyroid disease   . Hypertension   . Pneumonia     reports that he quit smoking about 2 years ago. His smoking use included Cigarettes. He smoked 0.00 packs per day for 10 years. He has never used smokeless tobacco. He reports that he drinks about 4.8 ounces of alcohol per week. He reports that he uses illicit drugs (Marijuana). Family History  Problem Relation Age of Onset  . Diabetes Mother   . Diabetes Brother      Living Arrangements: Other relatives   Abuse/Neglect [facility]) Physical Abuse: Denies Verbal Abuse: Denies Sexual Abuse: Denies Allergies:   Allergies  Allergen Reactions  . Penicillins Anaphylaxis, Hives and Itching  . Latex Hives and Itching  . Morphine And Related Hives, Nausea And Vomiting and Swelling    Swelling in body    ACT Assessment Complete:  NO Objective: Blood pressure 140/81, pulse 88, temperature 98.2 F (36.8 C), temperature source Oral, resp. rate 20, height '6\' 4"'  (1.93 m), weight 85.866 kg (189 lb 4.8 oz), SpO2 98.00%.Body mass index is 23.05  kg/(m^2). Results for orders placed during the hospital encounter of 02/06/14 (from the past 72 hour(s))  AMMONIA     Status: None   Collection Time    02/12/14  4:00 AM      Result Value Ref Range   Ammonia 32  11 - 60 umol/L  HEPATIC FUNCTION PANEL     Status: Abnormal   Collection Time    02/12/14  4:05 AM      Result Value Ref Range   Total Protein 5.2 (*) 6.0 - 8.3 g/dL   Albumin 2.8 (*) 3.5 - 5.2 g/dL   AST 13  0 - 37 U/L   ALT 15  0 - 53 U/L   Alkaline Phosphatase 89  39 - 117 U/L   Total Bilirubin <0.2 (*) 0.3 - 1.2 mg/dL   Bilirubin, Direct <0.2  0.0 - 0.3 mg/dL   Indirect Bilirubin NOT CALCULATED  0.3 - 0.9 mg/dL  BASIC METABOLIC PANEL     Status: Abnormal   Collection Time    02/12/14  4:05 AM      Result Value Ref Range   Sodium 138  137 - 147 mEq/L   Potassium 4.0  3.7 - 5.3 mEq/L   Chloride 101  96 - 112 mEq/L   CO2 25  19 - 32 mEq/L   Glucose, Bld 211 (*)  70 - 99 mg/dL   BUN 13  6 - 23 mg/dL   Creatinine, Ser 0.76  0.50 - 1.35 mg/dL   Calcium 8.8  8.4 - 10.5 mg/dL   GFR calc non Af Amer >90  >90 mL/min   GFR calc Af Amer >90  >90 mL/min   Comment: (NOTE)     The eGFR has been calculated using the CKD EPI equation.     This calculation has not been validated in all clinical situations.     eGFR's persistently <90 mL/min signify possible Chronic Kidney     Disease.  GLUCOSE, CAPILLARY     Status: Abnormal   Collection Time    02/12/14  4:50 PM      Result Value Ref Range   Glucose-Capillary 317 (*) 70 - 99 mg/dL   Comment 1 Documented in Chart     Comment 2 Notify RN    GLUCOSE, CAPILLARY     Status: Abnormal   Collection Time    02/12/14 11:06 PM      Result Value Ref Range   Glucose-Capillary 240 (*) 70 - 99 mg/dL   Comment 1 Notify RN    GLUCOSE, CAPILLARY     Status: Abnormal   Collection Time    02/13/14  7:26 AM      Result Value Ref Range   Glucose-Capillary 289 (*) 70 - 99 mg/dL   Comment 1 Documented in Chart     Comment 2 Notify RN      Labs are reviewed and are pertinent for .  Current Facility-Administered Medications  Medication Dose Route Frequency Provider Last Rate Last Dose  . acetaminophen (TYLENOL) tablet 650 mg  650 mg Oral Q6H PRN Rise Patience, MD       Or  . acetaminophen (TYLENOL) suppository 650 mg  650 mg Rectal Q6H PRN Rise Patience, MD      . dexamethasone (DECADRON) tablet 4 mg  4 mg Oral 4 times per day Elmarie Shiley, MD   4 mg at 02/13/14 1134  . docusate sodium (COLACE) capsule 100 mg  100 mg Oral BID PRN Rise Patience, MD      . enoxaparin (LOVENOX) injection 40 mg  40 mg Subcutaneous Q24H Rise Patience, MD   40 mg at 02/13/14 1134  . furosemide (LASIX) tablet 20 mg  20 mg Oral Daily Rise Patience, MD   20 mg at 02/13/14 1134  . HYDROmorphone (DILAUDID) injection 2 mg  2 mg Intravenous Q2H PRN Belkys A Regalado, MD   2 mg at 02/13/14 1134  . HYDROmorphone (DILAUDID) tablet 4 mg  4 mg Oral Q4H PRN Belkys A Regalado, MD   4 mg at 02/13/14 0533  . ibuprofen (ADVIL,MOTRIN) tablet 200-400 mg  200-400 mg Oral Q6H PRN Rise Patience, MD   400 mg at 02/13/14 0252  . insulin aspart (novoLOG) injection 0-15 Units  0-15 Units Subcutaneous TID WC Venetia Maxon Rama, MD   5 Units at 02/13/14 [phone]  . insulin aspart (novoLOG) injection 0-5 Units  0-5 Units Subcutaneous QHS Venetia Maxon Rama, MD   2 Units at 02/12/14 2316  . levothyroxine (SYNTHROID, LEVOTHROID) tablet 150 mcg  150 mcg Oral QAC breakfast Venetia Maxon Rama, MD   150 mcg at 02/13/14 0834  . LORazepam (ATIVAN) tablet 1 mg  1 mg Oral Q8H PRN Rise Patience, MD   1 mg at 02/12/14 2254  . morphine (MS CONTIN) 12 hr tablet 60  mg  60 mg Oral Q12H Rise Patience, MD   60 mg at 02/13/14 1134  . ondansetron (ZOFRAN) tablet 4 mg  4 mg Oral Q6H PRN Rise Patience, MD       Or  . ondansetron [facility]) injection 4 mg  4 mg Intravenous Q6H PRN Rise Patience, MD      . pantoprazole (PROTONIX) EC tablet 40 mg  40  mg Oral Daily Rise Patience, MD   40 mg at 02/13/14 1134  . QUEtiapine (SEROQUEL XR) 24 hr tablet 100 mg  100 mg Oral Daily Belkys A Regalado, MD   100 mg at 02/13/14 1134  . traZODone (DESYREL) tablet 100 mg  100 mg Oral QHS PRN Durward Parcel, MD        Psychiatric Specialty Exam: Physical Exam Full physical performed in Emergency Department. I have reviewed this assessment and concur with its findings.   Review of Systems  Musculoskeletal: Positive for back pain, myalgias and neck pain.  Psychiatric/Behavioral: Positive for depression. The patient is nervous/anxious and has insomnia.     Blood pressure 140/81, pulse 88, temperature 98.2 F (36.8 C), temperature source Oral, resp. rate 20, height '6\' 4"'  (1.93 m), weight 85.866 kg (189 lb 4.8 oz), SpO2 98.00%.Body mass index is 23.05 kg/(m^2).  General Appearance: Guarded  Eye Contact::  Good  Speech:  Pressured  Volume:  Increased  Mood:  Anxious, Depressed and Irritable  Affect:  Appropriate and Congruent  Thought Process:  Coherent and Goal Directed  Orientation:  Full (Time, Place, and Person)  Thought Content:  Rumination  Suicidal Thoughts:  No  Homicidal Thoughts:  No  Memory:  Immediate;   Fair  Judgement:  Impaired  Insight:  Fair  Psychomotor Activity:  Restlessness  Concentration:  Fair  Recall:  [employer] of Knowledge:Fair  Language: Good  Akathisia:  NA  Handed:  Right  AIMS (if indicated):     Assets:  Communication Skills Des[employer] for [place] Talents/Skills Transportation  Sleep:      Musculoskeletal: Strength & Muscle Tone: within normal limits Gait & Station: broad based Patient leans: N/A  Treatment Plan Summary: Daily contact with patient to assess and evaluate symptoms and progress in treatment Medication management  Keyarah Mcroy,JANARDHAHA R. 02/13/2014 11:36 AM

## 2014-02-14 DIAGNOSIS — R5383 Other fatigue: Secondary | ICD-10-CM

## 2014-02-14 DIAGNOSIS — F4325 Adjustment disorder with mixed disturbance of emotions and conduct: Secondary | ICD-10-CM

## 2014-02-14 DIAGNOSIS — F411 Generalized anxiety disorder: Secondary | ICD-10-CM | POA: Diagnosis present

## 2014-02-14 DIAGNOSIS — Z515 Encounter for palliative care: Secondary | ICD-10-CM | POA: Diagnosis present

## 2014-02-14 DIAGNOSIS — F39 Unspecified mood [affective] disorder: Secondary | ICD-10-CM

## 2014-02-14 DIAGNOSIS — R5381 Other malaise: Secondary | ICD-10-CM

## 2014-02-14 LAB — GLUCOSE, CAPILLARY
GLUCOSE-CAPILLARY: 240 mg/dL — AB (ref 70–99)
GLUCOSE-CAPILLARY: 368 mg/dL — AB (ref 70–99)
Glucose-Capillary: 272 mg/dL — ABNORMAL HIGH (ref 70–99)
Glucose-Capillary: 198 mg/dL — ABNORMAL HIGH (ref 70–99)

## 2014-02-14 LAB — CREATININE, SERUM
Creatinine, Ser: 0.77 mg/dL (ref 0.50–1.35)
GFR calc Af Amer: >90 mL/min (ref >90)
GFR calc non Af Amer: >90 mL/min (ref >90)

## 2014-02-14 MED ORDER — INSULIN ASPART 100 UNIT/ML ~~LOC~~ SOLN
8.0000 [IU] | Freq: Three times a day (TID) | SUBCUTANEOUS | Status: DC
  Administered 2014-02-14 – 2014-02-15 (×4): 8 [IU] via SUBCUTANEOUS

## 2014-02-14 NOTE — Consult Note (Signed)
Frank Ward   Reason for Ward: psychosis with agitation; insomnia Referring Physician:   Dr. Rockne Menghini Fong Siraj Ward is an 43 y.o. male. 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 Cancer     Thyroid  . Thyroid disease   . Hypertension   . Pneumonia    AXIS IV:  economic problems, occupational problems, other psychosocial or environmental problems, problems related to social environment and problems with primary support group AXIS V:  41-50 serious symptoms  Plan: Case discussed with Frank Ward, staff RN and Frank Messing, LCSW Continue Seroquel 169m Qhs, which can be titrated up to 600 mg a day if clinically required ContinueTrazodone 100 mg PO Qhs /PRN for insomnia Patient does not meet criteria for psychiatric inpatient admission. Supportive therapy provided about ongoing stressors. will provide abilify or haldol which was taken during last admission. Appreciate psychiatric consultation and follow up as clinically required Please contact [phone] if needs further assistance  Subjective:   Frank Ward a 43y.o. male patient admitted with confusion and agitation.  HPI:  Patient is seen along with HSindy Messingfor psychiatric consultation for frequent agitation and medication management. Patient is known to this provider and other treatment team from his previous hospitalization for the same issues. Patient is able to recognize this provider as psychiatrist even before introducing. He states that he is smart and knows his current medical condition and his mood changes. He stated [place] and than become unconscious required to be [facility] Patient is known to non  compliant with his medications and out patient radiation treatment in the past. He wants to be treated while in hospital because of his transportation issues and limited psychosocial support. He has history of heart attack, pneumonia and previously restrained because of agitation and aggression. he was also arrested while in hospital about six months ago. Patient denied current symptoms of psychosis and suicidal or homicidal ideations. He has spontaneous and pressured speech and tangential thought process.   Interval History: Patient is seen for psychiatric consultation follow up. Patient complained of feeling tired, week and stated that he slept much better wwith his current medication trazodone and seroquel at bed time. Patient increased room temperature because he want to sweat and says that is better for him. Patient has lesser pain due to his current medication, has positive attitude towards his current treatment and radiation treatment in near future. Patient is referred to case management regarding possible housing when he gets ready to be discharged. He has no contact numbers to his aunts because his mobile is missed. He has appropriate responses and cooperative with staff regarding his medication and other treatments.   Review of Systems: As presented in the history of presenting illness, rest negative  HPI Elements:   Location:  psychosis and agitation. Quality:  poor'. Severity:  acute. Timing:  multiple psychosocial, medical and non compliance.  Past Psychiatric History: Past Medical History  Diagnosis Date  . Cancer     Thyroid  . Thyroid disease   . Hypertension   . Pneumonia     reports that he quit smoking about 2 years ago. His smoking use included Cigarettes. He smoked 0.00  packs per day for 10 years. He has never used smokeless tobacco. He reports that he drinks about 4.8 ounces of alcohol per week. He reports that he uses illicit drugs (Marijuana). Family History  Problem  Relation Age of Onset  . Diabetes Mother   . Diabetes Brother      Living Arrangements: Other relatives   Abuse/Neglect [facility]) Physical Abuse: Denies Verbal Abuse: Denies Sexual Abuse: Denies Allergies:   Allergies  Allergen Reactions  . Penicillins Anaphylaxis, Hives and Itching  . Latex Hives and Itching  . Morphine And Related Hives, Nausea And Vomiting and Swelling    Swelling in body    ACT Assessment Complete:  NO Objective: Blood pressure 127/61, pulse 99, temperature 98.4 F (36.9 C), temperature source Oral, resp. rate 20, height '6\' 4"'  (1.93 m), weight 85.866 kg (189 lb 4.8 oz), SpO2 99.00%.Body mass index is 23.05 kg/(m^2). Results for orders placed during the hospital encounter of 02/06/14 (from the past 72 hour(s))  AMMONIA     Status: None   Collection Time    02/12/14  4:00 AM      Result Value Ref Range   Ammonia 32  11 - 60 umol/L  HEPATIC FUNCTION PANEL     Status: Abnormal   Collection Time    02/12/14  4:05 AM      Result Value Ref Range   Total Protein 5.2 (*) 6.0 - 8.3 g/dL   Albumin 2.8 (*) 3.5 - 5.2 g/dL   AST 13  0 - 37 U/L   ALT 15  0 - 53 U/L   Alkaline Phosphatase 89  39 - 117 U/L   Total Bilirubin <0.2 (*) 0.3 - 1.2 mg/dL   Bilirubin, Direct <0.2  0.0 - 0.3 mg/dL   Indirect Bilirubin NOT CALCULATED  0.3 - 0.9 mg/dL  BASIC METABOLIC PANEL     Status: Abnormal   Collection Time    02/12/14  4:05 AM      Result Value Ref Range   Sodium 138  137 - 147 mEq/L   Potassium 4.0  3.7 - 5.3 mEq/L   Chloride 101  96 - 112 mEq/L   CO2 25  19 - 32 mEq/L   Glucose, Bld 211 (*) 70 - 99 mg/dL   BUN 13  6 - 23 mg/dL   Creatinine, Ser 0.76  0.50 - 1.35 mg/dL   Calcium 8.8  8.4 - 10.5 mg/dL   GFR calc non Af Amer >90  >90 mL/min   GFR calc Af Amer >90  >90 mL/min   Comment: (NOTE)     The eGFR has been calculated using the CKD EPI equation.     This calculation has not been validated in all clinical situations.     eGFR's persistently <90 mL/min  signify possible Chronic Kidney     Disease.  GLUCOSE, CAPILLARY     Status: Abnormal   Collection Time    02/12/14  4:50 PM      Result Value Ref Range   Glucose-Capillary 317 (*) 70 - 99 mg/dL   Comment 1 Documented in Chart     Comment 2 Notify RN    GLUCOSE, CAPILLARY     Status: Abnormal   Collection Time    02/12/14 11:06 PM      Result Value Ref Range   Glucose-Capillary 240 (*) 70 - 99 mg/dL   Comment 1 Notify RN    GLUCOSE, CAPILLARY     Status: Abnormal   Collection  Time    02/13/14  7:26 AM      Result Value Ref Range   Glucose-Capillary 289 (*) 70 - 99 mg/dL   Comment 1 Documented in Chart     Comment 2 Notify RN    GLUCOSE, CAPILLARY     Status: Abnormal   Collection Time    02/13/14 11:58 AM      Result Value Ref Range   Glucose-Capillary 263 (*) 70 - 99 mg/dL   Comment 1 Documented in Chart     Comment 2 Notify RN    GLUCOSE, CAPILLARY     Status: Abnormal   Collection Time    02/13/14  4:15 PM      Result Value Ref Range   Glucose-Capillary 299 (*) 70 - 99 mg/dL   Comment 1 Documented in Chart     Comment 2 Notify RN    GLUCOSE, CAPILLARY     Status: Abnormal   Collection Time    02/13/14 10:12 PM      Result Value Ref Range   Glucose-Capillary 175 (*) 70 - 99 mg/dL   Comment 1 Notify RN    CREATININE, SERUM     Status: None   Collection Time    02/14/14  4:33 AM      Result Value Ref Range   Creatinine, Ser 0.77  0.50 - 1.35 mg/dL   GFR calc non Af Amer >90  >90 mL/min   GFR calc Af Amer >90  >90 mL/min   Comment: (NOTE)     The eGFR has been calculated using the CKD EPI equation.     This calculation has not been validated in all clinical situations.     eGFR's persistently <90 mL/min signify possible Chronic Kidney     Disease.  GLUCOSE, CAPILLARY     Status: Abnormal   Collection Time    02/14/14  7:56 AM      Result Value Ref Range   Glucose-Capillary 272 (*) 70 - 99 mg/dL   Labs are reviewed and are pertinent for .  Current  Facility-Administered Medications  Medication Dose Route Frequency Provider Last Rate Last Dose  . acetaminophen (TYLENOL) tablet 650 mg  650 mg Oral Q6H PRN Rise Patience, MD       Or  . acetaminophen (TYLENOL) suppository 650 mg  650 mg Rectal Q6H PRN Rise Patience, MD      . dexamethasone (DECADRON) tablet 4 mg  4 mg Oral 4 times per day Elmarie Shiley, MD   4 mg at 02/14/14 0649  . docusate sodium (COLACE) capsule 100 mg  100 mg Oral BID PRN Rise Patience, MD      . enoxaparin (LOVENOX) injection 40 mg  40 mg Subcutaneous Q24H Rise Patience, MD   40 mg at 02/13/14 1134  . furosemide (LASIX) tablet 20 mg  20 mg Oral Daily Rise Patience, MD   20 mg at 02/13/14 1134  . HYDROmorphone (DILAUDID) injection 2 mg  2 mg Intravenous Q2H PRN Belkys A Regalado, MD   2 mg at 02/14/14 0703  . HYDROmorphone (DILAUDID) tablet 4 mg  4 mg Oral Q4H PRN Belkys A Regalado, MD   4 mg at 02/13/14 0533  . ibuprofen (ADVIL,MOTRIN) tablet 200-400 mg  200-400 mg Oral Q6H PRN Rise Patience, MD   400 mg at 02/13/14 0252  . insulin aspart (novoLOG) injection 0-20 Units  0-20 Units Subcutaneous TID WC Venetia Maxon Rama, MD  11 Units at 02/14/14 0849  . insulin aspart (novoLOG) injection 0-5 Units  0-5 Units Subcutaneous QHS Christina P Rama, MD      . insulin aspart (novoLOG) injection 8 Units  8 Units Subcutaneous TID WC Venetia Maxon Rama, MD   8 Units at 02/14/14 0848  . levothyroxine (SYNTHROID, LEVOTHROID) tablet 150 mcg  150 mcg Oral QAC breakfast Venetia Maxon Rama, MD   150 mcg at 02/14/14 0848  . LORazepam (ATIVAN) tablet 1 mg  1 mg Oral Q8H PRN Rise Patience, MD   1 mg at 02/12/14 2254  . morphine (MS CONTIN) 12 hr tablet 60 mg  60 mg Oral Q12H Rise Patience, MD   60 mg at 02/13/14 2138  . ondansetron (ZOFRAN) tablet 4 mg  4 mg Oral Q6H PRN Rise Patience, MD       Or  . ondansetron [facility] injection 4 mg  4 mg Intravenous Q6H PRN Rise Patience, MD       . pantoprazole (PROTONIX) EC tablet 40 mg  40 mg Oral Daily Rise Patience, MD   40 mg at 02/13/14 1134  . QUEtiapine (SEROQUEL XR) 24 hr tablet 100 mg  100 mg Oral Daily Belkys A Regalado, MD   100 mg at 02/13/14 1134  . traZODone (DESYREL) tablet 100 mg  100 mg Oral QHS PRN Durward Parcel, MD   100 mg at 02/13/14 2314    Psychiatric Specialty Exam: Physical Exam Full physical performed in Emergency Department. I have reviewed this assessment and concur with its findings.   Review of Systems  Musculoskeletal: Positive for back pain, myalgias and neck pain.  Psychiatric/Behavioral: Positive for depression. The patient is nervous/anxious and has insomnia.     Blood pressure 127/61, pulse 99, temperature 98.4 F (36.9 C), temperature source Oral, resp. rate 20, height '6\' 4"'  (1.93 m), weight 85.866 kg (189 lb 4.8 oz), SpO2 99.00%.Body mass index is 23.05 kg/(m^2).  General Appearance: Guarded  Eye Contact::  Good  Speech:  Pressured  Volume:  Increased  Mood:  Anxious, Depressed and Irritable  Affect:  Appropriate and Congruent  Thought Process:  Coherent and Goal Directed  Orientation:  Full (Time, Place, and Person)  Thought Content:  Rumination  Suicidal Thoughts:  No  Homicidal Thoughts:  No  Memory:  Immediate;   Fair  Judgement:  Impaired  Insight:  Fair  Psychomotor Activity:  Restlessness  Concentration:  Fair  Recall:  [employer] of Knowledge:Fair  Language: Good  Akathisia:  NA  Handed:  Right  AIMS (if indicated):     Assets:  Communication Skills Desire for [place] Talents/Skills Transportation  Sleep:      Musculoskeletal: Strength & Muscle Tone: within normal limits Gait & Station: broad based Patient leans: N/A  Treatment Plan Summary: Daily contact with patient to assess and evaluate symptoms and progress in treatment Medication management  Cari Burgo,JANARDHAHA R. 02/14/2014 10:56 AM

## 2014-02-14 NOTE — Consult Note (Signed)
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 Progressive disease with limited treatment options. Complicated by multiple psycho-social issues.  Consult is for review of medical treatment options, clarification of goals of care, disposition and options, and symptom recommendation.  This NP Wadie Lessen reviewed medical records, received report from team, assessed the patient and then meet at the patient's bedside  to discuss diagnosis prognosis, GOC, EOL wishes disposition and options.  A  discussion was had today regarding  his complicated pain management and social situation.   I am very familiar with this patient from previous admissions and the issues regarding underlying psychiatric diagnosis, unknown viable living situation and no primary care provider to  collaborate with once discharged still exist.  Patient has been non-compliant with medical recommendations and treatment plan in the past.   I have discussed this patient with SW and will continue to work and support from a Palliative Medicine  perspective.  Goals of Care: 1.  Code Status:DNR/DNI   2. Scope of Treatment:  Patient is open to all available and offered medical interventions to prolong life at this time.   Pain control is a priority.  3. Disposition:    Pending; patient is unable to tell me a person or location of his discharge plan.  At this time he  is is not eligible for an in-patient hospice facility.   4. Symptom Management:   1. Anxiety/Agitation: Ativan 1 mg po every 8 hrs prn                                       Seroquel XR 100 mg daily per psychiatry   2. Pain: MS  Contin 60 mg every 12 hrs prn           Currently being managed with both Po/IV Dilaudid prn           Recommend attempt to control patient's pain with oral agents.   He refuses to try titration of ER forms of  MS Contin.  "nothing works but IV Dilaudid"  Hopefully radiation will decrease pain medication needs.  Has refused Biphosphonates   This is a complicated situation.  It does not seem that there is a discharge option that would allow patient to be discharged on any type of IV infusion for pain control.  He does not have a provider (PCP) in the community to manage his pain and associated medications.   Last discharge resulted in an OD of some sort requiring hospital admission (sitution unclear).  Patein's past  behavior issues and con-compliance have further limited his options.  3. Insomnia: Trazodone 100 mg po qhs prn per psychiatry  4. Bowel Regimen: Colace   5. Psychosocial:  Emotional support offered to patient   6. Spiritual: Chaplain support offered   Brief HPI:   43 y.o. 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   ROS: weakness, back and hip pain   PMH:  Past Medical History  Diagnosis Date  . Cancer     Thyroid  . Thyroid disease   . Hypertension   . Pneumonia      PSH: Past Surgical History  Procedure Laterality Date  . Tendon repair    . Thyroidectomy     I have reviewed the [place] and SH and  If appropriate update it with new information. Allergies  Allergen Reactions  . Penicillins Anaphylaxis, Hives and Itching  . Latex Hives and  Itching  . Morphine And Related Hives, Nausea And Vomiting and Swelling    Swelling in body   Scheduled Meds: . dexamethasone  4 mg Oral 4 times per day  . enoxaparin (LOVENOX) injection  40 mg Subcutaneous Q24H  . furosemide  20 mg Oral Daily  . insulin aspart  0-20 Units Subcutaneous TID WC  . insulin aspart  0-5 Units Subcutaneous QHS  . insulin aspart  8 Units Subcutaneous TID WC  . levothyroxine  150 mcg Oral QAC breakfast  . morphine  60 mg Oral Q12H  . pantoprazole  40 mg Oral Daily  . QUEtiapine  100 mg Oral Daily   Continuous Infusions:  PRN Meds:.acetaminophen, acetaminophen, docusate sodium, HYDROmorphone (DILAUDID) injection, HYDROmorphone, ibuprofen, LORazepam, ondansetron (ZOFRAN) IV, ondansetron, traZODone    BP 127/61  Pulse 99  Temp(Src) 98.4 F (36.9 C) (Oral)  Resp 20  Ht 6\' 4"  (1.93 m)  Wt 85.866 kg (189 lb 4.8 oz)  BMI 23.05 kg/m2  SpO2 99%   PPS: 70 %   Intake/Output Summary (Last 24 hours) at 02/14/14 1120 Last data filed at 02/14/14 0824  Gross per 24 hour  Intake   1200 ml  Output      0 ml  Net   1200 ml     Physical Exam:  General: chronically ill appearing, appears weaker than I meet his last hospitalization HEENT:  moist buccal membranes, no exudate Chest:  CTA  CVS:  RRR Abdomen: soft NT +BS Ext:  BLE with trace edema, feet self wrapped in cloth dressings Neuro: oriented X3 Psych: Patient's insight and judgement surrounding his illness is poor  Labs: CBC    Component Value Date/Time   WBC 7.7 02/08/2014 0440   RBC 3.97* 02/08/2014 0440   HGB 12.0* 02/08/2014 0440   HCT 36.5* 02/08/2014 0440   PLT 417* 02/08/2014 0440   MCV 91.9 02/08/2014 0440   MCH 30.2 02/08/2014 0440   MCHC 32.9 02/08/2014 0440   RDW 14.1 02/08/2014 0440   LYMPHSABS 0.3* 02/07/2014 0640   MONOABS 0.1 02/07/2014 0640   EOSABS 0.0 02/07/2014 0640   BASOSABS 0.0 02/07/2014 0640    BMET    Component Value Date/Time   NA 138 02/12/2014 0405   K 4.0 02/12/2014  0405   CL 101 02/12/2014 0405   CO2 25 02/12/2014 0405   GLUCOSE 211* 02/12/2014 0405   BUN 13 02/12/2014 0405   CREATININE 0.77 02/14/2014 0433   CALCIUM 8.8 02/12/2014 0405   GFRNONAA >90 02/14/2014 0433   GFRAA >90 02/14/2014 0433    CMP     Component Value Date/Time   NA 138 02/12/2014 0405   K 4.0 02/12/2014 0405   CL 101 02/12/2014 0405  CO2 25 02/12/2014 0405   GLUCOSE 211* 02/12/2014 0405   BUN 13 02/12/2014 0405   CREATININE 0.77 02/14/2014 0433   CALCIUM 8.8 02/12/2014 0405   PROT 5.2* 02/12/2014 0405   ALBUMIN 2.8* 02/12/2014 0405   AST 13 02/12/2014 0405   ALT 15 02/12/2014 0405   ALKPHOS 89 02/12/2014 0405   BILITOT <0.2* 02/12/2014 0405   GFRNONAA >90 02/14/2014 0433   GFRAA >90 02/14/2014 0433    Time In Time Out Total Time Spent with Patient Total Overall Time  0800  0915  70 min 75 min    Greater than 50%  of this time was spent counseling and coordinating care related to the above assessment and plan.   Wadie Lessen NP  Palliative Medicine Team Team Phone # [phone] Pager [phone]  Discussed with Dr Rockne Menghini

## 2014-02-14 NOTE — Progress Notes (Signed)
Clinical Social Work  CSW went to room to meet with patient. Patient sleeping and reports he is in pain and does not want to talk. CSW will follow up at later time.  [place], [place] [phone]

## 2014-02-14 NOTE — Progress Notes (Signed)
Pt was lying in bed when I arrived. He said he felt ok today and shared he was getting new meds. Pt said he was tired/sleepy. Visit was short but appreciated.  Ernest Haber Chaplain  02/14/14 1400  Clinical Encounter Type  Visited With Patient

## 2014-02-14 NOTE — Progress Notes (Signed)
Progress Note   Frank Ward HYW:[id number] DOB: 1971-03-09 DOA: 02/06/2014 PCP: No PCP Per Patient   Brief Narrative:   Frank Ward is an 43 y.o. 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  Assessment/Plan:   Principal Problem:   Acute encephalopathy  Multifactorial with possible drug overdose responsible for his initial unresponsiveness.  CT of the head negative for acute changes except for his known metastatic disease.  Resolved, patient at baseline mental status.  Active Problems:   Steroid-induced hyperglycemia  CBGs 175-299, on insulin resistant SSI with 6 units of NovoLog for meals coverage. Increase meal coverage to 8 units.    Hypothyroidism status post thyroidectomy  Continue Synthroid. Goal TSH less than 1.0. Most recent TSH 5.80, down from 27.12 approximately one month ago.  Increase Synthroid to 150 mcg as recommended by Dr. Dwyane Dee of endocrinology 01/20/14.    Psychotic disorder NOS  Lack capacity for medical decision-making. Aggressive behavior appears to be centered around obtaining pain medications, however he does have known metastatic cancer with bone involvement, receiving active radiation treatment to sacrum and T12-L2. Has known brain metastasis.  Continue Seroquel, trazodone, and as needed Ativan.  Psychiatrically stable at present.    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 Currently receiving radiation therapy.  Continue neck collar per prior neurosurgery recommendations.  Continue oral Decadron.  Continue pain management efforts with Dilaudid, Ibuprofen.  Continue Synthroid.  Palliative care consultation requested for assistance with pain management.    Ulcer of left dorsal foot  Wound care per wound care RN recommendations.    DVT Prophylaxis  Continue Lovenox.  Code Status: Full. Family Communication: No family at the bedside. Disposition Plan: Home when stable.   IV Access:    Peripheral IV   Procedures:    Right lower extremity venous Doppler 02/08/14: No DVT or SVT in the right lower extremity.   Medical Consultants:    Dr. Rhea Pink, Palliative care  Dr. Eppie Gibson, Radiation oncology  Dr. Durward Parcel, Psychiatry   Other Consultants:    None.   Anti-Infectives:    None.  Subjective:   Frank Ward remains calm and cooperative. He still has back pain and is receiving Dilaudid-HP every 2 hours for pain control. Had a bowel movement today. No nausea or vomiting.  Objective:    Filed Vitals:   02/13/14 0643 02/13/14 1610 02/13/14 2209 02/14/14 0559  BP: 140/81 119/58 129/74 127/61  Pulse: 88 95 86 99  Temp: 98.2 F (36.8 C) 98.6 F (37 C) 97.9 F (36.6 C) 98.4 F (36.9 C)  TempSrc: Oral Oral Oral Oral  Resp: 20 18 20 20   Height:      Weight:      SpO2: 98% 98% 97% 99%    Intake/Output Summary (Last 24 hours) at 02/14/14 0749 Last data filed at 02/14/14 0559  Gross per 24 hour  Intake   1320 ml  Output  0 ml  Net   1320 ml    Exam: Gen:  NAD Cardiovascular:  RRR, No M/R/G Respiratory:  Lungs CTAB Gastrointestinal:  Abdomen softly distended, + BS Extremities:  1-2+ edema   Data Reviewed:    Labs: Basic Metabolic Panel:  Recent Labs Lab 02/08/14 0440 02/10/14 0500 02/12/14 0405 02/14/14 0433  NA 133*  139 138  --   K 4.2 4.0 4.0  --   CL 96 102 101  --   CO2 25 24 25   --   GLUCOSE 147* 236* 211*  --   BUN 10 11 13   --   CREATININE 1.15 0.91 0.76 0.77  CALCIUM 10.4 8.9 8.8  --    GFR Estimated Creatinine Clearance: 141.7 ml/min (by C-G formula based on Cr of 0.77). Liver Function Tests:  Recent Labs Lab 02/12/14 0405  AST 13  ALT 15  ALKPHOS 89  BILITOT <0.2*  PROT 5.2*  ALBUMIN 2.8*    Recent Labs Lab 02/12/14 0400  AMMONIA 32   CBC:  Recent Labs Lab 02/08/14 0440  WBC 7.7  HGB 12.0*  HCT 36.5*  MCV 91.9  PLT 417*   CBG:  Recent Labs Lab 02/12/14 2306 02/13/14 0726 02/13/14 1158 02/13/14 1615 02/13/14 2212  GLUCAP 240* 289* 263* 299* 175*     Radiographs/Studies:   Dg Chest 2 View  02/06/2014   CLINICAL DATA:  Altered mental status.  EXAM: CHEST  2 VIEW  COMPARISON:  12/09/2013  FINDINGS: Normal heart size and pulmonary vascularity. Multiple diffuse metastases throughout the lungs, similar to prior study. No developing airspace disease or consolidation. No blunting of costophrenic angles. No pneumothorax. Destructive bone lesion in the right scapula is probably metastatic.  IMPRESSION: Diffuse bilateral pulmonary metastases. Destructive bone metastasis in the right scapula.   Electronically Signed   By: Lucienne Capers M.D.   On: 02/06/2014 23:03   Ct Head Wo Contrast  02/06/2014   CLINICAL DATA:  Headaches and confusion.  EXAM: CT HEAD WITHOUT CONTRAST  TECHNIQUE: Contiguous axial images were obtained from the base of the skull through the vertex without intravenous contrast.  COMPARISON:  CT head 01/16/2013.  MRI brain 12/26/2013.  FINDINGS: 1.3 cm diameter mass lesion in the fourth ventricle consistent with metastatic disease is enlarged since previous CT scan but appears stable since the prior MRI. Tiny additional nodular lesions demonstrated on the MRI are not appreciable on CT. Ventricles and sulci appear otherwise symmetrical. No significant  ventricular dilatation. No abnormal extra-axial fluid collections. Gray-white matter junctions are distinct. Basal cisterns are not effaced. No evidence of acute intracranial hemorrhage. Calvarium appears intact. Opacification of the right frontal sinus with mucosal thickening in the maxillary antra bilaterally. Mastoid air cells are not opacified.  IMPRESSION: Re identification of known mass in the fourth ventricle. No acute intracranial abnormalities are suspected. Chronic inflammatory changes in the sinuses.   Electronically Signed   By: Lucienne Capers M.D.   On: 02/06/2014 23:38    Medications:   . dexamethasone  4 mg Oral 4 times per day  . enoxaparin (LOVENOX) injection  40 mg Subcutaneous Q24H  . furosemide  20 mg Oral Daily  . insulin aspart  0-20 Units Subcutaneous TID WC  . insulin aspart  0-5 Units Subcutaneous QHS  . insulin aspart  6 Units Subcutaneous TID WC  . levothyroxine  150 mcg Oral QAC breakfast  . morphine  60 mg Oral Q12H  . pantoprazole  40 mg Oral Daily  .  QUEtiapine  100 mg Oral Daily   Continuous Infusions:   Time spent:  25 minutes.    LOS: 8 days   [place] Pager [phone]. If unable to reach me by pager, please call my cell phone at [phone].  *Please refer to [email or web address], password [email or web address] to get updated schedule on who will round on this patient, as hospitalists switch teams weekly. If 7PM-7AM, please contact night-coverage at [email or web address], password [email or web address] for any overnight needs.  02/14/2014, 7:49 AM    **Disclaimer: This note was dictated with voice recognition software. Similar sounding words can inadvertently be transcribed and this note may contain transcription errors which may not have been corrected upon publication of note.**

## 2014-02-15 LAB — GLUCOSE, CAPILLARY
GLUCOSE-CAPILLARY: 288 mg/dL — AB (ref 70–99)
Glucose-Capillary: 222 mg/dL — ABNORMAL HIGH (ref 70–99)
Glucose-Capillary: 184 mg/dL — ABNORMAL HIGH (ref 70–99)
Glucose-Capillary: 138 mg/dL — ABNORMAL HIGH (ref 70–99)
Glucose-Capillary: 162 mg/dL — ABNORMAL HIGH (ref 70–99)

## 2014-02-15 MED ORDER — INSULIN GLARGINE 100 UNIT/ML ~~LOC~~ SOLN
20.0000 [IU] | Freq: Every day | SUBCUTANEOUS | Status: DC
  Administered 2014-02-15 – 2014-02-16 (×2): 20 [IU] via SUBCUTANEOUS
  Filled 2014-02-15 (×2): qty 0.2

## 2014-02-15 NOTE — Progress Notes (Signed)
Progress Note   Frank Ward IOX:[id number] DOB: [birth year] DOA: 02/06/2014 PCP: No PCP Per Patient   Brief Narrative:   Frank Ward is an 43 y.o. 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  Assessment/Plan:   Principal Problem:   Acute encephalopathy  Multifactorial with possible drug overdose responsible for his initial unresponsiveness.  CT of the head negative for acute changes except for his known metastatic disease.  Resolved, patient at baseline mental status.  Active Problems:   Steroid-induced hyperglycemia  CBGs 198-368, on insulin resistant SSI with 8 units of NovoLog for meals coverage. Add 20 units of Lantus daily.  Patient now refusing Decadron for fear it is causing his blood sugar elevation.  Check hemoglobin A1c.    Hypothyroidism status post thyroidectomy  Continue Synthroid. Goal TSH less than 1.0. Most recent TSH 5.80, down from 27.12 approximately one month ago.  Increase Synthroid to 150 mcg as recommended by Dr. Dwyane Dee of endocrinology 01/20/14.    Psychotic disorder NOS  Lack capacity for medical decision-making. Aggressive behavior appears to be centered around obtaining pain medications, however he does have known metastatic cancer with bone involvement, receiving active radiation treatment to sacrum and T12-L2. Has known brain metastasis.  Continue Seroquel, trazodone, and as needed Ativan.  Psychiatrically stable at present.    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 Currently receiving radiation therapy.  Continue neck collar per prior neurosurgery recommendations.  Continue oral Decadron.  Continue pain management efforts with Dilaudid, Ibuprofen.  Continue Synthroid.  Palliative care consultation requested for assistance with pain management.    Ulcer of left dorsal foot  Wound care per wound care RN recommendations.    DVT Prophylaxis  Continue Lovenox.  Code Status: Full. Family Communication: No family at the bedside. Disposition Plan: Home when stable.   IV Access:    Peripheral IV   Procedures:    Right lower extremity venous Doppler 02/08/14: No DVT or SVT in the right lower extremity.   Medical Consultants:    Dr. Rhea Pink, Palliative care  Dr. Eppie Gibson, Radiation oncology  Dr. Durward Parcel, Psychiatry   Other Consultants:    None.   Anti-Infectives:    None.  Subjective:   Frank Ward remains calm and cooperative, although he is expressing some irritation with the nursing staff today. He still has back pain and is receiving Dilaudid-HP every 2 hours for pain control. Bowels are moving. No nausea or vomiting.  Objective:    Filed Vitals:   02/14/14 1400 02/14/14 1432 02/14/14 2143 02/15/14 0605  BP: 124/73 135/73 120/61 122/65  Pulse: 79 86 92 89  Temp: 97.7 F (36.5 C) 98.2 F (36.8 C) 97.6 F (36.4 C) 98.2 F (36.8 C)  TempSrc: Oral Oral Oral Oral  Resp: 18 18 20 18   Height:      Weight:      SpO2: 96% 97% 100% 100%    Intake/Output Summary (Last 24 hours)  at 02/15/14 1125 Last data filed at 02/15/14 0605  Gross per 24 hour  Intake    900 ml  Output      0 ml  Net    900 ml    Exam: Gen:  NAD Cardiovascular:  RRR, No M/R/G Respiratory:  Lungs CTAB Gastrointestinal:  Abdomen softly distended,  + BS Extremities:  1-2+ edema   Data Reviewed:    Labs: Basic Metabolic Panel:  Recent Labs Lab 02/10/14 0500 02/12/14 0405 02/14/14 0433  NA 139 138  --   K 4.0 4.0  --   CL 102 101  --   CO2 24 25  --   GLUCOSE 236* 211*  --   BUN 11 13  --   CREATININE 0.91 0.76 0.77  CALCIUM 8.9 8.8  --    GFR Estimated Creatinine Clearance: 141.7 ml/min (by C-G formula based on Cr of 0.77). Liver Function Tests:  Recent Labs Lab 02/12/14 0405  AST 13  ALT 15  ALKPHOS 89  BILITOT <0.2*  PROT 5.2*  ALBUMIN 2.8*    Recent Labs Lab 02/12/14 0400  AMMONIA 32   CBC: No results found for this basename: WBC, NEUTROABS, HGB, HCT, MCV, PLT,  in the last 168 hours CBG:  Recent Labs Lab 02/14/14 1209 02/14/14 1708 02/14/14 2141 02/15/14 0137 02/15/14 0759  GLUCAP 240* 368* 198* 222* 288*     Radiographs/Studies:   Dg Chest 2 View  02/06/2014   CLINICAL DATA:  Altered mental status.  EXAM: CHEST  2 VIEW  COMPARISON:  12/09/2013  FINDINGS: Normal heart size and pulmonary vascularity. Multiple diffuse metastases throughout the lungs, similar to prior study. No developing airspace disease or consolidation. No blunting of costophrenic angles. No pneumothorax. Destructive bone lesion in the right scapula is probably metastatic.  IMPRESSION: Diffuse bilateral pulmonary metastases. Destructive bone metastasis in the right scapula.   Electronically Signed   By: Lucienne Capers M.D.   On: 02/06/2014 23:03   Ct Head Wo Contrast  02/06/2014   CLINICAL DATA:  Headaches and confusion.  EXAM: CT HEAD WITHOUT CONTRAST  TECHNIQUE: Contiguous axial images were obtained from the base of the skull through the vertex without intravenous contrast.  COMPARISON:  CT head 01/16/2013.  MRI brain 12/26/2013.  FINDINGS: 1.3 cm diameter mass lesion in the fourth ventricle consistent with metastatic disease is enlarged since previous CT scan but appears stable since the prior MRI. Tiny additional nodular  lesions demonstrated on the MRI are not appreciable on CT. Ventricles and sulci appear otherwise symmetrical. No significant ventricular dilatation. No abnormal extra-axial fluid collections. Gray-white matter junctions are distinct. Basal cisterns are not effaced. No evidence of acute intracranial hemorrhage. Calvarium appears intact. Opacification of the right frontal sinus with mucosal thickening in the maxillary antra bilaterally. Mastoid air cells are not opacified.  IMPRESSION: Re identification of known mass in the fourth ventricle. No acute intracranial abnormalities are suspected. Chronic inflammatory changes in the sinuses.   Electronically Signed   By: Lucienne Capers M.D.   On: 02/06/2014 23:38    Medications:   . dexamethasone  4 mg Oral 4 times per day  . enoxaparin (LOVENOX) injection  40 mg Subcutaneous Q24H  . furosemide  20 mg Oral Daily  . insulin aspart  0-20 Units Subcutaneous TID WC  . insulin aspart  0-5 Units Subcutaneous QHS  . insulin aspart  8 Units Subcutaneous TID WC  . levothyroxine  150 mcg Oral QAC breakfast  . morphine  60 mg Oral Q12H  . pantoprazole  40 mg Oral Daily  . QUEtiapine  100 mg Oral Daily   Continuous Infusions:   Time spent:  25 minutes.    LOS: 9 days   [place] Pager [phone]. If unable to reach me by pager, please call my cell phone at [phone].  *Please refer to [email or web address], password [email or web address] to get updated schedule on who will round on this patient, as hospitalists switch teams weekly. If 7PM-7AM, please contact night-coverage at [email or web address], password [email or web address] for any overnight needs.  02/15/2014, 11:25 AM    **Disclaimer: This note was dictated with voice recognition software. Similar sounding words can inadvertently be transcribed and this note may contain transcription errors which may not have been corrected upon publication of note.**

## 2014-02-16 DIAGNOSIS — Z91199 Patient's noncompliance with other medical treatment and regimen due to unspecified reason: Secondary | ICD-10-CM

## 2014-02-16 DIAGNOSIS — Z9119 Patient's noncompliance with other medical treatment and regimen: Secondary | ICD-10-CM

## 2014-02-16 LAB — BASIC METABOLIC PANEL
Anion gap: 13 (ref 5–15)
BUN: 16 mg/dL (ref 6–23)
CALCIUM: 8.5 mg/dL (ref 8.4–10.5)
CO2: 24 mEq/L (ref 19–32)
CREATININE: 0.75 mg/dL (ref 0.50–1.35)
Chloride: 103 mEq/L (ref 96–112)
GFR calc Af Amer: >90 mL/min (ref >90)
GLUCOSE: 110 mg/dL — AB (ref 70–99)
Potassium: 3.9 mEq/L (ref 3.7–5.3)
Sodium: 140 mEq/L (ref 137–147)

## 2014-02-16 LAB — HEMOGLOBIN A1C
Hgb A1c MFr Bld: 6.5 % — ABNORMAL HIGH (ref <5.7)
Mean Plasma Glucose: 140 mg/dL — ABNORMAL HIGH (ref <117)

## 2014-02-16 LAB — GLUCOSE, CAPILLARY
GLUCOSE-CAPILLARY: 101 mg/dL — AB (ref 70–99)
GLUCOSE-CAPILLARY: 140 mg/dL — AB (ref 70–99)
Glucose-Capillary: 162 mg/dL — ABNORMAL HIGH (ref 70–99)
Glucose-Capillary: 139 mg/dL — ABNORMAL HIGH (ref 70–99)

## 2014-02-16 MED ORDER — INSULIN ASPART 100 UNIT/ML ~~LOC~~ SOLN
10.0000 [IU] | Freq: Three times a day (TID) | SUBCUTANEOUS | Status: DC
  Administered 2014-02-16: 10 [IU] via SUBCUTANEOUS

## 2014-02-16 NOTE — Progress Notes (Signed)
Progress Note   Frank Ward SNK:[id number] DOB: 1970-11-06 DOA: 02/06/2014 PCP: No PCP Per Patient   Brief Narrative:   Frank Ward is an 43 y.o. 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  Assessment/Plan:   Principal Problem:   Acute encephalopathy  Multifactorial with possible drug overdose responsible for his initial unresponsiveness.  CT of the head negative for acute changes except for his known metastatic disease.  Resolved, patient at baseline mental status.  Active Problems:   Noncompliance with medications  Nursing staff reports that he is refusing some of his medications, notably Decadron. He does seem a bit more paranoid as well. He also refused his meal coverage dose of insulin last night, making it difficult to titrate his insulin.    Steroid-induced hyperglycemia  CBGs 138-288, on insulin resistant SSI with 8 units of NovoLog for meals coverage and 20 units of Lantus daily.  Increase meal coverage to 10 units.  Patient now refusing Decadron for fear it is causing his blood sugar elevation.  F/U hemoglobin A1c.    Hypothyroidism status post thyroidectomy  Continue Synthroid. Goal TSH less than 1.0. Most recent TSH 5.80, down from 27.12 approximately one month ago.  Increased Synthroid to 150 mcg as recommended by Dr. Dwyane Dee of endocrinology 01/20/14.    Psychotic disorder NOS  Lack capacity for medical decision-making. Aggressive behavior appears to be centered around obtaining pain medications, however he does have  known metastatic cancer with bone involvement, receiving active radiation treatment to sacrum and T12-L2. Has known brain metastasis.  Continue Seroquel, trazodone, and as needed Ativan.  Psychiatrically calm, but starting to display some paranoia.    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 Currently receiving radiation therapy.  Continue neck collar per prior neurosurgery recommendations.  Continue oral Decadron.  Continue pain management efforts with Dilaudid, Ibuprofen.  Continue Synthroid.  Palliative care consultation requested for assistance with pain management.    Ulcer of left dorsal foot  Wound care per wound care RN recommendations.    DVT Prophylaxis  Continue Lovenox.  Code Status: Full. Family Communication: No family at the bedside. Disposition Plan: Home when stable.   IV Access:    Peripheral IV   Procedures:    Right lower extremity venous Doppler 02/08/14: No DVT or SVT in the right lower extremity.   Medical Consultants:    Dr. Rhea Pink, Palliative care  Dr. Eppie Gibson, Radiation oncology  Dr. Durward Parcel, Psychiatry   Other Consultants:    None.   Anti-Infectives:    None.  Subjective:   Frank Ward is calm, but is beginning to display some paranoia. He is writing down the names of the nurses and seems to think that the nursing staff are treating him poorly. He specifically relates that a nurse would not give him phone charger at the nurses station, and feels that this was unwarranted. He is also refusing Decadron and insulin at times.  He continues  to vent   Objective:    Filed Vitals:   02/15/14 0605 02/15/14 1504 02/15/14 2132 02/16/14 0441  BP: 122/65 119/62 123/68 115/61  Pulse: 89 101 110 99  Temp: 98.2  F (36.8 C) 98.4 F (36.9 C) 97.9 F (36.6 C) 97.6 F (36.4 C)  TempSrc: Oral Oral Oral Oral  Resp: 18 18 18 18   Height:      Weight:      SpO2: 100% 98% 98% 96%    Intake/Output Summary (Last 24 hours) at 02/16/14 0733 Last data filed at 02/16/14 0441  Gross per 24 hour  Intake    800 ml  Output      0 ml  Net    800 ml    Exam: Gen:  NAD Cardiovascular:  RRR, No M/R/G Respiratory:  Lungs CTAB Gastrointestinal:  Abdomen softly distended, + BS Extremities:  1-2+ edema   Data Reviewed:    Labs: Basic Metabolic Panel:  Recent Labs Lab 02/10/14 0500 02/12/14 0405 02/14/14 0433 02/16/14 0425  NA 139 138  --  140  K 4.0 4.0  --  3.9  CL 102 101  --  103  CO2 24 25  --  24  GLUCOSE 236* 211*  --  110*  BUN 11 13  --  16  CREATININE 0.91 0.76 0.77 0.75  CALCIUM 8.9 8.8  --  8.5   GFR Estimated Creatinine Clearance: 141.7 ml/min (by C-G formula based on Cr of 0.75). Liver Function Tests:  Recent Labs Lab 02/12/14 0405  AST 13  ALT 15  ALKPHOS 89  BILITOT <0.2*  PROT 5.2*  ALBUMIN 2.8*    Recent Labs Lab 02/12/14 0400  AMMONIA 32   CBC: No results found for this basename: WBC, NEUTROABS, HGB, HCT, MCV, PLT,  in the last 168 hours CBG:  Recent Labs Lab 02/15/14 0137 02/15/14 0759 02/15/14 1234 02/15/14 1742 02/15/14 2134  GLUCAP 222* 288* 184* 138* 162*     Radiographs/Studies:   Dg Chest 2 View  02/06/2014   CLINICAL DATA:  Altered mental status.  EXAM: CHEST  2 VIEW  COMPARISON:  12/09/2013  FINDINGS: Normal heart size and pulmonary vascularity. Multiple diffuse metastases throughout the lungs, similar to prior study. No developing airspace disease or consolidation. No blunting of costophrenic angles. No pneumothorax. Destructive bone lesion in the right scapula is probably metastatic.  IMPRESSION: Diffuse bilateral pulmonary metastases. Destructive bone metastasis in the right scapula.   Electronically Signed   By: Lucienne Capers M.D.    On: 02/06/2014 23:03   Ct Head Wo Contrast  02/06/2014   CLINICAL DATA:  Headaches and confusion.  EXAM: CT HEAD WITHOUT CONTRAST  TECHNIQUE: Contiguous axial images were obtained from the base of the skull through the vertex without intravenous contrast.  COMPARISON:  CT head 01/16/2013.  MRI brain 12/26/2013.  FINDINGS: 1.3 cm diameter mass lesion in the fourth ventricle consistent with metastatic disease is enlarged since previous CT scan but appears stable since the prior MRI. Tiny additional nodular lesions demonstrated on the MRI are not appreciable on CT. Ventricles and sulci appear otherwise symmetrical. No significant ventricular dilatation. No abnormal extra-axial fluid collections. Gray-white matter junctions are distinct. Basal cisterns are not effaced. No evidence of acute intracranial hemorrhage. Calvarium appears intact. Opacification of the right frontal sinus with mucosal thickening in the maxillary antra bilaterally. Mastoid air cells are not opacified.  IMPRESSION: Re identification of known mass in the fourth ventricle. No acute intracranial abnormalities are suspected.  Chronic inflammatory changes in the sinuses.   Electronically Signed   By: Lucienne Capers M.D.   On: 02/06/2014 23:38    Medications:   . dexamethasone  4 mg Oral 4 times per day  . enoxaparin (LOVENOX) injection  40 mg Subcutaneous Q24H  . furosemide  20 mg Oral Daily  . insulin aspart  0-20 Units Subcutaneous TID WC  . insulin aspart  0-5 Units Subcutaneous QHS  . insulin aspart  8 Units Subcutaneous TID WC  . insulin glargine  20 Units Subcutaneous Daily  . levothyroxine  150 mcg Oral QAC breakfast  . morphine  60 mg Oral Q12H  . pantoprazole  40 mg Oral Daily  . QUEtiapine  100 mg Oral Daily   Continuous Infusions:   Time spent:  25 minutes.    LOS: 10 days   [place] [facility] Pager [phone]. If unable to reach me by pager, please call my cell phone at [phone].  *Please refer  to [email or web address], password [email or web address] to get updated schedule on who will round on this patient, as hospitalists switch teams weekly. If 7PM-7AM, please contact night-coverage at [email or web address], password [email or web address] for any overnight needs.  02/16/2014, 7:33 AM    **Disclaimer: This note was dictated with voice recognition software. Similar sounding words can inadvertently be transcribed and this note may contain transcription errors which may not have been corrected upon publication of note.**

## 2014-02-16 NOTE — Consult Note (Signed)
I have reviewed and discussed the care of this patient in detail with the nurse practitioner including pertinent patient records, physical exam findings and data. I agree with details of this encounter.  

## 2014-02-17 ENCOUNTER — Ambulatory Visit
Admit: 2014-02-17 | Discharge: 2014-02-17 | Disposition: A | Discharge status: Home / Self Care | Payer: Medicaid Other | Attending: Radiation Oncology | Admitting: Radiation Oncology

## 2014-02-17 ENCOUNTER — Ambulatory Visit: Payer: Medicaid Other

## 2014-02-17 ENCOUNTER — Ambulatory Visit
Admission: RE | Admit: 2014-02-17 | Discharge: 2014-02-17 | Disposition: A | Discharge status: Home / Self Care | Payer: Medicaid Other | Source: Ambulatory Visit | Attending: Radiation Oncology | Admitting: Radiation Oncology

## 2014-02-17 DIAGNOSIS — IMO0002 Reserved for concepts with insufficient information to code with codable children: Secondary | ICD-10-CM

## 2014-02-17 DIAGNOSIS — M8450XA Pathological fracture in neoplastic disease, unspecified site, initial encounter for fracture: Secondary | ICD-10-CM

## 2014-02-17 LAB — GLUCOSE, CAPILLARY
GLUCOSE-CAPILLARY: 208 mg/dL — AB (ref 70–99)
Glucose-Capillary: 294 mg/dL — ABNORMAL HIGH (ref 70–99)
Glucose-Capillary: 248 mg/dL — ABNORMAL HIGH (ref 70–99)
Glucose-Capillary: 283 mg/dL — ABNORMAL HIGH (ref 70–99)

## 2014-02-17 MED ORDER — HYDROMORPHONE HCL PF 1 MG/ML IJ SOLN
1.0000 mg | Freq: Once | INTRAMUSCULAR | Status: AC
  Administered 2014-02-17: 1 mg via INTRAVENOUS
  Filled 2014-02-17: qty 1

## 2014-02-17 NOTE — Progress Notes (Signed)
Clinical Social Work  CSW met with patient at bedside. Patient sitting in chair and eating his lunch. Patient reports that he is feeling better and wants to cure his cancer so that he can move on with his life. Patient spoke about his medications and the need to continue receiving pain medication. CSW tried to direct patient to talk about emotional wellbeing but patient guarded. Patient reports he will talk about his medications but does not want to discuss anything further at this time. CSW will follow up at later time.  [place], [place] [phone]

## 2014-02-17 NOTE — Progress Notes (Signed)
[facility] Therapy Treatment Record Phone [phone]   Radiation Therapy was administered to Frank Ward on: 02/17/2014  2:47 PM and was treatment # 6 out of a planned course of 10 treatments.

## 2014-02-17 NOTE — Consult Note (Signed)
[facility] Face-to-Face Psychiatry Consult   Reason for Consult: psychosis with agitation; insomnia Referring Physician:   Dr. Rockne Menghini Frank Ward is an 43 y.o. male. 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 Cancer     Thyroid  . Thyroid disease   . Hypertension   . Pneumonia    AXIS IV:  economic problems, occupational problems, other psychosocial or environmental problems, problems related to social environment and problems with primary support group AXIS V:  41-50 serious symptoms  Plan: Case discussed with Rama, staff RN and Sindy Messing, LCSW Continue Seroquel 147m Qhs, which can be titrated up to 600 mg a day if clinically required ContinueTrazodone 100 mg PO Qhs /PRN for insomnia Patient does not meet criteria for psychiatric inpatient admission. Supportive therapy provided about ongoing stressors. will provide abilify or haldol which was taken during last admission. Appreciate psychiatric consultation and follow up as clinically required Please contact [phone] if needs further assistance  Subjective:   Frank AKarsten Howryis a 43y.o. male patient admitted with confusion and agitation.  HPI:  Patient is seen along with HSindy Messingfor psychiatric consultation for frequent agitation and medication management. Patient is known to this provider and other treatment team from his previous hospitalization for the same issues. Patient is able to recognize this provider as psychiatrist even before introducing. He states that he is smart and knows his current medical condition and his mood changes. He [place] and than become unconscious required to be [facility] Patient is known to non  compliant with his medications and out patient radiation treatment in the past. He wants to be treated while in hospital because of his transportation issues and limited psychosocial support. He has history of heart attack, pneumonia and previously restrained because of agitation and aggression. he was also arrested while in hospital about six months ago. Patient denied current symptoms of psychosis and suicidal or homicidal ideations. He has spontaneous and pressured speech and tangential thought process.   Interval History: Patient is seen for psychiatric consultation follow up. Patient has no complaints today and has been compliant with his medication management and his staff complwill instructions. Patient is hoping to resume  first radiation treatment today am hoping positive results to control his pain. Patient denied disturbance of sleep and appetite  and slept much better wwith his current medication trazodone and seroquel at bed time. Patient has lesser pain due to his current medication, has positive attitude towards his current treatment and radiation treatment in near future.  behaviors He has appropriate responses and cooperative with staff regarding his treatments.   Review of Systems: As presented in the history of presenting illness, rest negative  HPI Elements:   Location:  psychosis and agitation. Quality:  poor'. Severity:  acute. Timing:  multiple psychosocial, medical and non compliance.  Past Psychiatric History: Past Medical History  Diagnosis Date  . Cancer     Thyroid  . Thyroid disease   . Hypertension   . Pneumonia     reports that he quit smoking about 2 years ago. His smoking use included Cigarettes. He smoked 0.00 packs per day for 10 years. He has never used smokeless  tobacco. He reports that he drinks about 4.8 ounces of alcohol per week. He reports that he uses illicit drugs (Marijuana). Family History  Problem Relation Age of Onset  . Diabetes Mother   .  Diabetes Brother      Living Arrangements: Other relatives   Abuse/Neglect [facility]) Physical Abuse: Denies Verbal Abuse: Denies Sexual Abuse: Denies Allergies:   Allergies  Allergen Reactions  . Penicillins Anaphylaxis, Hives and Itching  . Latex Hives and Itching  . Morphine And Related Hives, Nausea And Vomiting and Swelling    Swelling in body    ACT Assessment Complete:  NO Objective: Blood pressure 115/63, pulse 97, temperature 98.4 F (36.9 C), temperature source Oral, resp. rate 18, height '6\' 4"'  (1.93 m), weight 85.866 kg (189 lb 4.8 oz), SpO2 97.00%.Body mass index is 23.05 kg/(m^2). Results for orders placed during the hospital encounter of 02/06/14 (from the past 72 hour(s))  GLUCOSE, CAPILLARY     Status: Abnormal   Collection Time    02/14/14  5:08 PM      Result Value Ref Range   Glucose-Capillary 368 (*) 70 - 99 mg/dL  GLUCOSE, CAPILLARY     Status: Abnormal   Collection Time    02/14/14  9:41 PM      Result Value Ref Range   Glucose-Capillary 198 (*) 70 - 99 mg/dL   Comment 1 Notify RN    GLUCOSE, CAPILLARY     Status: Abnormal   Collection Time    02/15/14  1:37 AM      Result Value Ref Range   Glucose-Capillary 222 (*) 70 - 99 mg/dL   Comment 1 Notify RN    GLUCOSE, CAPILLARY     Status: Abnormal   Collection Time    02/15/14  7:59 AM      Result Value Ref Range   Glucose-Capillary 288 (*) 70 - 99 mg/dL  GLUCOSE, CAPILLARY     Status: Abnormal   Collection Time    02/15/14 12:34 PM      Result Value Ref Range   Glucose-Capillary 184 (*) 70 - 99 mg/dL  GLUCOSE, CAPILLARY     Status: Abnormal   Collection Time    02/15/14  5:42 PM      Result Value Ref Range   Glucose-Capillary 138 (*) 70 - 99 mg/dL  GLUCOSE, CAPILLARY     Status: Abnormal   Collection Time    02/15/14  9:34 PM      Result Value Ref Range   Glucose-Capillary 162 (*) 70 - 99 mg/dL   Comment 1 Notify RN    HEMOGLOBIN A1C     Status: Abnormal   Collection Time    02/16/14  4:25  AM      Result Value Ref Range   Hemoglobin A1C 6.5 (*) <5.7 %   Comment: (NOTE)                                                                               According to the ADA Clinical Practice Recommendations for 2011, when     HbA1c is used as a screening test:      >=6.5%   Diagnostic of Diabetes Mellitus               (  if abnormal result is confirmed)     5.7-6.4%   Increased risk of developing Diabetes Mellitus     References:Diagnosis and Classification of Diabetes Mellitus,Diabetes     JHER,7408,14(GYJEH 1):S62-S69 and Standards of Medical Care in             Diabetes - 2011,Diabetes UDJS,9702,63 (Suppl 1):S11-S61.   Mean Plasma Glucose 140 (*) <117 mg/dL   Comment: Performed at [place]     Status: Abnormal   Collection Time    02/16/14  4:25 AM      Result Value Ref Range   Sodium 140  137 - 147 mEq/L   Potassium 3.9  3.7 - 5.3 mEq/L   Chloride 103  96 - 112 mEq/L   CO2 24  19 - 32 mEq/L   Glucose, Bld 110 (*) 70 - 99 mg/dL   BUN 16  6 - 23 mg/dL   Creatinine, Ser 0.75  0.50 - 1.35 mg/dL   Calcium 8.5  8.4 - 10.5 mg/dL   GFR calc non Af Amer >90  >90 mL/min   GFR calc Af Amer >90  >90 mL/min   Comment: (NOTE)     The eGFR has been calculated using the CKD EPI equation.     This calculation has not been validated in all clinical situations.     eGFR's persistently <90 mL/min signify possible Chronic Kidney     Disease.   Anion gap 13  5 - 15  GLUCOSE, CAPILLARY     Status: Abnormal   Collection Time    02/16/14  8:14 AM      Result Value Ref Range   Glucose-Capillary 162 (*) 70 - 99 mg/dL  GLUCOSE, CAPILLARY     Status: Abnormal   Collection Time    02/16/14 12:05 PM      Result Value Ref Range   Glucose-Capillary 101 (*) 70 - 99 mg/dL  GLUCOSE, CAPILLARY     Status: Abnormal   Collection Time    02/16/14  5:14 PM      Result Value Ref Range   Glucose-Capillary 139 (*) 70 - 99 mg/dL  GLUCOSE, CAPILLARY     Status:  Abnormal   Collection Time    02/16/14 10:13 PM      Result Value Ref Range   Glucose-Capillary 140 (*) 70 - 99 mg/dL  GLUCOSE, CAPILLARY     Status: Abnormal   Collection Time    02/17/14  7:36 AM      Result Value Ref Range   Glucose-Capillary 208 (*) 70 - 99 mg/dL   Comment 1 Documented in Chart     Comment 2 Notify RN    GLUCOSE, CAPILLARY     Status: Abnormal   Collection Time    02/17/14 11:22 AM      Result Value Ref Range   Glucose-Capillary 294 (*) 70 - 99 mg/dL   Comment 1 Documented in Chart     Comment 2 Notify RN     Labs are reviewed and are pertinent for .  Current Facility-Administered Medications  Medication Dose Route Frequency Provider Last Rate Last Dose  . acetaminophen (TYLENOL) tablet 650 mg  650 mg Oral Q6H PRN Rise Patience, MD   650 mg at 02/14/14 1741   Or  . acetaminophen (TYLENOL) suppository 650 mg  650 mg Rectal Q6H PRN Rise Patience, MD      . dexamethasone (DECADRON) tablet 4 mg  4 mg  Oral 4 times per day Elmarie Shiley, MD   4 mg at 02/17/14 1228  . docusate sodium (COLACE) capsule 100 mg  100 mg Oral BID PRN Rise Patience, MD      . enoxaparin (LOVENOX) injection 40 mg  40 mg Subcutaneous Q24H Rise Patience, MD   40 mg at 02/17/14 1045  . furosemide (LASIX) tablet 20 mg  20 mg Oral Daily Rise Patience, MD   20 mg at 02/15/14 1150  . HYDROmorphone (DILAUDID) injection 2 mg  2 mg Intravenous Q2H PRN Belkys A Regalado, MD   2 mg at 02/17/14 1228  . HYDROmorphone (DILAUDID) tablet 4 mg  4 mg Oral Q4H PRN Belkys A Regalado, MD   4 mg at 02/13/14 0533  . ibuprofen (ADVIL,MOTRIN) tablet 200-400 mg  200-400 mg Oral Q6H PRN Rise Patience, MD   400 mg at 02/13/14 0252  . insulin aspart (novoLOG) injection 0-20 Units  0-20 Units Subcutaneous TID WC Venetia Maxon Rama, MD   7 Units at 02/17/14 0850  . insulin aspart (novoLOG) injection 0-5 Units  0-5 Units Subcutaneous QHS Christina P Rama, MD      . levothyroxine  (SYNTHROID, LEVOTHROID) tablet 150 mcg  150 mcg Oral QAC breakfast Venetia Maxon Rama, MD   150 mcg at 02/17/14 0853  . LORazepam (ATIVAN) tablet 1 mg  1 mg Oral Q8H PRN Rise Patience, MD   1 mg at 02/12/14 2254  . morphine (MS CONTIN) 12 hr tablet 60 mg  60 mg Oral Q12H Rise Patience, MD   60 mg at 02/17/14 1045  . ondansetron (ZOFRAN) tablet 4 mg  4 mg Oral Q6H PRN Rise Patience, MD       Or  . ondansetron [facility]) injection 4 mg  4 mg Intravenous Q6H PRN Rise Patience, MD      . pantoprazole (PROTONIX) EC tablet 40 mg  40 mg Oral Daily Rise Patience, MD   40 mg at 02/17/14 1049  . QUEtiapine (SEROQUEL XR) 24 hr tablet 100 mg  100 mg Oral Daily Belkys A Regalado, MD   100 mg at 02/17/14 1046  . traZODone (DESYREL) tablet 100 mg  100 mg Oral QHS PRN Durward Parcel, MD   100 mg at 02/15/14 2151    Psychiatric Specialty Exam: Physical Exam Full physical performed in Emergency Department. I have reviewed this assessment and concur with its findings.   Review of Systems  Musculoskeletal: Positive for back pain, myalgias and neck pain.  Psychiatric/Behavioral: Positive for depression. The patient is nervous/anxious and has insomnia.     Blood pressure 115/63, pulse 97, temperature 98.4 F (36.9 C), temperature source Oral, resp. rate 18, height '6\' 4"'  (1.93 m), weight 85.866 kg (189 lb 4.8 oz), SpO2 97.00%.Body mass index is 23.05 kg/(m^2).  General Appearance: Guarded  Eye Contact::  Good  Speech:  Pressured  Volume:  Increased  Mood:  Anxious, Depressed and Irritable  Affect:  Appropriate and Congruent  Thought Process:  Coherent and Goal Directed  Orientation:  Full (Time, Place, and Person)  Thought Content:  Rumination  Suicidal Thoughts:  No  Homicidal Thoughts:  No  Memory:  Immediate;   Fair  Judgement:  Impaired  Insight:  Fair  Psychomotor Activity:  Restlessness  Concentration:  Fair  Recall:  [employer] of Knowledge:Fair  Language:  Good  Akathisia:  NA  Handed:  Right  AIMS (if indicated):  Assets:  Communication Skills Desire for [place] Talents/Skills Transportation  Sleep:      Musculoskeletal: Strength & Muscle Tone: within normal limits Gait & Station: broad based Patient leans: N/A  Treatment Plan Summary: Daily contact with patient to assess and evaluate symptoms and progress in treatment Medication management  Frank Ward,Frank R. 02/17/2014 12:42 PM

## 2014-02-17 NOTE — Progress Notes (Signed)
Weekly Management Note:  Site: TL spine (T12-L2)/sacrum Current Dose:  1800  cGy Projected Dose: 3000  cGy  Narrative: The patient is seen today for routine under treatment assessment. CBCT/MVCT images/port films were reviewed. The chart was reviewed.   He is into his second and final week of palliative radiotherapy to his thoracic/upper lumbar spine and sacrum. He tells that his pain is better control, and he is sleeping better. He still has bilateral radicular extremity pain and weakness.  Physical Examination:  Filed Vitals:   02/17/14 0524  BP: 115/63  Pulse: 97  Temp: 98.4 F (36.9 C)  Resp: 18  .  Weight: 189 lb 4.8 oz (85.866 kg). No change.  Impression: Tolerating radiation therapy well. He is scheduled to finish his radiation therapy this Friday, July 10.  Plan: Continue radiation therapy as planned.

## 2014-02-17 NOTE — Progress Notes (Signed)
Progress Note   Frank Ward DZH:[id number] DOB: 1970-12-19 DOA: 02/06/2014 PCP: No PCP Per Patient   Brief Narrative:   Frank Ward is an 43 y.o. 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 He is currently receiving radiation therapy, which is scheduled to finish up on 02/21/14.  Assessment/Plan:   Principal Problem:   Acute encephalopathy  Multifactorial with possible drug overdose responsible for his initial unresponsiveness.  CT of the head negative for acute changes except for his known metastatic disease.  Resolved, patient at baseline mental status.  Active Problems:   Noncompliance with medications  Nursing staff reports that he is refusing some of his medications, notably Decadron.    Encouraged to take his medications as prescribed.    Steroid-induced hyperglycemia  CBGs 101-162, on insulin resistant SSI with 10 units of NovoLog for meals coverage and 20 units of Lantus daily.  Stop Lantus and meal coverage, given improvement in blood glucoses and risk of hypoglycemia since he is refusing Decadron at times.  Hemoglobin A1c 6.5%.    Hypothyroidism status post thyroidectomy  Continue Synthroid. Goal TSH less than 1.0. Most recent TSH 5.80, down from 27.12 approximately one month ago.  Increased Synthroid to 150 mcg as recommended by Dr. Dwyane Dee of endocrinology 01/20/14.    Psychotic disorder NOS  Lack capacity for medical decision-making. Aggressive behavior appears to be centered around obtaining pain medications, however he  does have known metastatic cancer with bone involvement, receiving active radiation treatment to sacrum and T12-L2. Has known brain metastasis.  Continue Seroquel, trazodone, and as needed Ativan.  Psychiatrically calm, less paranoid today.    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 Currently receiving radiation therapy.  Continue neck collar per prior neurosurgery recommendations.  Continue oral Decadron.  Continue pain management efforts with Dilaudid, Ibuprofen.  Continue Synthroid.  Palliative care consultation following patient for assistance with pain management and disposition issues.    Ulcer of left dorsal foot  Wound care per wound care RN recommendations.    DVT Prophylaxis  Continue Lovenox.  Code Status: DNR Family Communication: No family at the bedside. Disposition Plan: Home when stable.   IV Access:    Peripheral IV   Procedures:    Right lower extremity venous Doppler 02/08/14: No DVT or SVT in the right lower extremity.   Medical Consultants:    Dr. Rhea Pink, Palliative care  Dr. Eppie Gibson, Radiation oncology  Dr. Durward Parcel, Psychiatry   Other Consultants:    None.   Anti-Infectives:    None.  Subjective:   Frank Ward is calm today. He is without significant complaints other than pain for which he is still using pain medication every 2 hours. Denies nausea or vomiting. Bowels are moving. Feels a little bit bloated.  Objective:    Filed Vitals:   02/16/14 0441 02/16/14 1426 02/16/14 2210 02/17/14 0524  BP: 115/61 100/52 108/54 115/63  Pulse: 99  88 98 97  Temp: 97.6 F (36.4 C) 98.2 F (36.8 C) 98.1 F (36.7 C) 98.4 F (36.9 C)  TempSrc: Oral Oral Oral Oral  Resp:  18 18 18 18   Height:      Weight:      SpO2: 96% 93% 98% 97%    Intake/Output Summary (Last 24 hours) at 02/17/14 0739 Last data filed at 02/16/14 1700  Gross per 24 hour  Intake    720 ml  Output      0 ml  Net    720 ml    Exam: Gen:  NAD Cardiovascular:  RRR, No M/R/G Respiratory:  Lungs CTAB Gastrointestinal:  Abdomen softly distended, + BS Extremities:  1-2+ edema   Data Reviewed:    Labs: Basic Metabolic Panel:  Recent Labs Lab 02/12/14 0405 02/14/14 0433 02/16/14 0425  NA 138  --  140  K 4.0  --  3.9  CL 101  --  103  CO2 25  --  24  GLUCOSE 211*  --  110*  BUN 13  --  16  CREATININE 0.76 0.77 0.75  CALCIUM 8.8  --  8.5   GFR Estimated Creatinine Clearance: 141.7 ml/min (by C-G formula based on Cr of 0.75). Liver Function Tests:  Recent Labs Lab 02/12/14 0405  AST 13  ALT 15  ALKPHOS 89  BILITOT <0.2*  PROT 5.2*  ALBUMIN 2.8*    Recent Labs Lab 02/12/14 0400  AMMONIA 32   CBC: No results found for this basename: WBC, NEUTROABS, HGB, HCT, MCV, PLT,  in the last 168 hours CBG:  Recent Labs Lab 02/15/14 2134 02/16/14 0814 02/16/14 1205 02/16/14 1714 02/16/14 2213  GLUCAP 162* 162* 101* 139* 140*     Radiographs/Studies:   Dg Chest 2 View  02/06/2014   CLINICAL DATA:  Altered mental status.  EXAM: CHEST  2 VIEW  COMPARISON:  12/09/2013  FINDINGS: Normal heart size and pulmonary vascularity. Multiple diffuse metastases throughout the lungs, similar to prior study. No developing airspace disease or consolidation. No blunting of costophrenic angles. No pneumothorax. Destructive bone lesion in the right scapula is probably metastatic.  IMPRESSION: Diffuse bilateral pulmonary metastases. Destructive bone metastasis in the right scapula.   Electronically Signed   By: Lucienne Capers M.D.   On: 02/06/2014 23:03   Ct Head Wo Contrast  02/06/2014   CLINICAL DATA:  Headaches and confusion.  EXAM: CT HEAD WITHOUT CONTRAST  TECHNIQUE:  Contiguous axial images were obtained from the base of the skull through the vertex without intravenous contrast.  COMPARISON:  CT head 01/16/2013.  MRI brain 12/26/2013.  FINDINGS: 1.3 cm diameter mass lesion in the fourth ventricle consistent with metastatic disease is enlarged since previous CT scan but appears stable since the prior MRI. Tiny additional nodular lesions demonstrated on the MRI are not appreciable on CT. Ventricles and sulci appear otherwise symmetrical. No significant ventricular dilatation. No abnormal extra-axial fluid collections. Gray-white matter junctions are distinct. Basal cisterns are not effaced. No evidence of acute intracranial hemorrhage. Calvarium appears intact. Opacification of the right frontal sinus with mucosal thickening in the maxillary antra bilaterally. Mastoid air cells are not opacified.  IMPRESSION: Re identification of known mass in the fourth ventricle. No acute intracranial abnormalities are suspected. Chronic inflammatory changes in the sinuses.   Electronically Signed   By: Lucienne Capers M.D.   On: 02/06/2014 23:38    Medications:   . dexamethasone  4 mg Oral 4 times per day  .  enoxaparin (LOVENOX) injection  40 mg Subcutaneous Q24H  . furosemide  20 mg Oral Daily  . insulin aspart  0-20 Units Subcutaneous TID WC  . insulin aspart  0-5 Units Subcutaneous QHS  . insulin aspart  10 Units Subcutaneous TID WC  . insulin glargine  20 Units Subcutaneous Daily  . levothyroxine  150 mcg Oral QAC breakfast  . morphine  60 mg Oral Q12H  . pantoprazole  40 mg Oral Daily  . QUEtiapine  100 mg Oral Daily   Continuous Infusions:   Time spent:  25 minutes.    LOS: 11 days   [facility] Pager [phone]. If unable to reach me by pager, please call my cell phone at [phone].  *Please refer to [email or web address], password [email or web address] to get updated schedule on who will round on this patient, as hospitalists switch teams weekly. If 7PM-7AM, please  contact night-coverage at [email or web address], password [email or web address] for any overnight needs.  02/17/2014, 7:39 AM    **Disclaimer: This note was dictated with voice recognition software. Similar sounding words can inadvertently be transcribed and this note may contain transcription errors which may not have been corrected upon publication of note.**

## 2014-02-17 NOTE — Progress Notes (Signed)
On call provider notified that patient is requesting an extra dose of dilaudid for unrelieved back pain.

## 2014-02-18 ENCOUNTER — Ambulatory Visit
Admission: RE | Admit: 2014-02-18 | Discharge: 2014-02-18 | Disposition: A | Discharge status: Home / Self Care | Payer: Medicaid Other | Source: Ambulatory Visit | Attending: Radiation Oncology | Admitting: Radiation Oncology

## 2014-02-18 ENCOUNTER — Ambulatory Visit: Payer: Medicaid Other

## 2014-02-18 DIAGNOSIS — F4323 Adjustment disorder with mixed anxiety and depressed mood: Secondary | ICD-10-CM

## 2014-02-18 LAB — GLUCOSE, CAPILLARY
GLUCOSE-CAPILLARY: 144 mg/dL — AB (ref 70–99)
Glucose-Capillary: 318 mg/dL — ABNORMAL HIGH (ref 70–99)
Glucose-Capillary: 98 mg/dL (ref 70–99)
Glucose-Capillary: 239 mg/dL — ABNORMAL HIGH (ref 70–99)

## 2014-02-18 MED ORDER — HYDROMORPHONE HCL PF 1 MG/ML IJ SOLN
1.0000 mg | Freq: Every day | INTRAMUSCULAR | Status: DC | PRN
  Administered 2014-02-18 – 2014-02-21 (×4): 1 mg via INTRAVENOUS
  Filled 2014-02-18 (×4): qty 1

## 2014-02-18 NOTE — Progress Notes (Signed)
Inpatient Diabetes Program Recommendations  AACE/ADA: New Consensus Statement on Inpatient Glycemic Control (2013)  Target Ranges:  Prepandial:   less than 140 mg/dL      Peak postprandial:   less than 180 mg/dL (1-2 hours)      Critically ill patients:  140 - 180 mg/dL  Results for [name], [name] (MRN [id number]) as of 02/18/2014 10:37  Ref. Range 02/17/2014 07:36 02/17/2014 11:22 02/17/2014 16:55 02/17/2014 21:40 02/18/2014 07:44  Glucose-Capillary Latest Range: 70-99 mg/dL 208 (H) 294 (H) 248 (H) 283 (H) 318 (H)    Inpatient Diabetes Program Recommendations Insulin - Meal Coverage: consider adding Novolog 4 units TID with meals during steroid therapy   Thank you  Raoul Pitch BSN, RN,CDE Inpatient Diabetes Coordinator [phone] (team pager)

## 2014-02-18 NOTE — Progress Notes (Signed)
Patient states he is conducting 'experiments'  To see how different drugs react in his body. As such he is only taking certain meds, and certain meds at his desired doses. He only took half his dose of seroquel and MS contin this am. I tried educating regarding the symptoms he is experiencing as most likely being related to the IV dilaudid, but patient unwilling to listen. See MAR for meds received and doses administered.

## 2014-02-18 NOTE — Progress Notes (Signed)
[facility] Therapy Treatment Record Phone [phone]   Radiation Therapy was administered to Frank Ward on: 02/18/2014  2:04 PM and was treatment # 7 out of a planned course of 10 treatments.

## 2014-02-18 NOTE — Progress Notes (Signed)
Patient ID: Frank Ward, male   DOB: 07-23-1971, 43 y.o.   MRN: [id number] [facility] PROGRESS NOTE  Frank Ward [id number] DOB: 1971/03/05 DOA: 02/06/2014 PCP: No PCP Per Patient  Brief narrative: 43 y.o. 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 He is currently receiving radiation therapy, which is scheduled to finish up on 02/21/14.   Assessment/Plan:  Principal Problem:  Acute encephalopathy  Multifactorial with possible drug overdose responsible for his initial unresponsiveness.  CT of the head negative for acute changes except for his known metastatic disease.  Resolved, patient at baseline mental status. Active Problems:  Noncompliance with medications  Nursing staff reports that he is refusing some of his medications, notably Decadron.  Encouraged to take his medications as prescribed. Currently compliant with medications  Steroid-induced hyperglycemia  CBGs 101-162, on insulin resistant SSI with 10 units of NovoLog for meals coverage and 20 units of Lantus daily. Stop Lantus and meal coverage, given improvement in blood glucoses and risk of hypoglycemia since he is refusing Decadron at times.  Hemoglobin A1c 6.5%. Hypothyroidism status post thyroidectomy  Continue Synthroid. Goal TSH less than 1.0. Most recent TSH 5.80, down from 27.12 approximately one month ago.  Increased Synthroid to 150 mcg as recommended by Dr. Dwyane Dee of endocrinology 01/20/14. Psychotic disorder NOS  Lack capacity for medical decision-making. Aggressive behavior  appears to be centered around obtaining pain medications, however he does have known metastatic cancer with bone involvement, receiving active radiation treatment to sacrum and T12-L2. Has known brain metastasis.  Continue Seroquel, trazodone, and as needed Ativan.  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 Currently receiving radiation therapy.  Continue neck collar per prior neurosurgery recommendations.  Continue oral Decadron.  Continue pain management efforts with Dilaudid, Ibuprofen.  Continue Synthroid.  Palliative care consultation following patient for assistance with pain management and disposition issues. Ulcer of left dorsal foot  Wound care per wound care RN recommendations.  DVT prophylaxis: Lovenox sub Q  Code Status: DNR  Family Communication: No family at the bedside.  Disposition Plan: Home when stable.    Leisa Lenz, MD  [facility] Pager [phone]  If 7PM-7AM, please contact night-coverage [email or web address] Password [email or web address] 02/18/2014, 8:25 AM   LOS: 12 days   Consultants:  Dr. Rhea Pink, Palliative care   Dr. Eppie Gibson, Radiation oncology   Dr. Durward Parcel, Psychiatry  Procedures:  Right lower extremity venous Doppler 02/08/14: No DVT or SVT in the right lower extremity.  Antibiotics:  None   HPI/Subjective: No acute overnight events.  Objective: Filed Vitals:   02/17/14 0524 02/17/14 1445 02/17/14 2142 02/18/14 0454  BP: 115/63 110/67 132/69 124/73  Pulse: 97 100 100 89  Temp: 98.4 F (36.9 C) 98.1 F (36.7 C) 97.5 F (36.4 C) 97.9 F (36.6 C)  TempSrc: Oral Oral Oral Oral  Resp: 18 18 18 18   Height:      Weight:  SpO2: 97% 95% 99% 97%     Intake/Output Summary (Last 24 hours) at 02/18/14 0825 Last data filed at 02/18/14 0454  Gross per 24 hour  Intake   1320 ml  Output      0 ml  Net   1320 ml    Exam:   General:  Pt is alert, follows commands appropriately, not in acute distress, has neck collar  Cardiovascular: Regular rate and rhythm, [name], no murmurs  Respiratory: Clear to auscultation bilaterally, no wheezing, no crackles, no rhonchi  Abdomen: Soft, non tender, non distended, bowel sounds present  Extremities: trace pedal edema, pulses DP and PT palpable bilaterally  Neuro: Grossly nonfocal  Data Reviewed: Basic Metabolic Panel:  Recent Labs Lab 02/12/14 0405 02/14/14 0433 02/16/14 0425  NA 138  --  140  K 4.0  --  3.9  CL 101  --  103  CO2 25  --  24  GLUCOSE 211*  --  110*  BUN 13  --  16  CREATININE 0.76 0.77 0.75  CALCIUM 8.8  --  8.5   Liver Function Tests:  Recent Labs Lab 02/12/14 0405  AST 13  ALT 15  ALKPHOS 89  BILITOT <0.2*  PROT 5.2*  ALBUMIN 2.8*    Recent Labs Lab 02/12/14 0400  AMMONIA 32   CBG:  Recent Labs Lab 02/17/14 0736 02/17/14 1122 02/17/14 1655 02/17/14 2140 02/18/14 0744  GLUCAP 208* 294* 248* 283* 318*   Studies: No results found.  Scheduled Meds: . dexamethasone  4 mg Oral 4 times per day  . enoxaparin (LOVENOX) injection  40 mg Subcutaneous Q24H  . furosemide  20 mg Oral Daily  . insulin aspart  0-20 Units Subcutaneous TID WC  . insulin aspart  0-5 Units Subcutaneous QHS  . levothyroxine  150 mcg Oral QAC breakfast  . morphine  60 mg Oral Q12H  . pantoprazole  40 mg Oral Daily  . QUEtiapine  100 mg Oral Daily   Continuous Infusions:

## 2014-02-19 ENCOUNTER — Ambulatory Visit: Payer: Medicaid Other

## 2014-02-19 ENCOUNTER — Ambulatory Visit
Admission: RE | Admit: 2014-02-19 | Discharge: 2014-02-19 | Disposition: A | Discharge status: Home / Self Care | Payer: Medicaid Other | Source: Ambulatory Visit | Attending: Radiation Oncology | Admitting: Radiation Oncology

## 2014-02-19 LAB — GLUCOSE, CAPILLARY
GLUCOSE-CAPILLARY: 205 mg/dL — AB (ref 70–99)
GLUCOSE-CAPILLARY: 218 mg/dL — AB (ref 70–99)
Glucose-Capillary: 199 mg/dL — ABNORMAL HIGH (ref 70–99)
Glucose-Capillary: 187 mg/dL — ABNORMAL HIGH (ref 70–99)

## 2014-02-19 NOTE — Progress Notes (Signed)
Progress Note from the Palliative Medicine Team at [facility]:  -patient is alert and oriented but easily agitated when discussion regarding discharge plan for pain management and location for discharge is initiated  -he has no PCP (to prescribe medications  or manage his pain needs)once discharged, he was admitted recently with and OD in [facility] and then transported her to [facility]  -he refuses to include any family in his discharge planning    Objective: Allergies  Allergen Reactions  . Penicillins Anaphylaxis, Hives and Itching  . Latex Hives and Itching  . Morphine And Related Hives, Nausea And Vomiting and Swelling    Swelling in body   Scheduled Meds: . dexamethasone  4 mg Oral 4 times per day  . enoxaparin (LOVENOX) injection  40 mg Subcutaneous Q24H  . furosemide  20 mg Oral Daily  . insulin aspart  0-20 Units Subcutaneous TID WC  . insulin aspart  0-5 Units Subcutaneous QHS  . levothyroxine  150 mcg Oral QAC breakfast  . morphine  60 mg Oral Q12H  . pantoprazole  40 mg Oral Daily  . QUEtiapine  100 mg Oral Daily   Continuous Infusions:  PRN Meds:.acetaminophen, acetaminophen, docusate sodium, HYDROmorphone (DILAUDID) injection, HYDROmorphone (DILAUDID) injection, HYDROmorphone, ibuprofen, LORazepam, ondansetron (ZOFRAN) IV, ondansetron, traZODone  BP 122/70  Pulse 90  Temp(Src) 98 F (36.7 C) (Oral)  Resp 20  Ht 6\' 4"  (1.93 m)  Wt 85.866 kg (189 lb 4.8 oz)  BMI 23.05 kg/m2  SpO2 99%   PPS: 60 %  Pain Score: intermittent rates 6/10     Intake/Output Summary (Last 24 hours) at 02/19/14 1406 Last data filed at 02/19/14 0928  Gross per 24 hour  Intake   1320 ml  Output      0 ml  Net   1320 ml       Physical Exam: General: chronically ill appearing, NAD, walking with cane  HEENT: moist buccal membranes, no exudate  Chest: CTA  CVS: RRR  Abdomen: soft NT +BS  Ext: Without edema Neuro: oriented X3  Psych: Patient's insight and  judgement surrounding his illness and care needs are poor   Labs: CBC    Component Value Date/Time   WBC 7.7 02/08/2014 0440   RBC 3.97* 02/08/2014 0440   HGB 12.0* 02/08/2014 0440   HCT 36.5* 02/08/2014 0440   PLT 417* 02/08/2014 0440   MCV 91.9 02/08/2014 0440   MCH 30.2 02/08/2014 0440   MCHC 32.9 02/08/2014 0440   RDW 14.1 02/08/2014 0440   LYMPHSABS 0.3* 02/07/2014 0640   MONOABS 0.1 02/07/2014 0640   EOSABS 0.0 02/07/2014 0640   BASOSABS 0.0 02/07/2014 0640    BMET    Component Value Date/Time   NA 140 02/16/2014 0425   K 3.9 02/16/2014 0425   CL 103 02/16/2014 0425   CO2 24 02/16/2014 0425   GLUCOSE 110* 02/16/2014 0425   BUN 16 02/16/2014 0425   CREATININE 0.75 02/16/2014 0425   CALCIUM 8.5 02/16/2014 0425   GFRNONAA >90 02/16/2014 0425   GFRAA >90 02/16/2014 0425    CMP     Component Value Date/Time   NA 140 02/16/2014 0425   K 3.9 02/16/2014 0425   CL 103 02/16/2014 0425   CO2 24 02/16/2014 0425   GLUCOSE 110* 02/16/2014 0425   BUN 16 02/16/2014 0425   CREATININE 0.75 02/16/2014 0425   CALCIUM 8.5 02/16/2014 0425   PROT 5.2* 02/12/2014 0405   ALBUMIN 2.8* 02/12/2014 0405  AST 13 02/12/2014 0405   ALT 15 02/12/2014 0405   ALKPHOS 89 02/12/2014 0405   BILITOT <0.2* 02/12/2014 0405   GFRNONAA >90 02/16/2014 0425   GFRAA >90 02/16/2014 0425     Assessment and Plan: 1. Code Status:DNR/DNI 2. Symptom Control: 1. Anxiety/Agitation: Ativan 1 mg po every 8 hrs prn                               Serooquel XR 100 mg daily per psychiatry                           2.  Insomnia: trazodone 100 mg po prn qhs-per psychiatry 3.  Pain: MS Contin 60 mg every 12 hrs prn         Currently being managed with IV Dilaudid only prn, he has consistently refused oral agents.  In preparation for discharge I recommend attempt to control patient's pain with oral agents, today he tells me he "will not do that".                    He refuses to try titration of ER forms of MS Contin. "nothing works but IV Dilaudid"                                              Has refused Biphosphonates      Recommendation  Dilaudid 4 mg po every 4 hrs prn.  Hopefully radiation will decrease pain medication needs.   3. Spiritual  Chaplain support in place  4. Disposition:  I have many concerns regarding discharge plan.  He tells me he has "family to go to", but refuses to allow me to contact to discuss plan.  He has no PCP will will continue to treat and manage his cancer related pain.  He has poor insight into his diagnosis, prognosis and options.  Difficult situation.   Time In Time Out Total Time Spent with Patient Total Overall Time  1345 1420 35 min 35 min    Greater than 50%  of this time was spent counseling and coordinating care related to the above assessment and plan.  Wadie Lessen NP  Palliative Medicine Team Team Phone # [phone] Pager [phone]  Discussed with Jorene Guest LCSW 1

## 2014-02-19 NOTE — Progress Notes (Signed)
[facility] Therapy Treatment Record Phone [phone]   Radiation Therapy was administered to [place] on: 02/19/2014  2:44 PM and was treatment # 8 out of a planned course of 10 treatments.

## 2014-02-19 NOTE — Progress Notes (Signed)
Patient ID: Frank Ward, male   DOB: 23-Mar-1971, 43 y.o.   MRN: [id number] [facility] PROGRESS NOTE  Frank Ward [id number] DOB: Oct 02, 1970 DOA: 02/06/2014 PCP: No PCP Per Patient  Brief narrative: 43 y.o. 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 He is currently receiving radiation therapy which is scheduled to finish on 02/21/14.   Assessment/Plan:   Principal Problem:  Acute encephalopathy  Multifactorial with possible drug overdose responsible for his initial unresponsiveness.  CT of the head negative for acute changes except for his known metastatic disease.  Resolved, patient at baseline mental status. Active Problems:  Noncompliance with medications  Nursing staff reports that he is refusing some of his medications, notably Decadron.  Encouraged to take his medications as prescribed.  Steroid-induced hyperglycemia  CBGs 101-162, on insulin resistant SSI with 10 units of NovoLog for meals coverage and 20 units of Lantus daily. Stop Lantus and meal coverage, given improvement in blood glucoses and risk of hypoglycemia since he is refusing Decadron at times.  Hemoglobin A1c 6.5%. Hypothyroidism status post thyroidectomy  Continue Synthroid. Goal TSH less than 1.0. Most recent TSH 5.80, down from 27.12 approximately one month ago.  Increased Synthroid to 150 mcg as recommended by Dr. Dwyane Dee of endocrinology 01/20/14. Psychotic disorder NOS  Lack capacity for medical decision-making. Aggressive behavior appears to be centered around obtaining  pain medications, however he does have known metastatic cancer with bone involvement, receiving active radiation treatment to sacrum and T12-L2. Has known brain metastasis.  Continue Seroquel, trazodone, and as needed Ativan.  Psychiatrically calm, not paranoid Anemia of chronic disease Secondary to history of malignancy. No current indications for transfusion. 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 Currently receiving radiation therapy.  Continue neck collar per prior neurosurgery recommendations.  Continue oral Decadron, patient encouraged to be compliant with medications. Per nursing staff he is refusing Decadron. Continue pain management efforts with Dilaudid, Ibuprofen.  Continue Synthroid.  Palliative care consultation following patient for assistance with pain management and disposition issues. Ulcer of left dorsal foot  Wound care per wound care RN recommendations.   DVT prophylaxis: Lovenox sub Q   Code Status: DNR  Family Communication: No family at the bedside.  Disposition Plan: Home when stable.   Consultants:  Dr. Rhea Pink, Palliative care  Dr. Eppie Gibson, Radiation oncology  Dr. Durward Parcel, Psychiatry Procedures:  Right lower extremity venous Doppler 02/08/14: No DVT or SVT in the right lower extremity. Antibiotics:  None    Leisa Lenz, MD  [facility] Pager [phone]  If 7PM-7AM, please contact night-coverage [email or web address] Password [email or web address] 02/19/2014, 10:18 AM   LOS: 13 days    HPI/Subjective: No acute overnight events.  Objective: Filed Vitals:   02/18/14 0454 02/18/14 1435 02/18/14 2115 02/19/14 0737  BP: 124/73 135/74 127/77 122/70  Pulse: 89 101 98 90  Temp: 97.9 F (36.6 C) 97.6 F (36.4 C) 97.9 F (36.6 C) 98 F (36.7 C)  TempSrc: Oral Oral Axillary Oral  Resp: 18 18 20 20   Height:  Weight:       SpO2: 97% 97% 98% 99%    Intake/Output Summary (Last 24 hours) at 02/19/14 1018 Last data filed at 02/19/14 0928  Gross per 24 hour  Intake   1320 ml  Output      0 ml  Net   1320 ml    Exam:   General:  Pt is alert, follows commands appropriately, not in acute distress  Cardiovascular: Regular rate and rhythm, [name], no murmurs  Respiratory: Clear to auscultation bilaterally, no wheezing, no crackles, no rhonchi  Abdomen: Soft, non tender, non distended, bowel sounds present  Extremities: No edema, pulses DP and PT palpable bilaterally  Neuro: Grossly nonfocal  Data Reviewed: Basic Metabolic Panel:  Recent Labs Lab 02/14/14 0433 02/16/14 0425  NA  --  140  K  --  3.9  CL  --  103  CO2  --  24  GLUCOSE  --  110*  BUN  --  16  CREATININE 0.77 0.75  CALCIUM  --  8.5   Liver Function Tests: No results found for this basename: AST, ALT, ALKPHOS, BILITOT, PROT, ALBUMIN,  in the last 168 hours No results found for this basename: LIPASE, AMYLASE,  in the last 168 hours No results found for this basename: AMMONIA,  in the last 168 hours CBC: No results found for this basename: WBC, NEUTROABS, HGB, HCT, MCV, PLT,  in the last 168 hours Cardiac Enzymes: No results found for this basename: CKTOTAL, CKMB, CKMBINDEX, TROPONINI,  in the last 168 hours BNP: No components found with this basename: POCBNP,  CBG:  Recent Labs Lab 02/18/14 0744 02/18/14 1125 02/18/14 1654 02/18/14 2124 02/19/14 0719  GLUCAP 318* 98 239* 144* 199*    No results found for this or any previous visit (from the past 240 hour(s)).   Studies: No results found.  Scheduled Meds: . dexamethasone  4 mg Oral 4 times per day  . enoxaparin (LOVENOX) injection  40 mg Subcutaneous Q24H  . furosemide  20 mg Oral Daily  . insulin aspart  0-20 Units Subcutaneous TID WC  . insulin aspart  0-5 Units Subcutaneous QHS  . levothyroxine  150 mcg Oral QAC breakfast  . morphine  60 mg Oral Q12H   . pantoprazole  40 mg Oral Daily  . QUEtiapine  100 mg Oral Daily

## 2014-02-19 NOTE — Progress Notes (Signed)
Patient said he is not trying to take a bunch of pills at one time and only took 30mg  of his scheduled ms contin dose and only 50mg  of his seroquel dose.  He took all of his other scheduled medications.  Lorrene Reid

## 2014-02-19 NOTE — Progress Notes (Signed)
Lengthy visit. Patient talked about his journey with cancer, God, and family. Empathetic listening; encouraged focus on the present. Facilitated his process of finding meaning and purpose in his life right now. Presence; prayer.  Epifania Gore, PhD, [facility] Chaplain [phone]

## 2014-02-19 NOTE — Consult Note (Signed)
[facility] Face-to-Face Psychiatry Consult   Reason for Consult: psychosis with agitation; insomnia Referring Physician:   Dr. Rockne Menghini Frank Ward is an 43 y.o. male. 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 Cancer     Thyroid  . Thyroid disease   . Hypertension   . Pneumonia    AXIS IV:  economic problems, occupational problems, other psychosocial or environmental problems, problems related to social environment and problems with primary support group AXIS V:  41-50 serious symptoms  Plan: Continue Seroquel 100mg  Qhs, which can be titrated up to 600 mg a day if clinically required ContinueTrazodone 100 mg PO Qhs /PRN for insomnia Patient does not meet criteria for psychiatric inpatient admission. Supportive therapy provided about ongoing stressors. will provide abilify or haldol which was taken during last admission. Appreciate psychiatric consultation and follow up as clinically required Please contact [phone] if needs further assistance  Subjective:   Frank Ward is a 43 y.o. male patient admitted with confusion and agitation.  HPI:  Patient is seen along with Sindy Messing for psychiatric consultation for frequent agitation and medication management. Patient is known to this provider and other treatment team from his previous hospitalization for the same issues. Patient is able to recognize this provider as psychiatrist even before introducing. He states that he is smart and knows his current medical condition and his mood changes. He stated that he went to his sister home in [place] and took pain medication and than become unconscious required to be admitted to local hospital prior to coming to [facility]. Patient is known to non compliant with his medications and out patient radiation  treatment in the past. He wants to be treated while in hospital because of his transportation issues and limited psychosocial support. He has history of heart attack, pneumonia and previously restrained because of agitation and aggression. he was also arrested while in hospital about six months ago. Patient denied current symptoms of psychosis and suicidal or homicidal ideations. He has spontaneous and pressured speech and tangential thought process.   Interval History: Patient is seen for psychiatric consultation follow up. Patient has no complaints today and has been compliant with his medication management and cooperative with staff instructions. Patient stated that he has completed first radiation treatment and tolerated fine. Patient denied disturbance of sleep and appetite and sleep. He continue to have a positive attitude towards his treatment and radiation.   Review of Systems: As presented in the history of presenting illness, rest negative  HPI Elements:   Location:  psychosis and agitation. Quality:  poor'. Severity:  acute. Timing:  multiple psychosocial, medical and non compliance.  Past Psychiatric History: Past Medical History  Diagnosis Date  . Cancer     Thyroid  . Thyroid disease   . Hypertension   . Pneumonia     reports that he quit smoking about 2 years ago. His smoking use included Cigarettes. He smoked 0.00 packs per day for 10 years. He has never used smokeless tobacco. He reports that he drinks about 4.8 ounces of alcohol per week. He reports that he uses illicit drugs (Marijuana). Family History  Problem Relation Age of Onset  . Diabetes Mother   . Diabetes Brother      Living Arrangements: Other relatives   Abuse/Neglect [facility]) Physical  Abuse: Denies Verbal Abuse: Denies Sexual Abuse: Denies Allergies:   Allergies  Allergen Reactions  . Penicillins Anaphylaxis, Hives and Itching  . Latex Hives and Itching  . Morphine And Related Hives, Nausea And  Vomiting and Swelling    Swelling in body    ACT Assessment Complete:  NO Objective: Blood pressure 122/70, pulse 90, temperature 98 F (36.7 C), temperature source Oral, resp. rate 20, height 6\' 4"  (1.93 m), weight 85.866 kg (189 lb 4.8 oz), SpO2 99.00%.Body mass index is 23.05 kg/(m^2). Results for orders placed during the hospital encounter of 02/06/14 (from the past 72 hour(s))  GLUCOSE, CAPILLARY     Status: Abnormal   Collection Time    02/16/14  5:14 PM      Result Value Ref Range   Glucose-Capillary 139 (*) 70 - 99 mg/dL  GLUCOSE, CAPILLARY     Status: Abnormal   Collection Time    02/16/14 10:13 PM      Result Value Ref Range   Glucose-Capillary 140 (*) 70 - 99 mg/dL  GLUCOSE, CAPILLARY     Status: Abnormal   Collection Time    02/17/14  7:36 AM      Result Value Ref Range   Glucose-Capillary 208 (*) 70 - 99 mg/dL   Comment 1 Documented in Chart     Comment 2 Notify RN    GLUCOSE, CAPILLARY     Status: Abnormal   Collection Time    02/17/14 11:22 AM      Result Value Ref Range   Glucose-Capillary 294 (*) 70 - 99 mg/dL   Comment 1 Documented in Chart     Comment 2 Notify RN    GLUCOSE, CAPILLARY     Status: Abnormal   Collection Time    02/17/14  4:55 PM      Result Value Ref Range   Glucose-Capillary 248 (*) 70 - 99 mg/dL   Comment 1 Documented in Chart     Comment 2 Notify RN    GLUCOSE, CAPILLARY     Status: Abnormal   Collection Time    02/17/14  9:40 PM      Result Value Ref Range   Glucose-Capillary 283 (*) 70 - 99 mg/dL   Comment 1 Notify RN    GLUCOSE, CAPILLARY     Status: Abnormal   Collection Time    02/18/14  7:44 AM      Result Value Ref Range   Glucose-Capillary 318 (*) 70 - 99 mg/dL   Comment 1 Documented in Chart     Comment 2 Notify RN    GLUCOSE, CAPILLARY     Status: None   Collection Time    02/18/14 11:25 AM      Result Value Ref Range   Glucose-Capillary 98  70 - 99 mg/dL   Comment 1 Documented in Chart     Comment 2 Notify RN     GLUCOSE, CAPILLARY     Status: Abnormal   Collection Time    02/18/14  4:54 PM      Result Value Ref Range   Glucose-Capillary 239 (*) 70 - 99 mg/dL   Comment 1 Documented in Chart     Comment 2 Notify RN    GLUCOSE, CAPILLARY     Status: Abnormal   Collection Time    02/18/14  9:24 PM      Result Value Ref Range   Glucose-Capillary 144 (*) 70 - 99 mg/dL   Comment 1 Notify RN  GLUCOSE, CAPILLARY     Status: Abnormal   Collection Time    02/19/14  7:19 AM      Result Value Ref Range   Glucose-Capillary 199 (*) 70 - 99 mg/dL   Comment 1 Documented in Chart     Comment 2 Notify RN    GLUCOSE, CAPILLARY     Status: Abnormal   Collection Time    02/19/14 12:03 PM      Result Value Ref Range   Glucose-Capillary 205 (*) 70 - 99 mg/dL   Comment 1 Documented in Chart     Comment 2 Notify RN     Labs are reviewed and are pertinent for .  Current Facility-Administered Medications  Medication Dose Route Frequency Provider Last Rate Last Dose  . acetaminophen (TYLENOL) tablet 650 mg  650 mg Oral Q6H PRN Rise Patience, MD   650 mg at 02/14/14 1741   Or  . acetaminophen (TYLENOL) suppository 650 mg  650 mg Rectal Q6H PRN Rise Patience, MD      . dexamethasone (DECADRON) tablet 4 mg  4 mg Oral 4 times per day Elmarie Shiley, MD   4 mg at 02/19/14 0630  . docusate sodium (COLACE) capsule 100 mg  100 mg Oral BID PRN Rise Patience, MD      . enoxaparin (LOVENOX) injection 40 mg  40 mg Subcutaneous Q24H Rise Patience, MD   40 mg at 02/19/14 1033  . furosemide (LASIX) tablet 20 mg  20 mg Oral Daily Rise Patience, MD   20 mg at 02/19/14 1033  . HYDROmorphone (DILAUDID) injection 1 mg  1 mg Intravenous Daily PRN Robbie Lis, MD   1 mg at 02/18/14 1349  . HYDROmorphone (DILAUDID) injection 2 mg  2 mg Intravenous Q2H PRN Belkys A Regalado, MD   2 mg at 02/19/14 0928  . HYDROmorphone (DILAUDID) tablet 4 mg  4 mg Oral Q4H PRN Belkys A Regalado, MD   4 mg at  02/13/14 0533  . ibuprofen (ADVIL,MOTRIN) tablet 200-400 mg  200-400 mg Oral Q6H PRN Rise Patience, MD   400 mg at 02/13/14 0252  . insulin aspart (novoLOG) injection 0-20 Units  0-20 Units Subcutaneous TID WC Venetia Maxon Rama, MD   4 Units at 02/19/14 0820  . insulin aspart (novoLOG) injection 0-5 Units  0-5 Units Subcutaneous QHS Venetia Maxon Rama, MD   3 Units at 02/17/14 2218  . levothyroxine (SYNTHROID, LEVOTHROID) tablet 150 mcg  150 mcg Oral QAC breakfast Venetia Maxon Rama, MD   150 mcg at 02/19/14 0820  . LORazepam (ATIVAN) tablet 1 mg  1 mg Oral Q8H PRN Rise Patience, MD   1 mg at 02/12/14 2254  . morphine (MS CONTIN) 12 hr tablet 60 mg  60 mg Oral Q12H Rise Patience, MD   30 mg at 02/19/14 1033  . ondansetron (ZOFRAN) tablet 4 mg  4 mg Oral Q6H PRN Rise Patience, MD       Or  . ondansetron [facility]) injection 4 mg  4 mg Intravenous Q6H PRN Rise Patience, MD      . pantoprazole (PROTONIX) EC tablet 40 mg  40 mg Oral Daily Rise Patience, MD   40 mg at 02/19/14 1033  . QUEtiapine (SEROQUEL XR) 24 hr tablet 100 mg  100 mg Oral Daily Belkys A Regalado, MD   50 mg at 02/19/14 1033  . traZODone (DESYREL) tablet 100 mg  100 mg  Oral QHS PRN Durward Parcel, MD   100 mg at 02/18/14 2236    Psychiatric Specialty Exam: Physical Exam Full physical performed in Emergency Department. I have reviewed this assessment and concur with its findings.   Review of Systems  Musculoskeletal: Positive for back pain, myalgias and neck pain.  Psychiatric/Behavioral: Positive for depression. The patient is nervous/anxious and has insomnia.     Blood pressure 122/70, pulse 90, temperature 98 F (36.7 C), temperature source Oral, resp. rate 20, height 6\' 4"  (1.93 m), weight 85.866 kg (189 lb 4.8 oz), SpO2 99.00%.Body mass index is 23.05 kg/(m^2).  General Appearance: Guarded  Eye Contact::  Good  Speech:  Pressured  Volume:  Increased  Mood:  Anxious, Depressed and  Irritable  Affect:  Appropriate and Congruent  Thought Process:  Coherent and Goal Directed  Orientation:  Full (Time, Place, and Person)  Thought Content:  Rumination  Suicidal Thoughts:  No  Homicidal Thoughts:  No  Memory:  Immediate;   Fair  Judgement:  Impaired  Insight:  Fair  Psychomotor Activity:  Restlessness  Concentration:  Fair  Recall:  [employer] of Knowledge:Fair  Language: Good  Akathisia:  NA  Handed:  Right  AIMS (if indicated):     Assets:  Communication Skills Desire for [place] Talents/Skills Transportation  Sleep:      Musculoskeletal: Strength & Muscle Tone: within normal limits Gait & Station: broad based Patient leans: N/A  Treatment Plan Summary: Daily contact with patient to assess and evaluate symptoms and progress in treatment Medication management  Frank Ward,JANARDHAHA R. 02/19/2014 12:56 PM

## 2014-02-20 ENCOUNTER — Ambulatory Visit: Payer: Medicaid Other

## 2014-02-20 ENCOUNTER — Encounter: Payer: Self-pay | Admitting: Radiation Oncology

## 2014-02-20 LAB — GLUCOSE, CAPILLARY
GLUCOSE-CAPILLARY: 176 mg/dL — AB (ref 70–99)
Glucose-Capillary: 242 mg/dL — ABNORMAL HIGH (ref 70–99)
Glucose-Capillary: 192 mg/dL — ABNORMAL HIGH (ref 70–99)
Glucose-Capillary: 159 mg/dL — ABNORMAL HIGH (ref 70–99)

## 2014-02-20 NOTE — Progress Notes (Signed)
Weekly Management Note:  Site: T. 12-L2/sacrum Current Dose:  2700  cGy Projected Dose: 3000  cGy  Narrative: The patient is seen today for routine under treatment assessment. CBCT/MVCT images/port films were reviewed. The chart was reviewed.   He states that his pain is improved along his sacrum and also his mid back.  Physical Examination: There were no vitals filed for this visit..  Weight:  . No change.  Impression: Tolerating radiation therapy well. He'll finish his radiation therapy tomorrow.  Plan: One-month followup after completion of radiation therapy tomorrow.

## 2014-02-20 NOTE — Progress Notes (Signed)
CSW continuing to follow.  CSW and RNCM met with pt at bedside to discuss discharge planning needs.   Pt discussed that he plans to remain in [place] and stay with a friend. Pt shared that he feels that remaining in [place] will be helpful due to the need to go to his follow up appointments at the [facility]. CSW encouraged pt to maintain scheduled appointments and explained Medicaid Transportation. Pt was notified that Medicaid Transportation phone number will be located on his discharge instructions. Pt did not provide details about what his friends name is or where his friend lives. Pt did share that his brother plans to transport him home at discharge. CSW notified pt that if pt brother unable to transport, to notify CSW in order for CSW to assist. Pt expressed understanding.  CSW to continue to follow to assist as appropriate.  Alison Murray, MSW, [place] [employer] [phone]

## 2014-02-20 NOTE — Progress Notes (Signed)
Clinical Social Work  CSW went to room to meet with patient but patient out of room for procedure. CSW will follow up at later time.  [place], [place] [phone]

## 2014-02-20 NOTE — Progress Notes (Signed)
Pt was in bed when I arrived. He said he felt sleepy after taking meds 32min prior. Pt said he felt good today and will go home Sat. Pt was thankful for visit. No need for emotional/spiritual spt today. Ernest Haber Chaplain  02/20/14 1000  Clinical Encounter Type  Visited With Patient

## 2014-02-20 NOTE — Progress Notes (Signed)
Patient ID: Frank Ward, male   DOB: Feb 09, 1971, 43 y.o.   MRN: [id number] [facility] PROGRESS NOTE  Frank Ward Rachel ZCH:[id number] DOB: [birth year] DOA: 02/06/2014 PCP: No PCP Per Patient  Brief narrative: 43 y.o. 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 He is currently receiving radiation therapy which is scheduled to finish on 02/21/14.   Assessment/Plan:   Principal Problem:  Acute encephalopathy  Multifactorial with possible drug overdose responsible for his initial unresponsiveness.  CT of the head negative for acute changes except for his known metastatic disease.  Mental status remains good. Patient is oriented to time, place and person Active Problems:  Noncompliance with medications  Encouraged to be compliant with medications. Inconsistently takes Decadron Steroid-induced hyperglycemia  CBGs in past 24 hours: 218, 187, 242 He was on NovoLog 10 units 3 times a day and Lantus 20 units daily. Since he is not really taking Decadron he is only on sliding scale insulin. A1c 6.5 indicating good glycemic control. Hypothyroidism status post thyroidectomy  Continue Synthroid. Goal TSH less than 1.0. Most recent TSH 5.80, down from 27.12 approximately one month ago.  Increased Synthroid to 150 mcg as recommended by Dr. Dwyane Dee of endocrinology 01/20/14. Psychotic disorder NOS  Lacks capacity for medical decision-making. Aggressive behavior appears to be centered around obtaining pain medications, however he does have known metastatic cancer with bone  involvement, receiving active radiation treatment to sacrum and T12-L2. Has known brain metastasis.  Continue Seroquel, trazodone, and as needed Ativan.  Anemia of chronic disease  Secondary to history of malignancy. No current indications for transfusion. 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 Currently receiving radiation therapy. Radiation therapy to be completed 02/21/2014. Continue neck collar per prior neurosurgery recommendations.  Continue oral Decadron, patient encouraged to be compliant with Decadron. Continue pain management efforts with Dilaudid, Ibuprofen.  Continue Synthroid.  Palliative care consultation following patient for assistance with pain management and disposition issues. Ulcer of left dorsal foot  Wound care per wound care RN recommendations.   DVT prophylaxis: Lovenox sub Q   Code Status: DNR  Family Communication: No family at the bedside.  Disposition Plan: Home when stable.   Consultants:  Dr. Rhea Pink, Palliative care  Dr. Eppie Gibson, Radiation oncology  Dr. Durward Parcel, Psychiatry Procedures:  Right lower extremity venous Doppler 02/08/14: No DVT or SVT in the right lower extremity. Antibiotics:  None    Leisa Lenz, MD  [facility] Pager [phone]  If 7PM-7AM, please contact night-coverage [email or web address] Password [email or web address] 02/20/2014, 7:03 AM   LOS: 14 days    HPI/Subjective: No acute overnight events.  Objective: Filed Vitals:   02/18/14 2115 02/19/14 0737 02/19/14 2105 02/20/14 0512  BP: 127/77 122/70 110/64 121/74  Pulse: 98 90 80 87  Temp: 97.9 F (36.6 C) 98 F (36.7 C) 98.1 F (36.7 C) 97.7 F (36.5 C)  TempSrc: Axillary Oral Oral Oral  Resp: 20 20 18 18   Height:      Weight:      SpO2: 98% 99% 98% 98%  Intake/Output Summary (Last 24 hours) at 02/20/14 0703 Last data filed at  02/19/14 4665  Gross per 24 hour  Intake    600 ml  Output      0 ml  Net    600 ml    Exam:   General:  Pt is alert, follows commands appropriately, not in acute distress  Cardiovascular: Regular rate and rhythm, [name], no murmurs  Respiratory: Clear to auscultation bilaterally, no wheezing, no crackles, no rhonchi  Abdomen: Soft, non tender, non distended, bowel sounds present  Extremities: No edema, pulses DP and PT palpable bilaterally  Neuro: Grossly nonfocal  Data Reviewed: Basic Metabolic Panel:  Recent Labs Lab 02/14/14 0433 02/16/14 0425  NA  --  140  K  --  3.9  CL  --  103  CO2  --  24  GLUCOSE  --  110*  BUN  --  16  CREATININE 0.77 0.75  CALCIUM  --  8.5   Liver Function Tests: No results found for this basename: AST, ALT, ALKPHOS, BILITOT, PROT, ALBUMIN,  in the last 168 hours No results found for this basename: LIPASE, AMYLASE,  in the last 168 hours No results found for this basename: AMMONIA,  in the last 168 hours CBC: No results found for this basename: WBC, NEUTROABS, HGB, HCT, MCV, PLT,  in the last 168 hours Cardiac Enzymes: No results found for this basename: CKTOTAL, CKMB, CKMBINDEX, TROPONINI,  in the last 168 hours BNP: No components found with this basename: POCBNP,  CBG:  Recent Labs Lab 02/18/14 2124 02/19/14 0719 02/19/14 1203 02/19/14 1815 02/19/14 2104  GLUCAP 144* 199* 205* 218* 187*    No results found for this or any previous visit (from the past 240 hour(s)).   Studies: No results found.  Scheduled Meds: . dexamethasone  4 mg Oral 4 times per day  . enoxaparin (LOVENOX)   40 mg Subcutaneous Q24H  . furosemide  20 mg Oral Daily  . insulin aspart  0-20 Units Subcutaneous TID WC  . insulin aspart  0-5 Units Subcutaneous QHS  . levothyroxine  150 mcg Oral QAC breakfast  . morphine  60 mg Oral Q12H  . pantoprazole  40 mg Oral Daily  . QUEtiapine  100 mg Oral Daily

## 2014-02-20 NOTE — Progress Notes (Signed)
[facility] Therapy Treatment Record Phone [phone]   Radiation Therapy was administered to Frank Ward on: 02/20/2014  1:47 PM and was treatment # 9 out of a planned course of 10 treatments.

## 2014-02-21 ENCOUNTER — Ambulatory Visit: Payer: Medicaid Other

## 2014-02-21 ENCOUNTER — Ambulatory Visit
Admit: 2014-02-21 | Discharge: 2014-02-21 | Disposition: A | Discharge status: Home / Self Care | Payer: Medicaid Other | Attending: Radiation Oncology | Admitting: Radiation Oncology

## 2014-02-21 LAB — GLUCOSE, CAPILLARY
GLUCOSE-CAPILLARY: 192 mg/dL — AB (ref 70–99)
GLUCOSE-CAPILLARY: 177 mg/dL — AB (ref 70–99)
Glucose-Capillary: 161 mg/dL — ABNORMAL HIGH (ref 70–99)
Glucose-Capillary: 225 mg/dL — ABNORMAL HIGH (ref 70–99)

## 2014-02-21 LAB — CREATININE, SERUM: Creatinine, Ser: 0.84 mg/dL (ref 0.50–1.35)

## 2014-02-21 NOTE — Progress Notes (Addendum)
Patient ID: Frank Ward, male   DOB: January 20, 1971, 43 y.o.   MRN: [id number] [facility] PROGRESS NOTE  Frank Ward [place] GYI:[id number] DOB: July 13, 1971 DOA: 02/06/2014 PCP: No PCP Per Patient  Brief narrative: 43 y.o. 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 He is currently receiving radiation therapy which is scheduled to finish on 02/21/14.   Assessment/Plan:   Principal Problem:  Acute encephalopathy  Multifactorial with possible drug overdose responsible for his initial unresponsiveness.  CT of the head negative for acute changes except for his known metastatic disease.  Mental status remains good. Psychiatrically,. Active Problems:  Noncompliance with medications  Encouraged to be compliant with medications. Inconsistently takes Decadron, Seroquel Steroid-induced hyperglycemia  He was on NovoLog 10 units 3 times a day and Lantus 20 units daily. Since he is not really taking Decadron he is only on sliding scale insulin.  A1c 6.5 indicating good glycemic control. Hypothyroidism status post thyroidectomy  Continue Synthroid. Goal TSH less than 1.0. Most recent TSH 5.80, down from 27.12 approximately one month ago.  Increased Synthroid to 150 mcg as recommended by Dr. Dwyane Dee of endocrinology 01/20/14. Psychotic disorder NOS  Lacks capacity for medical decision-making. Aggressive behavior appears to be centered around obtaining pain medications, however he does have known metastatic cancer with bone involvement, receiving active radiation treatment to sacrum  and T12-L2. Has known brain metastasis.  Continue Seroquel, trazodone, and as needed Ativan. As mentioned, takes medications very inconsistently. Anemia of chronic disease  Secondary to history of malignancy. No current indications for transfusion. 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 Currently receiving radiation therapy. Radiation therapy to be completed 02/21/2014.  Continue neck collar per prior neurosurgery recommendations.  Continue oral Decadron, patient encouraged to be compliant with Decadron.  Continue pain management efforts with Dilaudid, Ibuprofen.  Continue Synthroid.  Palliative care consultation following patient for assistance with pain management and disposition issues. Ulcer of left dorsal foot  Wound care per wound care RN recommendations.   DVT prophylaxis: Lovenox sub Q while patient is in hospital   Code Status: DNR  Family Communication: No family at the bedside.  Disposition Plan: Home when stable, in am  Consultants:  Dr. Rhea Pink, Palliative care  Dr. Eppie Gibson, Radiation oncology  Dr. Durward Parcel, Psychiatry Procedures:  Right lower extremity venous Doppler 02/08/14: No DVT or SVT in the right lower extremity. Antibiotics:  None    Frank Lenz, MD  [facility] Pager [phone]  If 7PM-7AM, please contact night-coverage [email or web address] Password [email or web address] 02/21/2014, 10:48 AM   LOS: 15 days    HPI/Subjective: No acute overnight events.  Objective: Filed Vitals:   02/19/14 2105 02/20/14 0512 02/20/14 2108 02/21/14 0605  BP: 110/64 121/74 109/58 109/61  Pulse: 80 87 95 90  Temp: 98.1 F (36.7 C) 97.7 F (36.5 C) 98.2 F (36.8 C) 97.9 F (36.6 C)  TempSrc: Oral Oral Oral Oral  Resp: 18 18 18 20   Height:      Weight:      SpO2: 98% 98% 98% 99%   No  intake or output data in the 24 hours ending  02/21/14 1048  Exam:   General:  Pt is alert, follows commands appropriately, not in acute distress  Cardiovascular: Regular rate and rhythm, [name], no murmurs  Respiratory: Clear to auscultation bilaterally, no wheezing, no crackles, no rhonchi  Abdomen: Soft, non tender, non distended, bowel sounds present  Extremities: No edema, pulses DP and PT palpable bilaterally  Neuro: Grossly nonfocal  Data Reviewed: Basic Metabolic Panel:  Recent Labs Lab 02/16/14 0425 02/21/14 0403  NA 140  --   K 3.9  --   CL 103  --   CO2 24  --   GLUCOSE 110*  --   BUN 16  --   CREATININE 0.75 0.84  CALCIUM 8.5  --    Liver Function Tests: No results found for this basename: AST, ALT, ALKPHOS, BILITOT, PROT, ALBUMIN,  in the last 168 hours No results found for this basename: LIPASE, AMYLASE,  in the last 168 hours No results found for this basename: AMMONIA,  in the last 168 hours CBC: No results found for this basename: WBC, NEUTROABS, HGB, HCT, MCV, PLT,  in the last 168 hours Cardiac Enzymes: No results found for this basename: CKTOTAL, CKMB, CKMBINDEX, TROPONINI,  in the last 168 hours BNP: No components found with this basename: POCBNP,  CBG:  Recent Labs Lab 02/20/14 0803 02/20/14 1206 02/20/14 1640 02/20/14 2018 02/21/14 0754  GLUCAP 242* 176* 192* 159* 192*    No results found for this or any previous visit (from the past 240 hour(s)).   Studies: No results found.  Scheduled Meds: . dexamethasone  4 mg Oral 4 times per day  . enoxaparin (LOVENOX) injection  40 mg Subcutaneous Q24H  . furosemide  20 mg Oral Daily  . insulin aspart  0-20 Units Subcutaneous TID WC  . insulin aspart  0-5 Units Subcutaneous QHS  . levothyroxine  150 mcg Oral QAC breakfast  . morphine  60 mg Oral Q12H  . pantoprazole  40 mg Oral Daily  . QUEtiapine  100 mg Oral Daily   Continuous Infusions:

## 2014-02-21 NOTE — Progress Notes (Signed)
Brief visit w/pt as he was about to go to radiation. He said he was ok today. Ernest Haber Chaplain

## 2014-02-21 NOTE — Progress Notes (Addendum)
CSW continuing to follow.  CSW received voice message from [name], Frank Ward this morning.  CSW returned phone call and left message with [name], Frank Ward.  CSW to continue to follow.  Addendum 3:00 pm:   CSW was able to reach [name], Frank Ward via telephone.  [name] discussed that pt mother had expressed frustration to pt aunt as per pt aunt report, pt mother was present at bedside and was told that she could not received information due to pt mother not being HCPOA. Per aunt/HCPOA, Frank Ward, she initially considered revoking her HCPOA for pt, but wants to remain a support for pt and assist in pt decisions. [name] is agreeable to pt mother being involved in treatment plan as well and wanted to ensure that treatment team was aware. CSW provided pt aunt support and discussed with pt aunt that CSW will place note in chart to notify treatment team that [name] agreeable to pt mother being involved in treatment plan. CSW notified pt aunt that pt discharge is scheduled for tomorrow and pt reports that pt brother will provide transportation.  Pt discharge tomorrow and per pt report, pt brother to provide transportation.  Please notify weekend social worker if transportation needs arise.  CSW to continue to follow to provide assistance as needed until pt discharge.  Alison Murray, MSW, [facility] [phone]

## 2014-02-22 ENCOUNTER — Encounter: Payer: Self-pay | Admitting: Radiation Oncology

## 2014-02-22 DIAGNOSIS — C801 Malignant (primary) neoplasm, unspecified: Secondary | ICD-10-CM

## 2014-02-22 LAB — GLUCOSE, CAPILLARY
GLUCOSE-CAPILLARY: 115 mg/dL — AB (ref 70–99)
Glucose-Capillary: 197 mg/dL — ABNORMAL HIGH (ref 70–99)

## 2014-02-22 MED ORDER — QUETIAPINE FUMARATE ER 50 MG PO TB24: 100.0000 mg | ORAL_TABLET | Freq: Every day | ORAL | Status: AC

## 2014-02-22 MED ORDER — MORPHINE SULFATE ER 60 MG PO TBCR: 60.0000 mg | EXTENDED_RELEASE_TABLET | Freq: Two times a day (BID) | ORAL | Status: DC

## 2014-02-22 MED ORDER — DEXAMETHASONE 4 MG PO TABS: 4.0000 mg | ORAL_TABLET | Freq: Two times a day (BID) | ORAL | Status: DC

## 2014-02-22 MED ORDER — HYDROMORPHONE HCL 4 MG PO TABS: 4.0000 mg | ORAL_TABLET | ORAL | Status: DC | PRN

## 2014-02-22 MED ORDER — HYDROMORPHONE HCL PF 2 MG/ML IJ SOLN
2.0000 mg | Freq: Once | INTRAMUSCULAR | Status: AC
  Administered 2014-02-22: 2 mg via INTRAVENOUS

## 2014-02-22 MED ORDER — LORAZEPAM 1 MG PO TABS: 1.0000 mg | ORAL_TABLET | Freq: Three times a day (TID) | ORAL | Status: DC | PRN

## 2014-02-22 MED ORDER — PANTOPRAZOLE SODIUM 40 MG PO TBEC: 40.0000 mg | DELAYED_RELEASE_TABLET | Freq: Every day | ORAL | Status: DC

## 2014-02-22 MED ORDER — TRAZODONE HCL 100 MG PO TABS: 100.0000 mg | ORAL_TABLET | Freq: Every evening | ORAL | Status: DC | PRN

## 2014-02-22 NOTE — Progress Notes (Addendum)
[facility] End of Treatment Note  Name:Alvon Baylor Cortez  Date: 02/22/2014 ZHG:[id number] DOB:[birth year]   Status:outpatient    CC: No PCP Per Patient  Dr. Concha Norway  REFERRING PHYSICIAN:  Dr. 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 He states that his back and sacral pain were improved by completion of therapy.  More importantly, his pain regimen was fine-tuned while he was an inpatient during his treatment.                         PLAN: Routine followup in one month. Patient instructed to call if questions or worsening complaints in interim.

## 2014-02-22 NOTE — Discharge Summary (Signed)
Physician Discharge Summary  Frank Ward PZW:[id number] DOB: 07-09-1971 DOA: 02/06/2014  PCP: No PCP Per Patient  Admit date: 02/06/2014 Discharge date: 02/22/2014  Recommendations for Outpatient Follow-up:  1. Follow up in cancer center per scheduled appt 2. Prescriptions provided for pain medications. Pt reported he did not have any left. Only filled were short acting, MS contin not filled.  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 male with a PMH of metastatic papillary thyroid cancer (metastasis to the spine, brain, and lungs), recent hospitalization 01/07/14-01/27/14 with hospital course complicated by significant behavioral disturbance status post psychiatry evaluation and found to lack capacity, discharged home but failed to followup as scheduled for radiation treatment, admitted to an outside hospital after being found down 02/06/14, transferred back to [facility] for further evaluation and care. He is currently receiving radiation therapy which was completed on 02/21/14.   Assessment/Plan:   Principal Problem:  Acute encephalopathy  Multifactorial with possible drug overdose responsible for his initial unresponsiveness.  CT of the head negative for acute changes except for his known metastatic disease.  Mental status remains good. Psychiatrically stable. Active Problems:  Noncompliance with medications  Encouraged to be  compliant with medications. Inconsistently takes Decadron, Seroquel Steroid-induced hyperglycemia  He was on NovoLog 10 units 3 times a day and Lantus 20 units daily. Since he is not really taking Decadron he is only on sliding scale insulin while in hospital. He reported he will see if he will use insulin on discharge, so far not inclined to be compliant. A1c 6.5 indicating good glycemic control. Hypothyroidism status post thyroidectomy  Continue Synthroid. Goal TSH less than 1.0. Most recent TSH 5.80, down from 27.12 approximately one month ago.  Increased Synthroid to 150 mcg as recommended by Dr. Dwyane Dee of endocrinology 01/20/14. Psychotic disorder NOS  Lacks capacity for medical decision-making. Aggressive behavior appears to be centered around obtaining pain medications, however he does have known metastatic cancer with bone involvement, receiving active radiation treatment to sacrum and T12-L2. Has known brain metastasis.  Continue Seroquel, trazodone, and as needed Ativan. As mentioned, takes medications very inconsistently. Anemia of chronic disease  Secondary to history of malignancy. No current indications for transfusion. 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 Currently receiving radiation therapy. Radiation therapy to be completed 02/21/2014.  Continue neck collar per prior neurosurgery recommendations.  Continue oral Decadron, patient encouraged to be compliant with Decadron.  Continue pain management efforts with Dilaudid, Ibuprofen.  Continue Synthroid.  Palliative care consultation following patient for assistance with pain management and disposition issues. Ulcer of left dorsal foot  Wound care per wound care RN recommendations.   DVT prophylaxis: Lovenox sub Q while patient is in hospital   Code Status: DNR  Family Communication: No family at the bedside.    Consultants:  Dr. Eustaquio Maize  Hilma Favors, Palliative care  Dr. Eppie Gibson, Radiation oncology  Dr. Durward Parcel, Psychiatry Procedures:  Right lower extremity venous Doppler 02/08/14: No DVT or SVT in the right lower extremity. Antibiotics:  None   Signed:  Leisa Lenz, MD  [facility] 02/22/2014, 12:12 PM  Pager #: [phone]   Discharge Exam: Filed Vitals:   02/22/14 0500  BP: 110/65  Pulse: 105  Temp: 98.1 F (36.7 C)  Resp: 18   Filed Vitals:   02/20/14 0512 02/20/14 2108 02/21/14 0605 02/22/14 0500  BP: 121/74 109/58 109/61 110/65  Pulse: 87 95 90 105  Temp: 97.7 F (36.5 C) 98.2 F (36.8 C) 97.9 F (36.6 C) 98.1 F (36.7 C)  TempSrc: Oral Oral Oral Oral  Resp: 18 18 20 18   Height:      Weight:      SpO2: 98% 98% 99% 96%    General: Pt is alert, follows commands appropriately, not in acute distress Cardiovascular: Regular rate and rhythm, [name] +, no murmurs Respiratory: Clear to auscultation bilaterally, no wheezing, no crackles, no rhonchi Abdominal: Soft, non tender, non distended, bowel sounds +, no guarding Extremities: no edema, no cyanosis, pulses palpable bilaterally DP and PT Neuro: Grossly nonfocal  Discharge Instructions  Discharge Instructions   Call MD for:  difficulty breathing, headache or visual disturbances    Complete by:  As directed      Call MD for:  persistant dizziness or light-headedness    Complete by:  As directed      Call MD for:  persistant nausea and vomiting    Complete by:  As directed      Call MD for:  severe uncontrolled pain    Complete by:  As directed      Diet - low sodium heart healthy    Complete by:  As directed      Increase activity slowly    Complete by:  As directed             Medication List    STOP taking these medications       clindamycin 300 MG capsule  Commonly known as:  CLEOCIN      TAKE these medications       dexamethasone 4 MG tablet  Commonly known as:   DECADRON  Take 1 tablet (4 mg total) by mouth 2 (two) times daily.     docusate sodium 100 MG capsule  Commonly known as:  COLACE  Take 100 mg by mouth 2 (two) times daily as needed for mild constipation.     furosemide 20 MG tablet  Commonly known as:  LASIX  Take 1 tablet (20 mg total) by mouth daily.     HYDROmorphone 4 MG tablet  Commonly known as:  DILAUDID  Take 1 tablet (4 mg total) by mouth every 4 (four) hours as needed for severe pain.     ibuprofen 200 MG tablet  Commonly known as:  ADVIL,MOTRIN  Take 200-400 mg by mouth every 6 (six) hours as needed for mild pain.     levothyroxine 100 MCG tablet  Commonly known as:  SYNTHROID, LEVOTHROID  Take 100 mcg by mouth daily before breakfast.     LORazepam 1 MG tablet  Commonly known as:  ATIVAN  Take 1 tablet (1 mg total) by mouth every 8 (eight) hours as needed for anxiety.     morphine 60 MG 12 hr tablet  Commonly known as:  MS CONTIN  Take 1 tablet (60  mg total) by mouth every 12 (twelve) hours.     pantoprazole 40 MG tablet  Commonly known as:  PROTONIX  Take 1 tablet (40 mg total) by mouth daily.     QUEtiapine 50 MG Tb24 24 hr tablet  Commonly known as:  SEROQUEL XR  Take 2 tablets (100 mg total) by mouth daily.     traZODone 100 MG tablet  Commonly known as:  DESYREL  Take 1 tablet (100 mg total) by mouth at bedtime as needed for sleep.           Follow-up Information   Follow up with [employer]. (Call and request transportation for all your medical appts.)    Contact information:   [phone]      Follow up with [facility]     On 02/28/2014. (Follow up appt after recent hospitalization  at 10 am)    Contact information:   [place] [id number] [phone]       The results of significant diagnostics from this hospitalization (including imaging, microbiology, ancillary and laboratory) are listed below for reference.     Significant Diagnostic Studies: Dg Chest 2 View  02/06/2014   CLINICAL DATA:  Altered mental status.  EXAM: CHEST  2 VIEW  COMPARISON:  12/09/2013  FINDINGS: Normal heart size and pulmonary vascularity. Multiple diffuse metastases throughout the lungs, similar to prior study. No developing airspace disease or consolidation. No blunting of costophrenic angles. No pneumothorax. Destructive bone lesion in the right scapula is probably metastatic.  IMPRESSION: Diffuse bilateral pulmonary metastases. Destructive bone metastasis in the right scapula.   Electronically Signed   By: Lucienne Capers M.D.   On: 02/06/2014 23:03   Ct Head Wo Contrast  02/06/2014   CLINICAL DATA:  Headaches and confusion.  EXAM: CT HEAD WITHOUT CONTRAST  TECHNIQUE: Contiguous axial images were obtained from the base of the skull through the vertex without intravenous contrast.  COMPARISON:  CT head 01/16/2013.  MRI brain 12/26/2013.  FINDINGS: 1.3 cm diameter mass lesion in the fourth ventricle consistent with metastatic disease is enlarged since previous CT scan but appears stable since the prior MRI. Tiny additional nodular lesions demonstrated on the MRI are not appreciable on CT. Ventricles and sulci appear otherwise symmetrical. No significant ventricular dilatation. No abnormal extra-axial fluid collections. Gray-white matter junctions are distinct. Basal cisterns are not effaced. No evidence of acute intracranial hemorrhage. Calvarium appears intact. Opacification of the right frontal sinus with mucosal thickening in the maxillary antra bilaterally. Mastoid air cells are not opacified.  IMPRESSION: Re identification of known mass in the fourth ventricle. No acute intracranial abnormalities are suspected. Chronic inflammatory changes in the sinuses.   Electronically Signed   By: Lucienne Capers M.D.   On: 02/06/2014 23:38    Microbiology: No results found for this or any previous visit (from the past 240 hour(s)).    Labs: Basic Metabolic Panel:  Recent Labs Lab 02/16/14 0425 02/21/14 0403  NA 140  --   K 3.9  --   CL 103  --   CO2 24  --   GLUCOSE 110*  --   BUN 16  --   CREATININE 0.75 0.84  CALCIUM 8.5  --    Liver Function Tests: No results found for this basename: AST, ALT, ALKPHOS, BILITOT, PROT, ALBUMIN,  in the last 168 hours No results found for this basename: LIPASE, AMYLASE,  in the last 168 hours No results found  for this basename: AMMONIA,  in the last 168 hours CBC: No results found for this basename: WBC, NEUTROABS, HGB, HCT, MCV, PLT,  in the last 168 hours Cardiac Enzymes: No results found for this basename: CKTOTAL, CKMB, CKMBINDEX, TROPONINI,  in the last 168 hours BNP: BNP (last 3 results) No results found for this basename: PROBNP,  in the last 8760 hours CBG:  Recent Labs Lab 02/21/14 1152 02/21/14 1605 02/21/14 2149 02/22/14 0746 02/22/14 1137  GLUCAP 177* 161* 225* 197* 115*    Time coordinating discharge: Over 30 minutes

## 2014-02-22 NOTE — Progress Notes (Signed)
Special treatment procedure note: The patient required extensive planning for his treatment to the lower thoracic spine because of previous radiation therapy at an outside facility. His outside studies were obtained in his treatment was reconstructed requiring extra treatment planning. He began his radiotherapy on June 26.

## 2014-02-22 NOTE — Discharge Instructions (Signed)
°Musculoskeletal Pain °Musculoskeletal pain is muscle and boney aches and pains. These pains can occur in any part of the body. Your caregiver may treat you without knowing the cause of the pain. They may treat you if blood or urine tests, X-rays, and other tests were normal.  °CAUSES °There is often not a definite cause or reason for these pains. These pains may be caused by a type of germ (virus). The discomfort may also come from overuse. Overuse includes working out too hard when your body is not fit. Boney aches also come from weather changes. Bone is sensitive to atmospheric pressure changes. °HOME CARE INSTRUCTIONS  °· Ask when your test results will be ready. Make sure you get your test results. °· Only take over-the-counter or prescription medicines for pain, discomfort, or fever as directed by your caregiver. If you were given medications for your condition, do not drive, operate machinery or power tools, or sign legal documents for 24 hours. Do not drink alcohol. Do not take sleeping pills or other medications that may interfere with treatment. °· Continue all activities unless the activities cause more pain. When the pain lessens, slowly resume normal activities. Gradually increase the intensity and duration of the activities or exercise. °· During periods of severe pain, bed rest may be helpful. Lay or sit in any position that is comfortable. °· Putting ice on the injured area. °¨ Put ice in a bag. °¨ Place a towel between your skin and the bag. °¨ Leave the ice on for 15 to 20 minutes, 3 to 4 times a day. °· Follow up with your caregiver for continued problems and no reason can be found for the pain. If the pain becomes worse or does not go away, it may be necessary to repeat tests or do additional testing. Your caregiver may need to look further for a possible cause. °SEEK IMMEDIATE MEDICAL CARE IF: °· You have pain that is getting worse and is not relieved by medications. °· You develop chest pain  that is associated with shortness or breath, sweating, feeling sick to your stomach (nauseous), or throw up (vomit). °· Your pain becomes localized to the abdomen. °· You develop any new symptoms that seem different or that concern you. °MAKE SURE YOU:  °· Understand these instructions. °· Will watch your condition. °· Will get help right away if you are not doing well or get worse. °Document Released: 08/01/2005 Document Revised: 10/24/2011 Document Reviewed: 04/05/2013 °ExitCare® Patient Information ©2015 [employer]. This information is not intended to replace advice given to you by your health care provider. Make sure you discuss any questions you have with your health care provider. °Muscle Pain °Muscle pain (myalgia) may be caused by many things, including: °· Overuse or muscle strain, especially if you are not in shape. This is the most common cause of muscle pain. °· Injury. °· Bruises. °· Viruses, such as the flu. °· Infectious diseases. °· Fibromyalgia, which is a chronic condition that causes muscle tenderness, fatigue, and headache. °· Autoimmune diseases, including lupus. °· Certain drugs, including ACE inhibitors and statins. °Muscle pain may be mild or severe. In most cases, the pain lasts only a short time and goes away without treatment. To diagnose the cause of your muscle pain, your health care provider will take your medical history. This means he or she will ask you when your muscle pain began and what has been happening. If you have not had muscle pain for very long, your health care   provider may want to wait before doing much testing. If your muscle pain has lasted a long time, your health care provider may want to run tests right away. If your health care provider thinks your muscle pain may be caused by illness, you may need to have additional tests to rule out certain conditions.  °Treatment for muscle pain depends on the cause. Home care is often enough to relieve muscle pain. Your  health care provider may also prescribe anti-inflammatory medicine. °HOME CARE INSTRUCTIONS °Watch your condition for any changes. The following actions may help to lessen any discomfort you are feeling: °· Only take over-the-counter or prescription medicines as directed by your health care provider. °· Apply ice to the sore muscle: °· Put ice in a plastic bag. °· Place a towel between your skin and the bag. °· Leave the ice on for 15-20 minutes, 3-4 times a day. °· You may alternate applying hot and cold packs to the muscle as directed by your health care provider. °· If overuse is causing your muscle pain, slow down your activities until the pain goes away. °· Remember that it is normal to feel some muscle pain after starting a workout program. Muscles that have not been used often will be sore at first. °· Do regular, gentle exercises if you are not usually active. °· Warm up before exercising to lower your risk of muscle pain. °· Do not continue working out if the pain is very bad. Bad pain could mean you have injured a muscle. °SEEK MEDICAL CARE IF: °· Your muscle pain gets worse, and medicines do not help. °· You have muscle pain that lasts longer than 3 days. °· You have a rash or fever along with muscle pain. °· You have muscle pain after a tick bite. °· You have muscle pain while working out, even though you are in good physical condition. °· You have redness, soreness, or swelling along with muscle pain. °· You have muscle pain after starting a new medicine or changing the dose of a medicine. °SEEK IMMEDIATE MEDICAL CARE IF: °· You have trouble breathing. °· You have trouble swallowing. °· You have muscle pain along with a stiff neck, fever, and vomiting. °· You have severe muscle weakness or cannot move part of your body. °MAKE SURE YOU:  °· Understand these instructions. °· Will watch your condition. °· Will get help right away if you are not doing well or get worse. °Document Released: 06/23/2006  Document Revised: 08/06/2013 Document Reviewed: 05/28/2013 °ExitCare® Patient Information ©2015 [employer]. This information is not intended to replace advice given to you by your health care provider. Make sure you discuss any questions you have with your health care provider. ° °

## 2014-02-22 NOTE — Progress Notes (Signed)
Patient discharged to home, discharge instructions reviewed with patient who verbalized understanding, new RX's given to patient. Patient very disgruntled about being discharged, insisted one "one more" pain shot before he left the hospital, Dr Charlies Silvers called and one time order given and medication administered to patient. Patients brother came to pick patient up and left because patient was so ugly to him about leaving the hospital.

## 2014-02-28 ENCOUNTER — Inpatient Hospital Stay: Payer: Self-pay

## 2014-03-25 ENCOUNTER — Encounter: Payer: Self-pay | Admitting: *Deleted

## 2014-03-27 ENCOUNTER — Encounter: Payer: Self-pay | Admitting: *Deleted

## 2014-03-27 ENCOUNTER — Other Ambulatory Visit: Payer: Self-pay | Admitting: Radiation Oncology

## 2014-03-27 MED ORDER — MORPHINE SULFATE ER 60 MG PO TBCR: 60.0000 mg | EXTENDED_RELEASE_TABLET | Freq: Two times a day (BID) | ORAL | Status: DC

## 2014-03-27 MED ORDER — HYDROMORPHONE HCL 4 MG PO TABS: 4.0000 mg | ORAL_TABLET | ORAL | Status: DC | PRN

## 2014-03-27 MED ORDER — LORAZEPAM 1 MG PO TABS: 1.0000 mg | ORAL_TABLET | Freq: Three times a day (TID) | ORAL | Status: DC | PRN

## 2014-03-27 NOTE — Progress Notes (Signed)
Frank Ward come to rad onc today requesting refills on MS Contin, Dilaudid and Ativan. He last received refills on 02/22/14 per a hospitalist in [facility] ED. He has a 1 month FU with Frank Ward, Aug 18 th but did not have enough meds to last till then. He had 8 tabs of Dilaudid but no MS Contin or Ativan remaining in his bottles. Frank Ward refilled these medications for him. I reminded Frank Ward of his FU next week. He verbalized appreciation for the refills and understanding of his next appointment date/time.

## 2014-04-01 ENCOUNTER — Ambulatory Visit: Payer: Medicaid Other | Attending: Radiation Oncology | Admitting: Radiation Oncology

## 2014-04-07 ENCOUNTER — Encounter: Payer: Self-pay | Admitting: *Deleted

## 2014-04-09 ENCOUNTER — Other Ambulatory Visit: Payer: Self-pay

## 2014-04-11 ENCOUNTER — Encounter (HOSPITAL_COMMUNITY): Payer: Self-pay | Admitting: Emergency Medicine

## 2014-04-11 ENCOUNTER — Emergency Department (HOSPITAL_COMMUNITY)
Admission: EM | Admit: 2014-04-11 | Discharge: 2014-04-11 | Disposition: A | Discharge status: Home / Self Care | Payer: Medicaid Other | Attending: Emergency Medicine | Admitting: Emergency Medicine

## 2014-04-11 DIAGNOSIS — Z85850 Personal history of malignant neoplasm of thyroid: Secondary | ICD-10-CM | POA: Diagnosis not present

## 2014-04-11 DIAGNOSIS — Z8701 Personal history of pneumonia (recurrent): Secondary | ICD-10-CM | POA: Diagnosis not present

## 2014-04-11 DIAGNOSIS — G893 Neoplasm related pain (acute) (chronic): Secondary | ICD-10-CM

## 2014-04-11 DIAGNOSIS — Z880 Allergy status to penicillin: Secondary | ICD-10-CM | POA: Insufficient documentation

## 2014-04-11 DIAGNOSIS — Z862 Personal history of diseases of the blood and blood-forming organs and certain disorders involving the immune mechanism: Secondary | ICD-10-CM | POA: Diagnosis not present

## 2014-04-11 DIAGNOSIS — Z8639 Personal history of other endocrine, nutritional and metabolic disease: Secondary | ICD-10-CM | POA: Insufficient documentation

## 2014-04-11 DIAGNOSIS — Z91040 Latex allergy status: Secondary | ICD-10-CM | POA: Diagnosis not present

## 2014-04-11 DIAGNOSIS — E0789 Other specified disorders of thyroid: Secondary | ICD-10-CM | POA: Insufficient documentation

## 2014-04-11 DIAGNOSIS — Z79899 Other long term (current) drug therapy: Secondary | ICD-10-CM | POA: Diagnosis not present

## 2014-04-11 DIAGNOSIS — Z87891 Personal history of nicotine dependence: Secondary | ICD-10-CM | POA: Diagnosis not present

## 2014-04-11 DIAGNOSIS — Z760 Encounter for issue of repeat prescription: Secondary | ICD-10-CM | POA: Diagnosis present

## 2014-04-11 MED ORDER — MORPHINE SULFATE ER 60 MG PO TBCR: 60.0000 mg | EXTENDED_RELEASE_TABLET | Freq: Two times a day (BID) | ORAL | Status: DC

## 2014-04-11 MED ORDER — HYDROMORPHONE HCL 4 MG PO TABS: 4.0000 mg | ORAL_TABLET | ORAL | Status: DC | PRN

## 2014-04-11 MED ORDER — HYDROMORPHONE HCL 2 MG PO TABS
4.0000 mg | ORAL_TABLET | Freq: Once | ORAL | Status: AC
  Administered 2014-04-11: 4 mg via ORAL
  Filled 2014-04-11: qty 2

## 2014-04-11 MED ORDER — TRAZODONE HCL 100 MG PO TABS: 100.0000 mg | ORAL_TABLET | Freq: Every day | ORAL | Status: DC

## 2014-04-11 NOTE — ED Provider Notes (Signed)
CSN: [id number]     Arrival date & time 04/11/14  1323 History   This chart was scribed for non-physician practitioner, Jeannett Senior, PA-C, working with Arbie Cookey, *, by Erling Conte, ED Scribe. This patient was seen in room WTR6/WTR6 and the patient's care was started at 3:44 PM.    Chief Complaint  Patient presents with  . Medication Refill     The history is provided by the patient. No language interpreter was used.   HPI Comments: Frank Ward is a 43 y.o. male with a h/o thyroid cancer, thyroid disease, HTN, and pneumonia who presents to the Emergency Department for a refill of his morphine, dilaudid and trazodone prescriptions. He also states he does not have any insulin. He notes that his BS today was 43 but it usually runs in the 200s. 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 He notes that his doctor is out of town until Tuesday. He was referred to another doctor who states that he was unable to refill his medication. He admits the nurse told him that he should come here. He denies any nausea, emesis, or any other complaints at this time.   Past Medical History  Diagnosis Date  . Cancer     Thyroid  . Thyroid disease   . Hypertension   . Pneumonia   . Hx of radiation therapy 02/07/14- 02/21/14    T12-L2, sacrum 3000 cGy 10 sessions  . Hx of radiation therapy 09/04/13-09/24/13    whole brain, C7-T3, upper C spine, right scapulae   Past Surgical History  Procedure Laterality Date  . Tendon repair    . Thyroidectomy     Family History  Problem Relation Age of Onset  . Diabetes Mother   . Diabetes Brother    History  Substance Use Topics  . Smoking status: Former Smoker -- 10 years    Types: Cigarettes    Quit date: 08/29/2011  . Smokeless tobacco: Never Used  . Alcohol Use: 3.0 oz/week    5 Cans of beer per week     Comment: occ    Review of Systems  Musculoskeletal: Positive for arthralgias, back  pain and neck pain.  All other systems reviewed and are negative.     Allergies  Penicillins; Latex; and Morphine and related  Home Medications   Prior to Admission medications   Medication Sig Start Date End Date Taking? Authorizing Provider  dexamethasone (DECADRON) 4 MG tablet Take 1 tablet (4 mg total) by mouth 2 (two) times daily. 02/22/14   Robbie Lis, MD  docusate sodium (COLACE) 100 MG capsule Take 100 mg by mouth 2 (two) times daily as needed for mild constipation.    Historical Provider, MD  furosemide (LASIX) 20 MG tablet Take 1 tablet (20 mg total) by mouth daily. 01/27/14   Theodis Blaze, MD  HYDROmorphone (DILAUDID) 4 MG tablet Take 1 tablet (4 mg total) by mouth every 4 (four) hours as needed for severe pain. 03/27/14   Thea Silversmith, MD  ibuprofen (ADVIL,MOTRIN) 200 MG tablet Take 200-400 mg by mouth every 6 (six) hours as needed for mild pain.    Historical Provider, MD  levothyroxine (SYNTHROID, LEVOTHROID) 100 MCG tablet Take 100 mcg by mouth daily before breakfast.     Historical Provider, MD  LORazepam (ATIVAN) 1 MG tablet Take 1 tablet (1 mg total) by mouth every 8 (eight) hours as needed for anxiety. 03/27/14  Thea Silversmith, MD  morphine (MS CONTIN) 60 MG 12 hr tablet Take 1 tablet (60 mg total) by mouth every 12 (twelve) hours. 03/27/14   Thea Silversmith, MD  pantoprazole (PROTONIX) 40 MG tablet Take 1 tablet (40 mg total) by mouth daily. 02/22/14   Robbie Lis, MD  QUEtiapine (SEROQUEL XR) 50 MG TB24 24 hr tablet Take 2 tablets (100 mg total) by mouth daily. 02/22/14   Robbie Lis, MD  traZODone (DESYREL) 100 MG tablet Take 1 tablet (100 mg total) by mouth at bedtime as needed for sleep. 02/22/14   Robbie Lis, MD   Triage Vitals: BP 126/85  Pulse 79  Temp(Src) 97.9 F (36.6 C) (Oral)  Resp 16  SpO2 98%  Physical Exam  Nursing note and vitals reviewed. Constitutional: He is oriented to person, place, and time. He appears well-developed and  well-nourished. No distress.  HENT:  Head: Normocephalic and atraumatic.  Eyes: Conjunctivae and EOM are normal.  Neck: Neck supple. No tracheal deviation present.  Cardiovascular: Normal rate.   Pulmonary/Chest: Effort normal. No respiratory distress.  Musculoskeletal: Normal range of motion.  Neurological: He is alert and oriented to person, place, and time.  Skin: Skin is warm and dry.  Psychiatric: He has a normal mood and affect. His behavior is normal.    ED Course  Procedures (including critical care time)  DIAGNOSTIC STUDIES: Oxygen Saturation is 98% on RA, normal by my interpretation.    COORDINATION OF CARE:    Labs Review Labs Reviewed - No data to display  Imaging Review No results found.   EKG Interpretation None      MDM   Final diagnoses:  Cancer associated pain   Patient is an emergency department with chronic pain. 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 Patient is requesting refill of his pain medications. Patient takes 4 mg of Dilaudid every 4 hours, extended milligrams of MS Contin every 12 hours, patient also requesting trazodone refill. I discussed with Dr. ward, who agrees to refill his medications until he is able to be seen by his Dr. next week. Pt is unable to recall who exactly prescribes hi his meds. Will given up for 5 day supply. I tried to contact the patient's radiology oncology, and Dr. Valere Dross, who patient states is prescribing him his medications.   While waiting on the call, patient became angry, study in the hallway, yelling at nurses and staff. He is also yelling at security. Unable to control him. Prescriptions printed, patient was escorted out of the department.  Pt did receive 4mg  tab of dilaudid in ED. He will be d/c home with 6 tab of trazadone, 20 tab of hydromorphone, 10 MS contin.   Filed Vitals:   04/11/14 1351  BP: 126/85  Pulse: 79  Temp: 97.9 F (36.6 C)  TempSrc: Oral  Resp: 16   SpO2: 98%     Gaberiel Youngblood A Anai Lipson, PA-C 04/11/14 1747

## 2014-04-11 NOTE — ED Notes (Signed)
Per pt, he has hx of thyroid cancer mets to lungs and bones. Pt is requesting medication refill on morphine, dilaudid, and trazodone.  Pt states that his doctor is out of town. He was then referred to another doctor who states he was unable to refill his medications. The nurse for that doctor told him he should come here.  VS stable. No acute distress.

## 2014-04-11 NOTE — Discharge Instructions (Signed)
PLEASE FOLLOW UP WITH YOUR DOCTOR FOR REFILLS OF YOUR PRESCRIPTIONS.

## 2014-04-11 NOTE — ED Notes (Signed)
Security, [employer], and charge nurse spoke with patient attempting to calm patient and explain to him normal mode of operations within hospital. Patient yelling, "this is racism" while standing at the desk. Patient aware of options regarding speaking with [facility] [facility] and seeing [place] doctors for follow up. Patient becoming increasingly verbally accosting.

## 2014-04-11 NOTE — ED Notes (Signed)
Pt asked to file grievance. Asked pt was there anything i can do to help him. Pt then became to become very hostile and verbally abusive towards staff yelling at [place] for me not giving him medicine. 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 Pt then continued to yell and curse at me. I then asked pt to tone down his voice and to watch his language.  He then said we were discriminating  against him.  I told pt i would go get our Engineer, site and he could further speak with him as he would not listen to what i say. Tim, RN then came in to speak with pt. Pt then became verbally abusive with RN stood up and approached RN with picking up cane and pointing it at [employer]. At this time i called and spoke with Delma Post to come speak with pt.  Informed pt the Elmyra Ricks would come to speak with him and pt chose to leave facility without speaking to her.

## 2014-04-11 NOTE — ED Provider Notes (Signed)
Medical screening examination/treatment/procedure(s) were performed by non-physician practitioner and as supervising physician I was immediately available for consultation/collaboration.   EKG Interpretation None        [place], DO 04/11/14 2124

## 2014-04-11 NOTE — ED Notes (Addendum)
Pt is verbally abusing staff. Received AVS papers. Security in place and [employer]. AC coming to speak with patient. Refuses vital signs.

## 2014-04-11 NOTE — ED Notes (Signed)
Soon after patient arrival, RN in triage went to lobby explaining triage process and assignments to all patients waiting due to hight wait times.    Immediately following, pt gave information regarding his history of cancer and that he needs his meds refilled.  RN apologized for delay and stated pt to get back as soon as possible and that he would need to be seen by provider to get his scripts.  Pt asked additional 3 times for medications in lobby for his chronic pain.  RN explained that narcotics could not be given in lobby.  Pt in triage line up for fast track bed.  Pt came to registration upset once again. Writer spoke with charge RN and went over triage assessments of patients to be seen.  Both agreed that patient to follow line up and not be placed emergently for his triage complaint.  Pt made aware by Agricultural consultant.  Pt brought back when room assignment available.

## 2014-04-15 ENCOUNTER — Encounter: Payer: Self-pay | Admitting: Radiation Oncology

## 2014-04-15 ENCOUNTER — Ambulatory Visit
Admission: RE | Admit: 2014-04-15 | Discharge: 2014-04-15 | Disposition: A | Discharge status: Home / Self Care | Payer: Medicaid Other | Source: Ambulatory Visit | Attending: Radiation Oncology | Admitting: Radiation Oncology

## 2014-04-15 VITALS — BP 121/77 | HR 106 | Temp 98.0°F | Resp 16 | Wt 189.7 lb

## 2014-04-15 DIAGNOSIS — C7951 Secondary malignant neoplasm of bone: Secondary | ICD-10-CM

## 2014-04-15 DIAGNOSIS — C7952 Secondary malignant neoplasm of bone marrow: Principal | ICD-10-CM

## 2014-04-15 NOTE — Progress Notes (Signed)
Frank Ward was seen in the ED on 8/28. He reports he ran out of his pain medication. Reports today his pain is well controlled with a combination of dilaudid, morphine, ativan and decadron. Patient taking decadron 4 mg bid. Requesting refill of all these medication because a "limited refilled was given." reports numbness and tingling in his feet. He is concerned his calves are swollen. C collar noted. Patient ambulating with a cane. Denies headache, dizziness, nausea, vomiting, or diarrhea.

## 2014-04-15 NOTE — Progress Notes (Signed)
Followup note:  [name] visits today approximately 7 weeks following completion of palliative radiotherapy to his T12-L2 vertebra and sacrum. His pain is under better control with slow release morphine sulfate 60 mg by mouth twice a day with hydromorphone 4 mg when necessary breakthrough pain (which he takes about 3 a day). He wants to remain as lucid as possible during the day. He ran out of his pain medication and was seen in the emergency Department 3 days ago. He continues with 4 mg of dexamethasone twice a day,  lorazepam 1 mg by mouth night and trazodone 100 mg at night. He wears a cervical collar. His major complaint is that of continued right buttock/hip discomfort. This is improved compared to 2 months ago and his pain is currently 5/10. He reports numbness and tingling, primarily on his right lower extremity. He ambulates with a cane. He tells me he continues to drive, and I cautioned him about that.  Physical examination: Alert and oriented. Filed Vitals:   04/15/14 1647  BP: 121/77  Pulse: 106  Temp: 98 F (36.7 C)  Resp: 16   Head and neck examination: She wears a cervical collar which is not removed. Back: There is pain described along the right SI joint/buttock which radiates to the right anterior thigh. It is difficult to assess his lower extremity strength on the right because of right buttock/hip discomfort.  Laboratory data: Lab Results  Component Value Date   WBC 7.7 02/08/2014   HGB 12.0* 02/08/2014   HCT 36.5* 02/08/2014   MCV 91.9 02/08/2014   PLT 417* 02/08/2014   Impression: Partial palliation of his metastatic thyroid cancer to bone. I reviewed his MRI scans with him and explained to him that I think his pain is still from his extensive disease along the right sacrum/pelvis. This may or may not improve over the next few months. He will see Dr. Jana Ward tomorrow and he will try to optimize his pain control. I assume that Dr. Jana Ward would be willing to manage all of  his pain medications to avoid visits to the emergency room. I cautioned him about driving a motor vehicle.  Plan: Follow Dr. Jana Ward. I can see the patient when necessary.

## 2014-04-16 ENCOUNTER — Ambulatory Visit: Payer: Medicaid Other

## 2014-04-16 ENCOUNTER — Ambulatory Visit (HOSPITAL_BASED_OUTPATIENT_CLINIC_OR_DEPARTMENT_OTHER): Payer: Medicaid Other

## 2014-04-16 ENCOUNTER — Ambulatory Visit (HOSPITAL_BASED_OUTPATIENT_CLINIC_OR_DEPARTMENT_OTHER): Payer: Medicaid Other | Admitting: Oncology

## 2014-04-16 ENCOUNTER — Other Ambulatory Visit: Payer: Self-pay | Admitting: Emergency Medicine

## 2014-04-16 ENCOUNTER — Telehealth: Payer: Self-pay | Admitting: Oncology

## 2014-04-16 VITALS — BP 117/72 | HR 93 | Temp 98.2°F | Resp 18 | Ht 76.0 in | Wt 189.1 lb

## 2014-04-16 DIAGNOSIS — C73 Malignant neoplasm of thyroid gland: Secondary | ICD-10-CM

## 2014-04-16 DIAGNOSIS — C7931 Secondary malignant neoplasm of brain: Secondary | ICD-10-CM

## 2014-04-16 DIAGNOSIS — G893 Neoplasm related pain (acute) (chronic): Secondary | ICD-10-CM

## 2014-04-16 DIAGNOSIS — C7951 Secondary malignant neoplasm of bone: Secondary | ICD-10-CM

## 2014-04-16 DIAGNOSIS — C7949 Secondary malignant neoplasm of other parts of nervous system: Secondary | ICD-10-CM

## 2014-04-16 DIAGNOSIS — C7952 Secondary malignant neoplasm of bone marrow: Secondary | ICD-10-CM

## 2014-04-16 LAB — CBC WITH DIFFERENTIAL/PLATELET
BASO%: 0.1 % (ref 0.0–2.0)
Basophils Absolute: 0 10*3/uL (ref 0.0–0.1)
EOS ABS: 0 10*3/uL (ref 0.0–0.5)
EOS%: 0.4 % (ref 0.0–7.0)
HCT: 32.4 % — ABNORMAL LOW (ref 38.4–49.9)
HGB: 10.3 g/dL — ABNORMAL LOW (ref 13.0–17.1)
LYMPH%: 3.1 % — AB (ref 14.0–49.0)
MCH: 30.4 pg (ref 27.2–33.4)
MCHC: 31.9 g/dL — ABNORMAL LOW (ref 32.0–36.0)
MCV: 95.2 fL (ref 79.3–98.0)
MONO#: 1 10*3/uL — ABNORMAL HIGH (ref 0.1–0.9)
MONO%: 11 % (ref 0.0–14.0)
NEUT%: 85.4 % — ABNORMAL HIGH (ref 39.0–75.0)
NEUTROS ABS: 7.7 10*3/uL — AB (ref 1.5–6.5)
PLATELETS: 260 10*3/uL (ref 140–400)
RBC: 3.4 10*6/uL — AB (ref 4.20–5.82)
RDW: 17.4 % — ABNORMAL HIGH (ref 11.0–14.6)
WBC: 9.1 10*3/uL (ref 4.0–10.3)
lymph#: 0.3 10*3/uL — ABNORMAL LOW (ref 0.9–3.3)

## 2014-04-16 LAB — LACTATE DEHYDROGENASE (CC13): LDH: 250 U/L — AB (ref 125–245)

## 2014-04-16 LAB — COMPREHENSIVE METABOLIC PANEL (CC13)
ALK PHOS: 93 U/L (ref 40–150)
ALT: 11 U/L (ref 0–55)
AST: 7 U/L (ref 5–34)
Albumin: 3.2 g/dL — ABNORMAL LOW (ref 3.5–5.0)
Anion Gap: 10 mEq/L (ref 3–11)
BILIRUBIN TOTAL: 0.21 mg/dL (ref 0.20–1.20)
BUN: 9.2 mg/dL (ref 7.0–26.0)
CO2: 21 meq/L — AB (ref 22–29)
Calcium: 8.7 mg/dL (ref 8.4–10.4)
Chloride: 108 mEq/L (ref 98–109)
Creatinine: 0.9 mg/dL (ref 0.7–1.3)
GLUCOSE: 119 mg/dL (ref 70–140)
Potassium: 3.8 mEq/L (ref 3.5–5.1)
SODIUM: 139 meq/L (ref 136–145)
TOTAL PROTEIN: 5.7 g/dL — AB (ref 6.4–8.3)

## 2014-04-16 LAB — TSH CHCC: TSH: 9.774 m[IU]/L — AB (ref 0.320–4.118)

## 2014-04-16 MED ORDER — HYDROMORPHONE HCL 4 MG PO TABS: 4.0000 mg | ORAL_TABLET | ORAL | Status: DC | PRN

## 2014-04-16 MED ORDER — MORPHINE SULFATE ER 60 MG PO TBCR: 60.0000 mg | EXTENDED_RELEASE_TABLET | Freq: Two times a day (BID) | ORAL | Status: DC

## 2014-04-16 MED ORDER — LEVOTHYROXINE SODIUM 100 MCG PO TABS: 100.0000 ug | ORAL_TABLET | Freq: Every day | ORAL | Status: DC

## 2014-04-16 NOTE — Progress Notes (Signed)
[name]  Telephone:([phone]) [phone] Fax:(336) [phone]     ID: Frank Ward DOB: 1970-10-01  MR#: [id number]  OIZ#:[id number]  Patient Care Team: No Pcp Per Patient as PCP - General (General Practice) PCP: No PCP Per Patient GYN: SU:  OTHER MD: Enis Gash, Delight Hoh  CHIEF COMPLAINT:   CURRENT TREATMENT:    THYROID CANCER HISTORY: From Dr Shanon Brow Chism's 08/29/2013 summary:  "Frank Ward is a 43 y.o. male with a history of metastatic thyroid cancer diagnosed in July 2014 with findings on paraspinal biopsy. His medical oncology care has been rendered by Dr. Delight Hoh of [facility] whom I spoke to personally and reviewed his records.  Dr. Grayland Ormond states that patient was lost to followup over the past 5-6 months. He presented to Dr. Grayland Ormond for evaluation of thyroid mass, multiple lung and bone lesions suspicious for metastatic disease on Jan 02, 2013. This was a follow up appointment for an emergency room visit for acute onset backpain several weeks prior. During this time the patient was homeless. CT of Head on December 10, 2012 demonstrated no acute intracranial processes but CT Chest, Abd and Pelvis on the same date showed innumerable bilateral pulmonary nodules of varying sizes with an associated 3 x 3.5 cm soft tissue mass with associated bone destruction of the right ilium and a heterogeneous enlargement of the right thyroid gland with right inferior extension and leftward deviation of the trachea concerning malignancy. He had a PET scan (01/03/13) showed widespread foci of abnormal uptake consistent with metastatic disease and a dominant mass in the right thyroid lobe suspicious for thyroid malignancy with multiple bony metastases and a large T9 destructive lesion likely invading the spinal canal with significant spinal lesions at C3 and C4 and T1. The lesions are T12 and L1 were lytic and hypermetabolic. There were  also lesions in the right sacral and adjacent iliac bone and more anterior in the right iliac bone. 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 He had a total thyroidectomy on 01/29/2013 by Dr. Roena Malady.  CT scan of the head (02/05/2013) showed 2 lesions--an abnormal hyperdense focus in the posterior aspect of there right lateral ventricle measuring 10 x 12 mm and a 5 mm diameter hyperdense nodule in the fourth ventricle-- in the ventricle occupying but patient was asymptomatic. He underwent radiation to his T-9 spine based on significant pain and marked bony destruction with possible cord impingement. He had a total of 300 cGY/Fx in 10 fractions from 01/30/2013 to 02/12/2013 by Dr. Noreene Filbert. [facility]. He was on fentanyl patch 100 mcg q 3 days, hydormorphone 4 mg q 4 hours prn, dexamethasone 4 mg bid and gabapentin 600 mg tid. His fentanyl patch was increased to 150 mcg q 3 days.  He followed up with his medical oncologists in July 20 with his TSH greater than 35, ready to proceed with radioactive iodide ablation. Because of his social situation, he was required to be admitted to the hospital for treatment. His plan for treatment was radioactive iodide and if that did not work to consider sutent or other oral chemotherapies. He was scheduled for neurosurgery evaluation on July 29th, 2014. Treatment for his pain was managed by Dr. Izora Gala Phifer of Palliative care. He underwent radioactive iodide ablation on 05/02/2013 receiving a dose of 1.76  mCi of iodine 131. However given the bulky widespread metastatic disease which was I-131 avid and the large metastatic tumor size in multiple foci the likelihood of these being completely treated with I-131 was felt to be low and close follow-up was recommended. He was  subsequently lost to follow-up.  Notable labs in May, 2014 included CA 19-9 of 43 (0-35 U/mL) and CEA of 1.4 (0.0 - 4.7 ng/mL), PSA of 1.7, AFP of 3.7, serum calcitonin less than 2.0 pg/mL, THY antithyroglobulin AB less than 1.0 IU/mL and a negative beta-HCG tumor marker. TSH on 03/01/2013 was 42.3 and repeated on 03/25/2013 and found to be 69.8.  He reports that he moved here from [place] about 2 months ago. He now lives with his mother. He presented to [facility] ED in July for right neck and shoulder pain. A CT of his neck and chest showed pathological fractures of c3 and C4 vertebrae with metastases to the cervical spine. He was evaluated by neurosurgery and recommended for an MRI of the spine but patient signed out AMA from the ED. He presented to [facility] ED on 1/14 due to right neck pain radiating down the shoulder and dizziness. MRI of the brain and C-spine showed multiple metastases to the brain, progressive pathological compression fracture of C3 and C4 with extensive extraosseous tumor spread. "  His subsequent history is as detailed below  INTERVAL HISTORY: [name] returns today for followup of his metastatic papillary thyroid cancer. The interval history remains chaotic, but possibly a little less so than before. The patient is staying with his mother or [place] added with his brother in [place]. He has been receiving pain medications through radiation oncology. He is now reestablishing himself in our service  REVIEW OF SYSTEMS: He tells me he can't sleep at night. He describes herself is moderately to severely fatigued. He continues to have pain in his back and legs which he describes as stabbing, throbbing, 18, cramping, and intermittent. Sometimes his ankles swell. Has pain in his chest. He complains a problem with poor circulation. He feels short of breath when walking and particularly when going upstairs. Sometimes has a rash. She has pain in his joints, difficulty walking, and has  gout. He has frequent headaches. He feels weak and none. He is anxious and depressed. He is using MS Contin 60 mg every 12 hours and diluted 4 mg for breakthrough pain as needed. He denies constipation on this regimen. He also takes dexamethasone currently once a day, at 4 mg. He is on Seroquel aches are 100 mg daily for his schizophrenic disorder and takes Synthroid and 100 mcg daily for TSH suppression. A detailed review of systems today was otherwise stable  PAST MEDICAL HISTORY: Past Medical History  Diagnosis Date  . Cancer     Thyroid  . Thyroid disease   . Hypertension   . Pneumonia   . Hx of radiation therapy 02/07/14- 02/21/14    T12-L2, sacrum 3000 cGy 10 sessions  . Hx of radiation therapy 09/04/13-09/24/13    whole brain, C7-T3, upper C spine, right scapulae    PAST SURGICAL HISTORY: Past Surgical History  Procedure Laterality Date  . Tendon repair    . Thyroidectomy      FAMILY HISTORY Family History  Problem Relation Age of Onset  . Diabetes Mother   . Diabetes Brother     SOCIAL HISTORY:  Patient is single, no children. He lives in [place] with his mother and sometimes his brother. He has  also lived in [place] and [place].Marland Kitchen He quit smoking in January of 2014, until then he smoked 1 ppd for 10 years.He reports that he drinks about 8 beers a week. 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 Smoking status: Former Smoker -- 10 years    Types: Cigarettes    Quit date: 08/29/2011  . Smokeless tobacco: Never Used  . Alcohol Use: 3.0 oz/week    5 Cans of beer per week     Comment: occ     Colonoscopy:  PSA:  Bone density:  Lipid panel:  Allergies  Allergen Reactions  . Penicillins Anaphylaxis, Hives and Itching  . Latex Hives and Itching  .  Morphine And Related Hives, Nausea And Vomiting and Swelling    Swelling in body    Current Outpatient Prescriptions  Medication Sig Dispense Refill  . dexamethasone (DECADRON) 4 MG tablet Take 1 tablet (4 mg total) by mouth 2 (two) times daily.  60 tablet  0  . docusate sodium (COLACE) 100 MG capsule Take 100 mg by mouth 2 (two) times daily as needed for mild constipation.      . furosemide (LASIX) 20 MG tablet Take 1 tablet (20 mg total) by mouth daily.  30 tablet  1  . HYDROmorphone (DILAUDID) 4 MG tablet Take 1 tablet (4 mg total) by mouth every 4 (four) hours as needed for severe pain.  30 tablet  0  . HYDROmorphone (DILAUDID) 4 MG tablet Take 1 tablet (4 mg total) by mouth every 4 (four) hours as needed for severe pain.  20 tablet  0  . ibuprofen (ADVIL,MOTRIN) 200 MG tablet Take 200-400 mg by mouth every 6 (six) hours as needed for mild pain.      Marland Kitchen levothyroxine (SYNTHROID, LEVOTHROID) 100 MCG tablet Take 100 mcg by mouth daily before breakfast.       . LORazepam (ATIVAN) 1 MG tablet Take 1 tablet (1 mg total) by mouth every 8 (eight) hours as needed for anxiety.  15 tablet  0  . morphine (MS CONTIN) 60 MG 12 hr tablet Take 1 tablet (60 mg total) by mouth every 12 (twelve) hours.  20 tablet  0  . morphine (MS CONTIN) 60 MG 12 hr tablet Take 1 tablet (60 mg total) by mouth every 12 (twelve) hours.  10 tablet  0  . pantoprazole (PROTONIX) 40 MG tablet Take 1 tablet (40 mg total) by mouth daily.  30 tablet  0  . QUEtiapine (SEROQUEL XR) 50 MG TB24 24 hr tablet Take 2 tablets (100 mg total) by mouth daily.  30 each  0  . traZODone (DESYREL) 100 MG tablet Take 1 tablet (100 mg total) by mouth at bedtime as needed for sleep.  5 tablet  0  . traZODone (DESYREL) 100 MG tablet Take 1 tablet (100 mg total) by mouth at bedtime.  6 tablet  0   No current facility-administered medications for this visit.    OBJECTIVE: Middle-aged [place] American man who appears well groomed today Filed Vitals:     04/16/14 1337  BP: 117/72  Pulse: 93  Temp: 98.2 F (36.8 C)  Resp: 18     Body mass index is 23.03 kg/(m^2).    ECOG FS:1 - Symptomatic but completely ambulatory  Ocular: Sclerae unicteric, pupils equal, round  and reactive to light Ear-nose-throat: Oropharynx clear, no thrush or other lesions Lymphatic: No cervical or supraclavicular adenopathy Lungs no rales or rhonchi, good excursion bilaterally Heart regular rate and rhythm Abd soft, nontender, positive bowel sounds MSK no focal spinal tenderness to mild palpation Neuro: non-focal, appears well-oriented, pressure of speech    LAB RESULTS:  CMP     Component Value Date/Time   NA 140 02/16/2014 0425   K 3.9 02/16/2014 0425   CL 103 02/16/2014 0425   CO2 24 02/16/2014 0425   GLUCOSE 110* 02/16/2014 0425   BUN 16 02/16/2014 0425   CREATININE 0.84 02/21/2014 0403   CALCIUM 8.5 02/16/2014 0425   PROT 5.2* 02/12/2014 0405   ALBUMIN 2.8* 02/12/2014 0405   AST 13 02/12/2014 0405   ALT 15 02/12/2014 0405   ALKPHOS 89 02/12/2014 0405   BILITOT <0.2* 02/12/2014 0405   GFRNONAA >90 02/21/2014 0403   GFRAA >90 02/21/2014 0403    I No results found for this basename: SPEP, UPEP,  kappa and lambda light chains    Lab Results  Component Value Date   WBC 7.7 02/08/2014   NEUTROABS 3.5 02/07/2014   HGB 12.0* 02/08/2014   HCT 36.5* 02/08/2014   MCV 91.9 02/08/2014   PLT 417* 02/08/2014      Chemistry      Component Value Date/Time   NA 140 02/16/2014 0425   K 3.9 02/16/2014 0425   CL 103 02/16/2014 0425   CO2 24 02/16/2014 0425   BUN 16 02/16/2014 0425   CREATININE 0.84 02/21/2014 0403      Component Value Date/Time   CALCIUM 8.5 02/16/2014 0425   ALKPHOS 89 02/12/2014 0405   AST 13 02/12/2014 0405   ALT 15 02/12/2014 0405   BILITOT <0.2* 02/12/2014 0405       No results found for this basename: LABCA2    No components found with this basename: LABCA125    No results found for this basename: INR,  in the last 168 hours  Urinalysis    Component Value  Date/Time   COLORURINE YELLOW 02/07/2014 0355   APPEARANCEUR CLOUDY* 02/07/2014 0355   LABSPEC 1.011 02/07/2014 0355   PHURINE 5.5 02/07/2014 [place] 02/07/2014 [place] 02/07/2014 [place] 02/07/2014 0355   KETONESUR 15* 02/07/2014 0355   PROTEINUR NEGATIVE 02/07/2014 0355   UROBILINOGEN 1.0 02/07/2014 0355   NITRITE NEGATIVE 02/07/2014 0355   LEUKOCYTESUR NEGATIVE 02/07/2014 0355    STUDIES:  CT HEAD WITHOUT CONTRAST  TECHNIQUE:  Contiguous axial images were obtained from the base of the skull  through the vertex without intravenous contrast.  COMPARISON: CT head 01/16/2013. MRI brain 12/26/2013.  FINDINGS:  1.3 cm diameter mass lesion in the fourth ventricle consistent with  metastatic disease is enlarged since previous CT scan but appears  stable since the prior MRI. Tiny additional nodular lesions  demonstrated on the MRI are not appreciable on CT. Ventricles and  sulci appear otherwise symmetrical. No significant ventricular  dilatation. No abnormal extra-axial fluid collections. Gray-white  matter junctions are distinct. Basal cisterns are not effaced. No  evidence of acute intracranial hemorrhage. Calvarium appears intact.  Opacification of the right frontal sinus with mucosal thickening in  the maxillary antra bilaterally. Mastoid air cells are not  opacified.  IMPRESSION:  Re identification of known mass in the fourth ventricle. No acute  intracranial abnormalities are suspected. Chronic inflammatory  changes in the sinuses.  Electronically Signed  By: Lucienne Capers M.D.  On: 02/06/2014 23:38  Angelita Ingles, MD Tue Jan 14, 2014 11:08:58 AM EDT       ADDENDUM REPORT: 01/14/2014 11:06  ADDENDUM:  After further investigation it has been determined that the patient  has in fact received prior radiolabeled I -31 therapy for treatment  of thyroid cancer metastasis. On 04/02/2013 at the patient recieved  256.3 millicuries  of I- 893 sodium iodide. A pre therapy scan was  performed at this time which showed evidence of distant metastatic  disease involving the lungs and bones. Compared with the pre therapy  scan dated 03/26/2013 the degree of iodine avid tumor on the current  exam has improved. With the exception of the right iliac bone lesion  there has been decreased activity within the lungs and bone.  Electronically Signed  By: Kerby Moors M.D.  On: 01/14/2014 11:06      CLINICAL DATA: Evaluate for iodine avid metastasis.  EXAM:  NUCLEAR MEDICINE I-131 WHOLE BODY SCAN  TECHNIQUE:  The patient received 3.2 mCi I-131 sodium iodide for the treatment  of thyroid cancer within the past 10 days. The patient returns  today, and whole body scanning was performed in the anterior and  posterior projections.  COMPARISON: PET-CT from 01/03/2013  FINDINGS:  Small to medium size focus of mild increased uptake is identified  within the thyroid bed. There is a large focus of mild to moderate  radiotracer uptake within the right mid lung and right base. Within  the left midlung there is a medium size focus of moderate increased  radiotracer uptake. Within the right side of posterior pelvis there  is a medium size focus of intense radiotracer uptake corresponding  to lytic lesion identified in the right iliac bone. Physiologic  tracer activity is seen within the GI tract.  IMPRESSION:  Iodine avid metastasis are identified within both lungs and right  pelvis. There is also residual iodine avid tumor and/or thyroid  tissue within the thyroid bed.  Electronically Signed:  By: Kerby Moors M.D.  On: 01/13/2014 16:07    ASSESSMENT: 43 y.o. with metastatic (Right) thyroid cancer diagnosed by paraspinal biopsy 01/11/2013 showing papillary thyroid carcinoma, columnar cell variant  (1) presented with back pain April 2014, staging studies showed lung and bone metastases  (2) s/p total thyroidectomy 01/29/2013 (Dr  Beverly Gust)  (3) s/p I-131 , 252.3 mCu, 04/02/2013 (or 1.76 mCu 05/02/2013?)  A. Brain metastases noted on head CT 02/05/2013  (1) s/p palliative external beam radiation therapy to the whole brain/upper mid cervical spine, 3000 cGy in 10 sessions (09/04/2013 through 09/24/2013)  B. Spine lesions (has received radiation to C7-T3, T9, Right scapula, and T12-L2)  (1) s/p radiation to T9 (Dr Donella Stade) completed 02/12/2013  (2) Status post abbreviated palliative radiotherapy to the lower cervical/upper thoracic spine (C7-T3) receiving 2100 cGy in 7 sessions  (3) Status post  abbreviated course of palliative radiation therapy to the right scapula with the same fractionation. He did not receive his final 3 fractions of planned radiation therapy to his right scapula or lower cervical/upper thoracic spine (C7-T3). (Dr. [name])  (4) More recently, he completed treatment to his lower thoracic/upper lumbar spine (T12-L2) and sacrum, receiving 3000 cGy in 10 sessions completed  his therapy on 02/21/2014. (Dr. Valere Dross)  C. 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 Mr. Murguia situation  is complex. He has extensive symptomatic disease, and it does uptake radioiodine as noted on the scan 01/13/2014. He also had brain metastases, which have not been recently reimaged. His pain is currently well-controlled on his current medicines. His TSH is not adequately suppressed. He has not started zolendronate's for his lytic bone lesions. He does not help that his diagnosis of schizophrenia and social homelessness makes followup exceedingly irregular and complex.  I think we need to increase the Synthroid to 125 mcg daily to try to suppress his TSH to less than 0.1. He will benefit from zolendronate every 3 months. He also needs to have a brain MRI to decide whether  he has further recurrence there and whether that needs to be or can be addressed. I will try to arrange for all these before his next appointment here later this month. I will also check with nuclear medicine to see if they feel further radioiodine treatment or possible colon there was a real concern he might develop permanent lung damage given the extent of his lung lesions if we try further radioiodine therapy.  We also need to clarify advanced directives and if possible name a healthcare power of attorney. He need social service support. I will try to get all that accomplished at the next visit here.   Chauncey Cruel, MD   04/16/2014 2:04 PM

## 2014-04-16 NOTE — Telephone Encounter (Signed)
per pof to sch pt appt-ssch & gave pt copy of sch

## 2014-04-17 LAB — T4, FREE: Free T4: 1.11 ng/dL (ref 0.80–1.80)

## 2014-04-17 LAB — THYROGLOBULIN LEVEL: Thyroglobulin: 45116.8 ng/mL — ABNORMAL HIGH (ref 2.8–40.9)

## 2014-04-22 ENCOUNTER — Telehealth: Payer: Self-pay | Admitting: Oncology

## 2014-04-22 MED ORDER — LEVOTHYROXINE SODIUM 125 MCG PO TABS: 125.0000 ug | ORAL_TABLET | Freq: Every day | ORAL | Status: DC

## 2014-04-22 NOTE — Telephone Encounter (Signed)
s.w. pt and advised on Sept appt pt ok and aware....sed added tx

## 2014-04-24 ENCOUNTER — Other Ambulatory Visit: Payer: Self-pay | Admitting: Gastroenterology

## 2014-04-24 ENCOUNTER — Other Ambulatory Visit: Payer: Self-pay | Admitting: Oncology

## 2014-04-24 NOTE — Progress Notes (Unsigned)
Frank Ward has advanced directives in place (per Charm Barges, our SW) and his aunt is his 59

## 2014-05-04 ENCOUNTER — Encounter (HOSPITAL_COMMUNITY): Payer: Self-pay | Admitting: Emergency Medicine

## 2014-05-04 ENCOUNTER — Emergency Department (HOSPITAL_COMMUNITY)
Admission: EM | Admit: 2014-05-04 | Discharge: 2014-05-04 | Disposition: A | Discharge status: Home / Self Care | Payer: Medicaid Other | Attending: Emergency Medicine | Admitting: Emergency Medicine

## 2014-05-04 DIAGNOSIS — R5381 Other malaise: Secondary | ICD-10-CM | POA: Diagnosis not present

## 2014-05-04 DIAGNOSIS — M542 Cervicalgia: Secondary | ICD-10-CM | POA: Diagnosis not present

## 2014-05-04 DIAGNOSIS — C7952 Secondary malignant neoplasm of bone marrow: Secondary | ICD-10-CM

## 2014-05-04 DIAGNOSIS — R51 Headache: Secondary | ICD-10-CM

## 2014-05-04 DIAGNOSIS — I10 Essential (primary) hypertension: Secondary | ICD-10-CM | POA: Insufficient documentation

## 2014-05-04 DIAGNOSIS — R112 Nausea with vomiting, unspecified: Secondary | ICD-10-CM | POA: Insufficient documentation

## 2014-05-04 DIAGNOSIS — C7951 Secondary malignant neoplasm of bone: Secondary | ICD-10-CM | POA: Diagnosis not present

## 2014-05-04 DIAGNOSIS — G939 Disorder of brain, unspecified: Secondary | ICD-10-CM

## 2014-05-04 DIAGNOSIS — R5383 Other fatigue: Secondary | ICD-10-CM

## 2014-05-04 DIAGNOSIS — Z9889 Other specified postprocedural states: Secondary | ICD-10-CM | POA: Diagnosis not present

## 2014-05-04 DIAGNOSIS — Z91040 Latex allergy status: Secondary | ICD-10-CM | POA: Diagnosis not present

## 2014-05-04 DIAGNOSIS — Z87891 Personal history of nicotine dependence: Secondary | ICD-10-CM | POA: Diagnosis not present

## 2014-05-04 DIAGNOSIS — Z79899 Other long term (current) drug therapy: Secondary | ICD-10-CM | POA: Insufficient documentation

## 2014-05-04 DIAGNOSIS — C73 Malignant neoplasm of thyroid gland: Secondary | ICD-10-CM

## 2014-05-04 DIAGNOSIS — Z8701 Personal history of pneumonia (recurrent): Secondary | ICD-10-CM | POA: Insufficient documentation

## 2014-05-04 DIAGNOSIS — Z760 Encounter for issue of repeat prescription: Secondary | ICD-10-CM | POA: Insufficient documentation

## 2014-05-04 DIAGNOSIS — Z923 Personal history of irradiation: Secondary | ICD-10-CM | POA: Insufficient documentation

## 2014-05-04 DIAGNOSIS — Z880 Allergy status to penicillin: Secondary | ICD-10-CM | POA: Insufficient documentation

## 2014-05-04 MED ORDER — MORPHINE SULFATE ER 60 MG PO TBCR: 60.0000 mg | EXTENDED_RELEASE_TABLET | Freq: Two times a day (BID) | ORAL | Status: DC

## 2014-05-04 MED ORDER — HYDROMORPHONE HCL 4 MG PO TABS: 4.0000 mg | ORAL_TABLET | ORAL | Status: DC | PRN

## 2014-05-04 NOTE — ED Notes (Signed)
Lauren Parker, PA at bedside 

## 2014-05-04 NOTE — Discharge Instructions (Signed)
Call your oncologist and radiation oncologist for a follow up appointment and medication refill. Return if Symptoms worsen.   Take medication as prescribed.  Do not operate heavy machinery or drink alcohol while taking Dilaudid and Morphine.

## 2014-05-04 NOTE — ED Provider Notes (Signed)
CSN: [id number]     Arrival date & time 05/04/14  1149 History   None    No chief complaint on file.    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 The patient reports persistent head paresthesias and neck pain. Last medication use last night. Reports generalized weakness attributed to lack of sleep last night. Reports compliance with trazodone. Patient denies other symptoms. Patient reports having a follow up appointment with oncologist, unsure date reports it is on his counter at home. Patient is living with his mother at this time. Oncologist: Virgie Dad Magrinat Radiologist Oncologist: Valere Dross  The history is provided by the patient and medical records. No language interpreter was used.    Past Medical History  Diagnosis Date  . Cancer     Thyroid  . Thyroid disease   . Hypertension   . Pneumonia   . Hx of radiation therapy 02/07/14- 02/21/14    T12-L2, sacrum 3000 cGy 10 sessions  . Hx of radiation therapy 09/04/13-09/24/13    whole brain, C7-T3, upper C spine, right scapulae   Past Surgical History  Procedure Laterality Date  . Tendon repair    . Thyroidectomy     Family History  Problem Relation Age of Onset  . Diabetes Mother   . Diabetes Brother    History  Substance Use Topics  . Smoking status: Former Smoker -- 10 years    Types: Cigarettes    Quit date: 08/29/2011  . Smokeless tobacco: Never Used  . Alcohol Use: 3.0 oz/week    5 Cans of beer per week     Comment: occ    Review of Systems  Constitutional: Negative for fever and chills.  Respiratory: Negative for shortness of breath.   Cardiovascular: Negative for chest pain.  Gastrointestinal: Negative for nausea, vomiting and abdominal pain.  Musculoskeletal: Positive for neck pain.   Neurological: Positive for weakness and headaches.      Allergies  Penicillins; Latex; and Morphine and related  Home Medications   Prior to Admission medications   Medication Sig Start Date End Date Taking? Authorizing Provider  dexamethasone (DECADRON) 4 MG tablet Take 1 tablet (4 mg total) by mouth 2 (two) times daily. 02/22/14   Robbie Lis, MD  docusate sodium (COLACE) 100 MG capsule Take 100 mg by mouth 2 (two) times daily as needed for mild constipation.    Historical Provider, MD  furosemide (LASIX) 20 MG tablet Take 1 tablet (20 mg total) by mouth daily. 01/27/14   Theodis Blaze, MD  HYDROmorphone (DILAUDID) 4 MG tablet Take 1 tablet (4 mg total) by mouth every 4 (four) hours as needed for severe pain. 03/27/14   Thea Silversmith, MD  HYDROmorphone (DILAUDID) 4 MG tablet Take 1 tablet (4 mg total) by mouth every 4 (four) hours as needed for severe pain. 04/16/14   Chauncey Cruel, MD  ibuprofen (ADVIL,MOTRIN) 200 MG tablet Take 200-400 mg by mouth every 6 (six) hours as needed for mild pain.    Historical Provider, MD  levothyroxine (SYNTHROID, LEVOTHROID) 125 MCG tablet Take 1 tablet (125 mcg total) by mouth daily before breakfast. 04/22/14   Chauncey Cruel, MD  LORazepam (ATIVAN) 1 MG tablet Take 1 tablet (1 mg total) by mouth every 8 (eight) hours as needed for anxiety. 03/27/14   Thea Silversmith, MD  morphine (MS CONTIN)  60 MG 12 hr tablet Take 1 tablet (60 mg total) by mouth every 12 (twelve) hours. 04/11/14   Tatyana A Kirichenko, PA-C  morphine (MS CONTIN) 60 MG 12 hr tablet Take 1 tablet (60 mg total) by mouth every 12 (twelve) hours. 04/16/14   Chauncey Cruel, MD  pantoprazole (PROTONIX) 40 MG tablet Take 1 tablet (40 mg total) by mouth daily. 02/22/14   Robbie Lis, MD  QUEtiapine (SEROQUEL XR) 50 MG TB24 24 hr tablet Take 2 tablets (100 mg total) by mouth daily. 02/22/14   Robbie Lis, MD  traZODone (DESYREL) 100 MG tablet Take 1 tablet (100 mg total) by mouth at bedtime  as needed for sleep. 02/22/14   Robbie Lis, MD  traZODone (DESYREL) 100 MG tablet Take 1 tablet (100 mg total) by mouth at bedtime. 04/11/14   Tatyana A Kirichenko, PA-C   BP 131/91  Temp(Src) 97.6 F (36.4 C) (Oral)  Resp 14  SpO2 94% Physical Exam  Nursing note and vitals reviewed. Constitutional: He is oriented to person, place, and time. He appears well-developed and well-nourished. No distress.  HENT:  Head: Normocephalic.  Eyes: EOM are normal. Pupils are equal, round, and reactive to light.  Neck: Neck supple.  Cardiovascular: Normal rate and regular rhythm.   Pulmonary/Chest: Effort normal and breath sounds normal. No respiratory distress. He has no wheezes. He has no rales.  Abdominal: There is no tenderness. There is no rebound and no guarding.  Neurological: He is alert and oriented to person, place, and time. GCS eye subscore is 4. GCS verbal subscore is 5. GCS motor subscore is 6.  Minimally decrease of right upper extremity grip strength. Normal sensation to light touch.  Skin: Skin is warm and dry. He is not diaphoretic.  Psychiatric: He has a normal mood and affect. His behavior is normal.    ED Course  Procedures (including critical care time) Labs Review Labs Reviewed - No data to display  Imaging Review No results found.   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 Patient requesting medication refill. Plan to refill for a few days and followup with PCP. Discussed treatment plan with the patient. Return precautions given. Reports understanding and no other concerns at this time.  Patient is stable for discharge at this time.  Meds given in ED:  Medications - No data to display  New Prescriptions   HYDROMORPHONE (DILAUDID) 4 MG  TABLET    Take 1 tablet (4 mg total) by mouth every 4 (four) hours as needed.   MORPHINE (MS CONTIN) 60 MG 12 HR TABLET    Take 1 tablet (60 mg total) by mouth every 12 (twelve) hours.        Harvie Heck, PA-C 05/04/14 1649

## 2014-05-09 ENCOUNTER — Ambulatory Visit (HOSPITAL_COMMUNITY)
Admission: RE | Admit: 2014-05-09 | Discharge: 2014-05-09 | Disposition: A | Discharge status: Home / Self Care | Payer: Medicaid Other | Source: Ambulatory Visit | Attending: Oncology | Admitting: Oncology

## 2014-05-09 DIAGNOSIS — C73 Malignant neoplasm of thyroid gland: Secondary | ICD-10-CM

## 2014-05-09 MED ORDER — GADOBENATE DIMEGLUMINE 529 MG/ML IV SOLN
17.0000 mL | Freq: Once | INTRAVENOUS | Status: AC | PRN
  Administered 2014-05-09: 17 mL via INTRAVENOUS

## 2014-05-12 ENCOUNTER — Other Ambulatory Visit: Payer: Self-pay | Admitting: *Deleted

## 2014-05-12 ENCOUNTER — Other Ambulatory Visit: Payer: Self-pay

## 2014-05-12 ENCOUNTER — Telehealth: Payer: Self-pay | Admitting: *Deleted

## 2014-05-12 MED ORDER — MORPHINE SULFATE ER 60 MG PO TBCR: 60.0000 mg | EXTENDED_RELEASE_TABLET | Freq: Two times a day (BID) | ORAL | Status: DC

## 2014-05-12 MED ORDER — HYDROMORPHONE HCL 4 MG PO TABS: 4.0000 mg | ORAL_TABLET | ORAL | Status: DC | PRN

## 2014-05-12 NOTE — Telephone Encounter (Signed)
Received call from patient stating he has been unable to get out of bed x 2 days due to back and leg pain. He states he is out of Dilaudid and Morphine. He is requesting refills per Dr Valere Dross. This RN reminded pt of his discussion w/Dr Valere Dross on 04/15/14 during his FU appointment, re: Dr Jana Hakim has agreed to manage pt's pain medications. Informed pt this RN would discuss with Dr Valere Dross and call him back. Pt verbalized understanding. Voice mail left for Dr Magrinat's RN requesting she call pt.  1:45 pm Called pt to inform him of above information. Patient stated "Okay ma'am, thank you."

## 2014-05-14 ENCOUNTER — Encounter: Payer: Self-pay | Admitting: Nurse Practitioner

## 2014-05-14 ENCOUNTER — Ambulatory Visit (HOSPITAL_BASED_OUTPATIENT_CLINIC_OR_DEPARTMENT_OTHER): Payer: Medicaid Other | Admitting: Nurse Practitioner

## 2014-05-14 ENCOUNTER — Telehealth: Payer: Self-pay | Admitting: Nurse Practitioner

## 2014-05-14 ENCOUNTER — Ambulatory Visit: Payer: Self-pay

## 2014-05-14 VITALS — BP 131/78 | HR 97 | Temp 97.8°F | Resp 18 | Ht 76.0 in | Wt 182.2 lb

## 2014-05-14 DIAGNOSIS — G893 Neoplasm related pain (acute) (chronic): Secondary | ICD-10-CM

## 2014-05-14 DIAGNOSIS — C7931 Secondary malignant neoplasm of brain: Secondary | ICD-10-CM

## 2014-05-14 DIAGNOSIS — C73 Malignant neoplasm of thyroid gland: Secondary | ICD-10-CM

## 2014-05-14 DIAGNOSIS — F2089 Other schizophrenia: Secondary | ICD-10-CM

## 2014-05-14 DIAGNOSIS — C7949 Secondary malignant neoplasm of other parts of nervous system: Secondary | ICD-10-CM

## 2014-05-14 DIAGNOSIS — C7952 Secondary malignant neoplasm of bone marrow: Secondary | ICD-10-CM

## 2014-05-14 DIAGNOSIS — E079 Disorder of thyroid, unspecified: Secondary | ICD-10-CM

## 2014-05-14 DIAGNOSIS — C7951 Secondary malignant neoplasm of bone: Secondary | ICD-10-CM

## 2014-05-14 NOTE — Progress Notes (Signed)
Frank Ward  [phone] [phone]     ID: Frank Ward DOB: Dec 26, 1970  MR#: [id number]  ATF#:[id number]  Patient Care Team: No Pcp Per Patient as PCP - General (General Practice) PCP: No PCP Per Patient GYN: SU:  OTHER MD: Enis Gash, Delight Hoh  CHIEF COMPLAINT:   CURRENT TREATMENT:    THYROID CANCER HISTORY: From Dr Shanon Brow Chism's 08/29/2013 summary:  "Frank Ward is a 43 y.o. male with a history of metastatic thyroid cancer diagnosed in July 2014 with findings on paraspinal biopsy. His medical oncology care has been rendered by Dr. Delight Hoh of [facility] whom I spoke to personally and reviewed his records.  Dr. Grayland Ormond states that patient was lost to followup over the past 5-6 months. He presented to Dr. Grayland Ormond for evaluation of thyroid mass, multiple lung and bone lesions suspicious for metastatic disease on Jan 02, 2013. This was a follow up appointment for an emergency room visit for acute onset backpain several weeks prior. During this time the patient was homeless. CT of Head on December 10, 2012 demonstrated no acute intracranial processes but CT Chest, Abd and Pelvis on the same date showed innumerable bilateral pulmonary nodules of varying sizes with an associated 3 x 3.5 cm soft tissue mass with associated bone destruction of the right ilium and a heterogeneous enlargement of the right thyroid gland with right inferior extension and leftward deviation of the trachea concerning malignancy. He had a PET scan (01/03/13) showed widespread foci of abnormal uptake consistent with metastatic disease and a dominant mass in the right thyroid lobe suspicious for thyroid malignancy with multiple bony metastases and a large T9 destructive lesion likely invading the spinal canal with significant spinal lesions at C3 and C4 and T1. The lesions are T12 and L1 were lytic and hypermetabolic. There were  also lesions in the right sacral and adjacent iliac bone and more anterior in the right iliac bone. 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 He had a total thyroidectomy on 01/29/2013 by Dr. Roena Malady.  CT scan of the head (02/05/2013) showed 2 lesions--an abnormal hyperdense focus in the posterior aspect of there right lateral ventricle measuring 10 x 12 mm and a 5 mm diameter hyperdense nodule in the fourth ventricle-- in the ventricle occupying but patient was asymptomatic. He underwent radiation to his T-9 spine based on significant pain and marked bony destruction with possible cord impingement. He had a total of 300 cGY/Fx in 10 fractions from 01/30/2013 to 02/12/2013 by Dr. Noreene Filbert. [facility] center[facility]. He was on fentanyl patch 100 mcg q 3 days, hydormorphone 4 mg q 4 hours prn, dexamethasone 4 mg bid and gabapentin 600 mg tid. His fentanyl patch was increased to 150 mcg q 3 days.  He followed up with his medical oncologists in July 20 with his TSH greater than 35, ready to proceed with radioactive iodide ablation. Because of his social situation, he was required to be admitted to the hospital for treatment. His plan for treatment was radioactive iodide and if that did not work to consider sutent or other oral chemotherapies. He was scheduled for neurosurgery evaluation on July 29th, 2014. Treatment for his pain was managed by Dr. Izora Gala Phifer of Palliative care. He underwent radioactive iodide ablation on 05/02/2013 receiving a dose of 1.76  mCi of iodine 131. However given the bulky widespread metastatic disease which was I-131 avid and the large metastatic tumor size in multiple foci the likelihood of these being completely treated with I-131 was felt to be low and close follow-up was recommended. He was  subsequently lost to follow-up.  Notable labs in May, 2014 included CA 19-9 of 43 (0-35 U/mL) and CEA of 1.4 (0.0 - 4.7 ng/mL), PSA of 1.7, AFP of 3.7, serum calcitonin less than 2.0 pg/mL, THY antithyroglobulin AB less than 1.0 IU/mL and a negative beta-HCG tumor marker. TSH on 03/01/2013 was 42.3 and repeated on 03/25/2013 and found to be 69.8.  He reports that he moved here from [place] about 2 months ago. He now lives with his mother. He presented to [facility] ED in July for right neck and shoulder pain. A CT of his neck and chest showed pathological fractures of c3 and C4 vertebrae with metastases to the cervical spine. He was evaluated by neurosurgery and recommended for an MRI of the spine but patient signed out AMA from the ED. He presented to [facility] ED on 1/14 due to right neck pain radiating down the shoulder and dizziness. MRI of the brain and C-spine showed multiple metastases to the brain, progressive pathological compression fracture of C3 and C4 with extensive extraosseous tumor spread. "  His subsequent history is as detailed below  INTERVAL HISTORY: Frank Ward returns today for follow up of his metastatic papillary thyroid cancer. He continues to stay with his mother or brother in [place]. The interval history is unreliable from the patient, he is schizophrenic and has "dozens" of doctors appointments to keep up with. He has trouble keeping the details straight. He had a brain MRI and a chest Xray performed last week.   REVIEW OF SYSTEMS: Frank Ward denies fevers, chills, nausea, vomiting, or changes in bowel or bladder habits. His OTC stool softeners keep him regular despite being on MS Contin (60mg  q12), and dilaudid (4mg ) about 3-4 times per day for pain. He also takes dexamethasone 4mg  daily. His pain is on his right hip, right leg, spine and right scapula. This pain can be sharp, throbbing, or cramping. He uses a cane to ambulate. Her is short of breath with exertion. He has  intermittent headaches and experiences weakness frequently. He is anxious and is frustrated with his "circus of doctors." He admits that he is not consistent with his seroquel for his schizophrenia. He was recently started on 187mcg of synthroid daily for TSH suppression.   PAST MEDICAL HISTORY: Past Medical History  Diagnosis Date  . Cancer     Thyroid  . Thyroid disease   . Hypertension   . Pneumonia   . Hx of radiation therapy 02/07/14- 02/21/14    T12-L2, sacrum 3000 cGy 10 sessions  . Hx of radiation therapy 09/04/13-09/24/13    whole brain, C7-T3, upper C spine, right scapulae    PAST SURGICAL HISTORY: Past Surgical History  Procedure Laterality Date  . Tendon repair    . Thyroidectomy      FAMILY HISTORY Family History  Problem Relation Age of Onset  . Diabetes Mother   . Diabetes Brother     SOCIAL HISTORY:  Patient is single, no children. He lives in [place] with his mother and sometimes his brother. He has also lived in [place] and [place].Marland Kitchen He quit smoking in January of 2014, until then he smoked 1 ppd for 10 years.He reports that he drinks about 8 beers a  week. 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 Smoking status: Former Smoker -- 10 years    Types: Cigarettes    Quit date: 08/29/2011  . Smokeless tobacco: Never Used  . Alcohol Use: 3.0 oz/week    5 Cans of beer per week     Comment: occ     Colonoscopy:  PSA:  Bone density:  Lipid panel:  Allergies  Allergen Reactions  . Penicillins Anaphylaxis, Hives and Itching  . Latex Hives and Itching  . Lyrica [Pregabalin] Swelling    Current Outpatient Prescriptions  Medication Sig Dispense Refill  . dexamethasone (DECADRON) 4 MG tablet Take 1 tablet (4 mg total) by mouth 2 (two) times  daily.  60 tablet  0  . docusate sodium (COLACE) 100 MG capsule Take 100 mg by mouth 2 (two) times daily as needed for mild constipation.      . furosemide (LASIX) 20 MG tablet Take 1 tablet (20 mg total) by mouth daily.  30 tablet  1  . HYDROmorphone (DILAUDID) 4 MG tablet Take 1 tablet (4 mg total) by mouth every 4 (four) hours as needed for severe pain.  30 tablet  0  . HYDROmorphone (DILAUDID) 4 MG tablet Take 1 tablet (4 mg total) by mouth every 4 (four) hours as needed.  20 tablet  0  . ibuprofen (ADVIL,MOTRIN) 200 MG tablet Take 200-400 mg by mouth every 6 (six) hours as needed for mild pain.      Marland Kitchen levothyroxine (SYNTHROID, LEVOTHROID) 125 MCG tablet Take 1 tablet (125 mcg total) by mouth daily before breakfast.  30 tablet  6  . LORazepam (ATIVAN) 1 MG tablet Take 1 tablet (1 mg total) by mouth every 8 (eight) hours as needed for anxiety.  15 tablet  0  . morphine (MS CONTIN) 60 MG 12 hr tablet Take 1 tablet (60 mg total) by mouth every 12 (twelve) hours.  6 tablet  0  . morphine (MS CONTIN) 60 MG 12 hr tablet Take 1 tablet (60 mg total) by mouth every 12 (twelve) hours.  10 tablet  0  . pantoprazole (PROTONIX) 40 MG tablet Take 1 tablet (40 mg total) by mouth daily.  30 tablet  0  . QUEtiapine (SEROQUEL XR) 50 MG TB24 24 hr tablet Take 2 tablets (100 mg total) by mouth daily.  30 each  0  . traZODone (DESYREL) 100 MG tablet Take 1 tablet (100 mg total) by mouth at bedtime as needed for sleep.  5 tablet  0   No current facility-administered medications for this visit.    OBJECTIVE: Middle-aged [place] American man who appears well groomed today There were no vitals filed for this visit.   There is no weight on file to calculate BMI.    ECOG FS:1 - Symptomatic but completely ambulatory  Skin: warm, dry  HEENT: sclerae anicteric, conjunctivae pink, oropharynx clear. No thrush or mucositis. Currently in cervical collar. Lymph Nodes: No cervical or supraclavicular lymphadenopathy  Lungs:  clear to auscultation bilaterally, no rales, wheezes, or rhonci  Heart: regular rate and rhythm  Abdomen: round, soft, non tender, positive bowel sounds  Musculoskeletal: Pain along right side and back that radiates to thigh, no peripheral edema, gait disturbed, cane in use Neuro: non focal, well oriented, anxious affect   LAB RESULTS:  CMP     Component Value Date/Time   NA 139 04/16/2014 1444   NA 140 02/16/2014 0425   K 3.8 04/16/2014 1444   K 3.9 02/16/2014 0425   CL 103 02/16/2014 0425   CO2 21* 04/16/2014 1444   CO2 24 02/16/2014 0425   GLUCOSE 119 04/16/2014 1444   GLUCOSE 110* 02/16/2014 0425   BUN 9.2 04/16/2014 1444   BUN 16 02/16/2014 0425   CREATININE 0.9 04/16/2014 1444   CREATININE 0.84 02/21/2014 0403   CALCIUM 8.7 04/16/2014 1444   CALCIUM 8.5 02/16/2014 0425   PROT 5.7* 04/16/2014 1444   PROT 5.2* 02/12/2014 0405   ALBUMIN 3.2* 04/16/2014 1444   ALBUMIN 2.8* 02/12/2014 0405   AST 7 04/16/2014 1444   AST 13 02/12/2014 0405   ALT 11 04/16/2014 1444   ALT 15 02/12/2014 0405   ALKPHOS 93 04/16/2014 1444   ALKPHOS 89 02/12/2014 0405   BILITOT 0.21 04/16/2014 1444   BILITOT <0.2* 02/12/2014 0405   GFRNONAA >90 02/21/2014 0403   GFRAA >90 02/21/2014 0403    I No results found for this basename: SPEP,  UPEP,   kappa and lambda light chains    Lab Results  Component Value Date   WBC 9.1 04/16/2014   NEUTROABS 7.7* 04/16/2014   HGB 10.3* 04/16/2014   HCT 32.4* 04/16/2014   MCV 95.2 04/16/2014   PLT 260 04/16/2014      Chemistry      Component Value Date/Time   NA 139 04/16/2014 1444   NA 140 02/16/2014 0425   K 3.8 04/16/2014 1444   K 3.9 02/16/2014 0425   CL 103 02/16/2014 0425   CO2 21* 04/16/2014 1444   CO2 24 02/16/2014 0425   BUN 9.2 04/16/2014 1444   BUN 16 02/16/2014 0425   CREATININE 0.9 04/16/2014 1444   CREATININE 0.84 02/21/2014 0403      Component Value Date/Time   CALCIUM 8.7 04/16/2014 1444   CALCIUM 8.5 02/16/2014 0425   ALKPHOS 93 04/16/2014 1444   ALKPHOS 89 02/12/2014 0405   AST 7 04/16/2014 1444   AST 13  02/12/2014 0405   ALT 11 04/16/2014 1444   ALT 15 02/12/2014 0405   BILITOT 0.21 04/16/2014 1444   BILITOT <0.2* 02/12/2014 0405       No results found for this basename: LABCA2    No components found with this basename: LABCA125    No results found for this basename: INR,  in the last 168 hours  Urinalysis    Component Value Date/Time   COLORURINE YELLOW 02/07/2014 0355   APPEARANCEUR CLOUDY* 02/07/2014 0355   LABSPEC 1.011 02/07/2014 [place] 5.5 02/07/2014 [place] 02/07/2014 [place] 02/07/2014 [place] 02/07/2014 0355   KETONESUR 15* 02/07/2014 [place] 02/07/2014 0355   UROBILINOGEN 1.0 02/07/2014 0355   NITRITE NEGATIVE 02/07/2014 0355   LEUKOCYTESUR NEGATIVE 02/07/2014 0355    STUDIES:  Dg Chest 2 View  05/09/2014   CLINICAL DATA:  Metastatic thyroid cancer, following measurable disease.  EXAM: CHEST  2 VIEW  COMPARISON:  Chest radiograph February 06, 2014  FINDINGS: Cardiac silhouette appears mildly enlarged, similar to prior examination. Mediastinal silhouette is nonsuspicious. Diffuse bilateral pulmonary metastasis, there may be a few more small metastasis within the upper lobes. No pleural effusions. No pneumothorax. Re- demonstration of destructive metastasis in RIGHT scapula.  IMPRESSION: Diffuse pulmonary metastasis, suspected progression noted in the lung  apices with re- demonstration of lytic metastasis RIGHT scapula.   Electronically Signed   By: Elon Alas   On: 05/09/2014 15:58   Mr Brain W Wo Contrast  05/09/2014   CLINICAL DATA:  Thyroid cancer.  Prior brain lesion.  EXAM: MRI HEAD WITHOUT AND WITH CONTRAST  TECHNIQUE: Multiplanar, multiecho pulse sequences of the brain and surrounding structures were obtained without and with intravenous contrast.  CONTRAST:  84mL MULTIHANCE GADOBENATE DIMEGLUMINE 529 MG/ML IV SOLN  COMPARISON:  Head CT 02/06/2014 and MRI 12/26/2013  FINDINGS: Marrow changes in the  skullbase and may reflect prior radiation therapy. C3 osseous metastasis is incompletely imaged.  There is no acute infarct, intracranial hemorrhage, midline shift, or extra-axial fluid collection. There is mild generalized cerebral atrophy. T2 hyperintensity in the periventricular white matter may reflect posttherapy changes. Enhancing mass in the fourth ventricle measures 17 x 14 mm, not significantly changed (series 10, image 19). 7 x 7 mm enhancing focus in the atrium of the right lateral ventricle appears stable to slightly decreased in size. 3 mm lesion in the right central sulcus is slightly smaller (series 10, image 44). No new enhancing lesions are identified. Mild enlargement of the ventricles is unchanged.  Right frontal sinus mucosal thickening is unchanged, as are mucous retention cysts in the maxillary sinuses. Trace mastoid effusions are present, decreased from prior. Major intracranial vascular flow voids are preserved.  IMPRESSION: 1. Slightly decreased size enhancing lesion in the right central sulcus. No significant change in the right lateral ventricle and fourth ventricle lesions. 2. No new brain lesions.   Electronically Signed   By: Logan Bores   On: 05/09/2014 17:33   CT HEAD WITHOUT CONTRAST  TECHNIQUE:  Contiguous axial images were obtained from the base of the skull  through the vertex without intravenous contrast.  COMPARISON: CT head 01/16/2013. MRI brain 12/26/2013.  FINDINGS:  1.3 cm diameter mass lesion in the fourth ventricle consistent with  metastatic disease is enlarged since previous CT scan but appears  stable since the prior MRI. Tiny additional nodular lesions  demonstrated on the MRI are not appreciable on CT. Ventricles and  sulci appear otherwise symmetrical. No significant ventricular  dilatation. No abnormal extra-axial fluid collections. Gray-white  matter junctions are distinct. Basal cisterns are not effaced. No  evidence of acute intracranial  hemorrhage. Calvarium appears intact.  Opacification of the right frontal sinus with mucosal thickening in  the maxillary antra bilaterally. Mastoid air cells are not  opacified.  IMPRESSION:  Re identification of known mass in the fourth ventricle. No acute  intracranial abnormalities are suspected. Chronic inflammatory  changes in the sinuses.  Electronically Signed  By: Lucienne Capers M.D.  On: 02/06/2014 23:38  Angelita Ingles, MD Tue Jan 14, 2014 11:08:58 AM EDT       ADDENDUM REPORT: 01/14/2014 11:06  ADDENDUM:  After further investigation it has been determined that the patient  has in fact received prior radiolabeled I -31 therapy for treatment  of thyroid cancer metastasis. On 04/02/2013 at the patient recieved  355.7 millicuries of I- 322 sodium iodide. A pre therapy scan was  performed at this time which showed evidence of distant metastatic  disease involving the lungs and bones. Compared with the pre therapy  scan dated 03/26/2013 the degree of iodine avid tumor on the current  exam has improved. With the exception of the right iliac bone lesion  there has been decreased activity within the lungs and bone.  Electronically Signed  By: Kerby Moors M.D.  On: 01/14/2014 11:06      CLINICAL DATA: Evaluate for iodine avid metastasis.  EXAM:  NUCLEAR MEDICINE I-131 WHOLE BODY SCAN  TECHNIQUE:  The patient received 3.2 mCi I-131 sodium iodide for the treatment  of thyroid cancer within the past 10 days. The patient returns  today, and whole body scanning was performed in the anterior and  posterior projections.  COMPARISON: PET-CT from 01/03/2013  FINDINGS:  Small to medium size focus of mild increased uptake is identified  within the thyroid bed. There is a large focus of mild to moderate  radiotracer uptake within the right mid lung and right base. Within  the left midlung there is a medium size focus of moderate increased  radiotracer uptake. Within the right  side of posterior pelvis there  is a medium size focus of intense radiotracer uptake corresponding  to lytic lesion identified in the right iliac bone. Physiologic  tracer activity is seen within the GI tract.  IMPRESSION:  Iodine avid metastasis are identified within both lungs and right  pelvis. There is also residual iodine avid tumor and/or thyroid  tissue within the thyroid bed.  Electronically Signed:  By: Kerby Moors M.D.  On: 01/13/2014 16:07    ASSESSMENT: 43 y.o. with metastatic (Right) thyroid cancer diagnosed by paraspinal biopsy 01/11/2013 showing papillary thyroid carcinoma, columnar cell variant  (1) presented with back pain April 2014, staging studies showed lung and bone metastases  (2) s/p total thyroidectomy 01/29/2013 (Dr Beverly Gust)  (3) s/p I-131 , 252.3 mCu, 04/02/2013 (or 1.76 mCu 05/02/2013?)  A. Brain metastases noted on head CT 02/05/2013  (1) s/p palliative external beam radiation therapy to the whole brain/upper mid cervical spine, 3000 cGy in 10 sessions (09/04/2013 through 09/24/2013)  B. Spine lesions (has received radiation to C7-T3, T9, Right scapula, and T12-L2)  (1) s/p radiation to T9 (Dr Donella Stade) completed 02/12/2013  (2) Status post abbreviated palliative radiotherapy to the lower cervical/upper thoracic spine (C7-T3) receiving 2100 cGy in 7 sessions  (3) Status post  abbreviated course of palliative radiation therapy to the right scapula with the same fractionation. He did not receive his final 3 fractions of planned radiation therapy to his right scapula or lower cervical/upper thoracic spine (C7-T3). (Dr. [name])  (4) More recently, he completed treatment to his lower thoracic/upper lumbar spine (T12-L2) and sacrum, receiving 3000 cGy in 10 sessions completed  his therapy on 02/21/2014. (Dr. Valere Dross)  C. 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 Jana Hakim we are to  continue to manage his pain with the dexamethasone, MS contin, and dilaudid. I reviewed this strategy of pain management with him as it seems he was using the MS contin and dilaudid in reverse, not understanding that the MS contin is a long acting analgesic.   His scans showed that his brain metastases are decreasing, but the lung nodules are still present and progressing. They lytic metastasis on the right scaplula has "re demonstrated". He has not yet stared his q3 month zolendronate for these lytic bone lesions as he has missed his appointment today, but I will reschedule him for the first available. I will also have labs drawn before this visit as they were not obtained today because of tardiness.   Lopez asks to meet with Dr. Valere Dross about further treatments and to my understanding Dr. Valere Dross is available to see the patient when he requests. However he understands that  there may not be much more that can be done. I have referred him to social service support as he regularly cites financial concerns as a reason for missing appointments. He also needs help identifying a healthcare power of attorney.   Jas understands and agrees with this plan. He has been encouraged to call with any issues that might arise before his next visit here.  Marcelino Duster, NP   05/14/2014 5:04 PM

## 2014-05-14 NOTE — ED Provider Notes (Signed)
Medical screening examination/treatment/procedure(s) were performed by non-physician practitioner and as supervising physician I was immediately available for consultation/collaboration.   EKG Interpretation None       Babette Relic, MD 05/14/14 1334

## 2014-05-14 NOTE — Telephone Encounter (Signed)
per pof to sch pt appt-HF has adv pt of appt-Melissa sch trmt

## 2014-05-16 ENCOUNTER — Other Ambulatory Visit: Payer: Self-pay | Admitting: *Deleted

## 2014-05-16 ENCOUNTER — Other Ambulatory Visit: Payer: Self-pay | Admitting: Nurse Practitioner

## 2014-05-16 ENCOUNTER — Ambulatory Visit (HOSPITAL_COMMUNITY)
Admission: RE | Admit: 2014-05-16 | Discharge: 2014-05-16 | Disposition: A | Discharge status: Home / Self Care | Payer: Medicaid Other | Source: Ambulatory Visit | Attending: Oncology | Admitting: Oncology

## 2014-05-16 ENCOUNTER — Ambulatory Visit (HOSPITAL_BASED_OUTPATIENT_CLINIC_OR_DEPARTMENT_OTHER): Payer: Medicaid Other

## 2014-05-16 ENCOUNTER — Other Ambulatory Visit (HOSPITAL_BASED_OUTPATIENT_CLINIC_OR_DEPARTMENT_OTHER): Payer: Medicaid Other

## 2014-05-16 VITALS — BP 139/82 | HR 102 | Temp 97.7°F | Resp 20

## 2014-05-16 DIAGNOSIS — C73 Malignant neoplasm of thyroid gland: Secondary | ICD-10-CM | POA: Insufficient documentation

## 2014-05-16 DIAGNOSIS — C7951 Secondary malignant neoplasm of bone: Secondary | ICD-10-CM

## 2014-05-16 DIAGNOSIS — C7952 Secondary malignant neoplasm of bone marrow: Secondary | ICD-10-CM

## 2014-05-16 DIAGNOSIS — M25551 Pain in right hip: Secondary | ICD-10-CM | POA: Diagnosis present

## 2014-05-16 DIAGNOSIS — C7931 Secondary malignant neoplasm of brain: Secondary | ICD-10-CM

## 2014-05-16 LAB — COMPREHENSIVE METABOLIC PANEL (CC13)
ALT: 18 U/L (ref 0–55)
AST: 10 U/L (ref 5–34)
Albumin: 3.3 g/dL — ABNORMAL LOW (ref 3.5–5.0)
Alkaline Phosphatase: 85 U/L (ref 40–150)
Anion Gap: 10 mEq/L (ref 3–11)
BUN: 10.4 mg/dL (ref 7.0–26.0)
CO2: 24 mEq/L (ref 22–29)
CREATININE: 0.9 mg/dL (ref 0.7–1.3)
Calcium: 9.2 mg/dL (ref 8.4–10.4)
Chloride: 107 mEq/L (ref 98–109)
Glucose: 91 mg/dl (ref 70–140)
POTASSIUM: 3.8 meq/L (ref 3.5–5.1)
Sodium: 141 mEq/L (ref 136–145)
Total Bilirubin: 0.38 mg/dL (ref 0.20–1.20)
Total Protein: 6 g/dL — ABNORMAL LOW (ref 6.4–8.3)

## 2014-05-16 LAB — CBC WITH DIFFERENTIAL/PLATELET
BASO%: 0.3 % (ref 0.0–2.0)
BASOS ABS: 0 10*3/uL (ref 0.0–0.1)
EOS ABS: 0 10*3/uL (ref 0.0–0.5)
EOS%: 0.7 % (ref 0.0–7.0)
HCT: 35.7 % — ABNORMAL LOW (ref 38.4–49.9)
HGB: 11.4 g/dL — ABNORMAL LOW (ref 13.0–17.1)
LYMPH#: 0.5 10*3/uL — AB (ref 0.9–3.3)
LYMPH%: 10.3 % — ABNORMAL LOW (ref 14.0–49.0)
MCH: 30.8 pg (ref 27.2–33.4)
MCHC: 31.9 g/dL — ABNORMAL LOW (ref 32.0–36.0)
MCV: 96.4 fL (ref 79.3–98.0)
MONO#: 0.5 10*3/uL (ref 0.1–0.9)
MONO%: 10.4 % (ref 0.0–14.0)
NEUT%: 78.3 % — ABNORMAL HIGH (ref 39.0–75.0)
NEUTROS ABS: 3.5 10*3/uL (ref 1.5–6.5)
Platelets: 237 10*3/uL (ref 140–400)
RBC: 3.7 10*6/uL — ABNORMAL LOW (ref 4.20–5.82)
RDW: 17.1 % — AB (ref 11.0–14.6)
WBC: 4.4 10*3/uL (ref 4.0–10.3)

## 2014-05-16 LAB — TSH CHCC: TSH: 19.966 m(IU)/L — ABNORMAL HIGH (ref 0.320–4.118)

## 2014-05-16 LAB — TECHNOLOGIST REVIEW

## 2014-05-16 MED ORDER — HYDROMORPHONE HCL 4 MG PO TABS: 4.0000 mg | ORAL_TABLET | ORAL | Status: DC | PRN

## 2014-05-16 MED ORDER — ZOLEDRONIC ACID 4 MG/100ML IV SOLN
4.0000 mg | Freq: Once | INTRAVENOUS | Status: AC
  Administered 2014-05-16: 4 mg via INTRAVENOUS
  Filled 2014-05-16: qty 100

## 2014-05-16 MED ORDER — HEPARIN SOD (PORK) LOCK FLUSH 100 UNIT/ML IV SOLN
250.0000 [IU] | Freq: Once | INTRAVENOUS | Status: DC | PRN
  Filled 2014-05-16: qty 5

## 2014-05-16 MED ORDER — MORPHINE SULFATE ER 60 MG PO TBCR: 60.0000 mg | EXTENDED_RELEASE_TABLET | Freq: Two times a day (BID) | ORAL | Status: DC

## 2014-05-16 MED ORDER — SODIUM CHLORIDE 0.9 % IV SOLN
Freq: Once | INTRAVENOUS | Status: AC
  Administered 2014-05-16: 14:00:00 via INTRAVENOUS

## 2014-05-16 NOTE — Patient Instructions (Signed)

## 2014-05-17 LAB — THYROGLOBULIN LEVEL: THYROGLOBULIN: 35454.3 ng/mL — AB (ref 2.8–40.9)

## 2014-05-19 LAB — THYROGLOBULIN ANTIBODY

## 2014-05-22 ENCOUNTER — Telehealth: Payer: Self-pay | Admitting: *Deleted

## 2014-05-22 ENCOUNTER — Other Ambulatory Visit: Payer: Self-pay | Admitting: *Deleted

## 2014-05-22 ENCOUNTER — Encounter: Payer: Self-pay | Admitting: *Deleted

## 2014-05-22 NOTE — Telephone Encounter (Signed)
This RN called pt per MD review of recent elevated TSH -  Per Le - he states " I am taking my synthroid "  Per phone discussion pt obtained bottle and stated it says " 100cmg " He denies having any other bottles of synthroid available.  Per MD recommendation dose needs to be 113mcg.  Yandell verified pharmacy for new prescription as [facility].  This RN contacted pt's pharmacy and verified last fill and dose.  Noted as last filled in Feb of 2015 as 158mcg tablets.  This RN gave new prescription per MD as above.

## 2014-05-22 NOTE — Progress Notes (Signed)
[place] Work  Clinical Social Work was referred by patient for assessment of psychosocial needs due to request to complete ADRs.  Clinical Social Ward contacted patient at home to discuss ADRs. Pt has an ADR and HCPOA paperwork in his medical record under media tab that is dated 6/15. Pt reports he would like to make changes to his ADR/HCPOA choices and add his mother to the paperwork. Pt plans to come in on next Tuesday, October 13. Pt reports to have a packet and will meet with CSW at his appointment to complete a new packet. CSW educated pt that he would need to redo his ADRs and would have to get this notarized. He stated understanding and plans to bring his packet on Tuesday.   Clinical Social Work interventions: ADR education  Frank Ward, Frank Ward [place]  [place] Phone: [phone] Fax: [phone]

## 2014-05-22 NOTE — Telephone Encounter (Signed)
Received call from patient stating he had scan done yesterday at [facility]. He states Dr Bary Richard, nuclear med dept "would like to talk to Dr Valere Dross". Mr Taras states that he has "something on his spine that needs radiation". Dr Quin Hoop can be reached at [phone]. This RN spoke with Dr Howie Ill who verified Mr Dobrowski had Thyroid Metastatic scan on 05/21/14. He requests to speak with Dr Valere Dross re: if pt can receive radiation treatment before radioactive iodine. He states there is a "large lesion at L1." Informed Dr Howie Ill that Dr Valere Dross will be in the office this afternoon, and this RN will relay his request to Dr Valere Dross. Dr Howie Ill verbalized understanding.

## 2014-05-27 ENCOUNTER — Ambulatory Visit: Admission: RE | Admit: 2014-05-27 | Payer: Medicaid Other | Source: Ambulatory Visit | Admitting: Radiation Oncology

## 2014-05-27 ENCOUNTER — Telehealth: Payer: Self-pay | Admitting: *Deleted

## 2014-05-27 NOTE — Telephone Encounter (Signed)
Received vm from pt earlier stating he needed to reschedule his FU today due to leg weakness. Attempted to reach pt at phone # he left; no answer, no vm. 3:13 pm Received call from patient who states "I have good days and bad days. Today is not a good day. I want to reschedule my appointment. Can I come in tomorrow?" Rescheduled pt's FU for tomorrow at 11:00 am; pt verbalized agreement.

## 2014-05-28 ENCOUNTER — Other Ambulatory Visit: Payer: Self-pay | Admitting: Emergency Medicine

## 2014-05-28 ENCOUNTER — Encounter: Payer: Self-pay | Admitting: Radiation Oncology

## 2014-05-28 ENCOUNTER — Ambulatory Visit
Admission: RE | Admit: 2014-05-28 | Discharge: 2014-05-28 | Disposition: A | Discharge status: Home / Self Care | Payer: Medicaid Other | Source: Ambulatory Visit | Attending: Radiation Oncology | Admitting: Radiation Oncology

## 2014-05-28 VITALS — BP 124/70 | HR 120 | Temp 97.9°F | Ht 76.0 in | Wt 192.9 lb

## 2014-05-28 DIAGNOSIS — C7951 Secondary malignant neoplasm of bone: Secondary | ICD-10-CM

## 2014-05-28 DIAGNOSIS — C73 Malignant neoplasm of thyroid gland: Secondary | ICD-10-CM

## 2014-05-28 DIAGNOSIS — C7952 Secondary malignant neoplasm of bone marrow: Secondary | ICD-10-CM

## 2014-05-28 MED ORDER — TRAZODONE HCL 100 MG PO TABS
100.0000 mg | ORAL_TABLET | Freq: Every evening | ORAL | Status: DC | PRN
Start: 2014-05-28 — End: 2014-07-01

## 2014-05-28 MED ORDER — HYDROMORPHONE HCL 4 MG PO TABS: 4.0000 mg | ORAL_TABLET | ORAL | Status: DC | PRN

## 2014-05-28 NOTE — Progress Notes (Signed)
Frank Ward here today for assessment following news of progression of bone metastases.  Grades pain in his lower back and bilateral hips as a level 8/10.  He states desire to have more radiation therapy and appears anxious

## 2014-05-28 NOTE — Progress Notes (Signed)
[facility] Follow up Note  Name: Frank Ward   Date:   05/28/2014 MRN:  [id number] DOB: 1971/03/31   CC:  Dr. 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 History of palliative external beam radiation therapy to the whole brain/upper mid cervical spine, 3000 cGy in 10 sessions (09/04/2013 through 09/24/2013) also status post abbreviated palliative radiotherapy to the lower cervical/upper thoracic spine (C7-T3) receiving 2100 cGy in 7 sessions and also abbreviated course of palliative radiation therapy to the right scapula with the same fractionation. He did not receive his final 3 fractions of planned radiation therapy to his right scapula or lower cervical/upper thoracic spine (C7-T3). His status post radiation therapy to his upper lumbar spine (T12-L2) and sacrum as described above receiving 3000 cGy in 10 sessions completing this on 02/21/2014.    ALLERGIES: Penicillins; Latex; and Lyrica   MEDICATIONS:  Current Outpatient Prescriptions  Medication Sig Dispense Refill  . dexamethasone (DECADRON) 4 MG tablet Take 1 tablet (4 mg total) by mouth 2 (two) times daily.  60 tablet  0  . docusate sodium (COLACE) 100 MG capsule Take 100 mg by mouth 2 (two) times daily as needed for mild constipation.      . furosemide (LASIX) 20 MG tablet Take 1 tablet (20 mg total) by mouth daily.  30 tablet  1  . ibuprofen (ADVIL,MOTRIN) 200 MG tablet Take 200-400 mg by mouth every 6 (six) hours as needed for mild pain.      Marland Kitchen levothyroxine (SYNTHROID,  LEVOTHROID) 125 MCG tablet Take 1 tablet (125 mcg total) by mouth daily before breakfast.  30 tablet  6  . LORazepam (ATIVAN) 1 MG tablet Take 1 tablet (1 mg total) by mouth every 8 (eight) hours as needed for anxiety.  15 tablet  0  . morphine (MS CONTIN) 60 MG 12 hr tablet Take 1 tablet (60 mg total) by mouth every 12 (twelve) hours.  60 tablet  0  . pantoprazole (PROTONIX) 40 MG tablet Take 1 tablet (40 mg total) by mouth daily.  30 tablet  0  . QUEtiapine (SEROQUEL XR) 50 MG TB24 24 hr tablet Take 2 tablets (100 mg total) by mouth daily.  30 each  0  . HYDROmorphone (DILAUDID) 4 MG tablet Take 1 tablet (4 mg total) by mouth every 4 (four) hours as needed.  40 tablet  0  . traZODone (DESYREL) 100 MG tablet Take 1 tablet (100 mg total) by mouth at bedtime as needed for sleep.  30 tablet  0   No current facility-administered medications for this encounter.     NARRATIVE: Mr. Bodi visits today after being recently evaluated at [facility] by Dr. Howie Ill [phone]) of nuclear medicine and was last week. He had a thyroid scan with Thyrogen and did have uptake throughout the bony skeleton with the varying degrees of iodine avidity. The most avid lesion was in the right pelvis centered near the SI joint. There was bony destruction seen on CT and plain film. There was less intense uptake along the right iliac wing extending to the superior acetabulum and in the left  sacrum, and L2, and in T2. The L1 lesion involves mainly the right posterior elements it is associated with extensive osseus destruction and a bulky soft tissue mass. Extent of involvement of the spinal cord was indeterminate.  A lytic lesion was seen in the T9 vertebral body and was not avid. The lesion appeared to be smaller, consistent with previous external beam radiation therapy. Other lesions seen included the left T12 vertebral body, the lesions in the posterior left ischium and right scapula extending to the glenoid and these were not avid.  Of note is that Dr. Jana Hakim obtain a plain film of his pelvis on October 2 and a large lytic destructive lesion was seen to involve the right iliac bone above the acetabulum and another lytic lesion seen involving the left ischial tuberosity. He reports bilateral pelvic discomfort concentrated along his right iliac bone and left ischium. There is no change in his lower extremity weakness/symptomatology. He is able to and late with a cane. He drives a car. No difficulty with bladder or bowel habits. He tells me that he was scheduled for radioactive iodine therapy, but he is not sure if he missed the appointment. He also asks for a refill of his pain medication. He shows up today one hour late for his appointment.   PHYSICAL EXAM:   height is 6\' 4"  (1.93 m) and weight is 192 lb 14.4 oz (87.499 kg). His temperature is 97.9 F (36.6 C). His blood pressure is 124/70 and his pulse is 120.  Alert and oriented, but he does have free association. He directs any conversation to what he wants to talk about. He was a cervical collar. On palpation of the spine there is slight discomfort on palpation of the L1-L2 vertebra. There is also palpation on the right acetabulum and left ischium. Neurologic examination is unchanged.   LABORATORY DATA:  Lab Results  Component Value Date   WBC 4.4 05/16/2014   HGB 11.4* 05/16/2014   HCT 35.7* 05/16/2014   MCV 96.4 05/16/2014   PLT 237 05/16/2014   Lab Results  Component Value Date   NA 141 05/16/2014   K 3.8 05/16/2014   CL 103 02/16/2014   CO2 24 05/16/2014   Lab Results  Component Value Date   ALT 18 05/16/2014   AST 10 05/16/2014   ALKPHOS 85 05/16/2014   BILITOT 0.38 05/16/2014         IMPRESSION: Metastatic papillary thyroid carcinoma to bone with varying degrees of iodine uptake. Dr. Howie Ill feels that he would be an appropriate candidate for radioactive iodine therapy. Based on his plain x-rays and his thyroid scan report, I feel that he would be a candidate  for a brief course of radiation therapy to his right acetabulum/iliac bone and left ischium. There would be some overlap with his previously treated sacral with treatment to his right acetabulum/iliac bone, but I think we can give a palliative dose. We discussed the potential acute and late toxicities of radiation therapy. He tells me that he is willing to accept any palliative  retreatment regardless of the risk for complications. Consent is signed today. He will return for simulation/treatment planning tomorrow. He may be a radiosurgery candidate for treatment to his L1 vertebra if he indeed has disease progression. We may go ahead and get a CT scan of his lumbar spine. I'll speak with my colleagues to see if other studies would be necessary. Lastly, he understands that Dr. Jana Hakim will prescribe his pain medications.  PLAN: As discussed above.   I spent 30  minutes face to face with the patient and more than 50% of that time was spent in counseling and/or coordination of care.

## 2014-05-29 ENCOUNTER — Encounter: Payer: Self-pay | Admitting: *Deleted

## 2014-05-29 ENCOUNTER — Ambulatory Visit: Admission: RE | Admit: 2014-05-29 | Payer: Medicaid Other | Source: Ambulatory Visit | Admitting: Radiation Oncology

## 2014-05-29 NOTE — Progress Notes (Signed)
[facility] Psychosocial Distress Screening Clinical Social Work  Clinical Social Work was referred by distress screening protocol.  The patient scored a 8 on the Psychosocial Distress Thermometer which indicates severe distress. Clinical Social Worker phoned pt and reviewed chart to assess for distress and other psychosocial needs. CSW had planned to meet with pt at his appointment to complete ADRs, but pt changed appointment and then CSW was not available at the new appointment time. Pain needs were addressed at visit. CSW also received referral to look into housing concerns. CSW left message for pt and awaits return call.   ONCBCN DISTRESS SCREENING 05/28/2014  Screening Type Patient Declined  Elta [place] the number that describes how much distress you have been experiencing in the past week 8  Physical Problem type Pain;Tingling hands/feet;Getting around  Physician notified of physical symptoms Yes  Referral to clinical social work No    Clinical Social Worker follow up needed: Yes.    If yes, follow up plan: Awaits return call and will try to see at future appointments. Loren Racer, [place] Worker [place]  [place] Phone: [phone] Fax: [phone]

## 2014-05-29 NOTE — Progress Notes (Signed)
This encounter was created in error - please disregard.

## 2014-06-02 ENCOUNTER — Ambulatory Visit
Admission: RE | Admit: 2014-06-02 | Discharge: 2014-06-02 | Disposition: A | Discharge status: Home / Self Care | Payer: Medicaid Other | Source: Ambulatory Visit | Attending: Radiation Oncology | Admitting: Radiation Oncology

## 2014-06-02 DIAGNOSIS — C73 Malignant neoplasm of thyroid gland: Secondary | ICD-10-CM | POA: Diagnosis not present

## 2014-06-02 DIAGNOSIS — Z510 Encounter for antineoplastic radiation therapy: Secondary | ICD-10-CM | POA: Diagnosis not present

## 2014-06-02 DIAGNOSIS — C7952 Secondary malignant neoplasm of bone marrow: Secondary | ICD-10-CM

## 2014-06-02 DIAGNOSIS — C7951 Secondary malignant neoplasm of bone: Secondary | ICD-10-CM | POA: Insufficient documentation

## 2014-06-02 NOTE — Progress Notes (Signed)
CC: Dr. [name] , Wadie Lessen, NP  Complex simulation/treatment planning note: The patient was taken to the CT simulator. A Vaculoc immobilization device was constructed. His pelvis and LS spine were scanned. Our plan was to treat his right iliac wing and also left ischium. Also scanned from T9 through the pelvis. Isocenters were placed along the right iliac bone and also left ischium. The CT data set was sent to the planning system were contoured his right iliac disease and also his left ischial disease. It is noted that there were significant overlap of disease along the right ilium with his previously treated sacral field, and thus he would not be a candidate for conventional radiation therapy. He was set up to LAO and RPO to his left ischial disease with 2 sets of multileaf collimators used to conform the field. I plan to deliver 800 cGy in a single fraction to his left ischium. I also reviewed his simulation CT which shows significant bone destruction at T9 and also L1 were he previously received 3000 cGy in 10 sessions. I spoke with Dr. [name] regarding the possibility of radiosurgery to one or both of these sites. Dr. [name] will present him at the radiosurgery conference, and we hope to have him also seen by Wadie Lessen, P.A of palliative care. Dr. [name] felt that we probably need to look at his entire spine, including the cervical spine where he had retreatment of cervical spine disease. Of note is that he did have a recent thyroid scan at [facility] where he had varying degrees of uptake in sites of metastatic disease. This report is currently being scanned. The T9 lesion was not very avid. Avid uptake was seen in the right pelvis adjacent to the SI joint. I tried to call the patient this evening and I did leave a message to see how aggressive he would like to be. I find it remarkable that he is still able to ambulate with  significant destruction along multiple sites of his cervical, thoracic, lumbar, and  sacral spine. Of course, he is taking quite a bit of narcotics but he is still quite functional.

## 2014-06-03 ENCOUNTER — Encounter: Payer: Self-pay | Admitting: Radiation Oncology

## 2014-06-03 DIAGNOSIS — Z510 Encounter for antineoplastic radiation therapy: Secondary | ICD-10-CM | POA: Diagnosis not present

## 2014-06-03 NOTE — Progress Notes (Signed)
Treatment planning note patient is setup to fields to treat his left ischium. He does have a sagittal wedge representing a complex treatment devices for a total of 3 complex treatment devices including his Vac-Loc immobilization.

## 2014-06-04 ENCOUNTER — Ambulatory Visit (HOSPITAL_COMMUNITY): Payer: Medicaid Other

## 2014-06-04 ENCOUNTER — Telehealth: Payer: Self-pay | Admitting: *Deleted

## 2014-06-04 NOTE — Telephone Encounter (Signed)
Called patient to inform that scan has been cancelled for today due to insurance, waiting for word from Dr. Valere Dross to reschedule this test, spoke with patient and he veralbized under standing this

## 2014-06-09 ENCOUNTER — Telehealth: Payer: Self-pay | Admitting: *Deleted

## 2014-06-09 ENCOUNTER — Ambulatory Visit: Admission: RE | Admit: 2014-06-09 | Payer: Medicaid Other | Source: Ambulatory Visit | Admitting: Radiation Oncology

## 2014-06-09 ENCOUNTER — Ambulatory Visit: Payer: Medicaid Other | Admitting: Radiation Oncology

## 2014-06-09 NOTE — Telephone Encounter (Signed)
Received call from patient stating "someone called him today and told him his appointment today was cancelled and rescheduled for Wed". Informed patient that according to his schedule, he did not have an appointment cancelled today. This RN explained that he had radiation appointment today and FU appt Wed with Dr [name] that was requested by Dr Valere Dross. Patient interrupted this RN several times insisting that "someone is trying to confuse me". This RN offered to transfer his call to [facility] where he is being treated, and Frank Ward hung up the phone. [facility] RT sent e mail stating patient was no show today, and they were unable to reach him by phone. Dr Valere Dross is aware.

## 2014-06-10 ENCOUNTER — Encounter: Payer: Self-pay | Admitting: Radiation Oncology

## 2014-06-10 ENCOUNTER — Ambulatory Visit
Admission: RE | Admit: 2014-06-10 | Discharge: 2014-06-10 | Disposition: A | Discharge status: Home / Self Care | Payer: Medicaid Other | Source: Ambulatory Visit | Attending: Radiation Oncology | Admitting: Radiation Oncology

## 2014-06-10 ENCOUNTER — Ambulatory Visit: Payer: Medicaid Other

## 2014-06-10 ENCOUNTER — Inpatient Hospital Stay
Admission: RE | Admit: 2014-06-10 | Discharge: 2014-06-10 | Disposition: A | Discharge status: Home / Self Care | Payer: Self-pay | Source: Ambulatory Visit | Attending: Radiation Oncology | Admitting: Radiation Oncology

## 2014-06-10 VITALS — BP 130/75 | HR 88 | Resp 16 | Wt 180.1 lb

## 2014-06-10 DIAGNOSIS — Z923 Personal history of irradiation: Secondary | ICD-10-CM

## 2014-06-10 DIAGNOSIS — C7951 Secondary malignant neoplasm of bone: Secondary | ICD-10-CM

## 2014-06-10 DIAGNOSIS — C7952 Secondary malignant neoplasm of bone marrow: Principal | ICD-10-CM

## 2014-06-10 DIAGNOSIS — Z510 Encounter for antineoplastic radiation therapy: Secondary | ICD-10-CM | POA: Diagnosis not present

## 2014-06-10 HISTORY — DX: Personal history of irradiation: Z92.3

## 2014-06-10 NOTE — Progress Notes (Signed)
Simulation verification note: The patient underwent simulation verification for treatment to his left ischium. His isocenter was in good position and the multileaf collimators contoured the treatment volume appropriately.

## 2014-06-10 NOTE — Progress Notes (Signed)
Patient well dressed and composed today. Pleasant affect. States, "today is a good day." Reports generalized pain 3 on a scale of 0-10. Neck brace noted. Patient ambulating with aid of a cane.

## 2014-06-10 NOTE — Progress Notes (Signed)
[facility] End of Treatment Note  Name:Frank Ward  Date: 06/10/2014 PQD:[id number] DOB:[birth year]   Status:outpatient    CC: No PCP Per Patient  Dr. Gunnar Bulla Magrinat, Dr. [name]  REFERRING PHYSICIAN:  Dr. Gunnar Bulla Magrinat   DIAGNOSIS:  Metastatic papillary adenocarcinoma the thyroid to bone  INDICATION FOR TREATMENT: Palliative   TREATMENT DATE:  06/10/2014                         SITE/DOSE: Left ischium, 800 cGy in a single fraction                           BEAMS/ENERGY:   Oblique fields with 10 MV photons                NARRATIVE:   Frank Ward tolerated his single fraction of radiation therapy without difficulty.                         PLAN: He will see Dr. [name] for a follow-up visit tomorrow for discussion of possible radiosurgery to his spine and right pelvis.

## 2014-06-10 NOTE — Progress Notes (Addendum)
Weekly Management Note:  Site: Left ischium Current Dose:  800  cGy Projected Dose: 800  cGy  Narrative: The patient is seen today for routine under treatment assessment. CBCT/MVCT images/port films were reviewed. The chart was reviewed.   No new complaints today.  Physical Examination:  Filed Vitals:   06/10/14 1642  BP: 130/75  Pulse: 88  Resp: 16  .  Weight: 180 lb 1.6 oz (81.693 kg). No change.  Impression: Radiation well tolerated.  Plan: He will see Dr. [name] for discussion of possible SBRT with a follow-up visit tomorrow at 3 PM.

## 2014-06-11 ENCOUNTER — Encounter: Payer: Self-pay | Admitting: *Deleted

## 2014-06-11 ENCOUNTER — Other Ambulatory Visit: Payer: Self-pay | Admitting: *Deleted

## 2014-06-11 ENCOUNTER — Ambulatory Visit: Payer: Medicaid Other

## 2014-06-11 ENCOUNTER — Ambulatory Visit
Admission: RE | Admit: 2014-06-11 | Discharge: 2014-06-11 | Disposition: A | Discharge status: Home / Self Care | Payer: Medicaid Other | Source: Ambulatory Visit | Attending: Radiation Oncology | Admitting: Radiation Oncology

## 2014-06-11 ENCOUNTER — Ambulatory Visit
Admission: RE | Admit: 2014-06-11 | Discharge: 2014-06-11 | Disposition: A | Discharge status: Home / Self Care | Payer: Medicaid Other | Source: Ambulatory Visit | Attending: Internal Medicine | Admitting: Internal Medicine

## 2014-06-11 ENCOUNTER — Encounter: Payer: Self-pay | Admitting: Radiation Oncology

## 2014-06-11 VITALS — BP 100/70 | HR 95 | Temp 97.9°F | Resp 16 | Wt 180.0 lb

## 2014-06-11 DIAGNOSIS — C7931 Secondary malignant neoplasm of brain: Secondary | ICD-10-CM

## 2014-06-11 DIAGNOSIS — C7952 Secondary malignant neoplasm of bone marrow: Secondary | ICD-10-CM

## 2014-06-11 DIAGNOSIS — C7951 Secondary malignant neoplasm of bone: Secondary | ICD-10-CM

## 2014-06-11 DIAGNOSIS — C73 Malignant neoplasm of thyroid gland: Secondary | ICD-10-CM

## 2014-06-11 MED ORDER — DEXAMETHASONE 4 MG PO TABS: 4.0000 mg | ORAL_TABLET | Freq: Every morning | ORAL | Status: DC

## 2014-06-11 MED ORDER — HYDROMORPHONE HCL 4 MG PO TABS: 4.0000 mg | ORAL_TABLET | ORAL | Status: DC | PRN

## 2014-06-11 MED ORDER — MORPHINE SULFATE ER 60 MG PO TBCR: 60.0000 mg | EXTENDED_RELEASE_TABLET | Freq: Two times a day (BID) | ORAL | Status: DC

## 2014-06-11 NOTE — Progress Notes (Signed)
Radiation Oncology         [phone] ________________________________  Name: Frank Ward MRN: [id number]  Date: 06/11/2014  DOB: Feb 19, 1971  Follow-Up Visit Note  CC: No PCP Per Patient  No ref. provider found  Diagnosis:   Metastatic thyroid cancer   Narrative:  The patient returns today for routine follow-up.  The patient has recently completed palliative radiation treatment to the pelvis in our clinic through Dr. Valere Dross. The patient states that he did fine with this treatment without any related complaints. Concern has been regarding lesions within the spine as the patient has had significant multifocal disease at various levels within the spine. He has received multiple radiation treatments to the spine and these have been documented in the medical chart.  The patient continues to complain of significant pain, especially with ambulation. He denies any significant pain at rest when we discussed this issue today. However, the patient does have pain within the right hip extending to the right lower extremity and he also has some lower back pain. He also has pain in the right shoulder and he describes a feeling of weakness all over. He denies any bowel or bladder dysfunction.                               ALLERGIES:  is allergic to penicillins; latex; and lyrica.  Meds: Current Outpatient Prescriptions  Medication Sig Dispense Refill  . docusate sodium (COLACE) 100 MG capsule Take 100 mg by mouth 2 (two) times daily as needed for mild constipation.      . furosemide (LASIX) 20 MG tablet Take 1 tablet (20 mg total) by mouth daily.  30 tablet  1  . HYDROmorphone (DILAUDID) 4 MG tablet Take 1 tablet (4 mg total) by mouth every 4 (four) hours as needed.  40 tablet  0  . ibuprofen (ADVIL,MOTRIN) 200 MG tablet Take 200-400 mg by mouth every 6 (six) hours as needed for mild pain.      Marland Kitchen levothyroxine (SYNTHROID, LEVOTHROID) 125 MCG tablet Take 1 tablet (125 mcg total) by mouth daily  before breakfast.  30 tablet  6  . LORazepam (ATIVAN) 1 MG tablet Take 1 tablet (1 mg total) by mouth every 8 (eight) hours as needed for anxiety.  15 tablet  0  . morphine (MS CONTIN) 60 MG 12 hr tablet Take 1 tablet (60 mg total) by mouth every 12 (twelve) hours.  60 tablet  0  . pantoprazole (PROTONIX) 40 MG tablet Take 1 tablet (40 mg total) by mouth daily.  30 tablet  0  . QUEtiapine (SEROQUEL XR) 50 MG TB24 24 hr tablet Take 2 tablets (100 mg total) by mouth daily.  30 each  0  . traZODone (DESYREL) 100 MG tablet Take 1 tablet (100 mg total) by mouth at bedtime as needed for sleep.  30 tablet  0  . dexamethasone (DECADRON) 4 MG tablet Take 1 tablet (4 mg total) by mouth every morning.  30 tablet  0   No current facility-administered medications for this encounter.    Physical Findings: The patient is in no acute distress. Patient is alert and oriented.  weight is 180 lb (81.647 kg). His oral temperature is 97.9 F (36.6 C). His blood pressure is 100/70 and his pulse is 95. His respiration is 16 and oxygen saturation is 100%. .     Lab Findings: Lab Results  Component Value Date  WBC 4.4 05/16/2014   HGB 11.4* 05/16/2014   HCT 35.7* 05/16/2014   MCV 96.4 05/16/2014   PLT 237 05/16/2014     Radiographic Findings: Dg Hip Complete Right  05/16/2014   CLINICAL DATA:  Right hip pain, primary malignant neoplasm of thyroid gland.  EXAM: RIGHT HIP - COMPLETE 2+ VIEW  COMPARISON:  Dec 29, 2013.  FINDINGS: Large lytic destructive lesion is seen involving the right iliac bone above the acetabulum. Another lytic lesion is seen involving the left ischial tuberosity. No acute fracture or dislocation is noted. No significant degenerative changes seen involving the right hip joint.  IMPRESSION: Lytic lesions are seen involving the right iliac bone above the acetabulum as well as the left ischial tuberosity consistent with metastatic lesions.   Electronically Signed   By: Sabino Dick M.D.   On:  05/16/2014 15:48    Impression:    The patient's case was discussed in multidisciplinary brain and spine, this morning. A lesion which continues to be worrisome at T9 was discussed in addition to significant lytic disease lower in the lumbar spine and sacrum. He also has a significant history of disease more superiorly. It was felt that additional imaging is required to evaluate the patient for possible spine radiosurgery.  Plan:  The patient will proceed with an MRI scan of the spine and we will proceed accordingly. I discussed this plan with the patient. He wishes to return to clinic for follow-up after this has been completed.   Jodelle Gross, M.D., Ph.D.

## 2014-06-11 NOTE — Progress Notes (Signed)
This RN spoke with pt per refill for pain medications- Frank Ward states he is unable to purchase medication until Friday when his disability check is received.  This RN spoke with financial counseling who states pt can bring in a letter from family member he is staying with verifying income and financial need pt can receive a $ 400 one time medication allowance to be used at the [facility] outpatient pharmacy.  Note pt has medicaid but states " I only get $ 382 a month on disability " .  This RN then spoke with Ander Purpura and Abby Potash in Social Work and was able to obtain a $ [employer] card for use.  Abby Potash will follow up with pt and additional needs as they arise.  This RN spoke with pt per above and gave him written instructions stating how to obtain the $400 assistance.  Prescriptions for pain meds given to patient as well as informed him decadron refill electronically with decreased dosing.  No other needs at this time.

## 2014-06-11 NOTE — Progress Notes (Signed)
Patient seen by Wadie Lessen, NP for palliative care. Also, patient seen by Dr. Jana Hakim. Pain medications refilled by Dr. Jana Hakim. Patient denies pain while sitting. Reports pain upon standing and with ambulation 7 on a scale of 0-10. Neck brace noted. Vitals and weight stable. Pleasant affect noted.

## 2014-06-12 ENCOUNTER — Telehealth: Payer: Self-pay | Admitting: *Deleted

## 2014-06-12 ENCOUNTER — Ambulatory Visit: Payer: Medicaid Other

## 2014-06-12 ENCOUNTER — Encounter: Payer: Self-pay | Admitting: *Deleted

## 2014-06-12 NOTE — Telephone Encounter (Signed)
CALLED PATIENT TO INFORM OF MRI FOR 06-18-14 - ARRIVAL TIME - 6:45 PM- NO RESTRICTIONS, LVM FOR A RETURN CALL

## 2014-06-12 NOTE — Progress Notes (Signed)
[place] Work  Clinical Social Work was referred by patient for assessment of psychosocial needs due to request to complete ADRs.  Clinical Social Worker met with pt in [place] after his appointment. He stated he was not ready to complete at this time. CSW provided packet and again explained role of CSW. Pt aware to reach out to CSW next week if he would like to complete then. Pt appreciative and reports he was having a good day.   Clinical Social Work interventions: ADRs education CSW role education  Loren Racer, [place] Worker [place]  [place] Phone: [phone] Fax: [phone]

## 2014-06-13 ENCOUNTER — Ambulatory Visit: Payer: Medicaid Other

## 2014-06-16 ENCOUNTER — Ambulatory Visit: Payer: Medicaid Other

## 2014-06-16 ENCOUNTER — Encounter: Payer: Self-pay | Admitting: Radiation Oncology

## 2014-06-16 ENCOUNTER — Ambulatory Visit: Payer: Medicaid Other | Admitting: Radiation Oncology

## 2014-06-17 ENCOUNTER — Ambulatory Visit: Payer: Medicaid Other

## 2014-06-18 ENCOUNTER — Ambulatory Visit (HOSPITAL_COMMUNITY)
Admission: RE | Admit: 2014-06-18 | Discharge: 2014-06-18 | Disposition: A | Discharge status: Home / Self Care | Payer: Medicaid Other | Source: Ambulatory Visit | Attending: Radiation Oncology | Admitting: Radiation Oncology

## 2014-06-18 ENCOUNTER — Ambulatory Visit: Payer: Medicaid Other

## 2014-06-18 DIAGNOSIS — C7951 Secondary malignant neoplasm of bone: Secondary | ICD-10-CM | POA: Insufficient documentation

## 2014-06-18 DIAGNOSIS — C7952 Secondary malignant neoplasm of bone marrow: Secondary | ICD-10-CM

## 2014-06-18 MED ORDER — GADOBENATE DIMEGLUMINE 529 MG/ML IV SOLN
17.0000 mL | Freq: Once | INTRAVENOUS | Status: AC | PRN
  Administered 2014-06-18: 17 mL via INTRAVENOUS

## 2014-06-19 ENCOUNTER — Ambulatory Visit: Admission: RE | Admit: 2014-06-19 | Payer: Medicaid Other | Source: Ambulatory Visit | Admitting: Radiation Oncology

## 2014-06-19 ENCOUNTER — Ambulatory Visit: Payer: Medicaid Other

## 2014-06-20 ENCOUNTER — Ambulatory Visit: Payer: Medicaid Other

## 2014-06-25 ENCOUNTER — Encounter: Payer: Self-pay | Admitting: Radiation Oncology

## 2014-06-25 NOTE — Progress Notes (Signed)
I was able to reach the patient today after leaving a message for him on Monday. The patient's case was presented at brain/spine conference and the consensus was that there was not any meaningful benefit that would be gained by offering the patient additional radiation treatment or surgery to the spine. The patient has advanced, highly aggressive cancer and is experiencing multifocal progression despite prior treatment.  I discussed this with the patient. He is aware that palliative radiation could be considered for other areas, especially areas that have not received prior radiation, if symptoms warrant consideration.

## 2014-06-27 ENCOUNTER — Ambulatory Visit: Payer: Self-pay | Admitting: Radiation Oncology

## 2014-06-27 ENCOUNTER — Encounter: Payer: Self-pay | Admitting: *Deleted

## 2014-06-27 NOTE — Progress Notes (Signed)
Frank Ward  Clinical Social Ward was referred by patient for assessment of psychosocial needs.  Clinical Social Worker was contacted by patient at [facility] via phone. Pt had questions about his treatment plan and medications. CSW informed Florian Buff, RN that pt does not appear to have adequate understanding of his current diagnosis and is wondering why he can't have more radiation. Pt also had questions about his pain medications and CSW informed Val Dodd. She plans to contact pt and discuss further. Pt reports to continue to reside with his brother at this time. He shared concerns as he has a pending court date on 07/08/14 and thought that was why he could not complete anymore treatment at this time.   Pt declined revisiting his ADRs, as they are completed. He had expressed concerns about adding his mother, but has since changed his mind as she has poor health. Pt shared financial concerns as he shared issues with his medicaid and outstanding copays due to not attending appointments. CSW provided pt with billing and financial counselor numbers. Pt declined other concerns at this time. Pt aware of how to contact CSW as needed.   Clinical Social Ward interventions: Supportive listening and education Referral  Loren Racer, [place] Worker [place]  [place] Phone: [phone] Fax: [phone]

## 2014-06-30 ENCOUNTER — Encounter: Payer: Self-pay | Admitting: Oncology

## 2014-06-30 ENCOUNTER — Other Ambulatory Visit: Payer: Self-pay | Admitting: *Deleted

## 2014-06-30 ENCOUNTER — Telehealth: Payer: Self-pay | Admitting: *Deleted

## 2014-06-30 DIAGNOSIS — C7952 Secondary malignant neoplasm of bone marrow: Secondary | ICD-10-CM

## 2014-06-30 DIAGNOSIS — C7951 Secondary malignant neoplasm of bone: Secondary | ICD-10-CM

## 2014-06-30 DIAGNOSIS — C73 Malignant neoplasm of thyroid gland: Secondary | ICD-10-CM

## 2014-06-30 MED ORDER — HYDROMORPHONE HCL 4 MG PO TABS: 4.0000 mg | ORAL_TABLET | ORAL | Status: DC | PRN

## 2014-06-30 NOTE — Progress Notes (Signed)
Pt is approved for the $400 [place].  Pt has copy of approval letter.

## 2014-06-30 NOTE — Telephone Encounter (Signed)
Received call from [place] @ [facility] stating that pt was there to pick up pain medication refills.  There were no medications called in for pt.  This nurse reviewed pt's chart and noted that pt was instructed by Marlon Pel, RN on 06/11/14 to meet with financial counselor to apply for assistance with medication.    Spoke with Nira Conn, NP.  Pt has appt with NP on 11/18.  Per Nira Conn, pt can have Dilaudid 4mg  refill enough until pt sees her on Wed. Called pt and was informed that pt was in the lobby.   Informed pt that Lenise, financial counselor could assist pt today with medication assistance.   Pt left after seeing Lenise, and went to [facility] to pick up pain meds.  Pt had also requested pain meds through desk nurse.  Prescription for Dilaudid 4mg   # 40 with No refill signed by Dr. Jana Hakim  Was ready for pt to pick up. Was informed by Aaron Edelman @ [facility] that pt was in the building.  This nurse walked to pharmacy to give [place] prescription for pt. Pt was sitting in the lobby.  Pt was hostile stating that " why are you all playing with my prescriptions ".  Attempted to explain to pt that it was too early for MS Contin and Dexamethasone to be refilled. Pt could have Dilaudid refill for now.  Pt was hostile and talked in loud tone of voice.  Informed pt that Nira Conn, NP will see pt on Wed, and will be glad to refill medications when it is time. Pt kept talking loud and in threatening tone.

## 2014-07-01 ENCOUNTER — Other Ambulatory Visit: Payer: Self-pay | Admitting: *Deleted

## 2014-07-01 ENCOUNTER — Ambulatory Visit: Payer: Medicaid Other | Admitting: Radiation Oncology

## 2014-07-01 MED ORDER — LORAZEPAM 1 MG PO TABS: 1.0000 mg | ORAL_TABLET | Freq: Three times a day (TID) | ORAL | Status: DC | PRN

## 2014-07-01 MED ORDER — TRAZODONE HCL 100 MG PO TABS: 100.0000 mg | ORAL_TABLET | Freq: Every evening | ORAL | Status: DC | PRN

## 2014-07-01 NOTE — Telephone Encounter (Signed)
Pt came in to the office to ask about prescriptions he is requesting.  This RN had printed med list with last dates and amounts per dispense.  Per review and discussion Zaylen verbalized understanding that he only needs the trazodone and ativan.  He was given a month supply of dexamethasone and MS Contin which he states " yes I have enough of them ".  Trentan also produced a bottle of metformin 500 mg with his brother's name on the label. He states " my brother is not using this anymore because he is now on insulin so I have been using it and need a prescription "  This RN inquired with [place] reason why he has been taking it with his reply of " I am a diabetic "  This RN informed [place] per his chart there is not a diagnosis of diabetes.  He stated " when I was in the hospital I was told I had it ", my mother and dad had it and so does many people in my family" " my feet are swollen up from it too "  This RN reviewed chart and noted per hospitalization in July of this year noted elevated blood sugars. No noted lab results for an A1-C but also noted pt was put on steroids.  Last Cmet in Oct 2015 showed blood glucose of 91.  This RN obtained prescription for trazodone and ativan.  Informed pt the request for metformin will be discussed at his visit tomorrow and appropriate labs will be drawn per work up for diabetes.  Note while pt was in lobby waiting for this RN to obtain prescriptions - he became anxious and tearful- volunteer brought pt to private room for RN to complete interaction.  Derion states he feels bad " because yesterday I kind of got upset and wasn't nice in the pharmacy and to the chinese girl from your office " " I was in pain and have been given some bad news by the doctor but God doesn't like it when you get mad "  Sonny stated he would like to speak with her to give her an apology.  This RN validated Fontaine's feelings- as well as understanding current medical  concerns. This RN reiterated to him goal of this office is to assist him to feel as good as he can.  Per end of interaction- Antwan had written prescriptions as well as time for appointment.

## 2014-07-02 ENCOUNTER — Other Ambulatory Visit: Payer: Self-pay | Admitting: *Deleted

## 2014-07-02 ENCOUNTER — Telehealth: Payer: Self-pay | Admitting: Oncology

## 2014-07-02 ENCOUNTER — Ambulatory Visit (HOSPITAL_BASED_OUTPATIENT_CLINIC_OR_DEPARTMENT_OTHER): Payer: Medicaid Other | Admitting: Nurse Practitioner

## 2014-07-02 ENCOUNTER — Other Ambulatory Visit (HOSPITAL_BASED_OUTPATIENT_CLINIC_OR_DEPARTMENT_OTHER): Payer: Medicaid Other

## 2014-07-02 VITALS — BP 133/82 | HR 102 | Temp 98.7°F | Resp 20 | Wt 188.9 lb

## 2014-07-02 DIAGNOSIS — R739 Hyperglycemia, unspecified: Secondary | ICD-10-CM

## 2014-07-02 DIAGNOSIS — C7951 Secondary malignant neoplasm of bone: Secondary | ICD-10-CM

## 2014-07-02 DIAGNOSIS — T380X5A Adverse effect of glucocorticoids and synthetic analogues, initial encounter: Secondary | ICD-10-CM

## 2014-07-02 DIAGNOSIS — C7800 Secondary malignant neoplasm of unspecified lung: Secondary | ICD-10-CM

## 2014-07-02 DIAGNOSIS — IMO0002 Reserved for concepts with insufficient information to code with codable children: Secondary | ICD-10-CM

## 2014-07-02 DIAGNOSIS — C73 Malignant neoplasm of thyroid gland: Secondary | ICD-10-CM

## 2014-07-02 DIAGNOSIS — C7931 Secondary malignant neoplasm of brain: Secondary | ICD-10-CM

## 2014-07-02 DIAGNOSIS — G893 Neoplasm related pain (acute) (chronic): Secondary | ICD-10-CM

## 2014-07-02 LAB — CBC WITH DIFFERENTIAL/PLATELET
BASO%: 0 % (ref 0.0–2.0)
BASOS ABS: 0 10*3/uL (ref 0.0–0.1)
EOS ABS: 0 10*3/uL (ref 0.0–0.5)
EOS%: 0 % (ref 0.0–7.0)
HEMATOCRIT: 36.8 % — AB (ref 38.4–49.9)
HGB: 11.8 g/dL — ABNORMAL LOW (ref 13.0–17.1)
LYMPH%: 7.4 % — AB (ref 14.0–49.0)
MCH: 31.1 pg (ref 27.2–33.4)
MCHC: 32.1 g/dL (ref 32.0–36.0)
MCV: 97.1 fL (ref 79.3–98.0)
MONO#: 0.8 10*3/uL (ref 0.1–0.9)
MONO%: 9.6 % (ref 0.0–14.0)
NEUT%: 83 % — AB (ref 39.0–75.0)
NEUTROS ABS: 6.8 10*3/uL — AB (ref 1.5–6.5)
PLATELETS: 257 10*3/uL (ref 140–400)
RBC: 3.79 10*6/uL — AB (ref 4.20–5.82)
RDW: 16.1 % — ABNORMAL HIGH (ref 11.0–14.6)
WBC: 8.2 10*3/uL (ref 4.0–10.3)
lymph#: 0.6 10*3/uL — ABNORMAL LOW (ref 0.9–3.3)

## 2014-07-02 LAB — COMPREHENSIVE METABOLIC PANEL (CC13)
ALT: 14 U/L (ref 0–55)
ANION GAP: 11 meq/L (ref 3–11)
AST: 10 U/L (ref 5–34)
Albumin: 3.6 g/dL (ref 3.5–5.0)
Alkaline Phosphatase: 126 U/L (ref 40–150)
BUN: 10.5 mg/dL (ref 7.0–26.0)
CALCIUM: 8.3 mg/dL — AB (ref 8.4–10.4)
CHLORIDE: 104 meq/L (ref 98–109)
CO2: 26 mEq/L (ref 22–29)
Creatinine: 0.8 mg/dL (ref 0.7–1.3)
Glucose: 130 mg/dl (ref 70–140)
Potassium: 4 mEq/L (ref 3.5–5.1)
SODIUM: 141 meq/L (ref 136–145)
Total Bilirubin: 0.34 mg/dL (ref 0.20–1.20)
Total Protein: 6.2 g/dL — ABNORMAL LOW (ref 6.4–8.3)

## 2014-07-02 LAB — TSH CHCC: TSH: 1.648 m[IU]/L (ref 0.320–4.118)

## 2014-07-02 LAB — TECHNOLOGIST REVIEW

## 2014-07-02 MED ORDER — DOCUSATE SODIUM 100 MG PO CAPS: 100.0000 mg | ORAL_CAPSULE | Freq: Two times a day (BID) | ORAL | Status: AC | PRN

## 2014-07-02 MED ORDER — FUROSEMIDE 20 MG PO TABS: 20.0000 mg | ORAL_TABLET | Freq: Every day | ORAL | Status: DC

## 2014-07-02 NOTE — Progress Notes (Signed)
[name]  Telephone:([phone]) [phone] Fax:([phone]) [phone]     ID: Frank Ward DOB: September 28, 1970  MR#: [id number]  HKV#:[id number]  Patient Care Team: No Pcp Per Patient as PCP - General (General Practice) PCP: No PCP Per Patient GYN: SU:  OTHER MD: Enis Gash, Delight Hoh  CHIEF COMPLAINT: metastatic thyroid cancer  THYROID CANCER HISTORY: From Dr Shanon Brow Chism's 08/29/2013 summary:  "Frank Ward is a 43 y.o. male with a history of metastatic thyroid cancer diagnosed in July 2014 with findings on paraspinal biopsy. His medical oncology care has been rendered by Dr. Delight Hoh of [facility] whom I spoke to personally and reviewed his records.  Dr. Grayland Ormond states that patient was lost to followup over the past 5-6 months. He presented to Dr. Grayland Ormond for evaluation of thyroid mass, multiple lung and bone lesions suspicious for metastatic disease on Jan 02, 2013. This was a follow up appointment for an emergency room visit for acute onset backpain several weeks prior. During this time the patient was homeless. CT of Head on December 10, 2012 demonstrated no acute intracranial processes but CT Chest, Abd and Pelvis on the same date showed innumerable bilateral pulmonary nodules of varying sizes with an associated 3 x 3.5 cm soft tissue mass with associated bone destruction of the right ilium and a heterogeneous enlargement of the right thyroid gland with right inferior extension and leftward deviation of the trachea concerning malignancy. He had a PET scan (01/03/13) showed widespread foci of abnormal uptake consistent with metastatic disease and a dominant mass in the right thyroid lobe suspicious for thyroid malignancy with multiple bony metastases and a large T9 destructive lesion likely invading the spinal canal with significant spinal lesions at C3 and C4 and T1. The lesions are T12 and L1 were lytic and hypermetabolic. There were  also lesions in the right sacral and adjacent iliac bone and more anterior in the right iliac bone. 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 He had a total thyroidectomy on 01/29/2013 by Dr. Roena Malady.  CT scan of the head (02/05/2013) showed 2 lesions--an abnormal hyperdense focus in the posterior aspect of there right lateral ventricle measuring 10 x 12 mm and a 5 mm diameter hyperdense nodule in the fourth ventricle-- in the ventricle occupying but patient was asymptomatic. He underwent radiation to his T-9 spine based on significant pain and marked bony destruction with possible cord impingement. He had a total of 300 cGY/Fx in 10 fractions from 01/30/2013 to 02/12/2013 by Dr. Noreene Filbert. [facility]. He was on fentanyl patch 100 mcg q 3 days, hydormorphone 4 mg q 4 hours prn, dexamethasone 4 mg bid and gabapentin 600 mg tid. His fentanyl patch was increased to 150 mcg q 3 days.  He followed up with his medical oncologists in July 20 with his TSH greater than 35, ready to proceed with radioactive iodide ablation. Because of his social situation, he was required to be admitted to the hospital for treatment. His plan for treatment was radioactive iodide and if that did not work to consider sutent or other oral chemotherapies. He was scheduled for neurosurgery evaluation on July 29th, 2014. Treatment for his pain was managed by Dr. Izora Gala Phifer of Palliative care. He underwent radioactive iodide ablation on 05/02/2013 receiving a dose of 1.76 mCi of iodine  131. However given the bulky widespread metastatic disease which was I-131 avid and the large metastatic tumor size in multiple foci the likelihood of these being completely treated with I-131 was felt to be low and close follow-up was recommended. He was  subsequently lost to follow-up.  Notable labs in May, 2014 included CA 19-9 of 43 (0-35 U/mL) and CEA of 1.4 (0.0 - 4.7 ng/mL), PSA of 1.7, AFP of 3.7, serum calcitonin less than 2.0 pg/mL, THY antithyroglobulin AB less than 1.0 IU/mL and a negative beta-HCG tumor marker. TSH on 03/01/2013 was 42.3 and repeated on 03/25/2013 and found to be 69.8.  He reports that he moved here from [place] about 2 months ago. He now lives with his mother. He presented to [facility] ED in July for right neck and shoulder pain. A CT of his neck and chest showed pathological fractures of c3 and C4 vertebrae with metastases to the cervical spine. He was evaluated by neurosurgery and recommended for an MRI of the spine but patient signed out AMA from the ED. He presented to [facility] ED on 1/14 due to right neck pain radiating down the shoulder and dizziness. MRI of the brain and C-spine showed multiple metastases to the brain, progressive pathological compression fracture of C3 and C4 with extensive extraosseous tumor spread. "  His subsequent history is as detailed below  INTERVAL HISTORY: Frank Ward returns today for follow up of his metastatic papillary thyroid cancer. He completed palliative radiation treatments to her pelvis with Dr. Valere Dross and this helped improve his gait and level of pain. Today he rates his pain at 6/10 in areas including his right hip, lower back, and right shoulder. He uses a cane to ambulate. This pain can be sharp, throbbing, or cramping. He picked up his prescriptions for MS Contin (60mg  q12) and dilaudid 4mg  (q4h PRN) yesterday. He is having some constipation with these medications and typically takes colace, but has run out of these capsules.  REVIEW OF SYSTEMS: Frank Ward denies fevers, chills, nausea, or vomiting. He has intermittent headaches and experiences weakness frequently. He stopped taking the seroquel he was prescribed, and denies being schizophrenic. He exclaims he "is not crazy."  He  was recently started on 164mcg of synthroid daily but admits he may miss a dose or two in a week. He has no shortness of breath, chest pain, cough, palpitations, or fatigue. A detailed review of systems is otherwise noncontributory.    PAST MEDICAL HISTORY: Past Medical History  Diagnosis Date  . Cancer     Thyroid  . Thyroid disease   . Hypertension   . Pneumonia   . Hx of radiation therapy 02/07/14- 02/21/14    T12-L2, sacrum 3000 cGy 10 sessions  . Hx of radiation therapy 09/04/13-09/24/13    whole brain, C7-T3, upper C spine, right scapulae  . Hx of radiation therapy 06/10/14    left ischium 1 fx 800 cGy    PAST SURGICAL HISTORY: Past Surgical History  Procedure Laterality Date  . Tendon repair    . Thyroidectomy      FAMILY HISTORY Family History  Problem Relation Age of Onset  . Diabetes Mother   . Diabetes Brother     SOCIAL HISTORY:  Patient is single, no children. He lives in [place] with his mother and sometimes his brother. He has also lived in [place] and [place].Marland Kitchen He quit smoking in January of 2014, until then he smoked 1 ppd for 10 years.He reports that he drinks  about 8 beers a week. 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 Smoking status: Former Smoker -- 10 years    Types: Cigarettes    Quit date: 08/29/2011  . Smokeless tobacco: Never Used  . Alcohol Use: 3.0 oz/week    5 Cans of beer per week     Comment: occ     Colonoscopy:  PSA:  Bone density:  Lipid panel:  Allergies  Allergen Reactions  . Penicillins Anaphylaxis, Hives and Itching  . Latex Hives and Itching  . Lyrica [Pregabalin] Swelling    Current Outpatient Prescriptions  Medication Sig Dispense Refill  . dexamethasone (DECADRON) 4 MG tablet Take 1 tablet (4 mg total)  by mouth every morning. 30 tablet 0  . furosemide (LASIX) 20 MG tablet Take 1 tablet (20 mg total) by mouth daily. 30 tablet 2  . HYDROmorphone (DILAUDID) 4 MG tablet Take 1 tablet (4 mg total) by mouth every 4 (four) hours as needed. 40 tablet 0  . ibuprofen (ADVIL,MOTRIN) 200 MG tablet Take 200-400 mg by mouth every 6 (six) hours as needed for mild pain.    Marland Kitchen levothyroxine (SYNTHROID, LEVOTHROID) 125 MCG tablet Take 1 tablet (125 mcg total) by mouth daily before breakfast. 30 tablet 6  . LORazepam (ATIVAN) 1 MG tablet Take 1 tablet (1 mg total) by mouth every 8 (eight) hours as needed for anxiety. 30 tablet 1  . morphine (MS CONTIN) 60 MG 12 hr tablet Take 1 tablet (60 mg total) by mouth every 12 (twelve) hours. 60 tablet 0  . pantoprazole (PROTONIX) 40 MG tablet Take 1 tablet (40 mg total) by mouth daily. 30 tablet 0  . traZODone (DESYREL) 100 MG tablet Take 1 tablet (100 mg total) by mouth at bedtime as needed for sleep. 30 tablet 0  . docusate sodium (COLACE) 100 MG capsule Take 1 capsule (100 mg total) by mouth 2 (two) times daily as needed for mild constipation. 60 capsule 2  . QUEtiapine (SEROQUEL XR) 50 MG TB24 24 hr tablet Take 2 tablets (100 mg total) by mouth daily. 30 each 0   No current facility-administered medications for this visit.    OBJECTIVE: Middle-aged [place] American man who appears well groomed today Filed Vitals:   07/02/14 1117  BP: 133/82  Pulse: 102  Temp: 98.7 F (37.1 C)  Resp: 20     Body mass index is 23.01 kg/(m^2).    ECOG FS:1 - Symptomatic but completely ambulatory  Skin: warm, dry  HEENT: sclerae anicteric, conjunctivae pink, oropharynx clear. No thrush or mucositis. . Lymph Nodes: No cervical or supraclavicular lymphadenopathy  Lungs: clear to auscultation bilaterally, no rales, wheezes, or rhonci  Heart: regular rate and rhythm  Abdomen: round, soft, non tender, positive bowel sounds  Musculoskeletal: Pain along right side and back that radiates  to thigh, no peripheral edema, gait disturbed, cane in use. 2+ pitting edema to bilateral lower extremities in compression socks. Neuro: non focal, well oriented, anxious affect   LAB RESULTS:  CMP     Component Value Date/Time   NA 141 07/02/2014 1051   NA 140 02/16/2014 0425   K 4.0 07/02/2014 1051   K 3.9 02/16/2014 0425   CL 103 02/16/2014 0425   CO2 26 07/02/2014 1051   CO2 24 02/16/2014 0425  GLUCOSE 130 07/02/2014 1051   GLUCOSE 110* 02/16/2014 0425   BUN 10.5 07/02/2014 1051   BUN 16 02/16/2014 0425   CREATININE 0.8 07/02/2014 1051   CREATININE 0.84 02/21/2014 0403   CALCIUM 8.3* 07/02/2014 1051   CALCIUM 8.5 02/16/2014 0425   PROT 6.2* 07/02/2014 1051   PROT 5.2* 02/12/2014 0405   ALBUMIN 3.6 07/02/2014 1051   ALBUMIN 2.8* 02/12/2014 0405   AST 10 07/02/2014 1051   AST 13 02/12/2014 0405   ALT 14 07/02/2014 1051   ALT 15 02/12/2014 0405   ALKPHOS 126 07/02/2014 1051   ALKPHOS 89 02/12/2014 0405   BILITOT 0.34 07/02/2014 1051   BILITOT <0.2* 02/12/2014 0405   GFRNONAA >90 02/21/2014 0403   GFRAA >90 02/21/2014 0403    I No results found for: SPEP  Lab Results  Component Value Date   WBC 8.2 07/02/2014   NEUTROABS 6.8* 07/02/2014   HGB 11.8* 07/02/2014   HCT 36.8* 07/02/2014   MCV 97.1 07/02/2014   PLT 257 07/02/2014      Chemistry      Component Value Date/Time   NA 141 07/02/2014 1051   NA 140 02/16/2014 0425   K 4.0 07/02/2014 1051   K 3.9 02/16/2014 0425   CL 103 02/16/2014 0425   CO2 26 07/02/2014 1051   CO2 24 02/16/2014 0425   BUN 10.5 07/02/2014 1051   BUN 16 02/16/2014 0425   CREATININE 0.8 07/02/2014 1051   CREATININE 0.84 02/21/2014 0403      Component Value Date/Time   CALCIUM 8.3* 07/02/2014 1051   CALCIUM 8.5 02/16/2014 0425   ALKPHOS 126 07/02/2014 1051   ALKPHOS 89 02/12/2014 0405   AST 10 07/02/2014 1051   AST 13 02/12/2014 0405   ALT 14 07/02/2014 1051   ALT 15 02/12/2014 0405   BILITOT 0.34 07/02/2014 1051    BILITOT <0.2* 02/12/2014 0405       No results found for: LABCA2  No components found for: LABCA125  No results for input(s): INR in the last 168 hours.  Urinalysis    Component Value Date/Time   COLORURINE YELLOW 02/07/2014 0355   APPEARANCEUR CLOUDY* 02/07/2014 0355   LABSPEC 1.011 02/07/2014 0355   PHURINE 5.5 02/07/2014 [place] 02/07/2014 0355   HGBUR NEGATIVE 02/07/2014 0355   BILIRUBINUR NEGATIVE 02/07/2014 0355   KETONESUR 15* 02/07/2014 0355   PROTEINUR NEGATIVE 02/07/2014 0355   UROBILINOGEN 1.0 02/07/2014 0355   NITRITE NEGATIVE 02/07/2014 0355   LEUKOCYTESUR NEGATIVE 02/07/2014 0355    STUDIES: Mr Total Spine Mets Screening  06/19/2014   CLINICAL DATA:  Metastatic thyroid cancer. Undergoing radiation therapy. RIGHT hip and leg pain.  EXAM: MRI TOTAL SPINE WITHOUT AND WITH CONTRAST  TECHNIQUE: Multisequence abbreviated MR imaging of the spine from the cervical spine to the sacrum was performed prior to and following IV contrast administration for evaluation of gross sequelae of spinal metastatic disease.  CONTRAST:  MultiHance 17 mL.  COMPARISON:  Prior cervical MRI 12/26/2013. Prior thoracic spine MRI 12/30/2013. Prior lumbar spine MRI 12/30/2013.  FINDINGS: Cervical Findings:  Progressive collapse of C3 and C4 with retropulsion. Abnormal T2 and STIR cord signal opposite these levels. Retropulsion of epidural tumor and bone into the spinal canal remains greater on the RIGHT, at the C3 level, with increasing cord compression. Canal diameter now 7 mm as compared with 9 mm previously.  Thoracic Findings:  Metastatic disease at T1 is redemonstrated, primarily involving the inferior aspect of that vertebral  body. There has been progressive pathologic collapse of T9 related to metastatic disease. No epidural tumor is present at the T9 level. Developing metastatic disease is observed now at T6 with tumor along the inferior margin of that vertebral body. Posterior  element metastasis and vertebral body metastasis at T12, with progressive pathologic fracture and developing cord compression from both anterior and posterior tumor encroachment on the spinal canal. This is maximal at the inferior T12 level. Developing edema of the distal thoracic cord and conus. Post XRT changes throughout the spine are stable. Extensive pulmonary metastatic disease.  Lumbar Findings:  Progressive metastatic disease throughout the sacrum, involving the body and both ala, RIGHT greater than LEFT. Encroachment of epidural tumor and retropulsed bone into the sacral canal, with impending cauda equina compression greater on the LEFT(precise delineation degraded by patient motion).  Progressive metastatic disease with collapse at L1 also with posterior element involvement. Moderate epidural tumor due to bulky disease into the RIGHT paraspinous soft tissues displaces the thecal sac from RIGHT to LEFT. Post XRT changes. Developing metastatic disease without collapse at L3. Stable metastatic disease subcentimeter in size at L5. New tiny metastasis at L4. Slight flattening of the L2 vertebral body with mild endplate edema could represent an insufficiency lesion, as there is no dominant mass.  IMPRESSION: Worsening metastatic disease throughout all spinal segments as described above. Potentially significant areas of cord or cauda equina compression are observed at C3, T12, and the first sacral segment. See discussion above.  New lesions most notable at T6 and L3.  I discussed these findings with Dr. [name] at time of interpretation.   Electronically Signed   By: Rolla Flatten M.D.   On: 06/19/2014 08:35    ASSESSMENT: 43 y.o. with metastatic (Right) thyroid cancer diagnosed by paraspinal biopsy 01/11/2013 showing papillary thyroid carcinoma, columnar cell variant  (1) presented with back pain April 2014, staging studies showed lung and bone metastases  (2) s/p total thyroidectomy 01/29/2013 (Dr Beverly Gust)  (3) s/p I-131 , 252.3 mCu, 04/02/2013 (or 1.76 mCu 05/02/2013?)  A. Brain metastases noted on head CT 02/05/2013  (1) s/p palliative external beam radiation therapy to the whole brain/upper mid cervical spine, 3000 cGy in 10 sessions (09/04/2013 through 09/24/2013)  B. Spine lesions (has received radiation to C7-T3, T9, Right scapula, and T12-L2)  (1) s/p radiation to T9 (Dr Donella Stade) completed 02/12/2013  (2) Status post abbreviated palliative radiotherapy to the lower cervical/upper thoracic spine (C7-T3) receiving 2100 cGy in 7 sessions  (3) Status post  abbreviated course of palliative radiation therapy to the right scapula with the same fractionation. He did not receive his final 3 fractions of planned radiation therapy to his right scapula or lower cervical/upper thoracic spine (C7-T3). (Dr. [name])  (4) More recently, he completed treatment to his lower thoracic/upper lumbar spine (T12-L2) and sacrum, receiving 3000 cGy in 10 sessions completed  his therapy on 02/21/2014. (Dr. Valere Dross)  C. 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 Statzer is doing moderately well today. His pain is under control and he has no new complaints to offer. The labs were reviewed in detail and were relatively stable. His thyroglobulin level is trending downwards, his TSH is normal. His pain meds were refilled yesterday, and today I have refilled his lasix and colace. I have also written him a paper Rx for a new c-spine collar in hopes that [facility] could help file with his insurance.  His old collar is unable to be used.   Mr. Weckerly expresses interest in more radiation treatments, but he has been told by rad onc that there was not much else that they could do, given the worsening impression of the MRI of the spine. We will continue to manage Mr. Aune's pain with the current meds. He will return to this office in 6 weeks for a meeting  with Dr. Jana Hakim. He understands and agrees with this plan. He has been encouraged to call with any issues that might arise before his next visit here.   Marcelino Duster, NP   07/02/2014 1:21 PM

## 2014-07-02 NOTE — Telephone Encounter (Signed)
lvm fo rpt regarding to Jan 2016 appt....mailed pt appt sched and avs for pt for Jan

## 2014-07-03 LAB — HEMOGLOBIN A1C
Hgb A1c MFr Bld: 5.6 % (ref <5.7)
MEAN PLASMA GLUCOSE: 114 mg/dL (ref <117)

## 2014-07-03 LAB — THYROGLOBULIN LEVEL: Thyroglobulin: 27600 ng/mL — ABNORMAL HIGH (ref 2.8–40.9)

## 2014-07-04 ENCOUNTER — Encounter: Payer: Self-pay | Admitting: *Deleted

## 2014-07-04 ENCOUNTER — Encounter: Payer: Self-pay | Admitting: Nurse Practitioner

## 2014-07-04 LAB — THYROGLOBULIN ANTIBODY: Thyroglobulin Ab: <1 IU/mL (ref <2)

## 2014-07-04 NOTE — Progress Notes (Signed)
Frank Ward  Clinical Social Ward was referred by patient for assessment of psychosocial needs.  Clinical Social Worker continues to follow patient and has meet with him several times this week to answer resource questions, provide support and to reassess needs. CSW assisted with letter for court appearance and pt aware CSW has letter ready for him when he returns. Pt very appreciative of support and assistance. He has many stressors that can result in agitation and CSW has not witnessed this to date. Pt sees CSW team as a source of support and is well aware of how to reach out to [place] team as needed. CSW will continue to follow and assist.     Clinical Social Ward interventions: Emotional support Resource education Pt advocacy  Frank Ward, [place] Worker [place]  [place] Phone: [phone] Fax: [phone]

## 2014-07-07 ENCOUNTER — Other Ambulatory Visit: Payer: Self-pay | Admitting: *Deleted

## 2014-07-07 ENCOUNTER — Encounter: Payer: Self-pay | Admitting: Nutrition

## 2014-07-07 MED ORDER — DEXAMETHASONE 4 MG PO TABS: 4.0000 mg | ORAL_TABLET | Freq: Every morning | ORAL | Status: DC

## 2014-07-07 NOTE — Progress Notes (Signed)
Patient requesting assistance with Ensure Plus samples.  He has metastatic thyroid cancer. Provided one case of complimentary Ensure Plus.

## 2014-07-16 ENCOUNTER — Other Ambulatory Visit: Payer: Self-pay | Admitting: *Deleted

## 2014-07-16 DIAGNOSIS — C73 Malignant neoplasm of thyroid gland: Secondary | ICD-10-CM

## 2014-07-16 DIAGNOSIS — C7952 Secondary malignant neoplasm of bone marrow: Secondary | ICD-10-CM

## 2014-07-16 DIAGNOSIS — C7951 Secondary malignant neoplasm of bone: Secondary | ICD-10-CM

## 2014-07-16 MED ORDER — HYDROMORPHONE HCL 4 MG PO TABS: 4.0000 mg | ORAL_TABLET | ORAL | Status: DC | PRN

## 2014-07-16 MED ORDER — MORPHINE SULFATE ER 60 MG PO TBCR: 60.0000 mg | EXTENDED_RELEASE_TABLET | Freq: Two times a day (BID) | ORAL | Status: DC

## 2014-07-24 ENCOUNTER — Other Ambulatory Visit: Payer: Self-pay | Admitting: *Deleted

## 2014-07-24 DIAGNOSIS — C7951 Secondary malignant neoplasm of bone: Secondary | ICD-10-CM

## 2014-07-24 DIAGNOSIS — C7952 Secondary malignant neoplasm of bone marrow: Secondary | ICD-10-CM

## 2014-07-24 DIAGNOSIS — C73 Malignant neoplasm of thyroid gland: Secondary | ICD-10-CM

## 2014-07-24 MED ORDER — DEXAMETHASONE 4 MG PO TABS: 4.0000 mg | ORAL_TABLET | Freq: Every morning | ORAL | Status: DC

## 2014-07-24 MED ORDER — HYDROMORPHONE HCL 4 MG PO TABS: 4.0000 mg | ORAL_TABLET | ORAL | Status: DC | PRN

## 2014-07-24 MED ORDER — FUROSEMIDE 20 MG PO TABS: 20.0000 mg | ORAL_TABLET | Freq: Every day | ORAL | Status: DC

## 2014-07-24 MED ORDER — TRAZODONE HCL 100 MG PO TABS: 100.0000 mg | ORAL_TABLET | Freq: Every evening | ORAL | Status: DC | PRN

## 2014-07-24 MED ORDER — LORAZEPAM 1 MG PO TABS
1.0000 mg | ORAL_TABLET | Freq: Three times a day (TID) | ORAL | Status: DC | PRN
Start: 2014-07-24 — End: 2014-09-09

## 2014-08-01 ENCOUNTER — Encounter: Payer: Self-pay | Admitting: Nutrition

## 2014-08-01 NOTE — Progress Notes (Signed)
I provided second complimentary case of Ensure Plus. 

## 2014-08-18 ENCOUNTER — Other Ambulatory Visit: Payer: Self-pay | Admitting: *Deleted

## 2014-08-18 ENCOUNTER — Telehealth: Payer: Self-pay | Admitting: *Deleted

## 2014-08-18 DIAGNOSIS — C73 Malignant neoplasm of thyroid gland: Secondary | ICD-10-CM

## 2014-08-18 DIAGNOSIS — C7952 Secondary malignant neoplasm of bone marrow: Secondary | ICD-10-CM

## 2014-08-18 DIAGNOSIS — C7951 Secondary malignant neoplasm of bone: Secondary | ICD-10-CM

## 2014-08-18 MED ORDER — MORPHINE SULFATE ER 60 MG PO TBCR: 60.0000 mg | EXTENDED_RELEASE_TABLET | Freq: Two times a day (BID) | ORAL | Status: DC

## 2014-08-18 MED ORDER — HYDROMORPHONE HCL 4 MG PO TABS: 4.0000 mg | ORAL_TABLET | ORAL | Status: DC | PRN

## 2014-08-18 MED ORDER — TRAZODONE HCL 100 MG PO TABS: 100.0000 mg | ORAL_TABLET | Freq: Every evening | ORAL | Status: DC | PRN

## 2014-08-18 NOTE — Telephone Encounter (Signed)
Patient called reporting he has been out of medications four days and his back, arms, kidneys hurt.  Requested MS contin, Dilaudid, Trazedonem lorazepam and lasix.  Informed him to call pharmacy to get the lasix, and lorazepam.  Called collaborative nurse for other medicines.

## 2014-08-27 ENCOUNTER — Ambulatory Visit: Payer: Medicaid Other | Admitting: Oncology

## 2014-08-27 ENCOUNTER — Telehealth: Payer: Self-pay | Admitting: Oncology

## 2014-08-27 ENCOUNTER — Other Ambulatory Visit: Payer: Self-pay

## 2014-08-27 NOTE — Telephone Encounter (Signed)
pt missed appt & wanted to r/s-per GM to r/s for 09/19/14-gave pt copy of sch

## 2014-08-30 NOTE — Progress Notes (Signed)
No show

## 2014-09-09 ENCOUNTER — Other Ambulatory Visit: Payer: Self-pay | Admitting: *Deleted

## 2014-09-09 ENCOUNTER — Telehealth: Payer: Self-pay | Admitting: *Deleted

## 2014-09-09 DIAGNOSIS — C7951 Secondary malignant neoplasm of bone: Secondary | ICD-10-CM

## 2014-09-09 DIAGNOSIS — C73 Malignant neoplasm of thyroid gland: Secondary | ICD-10-CM

## 2014-09-09 DIAGNOSIS — C7952 Secondary malignant neoplasm of bone marrow: Secondary | ICD-10-CM

## 2014-09-09 MED ORDER — HYDROMORPHONE HCL 4 MG PO TABS: 4.0000 mg | ORAL_TABLET | ORAL | Status: DC | PRN

## 2014-09-09 MED ORDER — LEVOTHYROXINE SODIUM 125 MCG PO TABS: 125.0000 ug | ORAL_TABLET | Freq: Every day | ORAL | Status: DC

## 2014-09-09 MED ORDER — FUROSEMIDE 20 MG PO TABS: 20.0000 mg | ORAL_TABLET | Freq: Every day | ORAL | Status: DC

## 2014-09-09 MED ORDER — MORPHINE SULFATE ER 60 MG PO TBCR: 60.0000 mg | EXTENDED_RELEASE_TABLET | Freq: Two times a day (BID) | ORAL | Status: DC

## 2014-09-09 MED ORDER — LORAZEPAM 1 MG PO TABS: 1.0000 mg | ORAL_TABLET | Freq: Three times a day (TID) | ORAL | Status: DC | PRN

## 2014-09-09 MED ORDER — DEXAMETHASONE 4 MG PO TABS: 4.0000 mg | ORAL_TABLET | Freq: Every morning | ORAL | Status: DC

## 2014-09-09 MED ORDER — TRAZODONE HCL 100 MG PO TABS: 100.0000 mg | ORAL_TABLET | Freq: Every evening | ORAL | Status: DC | PRN

## 2014-09-09 NOTE — Telephone Encounter (Signed)
Patient called requesting refills on the following medications: Hydromorphone, MS Contin, lorazepam, trazodone, furosemide, dexamethasone, synthroid.  Will notify Heather NP.  Return call number is [phone].

## 2014-09-19 ENCOUNTER — Other Ambulatory Visit: Payer: Self-pay

## 2014-09-19 ENCOUNTER — Ambulatory Visit (HOSPITAL_BASED_OUTPATIENT_CLINIC_OR_DEPARTMENT_OTHER): Payer: Medicaid Other | Admitting: Oncology

## 2014-09-19 DIAGNOSIS — C73 Malignant neoplasm of thyroid gland: Secondary | ICD-10-CM

## 2014-09-19 DIAGNOSIS — C7931 Secondary malignant neoplasm of brain: Secondary | ICD-10-CM

## 2014-09-20 NOTE — Progress Notes (Signed)
No show

## 2014-10-02 ENCOUNTER — Telehealth: Payer: Self-pay

## 2014-10-02 NOTE — Telephone Encounter (Signed)
Called pt back again. Pt has been having difficulty swallowing over the last 1 1/2 month. Will choke on food or water. He has epigastric pain and hardness on palpation. He has thrown up a few times with release of a lot of gas also. This gives him relief. Is taking laxative for BM. Had BM today that was a full stool. He is scheduled to see his PCP tomorrow. I told him his PCP can address this issue. Pt also stated he needs his meds refilled. Specifically trazodone and dilaudid. Told him to ask his PCP if he will fill these, and if not to call us back. He has an appt at [facility] gastroenterology on 10/22/14. POF sent to reschedule missed appt of 09/19/14.

## 2014-10-02 NOTE — Telephone Encounter (Signed)
RETRIEVED PT.'S VOICE MAIL. PT. STATES HE IS HAVING A PROBLEM SWALLOWING HIS FOOD AND IS STARTING TO CHOKE NOW. TRIED TO RETURN PT.'S CALL BUT HAD TO LEAVE A MESSAGE.

## 2014-10-02 NOTE — Telephone Encounter (Signed)
Pt called stating he had medicade insurance issues that have been managed. He wants to make an appt with Dr Jana Hakim.

## 2014-10-03 ENCOUNTER — Telehealth: Payer: Self-pay | Admitting: Oncology

## 2014-10-03 NOTE — Telephone Encounter (Signed)
, °

## 2014-10-08 ENCOUNTER — Telehealth: Payer: Self-pay | Admitting: *Deleted

## 2014-10-08 DIAGNOSIS — C7951 Secondary malignant neoplasm of bone: Secondary | ICD-10-CM

## 2014-10-08 DIAGNOSIS — C7952 Secondary malignant neoplasm of bone marrow: Secondary | ICD-10-CM

## 2014-10-08 DIAGNOSIS — C73 Malignant neoplasm of thyroid gland: Secondary | ICD-10-CM

## 2014-10-08 MED ORDER — HYDROMORPHONE HCL 4 MG PO TABS: 4.0000 mg | ORAL_TABLET | ORAL | Status: DC | PRN

## 2014-10-08 MED ORDER — TRAZODONE HCL 100 MG PO TABS: 100.0000 mg | ORAL_TABLET | Freq: Every evening | ORAL | Status: DC | PRN

## 2014-10-08 MED ORDER — MORPHINE SULFATE ER 60 MG PO TBCR: 60.0000 mg | EXTENDED_RELEASE_TABLET | Freq: Two times a day (BID) | ORAL | Status: DC

## 2014-10-08 MED ORDER — FUROSEMIDE 20 MG PO TABS: 20.0000 mg | ORAL_TABLET | Freq: Every day | ORAL | Status: DC

## 2014-10-08 NOTE — Telephone Encounter (Signed)
Pt called to this RN to state need to pick up medications.  Medication list reviewed and this RN obtained names of needed refills.

## 2014-10-08 NOTE — Telephone Encounter (Signed)
Patient called requesting refills for hydrocodone, MS Contin, Synthroid, Desyrel, dexamethasone, and Lasix.

## 2014-10-10 NOTE — Telephone Encounter (Signed)
Frank Ward called to say he would not be able to pick up his requested prescriptions until Monday or Tuesday (10/13/14 or 10/14/14).

## 2014-10-14 ENCOUNTER — Telehealth: Payer: Self-pay | Admitting: *Deleted

## 2014-10-14 NOTE — Telephone Encounter (Signed)
This RN spoke with Aaron Edelman pharmacist at the [facility]-  Pt is there today to fill prescription written on 10/08/2014.  Per his discussion with pt- pt may need to have adjustments due to "he says he uses up his diluadid then he has to increase taking the ms contin which then results in him needing a refill on it early "  Aaron Edelman spoke with pt and per this nurse offered for pt to come over prior to filling medications to discuss pain and possible changes in dosing.  Pt unable to come in to the office due to transportation and person assisting him unable to stay longer.  Prescriptions will be filled for today and this RN will call pt to discuss pain situation for appropriate changes.  Of note Aaron Edelman states he was only able to dispense 44 tablets of the MS Contin ( all they had available ).  This RN called pt later in day to discuss above. Cell number not working at present.  Message left on home number requesting pt to return call to this RN.

## 2014-10-28 NOTE — Progress Notes (Signed)
On 10/23/14 Mr. Cotta reportsedthat he is having increasing pain in his right groin with radiation down his right leg. He denies any swelling, but the area feels "puffy when touched. He also reports pain in his right hip. No new scans since 06/18/14  Past Treatment:  TREATMENT DATE: 06/10/2014    SITE/DOSE: Left ischium, 800 cGy in a single fraction    BEAMS/ENERGY: Oblique fields with 10 MV photons

## 2014-10-29 ENCOUNTER — Ambulatory Visit
Admission: RE | Admit: 2014-10-29 | Discharge: 2014-10-29 | Disposition: A | Discharge status: Home / Self Care | Payer: Medicaid Other | Source: Ambulatory Visit | Attending: Radiation Oncology | Admitting: Radiation Oncology

## 2014-10-29 ENCOUNTER — Ambulatory Visit: Admission: RE | Admit: 2014-10-29 | Payer: Medicaid Other | Source: Ambulatory Visit

## 2014-10-29 DIAGNOSIS — M4856XA Collapsed vertebra, not elsewhere classified, lumbar region, initial encounter for fracture: Secondary | ICD-10-CM | POA: Insufficient documentation

## 2014-10-29 DIAGNOSIS — Z510 Encounter for antineoplastic radiation therapy: Secondary | ICD-10-CM | POA: Insufficient documentation

## 2014-10-29 DIAGNOSIS — Z923 Personal history of irradiation: Secondary | ICD-10-CM | POA: Insufficient documentation

## 2014-10-29 DIAGNOSIS — Z85850 Personal history of malignant neoplasm of thyroid: Secondary | ICD-10-CM | POA: Insufficient documentation

## 2014-10-29 DIAGNOSIS — C7800 Secondary malignant neoplasm of unspecified lung: Secondary | ICD-10-CM | POA: Insufficient documentation

## 2014-10-29 DIAGNOSIS — M25551 Pain in right hip: Secondary | ICD-10-CM | POA: Insufficient documentation

## 2014-10-29 DIAGNOSIS — Z79891 Long term (current) use of opiate analgesic: Secondary | ICD-10-CM | POA: Insufficient documentation

## 2014-10-29 DIAGNOSIS — N63 Unspecified lump in breast: Secondary | ICD-10-CM | POA: Insufficient documentation

## 2014-10-29 DIAGNOSIS — C7951 Secondary malignant neoplasm of bone: Secondary | ICD-10-CM | POA: Insufficient documentation

## 2014-11-05 ENCOUNTER — Other Ambulatory Visit: Payer: Self-pay

## 2014-11-05 ENCOUNTER — Ambulatory Visit (HOSPITAL_BASED_OUTPATIENT_CLINIC_OR_DEPARTMENT_OTHER): Payer: Medicaid Other | Admitting: Oncology

## 2014-11-05 DIAGNOSIS — C7931 Secondary malignant neoplasm of brain: Secondary | ICD-10-CM

## 2014-11-05 NOTE — Progress Notes (Signed)
No show

## 2014-11-10 ENCOUNTER — Encounter: Payer: Self-pay | Admitting: Nurse Practitioner

## 2014-11-10 ENCOUNTER — Other Ambulatory Visit: Payer: Self-pay | Admitting: *Deleted

## 2014-11-10 DIAGNOSIS — C73 Malignant neoplasm of thyroid gland: Secondary | ICD-10-CM

## 2014-11-10 DIAGNOSIS — C7952 Secondary malignant neoplasm of bone marrow: Secondary | ICD-10-CM

## 2014-11-10 DIAGNOSIS — C7951 Secondary malignant neoplasm of bone: Secondary | ICD-10-CM

## 2014-11-10 MED ORDER — LORAZEPAM 1 MG PO TABS: 1.0000 mg | ORAL_TABLET | Freq: Three times a day (TID) | ORAL | Status: DC | PRN

## 2014-11-10 MED ORDER — DEXAMETHASONE 4 MG PO TABS: 4.0000 mg | ORAL_TABLET | Freq: Every morning | ORAL | Status: DC

## 2014-11-10 MED ORDER — HYDROMORPHONE HCL 4 MG PO TABS: 4.0000 mg | ORAL_TABLET | ORAL | Status: DC | PRN

## 2014-11-10 MED ORDER — FUROSEMIDE 20 MG PO TABS: 20.0000 mg | ORAL_TABLET | Freq: Every day | ORAL | Status: DC

## 2014-11-10 MED ORDER — MORPHINE SULFATE ER 60 MG PO TBCR: 60.0000 mg | EXTENDED_RELEASE_TABLET | Freq: Two times a day (BID) | ORAL | Status: DC

## 2014-11-10 MED ORDER — LEVOTHYROXINE SODIUM 125 MCG PO TABS
125.0000 ug | ORAL_TABLET | Freq: Every day | ORAL | Status: DC
Start: 2014-11-10 — End: 2014-12-19

## 2014-11-13 ENCOUNTER — Ambulatory Visit: Payer: Self-pay | Admitting: Oncology

## 2014-11-25 ENCOUNTER — Ambulatory Visit: Admission: RE | Admit: 2014-11-25 | Payer: Medicaid Other | Source: Ambulatory Visit | Admitting: Radiation Oncology

## 2014-11-25 ENCOUNTER — Ambulatory Visit: Payer: Medicaid Other

## 2014-11-26 ENCOUNTER — Ambulatory Visit
Admission: RE | Admit: 2014-11-26 | Discharge: 2014-11-26 | Disposition: A | Discharge status: Home / Self Care | Payer: Medicaid Other | Source: Ambulatory Visit | Attending: Radiation Oncology | Admitting: Radiation Oncology

## 2014-11-26 ENCOUNTER — Encounter: Payer: Self-pay | Admitting: Radiation Oncology

## 2014-11-26 VITALS — BP 110/63 | HR 112 | Temp 98.2°F | Ht 75.0 in | Wt 156.4 lb

## 2014-11-26 DIAGNOSIS — M4856XA Collapsed vertebra, not elsewhere classified, lumbar region, initial encounter for fracture: Secondary | ICD-10-CM | POA: Diagnosis not present

## 2014-11-26 DIAGNOSIS — Z923 Personal history of irradiation: Secondary | ICD-10-CM | POA: Diagnosis not present

## 2014-11-26 DIAGNOSIS — C7951 Secondary malignant neoplasm of bone: Secondary | ICD-10-CM | POA: Diagnosis not present

## 2014-11-26 DIAGNOSIS — Z510 Encounter for antineoplastic radiation therapy: Secondary | ICD-10-CM | POA: Diagnosis not present

## 2014-11-26 DIAGNOSIS — N63 Unspecified lump in breast: Secondary | ICD-10-CM | POA: Diagnosis not present

## 2014-11-26 DIAGNOSIS — Z85850 Personal history of malignant neoplasm of thyroid: Secondary | ICD-10-CM | POA: Diagnosis not present

## 2014-11-26 DIAGNOSIS — C7952 Secondary malignant neoplasm of bone marrow: Principal | ICD-10-CM

## 2014-11-26 DIAGNOSIS — C7800 Secondary malignant neoplasm of unspecified lung: Secondary | ICD-10-CM | POA: Diagnosis not present

## 2014-11-26 DIAGNOSIS — Z79891 Long term (current) use of opiate analgesic: Secondary | ICD-10-CM | POA: Diagnosis not present

## 2014-11-26 DIAGNOSIS — M25551 Pain in right hip: Secondary | ICD-10-CM | POA: Diagnosis not present

## 2014-11-26 NOTE — Progress Notes (Signed)
CC: Dr. Gunnar Bulla Magrinat  Follow-up note:  Mr. 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  I last saw the patient on 06/10/2014 when he finished a single fraction of palliative radiotherapy to his left ischium.  He never returned for a follow-up visit and I see that he is missed numerous follow-up visits with Dr. Dr. Jana Hakim as well.  I do not believe that he is been seen since mid-November here at the [facility].  He has been managing his pain with slow release morphine sulfate, 60 mg by mouth twice a day and also hydromorphone 4 mg by mouth every 6 hours when necessary.  He tells me he is been taking approximately 2 hydromorphone tablets a day.  He has lost approximately 30 pounds over the past 5 months when he was last seen in mid November.  He states that he has deteriorated significantly over the past month and his appetite is deteriorating.  One month ago he noted a "knot" along his right breast and has also been having worsening pain along his right lower back and right pelvis.  His right hip pain is worse with weightbearing.  He is not involved with hospice.  He has not had any imaging studies this year.  His MRI scan of the total spine on 06/18/2014 showed worsening metastatic disease throughout all spinal segments, particularly at C3, T12, and the first sacral segment.  He ambulates slowly with a cane.  Previous radiation therapy: History of palliative radiotherapy to the thoracic spine "centered at the T9 vertebral body", with a dose of 3000 cGy/10 delivered at the [facility]. History of palliative external beam radiation therapy to the whole brain/upper mid cervical spine, 3000 cGy in 10 sessions (09/04/2013 through 09/24/2013) also status post abbreviated palliative radiotherapy to the lower cervical/upper thoracic spine (C7-T3) receiving 2100 cGy in 7 sessions and also abbreviated course of  palliative radiation therapy to the right scapula with the same fractionation. He did not receive his final 3 fractions of planned radiation therapy to his right scapula or lower cervical/upper thoracic spine (C7-T3). His status post radiation therapy to his upper lumbar spine (T12-L2) and sacrum as described above receiving 3000 cGy in 10 sessions completing this on 02/21/2014.  Lastly, he received a single fraction of 800 cGy to his left ischium, completing this on 06/10/2014.  Physical examination: Alert and oriented. Filed Vitals:   11/26/14 1547  BP: 110/63  Pulse: 112  Temp: 98.2 F (36.8 C)   Chest: There is a fixed 4 cm mass just beneath his right breast probably representing a bone metastasis with extension anteriorly into the soft tissues.  This is nontender to palpation.  Back: There is no palpable spinal discomfort although there is pain described along the right lower lumbar region and sacrum.  Pelvis: There is no palpable right pelvis discomfort although pain is described along the right groin/proximal femur.  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  He probably has symptomatically disease along his right pelvis and lower lumbar/sacral spine.  He may be a candidate for a very brief course of treatment.  I think it'll be reasonable to obtain plain films of the LS-spine and pelvis.  He would like to have these done next Tuesday, and I can work him in after he has plain films here at [facility].  Plan: As above.  [place]  minutes was spent face-to-face with the patient, primarily counseling the patient and coordinating his care.

## 2014-11-26 NOTE — Progress Notes (Signed)
[name] in today for increased pain in his right inguinal region, right hip and lower back.  His gait is slow and using a cane to ambulate.  He reports that in the past 30 days he has hardly eaten and experiences nausea after intake of food with intermittent emesis.   Drinking liquids, mostly orange juice and water.  He has lost 32 lbs since 07/02/14.   He admits to feeling very weak and questions if he needs to be admitted.  Pulse eleveated, but steady. BP 110/63 mmHg  Pulse 112  Temp(Src) 98.2 F (36.8 C)  Ht 6\' 3"  (1.905 m)  Wt 156 lb 6.4 oz (70.943 kg)  BMI 19.55 kg/m2

## 2014-12-01 ENCOUNTER — Telehealth: Payer: Self-pay | Admitting: *Deleted

## 2014-12-01 NOTE — Telephone Encounter (Signed)
CALLED PATIENT TO REMIND OF X-RAYS AND FU TO GET RESULTS FOR 12-02-14, LVM FOR A RETURN CALL

## 2014-12-01 NOTE — Telephone Encounter (Signed)
CALLED PATIENT TO REMIND TO GET X-RAYS FOR 12-02-14, AND HIS FU WITH DR. Valere Dross, SPOKE WITH PATIENT AND HE IS AWARE OF THESE APPTS.

## 2014-12-02 ENCOUNTER — Telehealth: Payer: Self-pay | Admitting: *Deleted

## 2014-12-02 ENCOUNTER — Ambulatory Visit: Admission: RE | Admit: 2014-12-02 | Payer: Medicaid Other | Source: Ambulatory Visit | Admitting: Radiation Oncology

## 2014-12-02 NOTE — Telephone Encounter (Signed)
CALLED PATIENT TO INFORM THAT I RESCHEDULED HIS X-RAYS AND HIS FU TO 12-04-14, PATIENT AGREED TO NEW APPT. DAY AND TIME

## 2014-12-04 ENCOUNTER — Ambulatory Visit (HOSPITAL_COMMUNITY)
Admission: RE | Admit: 2014-12-04 | Discharge: 2014-12-04 | Disposition: A | Discharge status: Home / Self Care | Payer: Medicaid Other | Source: Ambulatory Visit | Attending: Radiation Oncology | Admitting: Radiation Oncology

## 2014-12-04 ENCOUNTER — Encounter: Payer: Self-pay | Admitting: Radiation Oncology

## 2014-12-04 ENCOUNTER — Encounter: Payer: Self-pay | Admitting: *Deleted

## 2014-12-04 ENCOUNTER — Ambulatory Visit: Payer: Medicaid Other | Admitting: Radiation Oncology

## 2014-12-04 ENCOUNTER — Ambulatory Visit: Payer: Self-pay | Admitting: Radiation Oncology

## 2014-12-04 VITALS — BP 118/73 | HR 96 | Temp 97.9°F | Ht 75.0 in | Wt 150.0 lb

## 2014-12-04 DIAGNOSIS — C7951 Secondary malignant neoplasm of bone: Secondary | ICD-10-CM | POA: Diagnosis not present

## 2014-12-04 DIAGNOSIS — C73 Malignant neoplasm of thyroid gland: Secondary | ICD-10-CM | POA: Diagnosis present

## 2014-12-04 DIAGNOSIS — C7952 Secondary malignant neoplasm of bone marrow: Principal | ICD-10-CM

## 2014-12-04 NOTE — Progress Notes (Signed)
[place] Work  Clinical Social Work was referred by patient for assessment of psychosocial needs due to housing concerns.  Clinical Social Worker attempted to meet with met with patient at [facility] to offer support and assess for needs.  Pt had to go get more xrays and CSW has to lead a group from 12-130 today. Pt had called voicing concerns re. [employer] voucher expiring. CSW notified RN of this issue and that pt was to bring in paperwork today. RN to make copy and CSW to follow up next week.   Loren Racer, [place] Worker [place]  [place] Phone: [phone] Fax: [phone]

## 2014-12-04 NOTE — Progress Notes (Addendum)
Frank Ward here today for review of X-rays completed in Radiology today.  Reports level 5/10 pain in the right lower back, right groin, right leg,right shoulder and base of skull when he turns his head (headache relieved with cool cloth).  Continues to loose weight due to poor appetite.  Nausea/Vomiting when eating or drinking juice, intermittently.  Frank Ward has lost  6 lbs since 11/25/13 and 38 lbs since 07/02/14.   BP 118/73 mmHg  Pulse 96  Temp(Src) 97.9 F (36.6 C)  Ht 6\' 3"  (1.905 m)  Wt 150 lb (68.04 kg)  BMI 18.75 kg/m2

## 2014-12-04 NOTE — Progress Notes (Signed)
CC: Dr. Gunnar Bulla Magrinat  Follow-up note:  The patient returns today after obtaining plain films of his LS-spine and pelvis.  His major complaint is that of right hip pain and also worsening right breast discomfort with an enlarging mass.  He had plain films of his pelvis and LS spine today.  There is progression of metastatic disease along his right ilium and also previously treated left ischium.  His right sacral lesion appears to be stable.  He has new onset compression fractures of L1, L3, and L4.  T12 is stable.  He tells me that he has been nauseated for the past 2 days.  He has not seen Dr. Dr. Jana Hakim in the recent past and he is missed 2-3 appointments.  Physical examination: Alert and oriented. Filed Vitals:   12/04/14 1151  BP: 118/73  Pulse: 96  Temp: 97.9 F (36.6 C)   There is palpable discomfort along the right pelvis/hip.  On examination of his right breast there is a fixed mass measuring approximately 4 cm and this is markedly tender to palpation.  Impression: Progressive metastatic carcinoma the thyroid with symptomatic disease from his right ilium and right breast.  I plan to give him a single fraction of 800 cGy to his right breast and right ilium.  He will return for CT simulation this Monday.  His treatment should be well tolerated.  Consent is signed today.  I brought up the issue of hospice with him today, and he is not interested in seeking hospice consultation/care.  Also of note is that he is having difficulty maintaining his apartment, and will have him seen by one of our social workers today.  Plan: As discussed above.  15 minutes was spent face-to-face with the patient, primarily counseling patient and coordinating his care.

## 2014-12-05 NOTE — Op Note (Signed)
PATIENT NAME:  Frank Ward, Frank Ward MR#:  [id number] DATE OF BIRTH:  Jun 11, 1971  DATE OF OPERATION:  01/29/2013  PREOPERATIVE DIAGNOSIS: Thyroid cancer.   POSTOPERATIVE DIAGNOSIS:  Thyroid cancer.   ATTENDING SURGEON: Roena Malady, M.D.   OPERATIONS PERFORMED:  1.  Total thyroidectomy, including substernal component. 2.  Laryngeal nerve monitoring.  OPERATIVE FINDINGS: Diffusely enlarged and firm and fibrotic right lobe of thyroid, which extended into the substernal area. Normal-appearing left lobe of the thyroid.   DESCRIPTION OF PROCEDURE: Wilfrid was identified in the holding area, taken to the operating room and placed in the supine position. After general endotracheal anesthesia with a laryngeal nerve monitoring endotracheal tube, once the patient was intubated and generally  anesthetized, the neck was gently extended. A local anesthetic was injected over the incision mark which was just overlying the cricoid cartilage. A total of 3 mL was used. With the neck prepped and draped sterilely, a 15-blade was used to incise down to, and through, the platysma muscle. Hemostasis was achieved using the Bovie cautery. The strap muscles were identified and divided in the midline, and beginning on the right-hand side the strap muscles were retracted laterally. There was a very large, approximately 6 x 8 cm right lobe of the thyroid which was unable to come through the division strap of the muscles, therefore I divided approximately one-third of the right strap muscle, giving better access to the lateral compartment. The lateral aspect of the gland was then gently medialized. There was a large, inferior portion that extended into the substernal region. The tissue was then scooped up out of the substernal region and the vessels, which were feeding this portion of the gland, were divided using the Harmonic scalpel. We gently dissected in this substernal area. We did not identify any lymph nodes. Due to the  extent of this disease in the substernal area we did not want to jeopardize possibly a pneumothorax or any substernal bleeding. Therefore the gland was gently medialized and pulled into the center. The superior pole was identified. These vessels were isolated and divided using the Harmonic scalpel. We thought we identified the superior and inferior parathyroid glands and left them on their vascular pedicle as we medialized the gland. The recurrent laryngeal nerve was identified in the tracheoesophageal groove and stimulated, and remained intact throughout the case. The gland was then gently medialized over the anterior tracheal wall. There were multiple feeding vessels which were also divided using the Harmonic scalpel. With the right lobe dissected, the operation then turned to the left lobe where again the strap muscles were laterally retracted. The gland was then gently medialized. The superior pole vessels were isolated and divided using the Harmonic scalpel. The gland on the left-hand side appear relatively normal. As this gland was medialized the superior and inferior parathyroid glands were identified and left intact on their vascular pedicles. The recurrent laryngeal nerve was identified and the tracheoesophageal groove was stimulated and remained intact throughout the case. The gland was then medialized over the anterior tracheal wall and the gland was removed in its entirety once Berry's ligament was divided on the left.   On the right-hand side there was significant fibrosis as the muscle, during the dissection, was quite stuck to the gland laterally. With the total thyroid removed a stitch was placed in the portion of the left upper lobe and it was sent for permanent section. The wound was then copiously irrigated with saline. Any small bleeding points were cauterized using the  microbipolar. The recurrent laryngeal nerve was stimulated at the end of the case and continued to be intact. The Surgicel was  then placed in the neurovascular bed; #10 TLS drains were brought out of the wound inferiorly. On the right-hand side where we had divided the strap muscle, this was reapproximated using 4-0 Vicryl. The strap muscles and then reapproximated in the midline using 4-0 Vicryl. The platysmal layer was closed using 4-0 Vicryl. Subcutaneous tissues were closed using 4-0 Vicryl and the skin was closed using Dermabond. A Tegaderm was placed over the drains. The patient was then returned to anesthesia where he was awakened in the operating room and taken to the recovery room in stable condition.   CULTURES: None.   SPECIMENS: Total thyroid.   ESTIMATED BLOOD LOSS: Less than 40 mL.    ____________________________ Roena Malady, MD ctm:dm D: 01/29/2013 09:34:12 ET T: 01/29/2013 11:25:30 ET JOB#: [id number]  cc: Roena Malady, MD, <Dictator> Roena Malady MD ELECTRONICALLY SIGNED 02/01/2013 7:38

## 2014-12-05 NOTE — Consult Note (Signed)
PATIENT NAME:  Frank, Ward MR#:  [id number] DATE OF BIRTH:  1970/11/25  DATE OF CONSULTATION:  02/08/2013  REFERRING PHYSICIAN:   CONSULTING PHYSICIAN:  Gonzella Lex, MD  IDENTIFYING INFORMATION AND REASON FOR CONSULTATION:  A 44 year old gentleman currently being treated for thyroid cancer.  Consultation for evaluation of possible mood or thought disorder.   HISTORY OF PRESENT ILLNESS:  Information obtained from the patient and the chart.  The patient is in the hospital currently being treated and evaluated for metastatic thyroid cancer.  The consultation is for concerns about his mood.  Team reports some concern that the patient is not behaving in a manner consistent with the seriousness of his diagnosis.  Evidently there is also a concern because his family members report that he has had a lot of difficulties in his life.  The patient tells me that his mood is feeling fairly good right now.  He tells me that he understands that he has cancer and that it is very serious and that he needs radiation treatment, but that he is trying to keep his spirits upbeat.  He denies feeling sad or down.  He is able to cite multiple positive things in his life that he is looking forward to.  He does tend to have a perhaps more positive outlook on his future than might be justified, although I am not sure of that as I am not really clear on his prognosis either.  The patient reports that he is sleeping fairly well.  Up until the last couple days, he was having extreme pain, but since his pain medication regimen has been adjusted he now reports that his pain is under much better control.  He is still having a lot of soreness in his back, but it is no longer excruciating.  The patient totally denies any suicidal ideation.  He denies having any hallucinations, delusions or psychotic symptoms.  He says that he does feel anxious at times, but believes that this is appropriate under his circumstances.  He is not reporting  racing thoughts, obsessive anxiety or showing signs of any bizarre thinking.   PAST PSYCHIATRIC HISTORY:  He denies having any psychiatric treatment in the past.  Says that he has never been told that he had psychiatric illness.  No psychiatric hospitalizations.  No history of suicide attempts.  No history of violence.  No history of any psychiatric medicine.   SOCIAL HISTORY:  The patient has only been living in the [place] area for about two months.  He has been staying at the homeless shelter.  Prior to that, he was living in [place] in [facility].  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  He is not married.  He says that he does not have any children who are acknowledged as his own.  He says there is one child from a previous girlfriend whom he thinks might be his own, but he is not allowed to see them.  The patient has relatives living here in the area.  He is a little bit vague about exactly his relationships with all of them.  His aunt and uncle seem to be the closest to him, although he says other relations from the area have now been by to see him.  He tells me that he did complete high school and has done quite a bit of vocational training.  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  Eventually he tells me that there was a time while he was living in [facility] that he was working as an Passenger transport manager and actually doing quite well financially.  He lost his job under circumstances that he said he thinks were coordinated by an ex-girlfriend of his.  He says that for a while he thinks this woman was stalking him.  Since that time, he has not regained his financial foothold.  He also mentions that he had some legal trouble when he was in his early 76s.  He expresses quite a bit of shame and evasiveness about discussing this.   PAST MEDICAL HISTORY:  The patient is suffering from thyroid cancer.  He has had a thyroidectomy and has  multiple metastases.   SUBSTANCE ABUSE HISTORY:  At first he is slightly evasive about this, but later admits that he has had some problems with drinking in the past.  He had a DWI a couple years ago that resulted in him losing his driver's license.  This derailed some plans that he was making of trying to become a truck driver.  He says that he no longer drinks regularly.  He denies having a history of any other drug abuse.   FAMILY HISTORY:  Denies family history of mental illness.   REVIEW OF SYSTEMS:  Still having significant pain, especially in the back, although it is much better controlled with his current medication.  He says that he is sleeping better now that his pain is under control.  Denies suicidal or homicidal ideation.  Denies any hallucinations.  Does not report psychotic symptoms.  Does not feel hopeless about his situation.   MENTAL STATUS EXAMINATION:  Neatly groomed man who looks his stated age.  At first he was somewhat hesitant about the interview because it was later in the evening than he had expected, but he later came out of his room and asked me to come and see him.  We talked for almost an hour.  The patient was appropriate and pleasant during the interview.  He had appropriate eye contact.  Psychomotor activity was appropriate.  At some point, he was fidgety because of his back pain, but this appeared to be completely appropriate.  His speech was of normal rate, tone and volume.  His affect at times seemed to be somewhat shy and anxious.  Mood was stated as being hopeful.  Thoughts generally seemed to be lucid.  At times he is vague and almost evasive about certain elements of his life, but I do not feel like this is psychotic.  I did not get a sense that anything he was saying was obviously delusional.  He does not appear to be responding to internal stimuli or having hallucinations.  The patient appears to be of normal intelligence.  Judgment and insight appeared to be  reasonably good at least within the confines of his knowledge about his illness.  No suicidal or homicidal ideation voiced.   ASSESSMENT:  This is a 44 year old gentleman being treated for thyroid cancer.  He has recently been living in a homeless shelter.  At this point, I am still not exactly clear on the concern about the consult.  I do not see any evidence that he is suffering from a serious depression.  It is possible that he could have bipolar disorder.  This would be consistent with what sounds like some rocky ups and downs in his life, but that certainly is not diagnostic.  I  do not have any direct evidence of bipolar disorder.  I have no direct reason to think that he is having any psychotic symptoms.  What I do note is that he seems to have had a lot of shame about things that have happened to him in his life.  He is hesitant to discuss several things about his life such as his past criminal history, alcohol use, the circumstances around having lost his job and lost his financial standing and still feels quite a bit of shame around all these things.  As a result, he may come off as somewhat evasive at times.  It appears that he probably is somewhat estranged from his family.  I am still not clear on all of the details of that, but I do not have any evidence that psychosis is involved.   I do note that he seems at times to talk about the future in an optimistic manner that might be inconsistent with his illness.  The patient himself tells me that he feels he has gotten mixed messages from his doctors.  He says that different doctors have told him different things about his prognosis.  He says that only since Dr. Ermalinda Memos has been involved in his care has he understood that his illness is serious.  I am not sure that he really still understands exactly how serious it is, but I do not get the impression that this is related to psychosis on his part.   TREATMENT PLAN:  I do not see any indication of a  specific treatable axis I disorder.  I do think that the patient may be suffering from not having direct enough communication about his illness.  Perhaps some member of the treatment team needs to consolidate everything that is known about his condition and be very clear with him about his prognosis and future plans.  I will try to follow up with him next week.   DIAGNOSIS, PRINCIPAL AND PRIMARY:  AXIS I:  Adjustment disorder with anxiety.   SECONDARY DIAGNOSES: AXIS I:  No further.  AXIS II:  No diagnosis.  AXIS III:  Thyroid cancer, stage IV.  AXIS IV:  Severe, homeless, limited financial resources.  AXIS V:  Functioning at time of evaluation 37.    ____________________________ Gonzella Lex, MD jtc:ea D: 02/08/2013 23:19:17 ET T: 02/09/2013 01:27:08 ET JOB#: [id number]  cc: Gonzella Lex, MD, <Dictator> Gonzella Lex MD ELECTRONICALLY SIGNED 02/11/2013 9:26

## 2014-12-05 NOTE — Consult Note (Signed)
Details:   - Psychiatry: Followup for this man with metastatic thyroid cancer. Since I last saw him a discharge plan has been attempted. Evidently he is to be discharged tomorrow and has been offered the opportunity to live in a family care home but has been resistant to it. On interview today he is complaining much more strongly of his pain. Along with that he is feeling irritated and frustrated. He continues to complain that people are not giving him the straight story about his illness. He repeats a complaint to me that he is getting mixed messages about his head scan. This was an issue he was complaining about before the weekend and I would've thought he could of gotten it resolved. He also complains that he is not being given enough choice about a place to live.  Patient appears to be uncomfortable and more out of sorts. He is repeating himself more than he was the other day. Seems to be more preoccupied. He does not however seem to be responding to internal stimuli nor to be psychotic. His decision to not go to a family care home appears to be based on somewhat irrational thinking in that the homeless shelter would clearly be an even more unacceptable place for him.  I still do not see any sign of psychosis or major mood disorder however I see more evidence of his self-defeating and less than perfectly rational thinking. This suggests to me that there may be a greater underlying anxiety disorder or personality disorder. I still don't see any indication for specific psychiatric medicine. Attempted to do some supportive counseling to encourage him to make a decision that would be in his better interest. I will try to followup tomorrow if he is still here.   Electronic Signatures: Gonzella Lex (MD)  (Signed 30-Jun-14 21:20)  Authored: Details   Last Updated: 30-Jun-14 21:20 by Gonzella Lex (MD)

## 2014-12-05 NOTE — Consult Note (Signed)
   Comments   Pt just discharged from [facility] yesterday. He brought his medications with him today. I went over each of them in detail, explained the rationale for each and instructed him in how to take them. I will follow up with him in 1 week for medication adjustment if needed.   Electronic Signatures: Nataliyah Packham, Izora Gala (MD)  (Signed 02-Jul-14 15:14)  Authored: Palliative Care   Last Updated: 02-Jul-14 15:14 by Kadejah Sandiford, Izora Gala (MD)

## 2014-12-05 NOTE — Consult Note (Signed)
Brief Consult Note: Diagnosis: adjustment disorder with anxiety.   Patient was seen by consultant.   Consult note dictated.   Comments: Psychiatry: Patient seen, chart reviewed. This gentleman currently being treated for thyroid cancer was evaluated for concerns about his mood or thinking. At this point I do not see clear evidence of a Axis I psychiatric disorder requiring specific psychiatric treatment. Will follow her next week. See full note.  Electronic Signatures: Gonzella Lex (MD)  (Signed 27-Jun-14 23:04)  Authored: Brief Consult Note   Last Updated: 27-Jun-14 23:04 by Gonzella Lex (MD)

## 2014-12-05 NOTE — Consult Note (Signed)
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   Referred by Dr. Grayland Ormond.  Diagnosis:  Chief Complaint/Diagnosis   44 year old male with widespread metastatic thyroid cancer status post total thyroidectomy with thoracic spinal cord involvement of metastatic lesion.  Pathology Report pathology report reviewed   Imaging Report PET/CT scan reviewed   Referral Report clinical notes reviewed   Planned Treatment Regimen palliative radiation to thoracic spine   HPI   patient is a 44 year old male recently moved to this area who is homeless [facility].he had presented to the emergency room with significant acute onset of mid back pain CT scan showed multiple lung lesions as well as bony metastasis with a large lesion in T9 with almost complete destruction of the vertebral body.he underwent a biopsy of paraspinal mass at T9 which was positive for 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 His case was presented her weekly tumor conference and it was decided to go ahead with total thyroidectomy with the plan to treat with radioactive iodide after thyroidectomy. I have asked to evaluate the patient postoperatively. He is done well surgically although still is in considerable pain. By PET/CT has if not spinal cord involvement tumor is impinging on the cord. He is having no lower extremity weakness or sensory level at this time.his only complaint today is some difficulty with swallowing and I believe his uvula has been, edematous secondary to his recent surgery.  Past Hx:    thyroid cancer:    Negative, patient denies medical history.:    right forearm tendon repair:   Past, Family and Social History:  Past Medical History positive   Endocrine thyroid cancer as described above.   Family History positive    Family History Comments coronary artery disease, adult onset diabetes   Social History positive   Social History Comments 20-pack-year smoking history quit smoking 6 months prior no EtOH use history   Additional Past Medical and Surgical History seen by himself in hospital room today   Allergies:   No Known Allergies:   Home Meds:  Home Medications: Medication Instructions Status  Colace sodium 100 mg oral capsule 1 cap(s) orally 2 times a day Active  Dilaudid 4 mg oral tablet 1 tab(s) orally every 4 hours, As Needed Active   Review of Systems:  General negative   Performance Status (ECOG) 0   Skin negative   Breast negative   Ophthalmologic negative   ENMT negative   Respiratory and Thorax negative   Cardiovascular negative   Gastrointestinal negative   Genitourinary negative   Musculoskeletal negative   Neurological negative   Psychiatric negative   Hematology/Lymphatics negative   Endocrine see HPI   Allergic/Immunologic negative   Review of Systems   according to the nurse's notes prior to surgeryPatient denies any weight loss, fatigue, weakness, fever, chills or night sweats. Patient denies any loss of vision, blurred vision. Patient denies any ringing  of the ears or hearing loss. No irregular heartbeat. Patient denies heart murmur or history of fainting. Patient denies any chest pain or pain radiating to her upper extremities. Patient denies any shortness of breath, difficulty breathing at night, cough or hemoptysis. Patient denies any swelling in the lower legs. Patient denies any nausea vomiting, vomiting of blood, or coffee ground material in the vomitus. Patient denies any stomach pain. Patient  states has had normal bowel movements no significant constipation or diarrhea. Patient denies any dysuria, hematuria or significant nocturia. Patient denies any problems walking, swelling in the joints or loss of balance. Patient denies any skin changes, loss of  hair or loss of weight. Patient denies any excessive worrying or anxiety or significant depression. Patient denies any problems with insomnia. Patient denies excessive thirst, polyuria, polydipsia. Patient denies any swollen glands, patient denies easy bruising or easy bleeding. Patient denies any recent infections, allergies or URI. Patient "s visual fields have not changed significantly in recent time.  Physical Exam:  General/Skin/HEENT:  General normal   Skin normal   Eyes normal   ENMT normal   Head and Neck normal   Additional PE well-developed male in NAD. He does complain of significant pain in his mid back. Pain does radiate around to his stomach. Also oral cavity shows edematous uvula. No motor or sensory level is identified. Proprioception is intact. Lungs are clear to A&P cardiac examination shows regular rate and rhythm.   Breasts/Resp/CV/GI/GU:  Respiratory and Thorax normal   Cardiovascular normal   Gastrointestinal normal   Genitourinary normal   MS/Neuro/Psych/Lymph:  Musculoskeletal normal   Neurological normal   Lymphatics normal   Other Results:  Radiology Results: LabUnknown:    22-May-14 12:53, PET/CT Staging Thyroid  PACS Image     30-May-14 12:12, CT Guided Biopsy (Specify Area)  PACS Image   CT:  CT Guided Biopsy (Specify Area)   REASON FOR EXAM:    Rt Illium Lesion  COMMENTS:       PROCEDURE: CT  - CT GUIDED BIOPSY or ASPIRATION  - Jan 11 2013 12:12PM     RESULT: After discussing the risk and benefits of this procedure with the   patient informed consent was obtained. The back was sterilely prepped and   draped. Local anesthesia administered 1% lidocaine. IV conscious sedation   performed. Right paraspinal intensely PET positive soft tissue mass was   located under CT and core biopsy obtained with a 18-gauge Franseen   needle. This was followed by 3 separate core biopsies with an 18-gauge   achieve needle system. Pulmonary  cytopathologic results are positive. No   complications. Discharge instructions were given to the patient.    IMPRESSION:  Successful CT directed biopsy large paraspinal PET positive     lesion.        Verified By: Osa Craver, M.D., MD  Nuclear Med:    22-May-14 12:53, PET/CT Staging Thyroid  PET/CT Staging Thyroid   REASON FOR EXAM:    thyroid mass   vertical mass mulriple lung lesion  COMMENTS:       PROCEDURE: PET - PET/CT STAGING THYROID  - Jan 03 2013 12:53PM     RESULT: The patient is undergoing staging of a thyroid mass and lung   lesions. The patient's fasting blood glucose level was 89 mg/dL. The   patient received 13.2 mCi of F-18 labeled FDG at 10:31 a.m. with scanning   beginning at 11:39 a.m. Delayed imaging over the neck was performed at   beginning at 12:12 p.m. A noncontrast CT scan was performed at the same   sitting for coregistration attenuation correction.     Within the neck there is increased uptake within the C3 and C4 vertebral   bodies. Destructive lesions are demonstrated here. The maximal SUV is 8.0   with a mean of 4.9. More inferiorly there is increased uptake within the  laryngeal structures most compatible with phonation.    The right thyroid lobe is involved by a mass and there is intensely   increased uptake with maximal SUV of 18.4 with a mean of 10.8. There is a   left perihilar mass extending from the parenchyma centrally which   measures approximately 3 cm in greatest dimension and exhibits maximal   SUV of 11.9 with a mean of 6.8. There is a right paratracheal lymph node   which is not significantly enlarged but which exhibits increased uptake   with maximal SUV of 5.3 with a mean of 3.2. There are innumerable soft   tissue density nodules within both lungs exhibiting increased uptake.   Just above the right hemidiaphragm a dominant nodule measures a least 1.5   cm diameter and exhibits maximal SUV of 9.4 with a mean of  5.6.    Below the hemidiaphragms there is normal expected uptake within the   kidneys. I do not see abnormal uptake within the liver. There is a lymph     node in the medial cardiophrenic gutter on the right with maximal SUV of   7.1 with a mean of 5.0. I do not see abnormal uptake within the adrenal   glands.    There is increased uptake within a right lower lateral rib which exhibits   maximal SUV of 3.8 with a mean of 2. Thereis a destructive lesion in the   body of T9. Destruction of portions of body is loss of the posterior   elements with extension into the neural canal is suspected. The maximal   SUV of this lesion is 7.8 with a mean of 5. There is a lytic focus to the   left of midline in the body of T12 with maximal SUV of 4.8 with a mean of   2.9. There is an abnormal right paravertebral soft tissue mass adjacent   to the posterior elements of L1 which exhibits maximal SUV 12.1 with a   mean of 8.1. There is a mass resulting in destruction of portions of the   right sacral ala and the adjacent iliac bone. This exhibits maximal SUV   of 7.6 with a mean of 4.9. A lytic lesion in the anterior aspect of the     right iliac bone exhibits maximal SUV of 8.6 with a mean of 5.4    IMPRESSION:   1. There are widespread foci of abnormal uptake consistent with   metastatic disease. A dominant mass in the right thyroid lobe is present   suspicious for thyroid malignancy.  2. There are multiple bony metastases. At approximately T9 a large   destructive lesion likely invades the spinal canal. There are also   significant spinal lesions at C3 and C4 and T1. There are lytic   hypermetabolic lesions at P29 and L1. Lesions in the right sacral ala and   adjacent iliacbone and more anteriorly in the right iliac bone are   present.     Dictation Site: 1    Verified By: DAVID A. Martinique, M.D., MD   Relevent Results:   Relevant Scans and Labs PET and CT scans are reviewed.   Assessment  and Plan: Impression:   spinal cord impingement by metastatic thyroid cancer T9 level in 44 year old male with widespread metastatic thyroid cancer status post total thyroidectomy. Plan:   the stomach to go ahead on an emergent basis with radiation therapy to his T9 level. I will start and habits simulated today  on an urgent basis and we'll treat today with plan of 3000 cGy in 10 fractions. Risks and benefits of treatment including possible diarrhea, fatigue, alteration blood counts and skin reaction were all explained in detail to the patient. I would recommend patient be transferred to the oncology service for pain control and monitoring over the next several days and so this would also sure we can treat him with urgent radiation therapy as needed. I have discussed the case personally with Dr. Grayland Ormond.  I would like to take this opportunity to thank you for allowing me to continue to participate in this patient's care.  CC Referral:  cc: Dr. Tami Ribas   Electronic Signatures: Baruch Gouty, Roda Shutters (MD)  (Signed 18-Jun-14 11:31)  Authored: HPI, Diagnosis, Past Hx, PFSH, Allergies, Home Meds, ROS, Physical Exam, Other Results, Relevent Results, Encounter Assessment and Plan, CC Referring Physician   Last Updated: 18-Jun-14 11:31 by Armstead Peaks (MD)

## 2014-12-05 NOTE — Consult Note (Signed)
History of Present Illness:  Reason for Consult Stage IV thyroid cancer, pain control.   Date of Diagnosis 11-Jan-2013   HPI   Patient had his total thyroidectomy or this morning.  He continues to have significant pain and states the oxycodone does not help. He continues to have hoarseness of voice and difficulty swallowing. He otherwise feels well. He denies any weight loss.  He has no neurologic complaints.  He denies any recent fevers or illnesses.  He has no chest pain, cough or shortness of breath.    He denies any nausea, vomiting, constipation, or diarrhea.  He does not report any melena or hematochezia.  He has no urinary complaints.  Patient offers no further specific complaints.  PFSH:  Additional Past Medical and Surgical History Negative.  Family history:  CAD, diabetes.  Social history: Tobacco as above, occasional alcohol, patient is currently homeless and works as a Curator.   Review of Systems:  Performance Status (ECOG) 0   Review of Systems   As per HPI. Otherwise, 10 point system review was negative.   NURSING NOTES:  **Vital Signs.:   17-Jun-14 11:11   Vital Signs Type: Post-Procedure   Temperature Temperature (F): 97.7   Celsius: 36.5   Pulse Pulse: 68   Respirations Respirations: 18   Systolic BP Systolic BP: 397   Diastolic BP (mmHg) Diastolic BP (mmHg): 67   Mean BP: 89   Pulse Ox % Pulse Ox %: 98   Oxygen Delivery: Room Air/ 21 %   Physical Exam:  Physical Exam General: Well-developed, well-nourished, no acute distress. Eyes: Pink conjunctiva, anicteric sclera. HEENT: Surgical dressing CDI with 2 drains at midline Lungs: Clear to auscultation bilaterally. Heart: Regular rate and rhythm. [name], murmurs, or gallops. Abdomen: Soft, nontender, nondistended. No organomegaly noted, normoactive bowel sounds. Musculoskeletal: No edema, cyanosis, or clubbing. Neuro: Alert, answering all questions appropriately. Cranial nerves grossly  intact. Skin: No rashes or petechiae noted. Psych: Normal affect.    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  Now status post thyroidectomy. Plan:   1.  Thyroid cancer:  Patient subtotal thyroidectomy this morning.  When he recovers from his surgery, he will return to clinic for further evaluation and discussion of radioiodine ablation.  He expressed understanding and was in agreement with this plan. Pain: Patient states oxycodone does not work.  This was switched to IV Dilaudid. Fertility: Previously, patient was given information about [facility] fertility clinic. follow.  Electronic Signatures: Delight Hoh (MD)  (Signed 17-Jun-14 14:35)  Authored: HISTORY OF PRESENT ILLNESS, PFSH, ROS, NURSING NOTES, PE, ALLERGIES, HOME MEDICATIONS, LABS, ASSESSMENT AND PLAN   Last Updated: 17-Jun-14 14:35 by Delight Hoh (MD)

## 2014-12-08 ENCOUNTER — Ambulatory Visit
Admission: RE | Admit: 2014-12-08 | Discharge: 2014-12-08 | Disposition: A | Discharge status: Home / Self Care | Payer: Medicaid Other | Source: Ambulatory Visit | Attending: Radiation Oncology | Admitting: Radiation Oncology

## 2014-12-08 DIAGNOSIS — C7951 Secondary malignant neoplasm of bone: Secondary | ICD-10-CM

## 2014-12-08 DIAGNOSIS — C7952 Secondary malignant neoplasm of bone marrow: Principal | ICD-10-CM

## 2014-12-08 DIAGNOSIS — Z510 Encounter for antineoplastic radiation therapy: Secondary | ICD-10-CM | POA: Diagnosis not present

## 2014-12-08 NOTE — Progress Notes (Addendum)
Complex simulation/treatment planning note: The patient was taken to the CT simulator.  A Vac lock immobilization device was constructed.  His pelvis was scanned.  I chose an isocenter along his right pelvis.  His chest was scanned and I chose an isocenter along his right anterior chest bone metastasis.  The CT data sets were sent to the planning system where I contoured his right anterior chest rib metastasis CTV and also right pelvis CTV.  There is obvious overlap with his previous sacral field for treatment of his right pelvis.  He was set up RAO and LPO to his right pelvis.  2 sets of unique MLCs were designed to conform the field.  Again there is known overlap with his previous sacral field which received 3000 cGy in 10 sessions.  He is set up RAO to his right anterior chest wall.  A separate and unique block is constructed for a total of 4 complex treatment devices including his Vac lock immobilization.  I prescribing 800 cGy in a single fraction to both sites.

## 2014-12-09 ENCOUNTER — Ambulatory Visit
Admission: RE | Admit: 2014-12-09 | Discharge: 2014-12-09 | Disposition: A | Discharge status: Home / Self Care | Payer: Medicaid Other | Source: Ambulatory Visit | Attending: Radiation Oncology | Admitting: Radiation Oncology

## 2014-12-09 ENCOUNTER — Encounter: Payer: Self-pay | Admitting: Nutrition

## 2014-12-09 ENCOUNTER — Encounter: Payer: Self-pay | Admitting: Radiation Oncology

## 2014-12-09 ENCOUNTER — Encounter: Payer: Self-pay | Admitting: *Deleted

## 2014-12-09 DIAGNOSIS — Z510 Encounter for antineoplastic radiation therapy: Secondary | ICD-10-CM | POA: Diagnosis not present

## 2014-12-09 NOTE — Progress Notes (Signed)
Weekly Management Note:  Site: Right anterior chest/breast, and right hip/pelvis Current Dose:  800  cGy Projected Dose: 800  cGy  Narrative: The patient is seen today for routine under treatment assessment. CBCT/MVCT images/port films were reviewed. The chart was reviewed.   The patient underwent a single fraction of radiation therapy to his right pelvis and right anterior chest/breast.  Physical Examination: There were no vitals filed for this visit..  Weight:  .  No change.  Impression: Radiation therapy well tolerated.  Plan: The patient is to call me in one to 2 weeks to give me an update on his status.

## 2014-12-09 NOTE — Progress Notes (Signed)
[facility] End of Treatment Note  Name:Frank Ward  Date: 12/09/2014 RDE:[id number] DOB:1970/08/18   Status:outpatient    CC: No PCP Per Patient  Dr. Gunnar Bulla Magrinat  REFERRING PHYSICIAN: Dr. 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                  NARRATIVE:   His treatment was well tolerated.  Of note is that at the time of his CT simulation he was noted to have significant progression of his bony metastasis throughout his pelvis, and also progression of pulmonary metastases.  I offered referral to hospice, but he currently refuses.                       PLAN: The patient is to call me in one to 2 weeks to give me an update on his progress.

## 2014-12-09 NOTE — Progress Notes (Signed)
[place] Social Work  Clinical Social Work met with patient to review and complete healthcare advance directives.  Clinical Social Worker met with patient in [place] office.  The patient designated Virgie Dad as their primary healthcare agent and Elizbeth Squires as their secondary agent.  Patient also completed healthcare living will.    Clinical Social Worker notarized documents and made copies for patient/family. Clinical Social Worker will send documents to medical records to be scanned into patient's chart.  Patient also expressed concern for housing.  Patient is currently living with his mother.  Patient received a [employer] housing voucher from the housing authority which expired on 11/03/14.  Patient stated he misread the expiration date and must turn in a new voucher with an extension date by the end of the month or he will lose the apartment he has reserved in May.  CSW contacted patients caseworker at the housing authority and left a voicemail explaining patients situation and request for return phone call.  CSW will continue attempting to contact caseworker.         Clinical Social Worker encouraged patient/family to contact with any additional questions or concerns.  Johnnye Lana, MSW, LCSW, OSW-C Clinical Social Worker [facility] [phone]

## 2014-12-09 NOTE — Progress Notes (Signed)
Provided samples of Ensure Plus for patient.

## 2014-12-09 NOTE — Addendum Note (Signed)
Encounter addended by: Arloa Koh, MD on: 12/09/2014  6:16 PM<BR>     Documentation filed: Notes Section

## 2014-12-17 ENCOUNTER — Telehealth: Payer: Self-pay | Admitting: Oncology

## 2014-12-17 ENCOUNTER — Telehealth: Payer: Self-pay | Admitting: *Deleted

## 2014-12-17 ENCOUNTER — Other Ambulatory Visit: Payer: Self-pay | Admitting: *Deleted

## 2014-12-17 NOTE — Telephone Encounter (Signed)
Will Complete as per Thu RN, patient call was transferred to [place] with Dr. Jana Hakim.

## 2014-12-17 NOTE — Telephone Encounter (Signed)
Returned patient re message left yesterday afternoon to call patient asap. Spoke with patient and per patient he has a tumor growing out to his chest and needs a biopsy. Patient forwarded to triage.

## 2014-12-17 NOTE — Telephone Encounter (Signed)
This RN called pt and discussed his concerns.  Area is enlarging and pt is concerned for " what is it- I know I have cancer and can he do a biopsy ?"  This RN discussed with Frank Ward need to see MD for assessment first and then if needed get a biopsy- this RN offered for pt to come in today with symptom management.  Frank Ward states he is unable to come in today due to transportation issues and is able to come in Friday.  Per MD appointment time obtained. Reviewed with Frank Ward who verbalized understanding.

## 2014-12-17 NOTE — Telephone Encounter (Signed)
Another TC from patient requesting call back regarding needing a biopsy of a 'tumor' on his chest. Please call patient as soon as you can.

## 2014-12-19 ENCOUNTER — Encounter: Payer: Self-pay | Admitting: *Deleted

## 2014-12-19 ENCOUNTER — Other Ambulatory Visit (HOSPITAL_BASED_OUTPATIENT_CLINIC_OR_DEPARTMENT_OTHER): Payer: Medicaid Other

## 2014-12-19 ENCOUNTER — Ambulatory Visit (HOSPITAL_BASED_OUTPATIENT_CLINIC_OR_DEPARTMENT_OTHER): Payer: Medicaid Other | Admitting: Oncology

## 2014-12-19 ENCOUNTER — Other Ambulatory Visit: Payer: Self-pay | Admitting: *Deleted

## 2014-12-19 VITALS — BP 115/75 | HR 110 | Temp 98.1°F | Resp 18 | Ht 75.0 in | Wt 153.4 lb

## 2014-12-19 DIAGNOSIS — C73 Malignant neoplasm of thyroid gland: Secondary | ICD-10-CM | POA: Diagnosis present

## 2014-12-19 DIAGNOSIS — G893 Neoplasm related pain (acute) (chronic): Secondary | ICD-10-CM

## 2014-12-19 DIAGNOSIS — IMO0002 Reserved for concepts with insufficient information to code with codable children: Secondary | ICD-10-CM

## 2014-12-19 DIAGNOSIS — C7952 Secondary malignant neoplasm of bone marrow: Secondary | ICD-10-CM

## 2014-12-19 DIAGNOSIS — C7951 Secondary malignant neoplasm of bone: Secondary | ICD-10-CM

## 2014-12-19 DIAGNOSIS — C7931 Secondary malignant neoplasm of brain: Secondary | ICD-10-CM | POA: Diagnosis not present

## 2014-12-19 LAB — COMPREHENSIVE METABOLIC PANEL (CC13)
ALK PHOS: 94 U/L (ref 40–150)
ANION GAP: 14 meq/L — AB (ref 3–11)
AST: 16 U/L (ref 5–34)
Albumin: 2.9 g/dL — ABNORMAL LOW (ref 3.5–5.0)
BILIRUBIN TOTAL: 0.35 mg/dL (ref 0.20–1.20)
BUN: 4.6 mg/dL — ABNORMAL LOW (ref 7.0–26.0)
CO2: 25 meq/L (ref 22–29)
CREATININE: 0.8 mg/dL (ref 0.7–1.3)
Calcium: 8.4 mg/dL (ref 8.4–10.4)
Chloride: 96 mEq/L — ABNORMAL LOW (ref 98–109)
EGFR: >90 mL/min/{1.73_m2} (ref >90)
Glucose: 89 mg/dl (ref 70–140)
Potassium: 3.1 mEq/L — ABNORMAL LOW (ref 3.5–5.1)
Sodium: 136 mEq/L (ref 136–145)
Total Protein: 5.4 g/dL — ABNORMAL LOW (ref 6.4–8.3)

## 2014-12-19 LAB — CBC WITH DIFFERENTIAL/PLATELET
BASO%: 1.1 % (ref 0.0–2.0)
BASOS ABS: 0 10*3/uL (ref 0.0–0.1)
EOS%: 2.2 % (ref 0.0–7.0)
Eosinophils Absolute: 0.1 10*3/uL (ref 0.0–0.5)
HCT: 24.7 % — ABNORMAL LOW (ref 38.4–49.9)
HGB: 8.1 g/dL — ABNORMAL LOW (ref 13.0–17.1)
LYMPH#: 0.6 10*3/uL — AB (ref 0.9–3.3)
LYMPH%: 16.1 % (ref 14.0–49.0)
MCH: 27.8 pg (ref 27.2–33.4)
MCHC: 32.8 g/dL (ref 32.0–36.0)
MCV: 84.9 fL (ref 79.3–98.0)
MONO#: 0.4 10*3/uL (ref 0.1–0.9)
MONO%: 12.2 % (ref 0.0–14.0)
NEUT#: 2.5 10*3/uL (ref 1.5–6.5)
NEUT%: 68.4 % (ref 39.0–75.0)
NRBC: 0 % (ref 0–0)
Platelets: 347 10*3/uL (ref 140–400)
RBC: 2.91 10*6/uL — ABNORMAL LOW (ref 4.20–5.82)
RDW: 16.4 % — ABNORMAL HIGH (ref 11.0–14.6)
WBC: 3.6 10*3/uL — AB (ref 4.0–10.3)

## 2014-12-19 MED ORDER — DEXAMETHASONE 4 MG PO TABS: 4.0000 mg | ORAL_TABLET | Freq: Every morning | ORAL | Status: DC

## 2014-12-19 MED ORDER — DOXYCYCLINE HYCLATE 50 MG PO CAPS: 50.0000 mg | ORAL_CAPSULE | Freq: Every day | ORAL | Status: DC

## 2014-12-19 MED ORDER — LEVOTHYROXINE SODIUM 125 MCG PO TABS: 125.0000 ug | ORAL_TABLET | Freq: Every day | ORAL | Status: DC

## 2014-12-20 LAB — THYROGLOBULIN LEVEL: Thyroglobulin: 399.5 ng/mL — ABNORMAL HIGH (ref 2.8–40.9)

## 2014-12-20 NOTE — Progress Notes (Signed)
[place]  Telephone:([phone] Fax:(336) [phone]     ID: Frank Ward DOB: 02-05-1971  MR#: [id number]  YSA#:[id number]  Patient Care Team: No Pcp Per Patient as PCP - General (General Practice) PCP: No PCP Per Patient GYN: SU:  OTHER MD: Enis Gash, Delight Hoh  CHIEF COMPLAINT: metastatic thyroid cancer  CURRENT TREATMENT: Palliative analgesics, Synthroid  THYROID CANCER HISTORY: From Dr Shanon Brow Chism's 08/29/2013 summary:  "Frank Ward is a 44 y.o. male with a history of metastatic thyroid cancer diagnosed in July 2014 with findings on paraspinal biopsy. His medical oncology care has been rendered by Dr. Delight Hoh of [facility] whom I spoke to personally and reviewed his records.  Dr. Grayland Ormond states that patient was lost to followup over the past 5-6 months. He presented to Dr. Grayland Ormond for evaluation of thyroid mass, multiple lung and bone lesions suspicious for metastatic disease on Jan 02, 2013. This was a follow up appointment for an emergency room visit for acute onset backpain several weeks prior. During this time the patient was homeless. CT of Head on December 10, 2012 demonstrated no acute intracranial processes but CT Chest, Abd and Pelvis on the same date showed innumerable bilateral pulmonary nodules of varying sizes with an associated 3 x 3.5 cm soft tissue mass with associated bone destruction of the right ilium and a heterogeneous enlargement of the right thyroid gland with right inferior extension and leftward deviation of the trachea concerning malignancy. He had a PET scan (01/03/13) showed widespread foci of abnormal uptake consistent with metastatic disease and a dominant mass in the right thyroid lobe suspicious for thyroid malignancy with multiple bony metastases and a large T9 destructive lesion likely invading the spinal canal with significant spinal lesions at C3 and C4 and T1. The lesions are  T12 and L1 were lytic and hypermetabolic. There were also lesions in the right sacral and adjacent iliac bone and more anterior in the right iliac bone. 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 He had a total thyroidectomy on 01/29/2013 by Dr. Roena Malady.   CT scan of the head (02/05/2013) showed 2 lesions--an abnormal hyperdense focus in the posterior aspect of there right lateral ventricle measuring 10 x 12 mm and a 5 mm diameter hyperdense nodule in the fourth ventricle-- in the ventricle occupying but patient was asymptomatic. He underwent radiation to his T-9 spine based on significant pain and marked bony destruction with possible cord impingement. He had a total of 300 cGY/Fx in 10 fractions from 01/30/2013 to 02/12/2013 by Dr. Noreene Filbert. [facility]. He was on fentanyl patch 100 mcg q 3 days, hydormorphone 4 mg q 4 hours prn, dexamethasone 4 mg bid and gabapentin 600 mg tid. His fentanyl patch was increased to 150 mcg q 3 days.  He followed up with his medical oncologists in July 20 with his TSH greater than 35, ready to proceed with radioactive iodide ablation. Because of his social situation, he was required to be admitted to the hospital for treatment. His plan for treatment was radioactive iodide and if that did not work to consider sutent or other oral chemotherapies. He was scheduled for neurosurgery evaluation on July 29th, 2014. Treatment for his pain was managed by Dr. Izora Gala Phifer of Palliative care. He underwent radioactive iodide ablation on 05/02/2013 receiving  a dose of 1.76 mCi of iodine 131. However given the bulky widespread metastatic disease which was I-131 avid and the large metastatic tumor size in multiple foci the likelihood of these being completely treated with I-131 was felt to  be low and close follow-up was recommended. He was subsequently lost to follow-up.   Notable labs in May, 2014 included CA 19-9 of 43 (0-35 U/mL) and CEA of 1.4 (0.0 - 4.7 ng/mL), PSA of 1.7, AFP of 3.7, serum calcitonin less than 2.0 pg/mL, THY antithyroglobulin AB less than 1.0 IU/mL and a negative beta-HCG tumor marker. TSH on 03/01/2013 was 42.3 and repeated on 03/25/2013 and found to be 69.8.  He reports that he moved here from [place] about 2 months ago. He now lives with his mother. He presented to [facility] ED in July for right neck and shoulder pain. A CT of his neck and chest showed pathological fractures of c3 and C4 vertebrae with metastases to the cervical spine. He was evaluated by neurosurgery and recommended for an MRI of the spine but patient signed out AMA from the ED. He presented to [facility] ED on 1/14 due to right neck pain radiating down the shoulder and dizziness. MRI of the brain and C-spine showed multiple metastases to the brain, progressive pathological compression fracture of C3 and C4 with extensive extraosseous tumor spread "  His subsequent history is as detailed below  INTERVAL HISTORY: Frank Ward returns today after missing 3 other appointments with [place] earlier this year. He has become concerned because there is a mass growing in his right upper chest wall, over the breast area. He is losing weight. He continues to have pain in his right arm, back, and right leg. He tells me that he would like "something done". He did receive radiation to this area under Dr. Valere Dross late last month. He thinks that may have helped some.  REVIEW OF SYSTEMS: Frank Ward denies unusual or persistent headaches, nausea, vomiting, dizziness, gait imbalance, falls, or stiff neck. He was unable to tell me which medications he is taking. He does say that he does not need a refill of his MS Contin, but did need refill of his hydromorphone. He tells me that he does get constipated a little from the  narcotics. He sometimes uses stool softeners. He is still living in his brother's apartment, but tells me he has received a voucher from [employer] and hopes to have his own place soon. A detailed review of systems today was otherwise stable   PAST MEDICAL HISTORY: Past Medical History  Diagnosis Date  . Cancer     Thyroid  . Thyroid disease   . Hypertension   . Pneumonia   . Hx of radiation therapy 02/07/14- 02/21/14    T12-L2, sacrum 3000 cGy 10 sessions  . Hx of radiation therapy 09/04/13-09/24/13    whole brain, C7-T3, upper C spine, right scapulae  . Hx of radiation therapy 06/10/14    left ischium 1 fx 800 cGy    PAST SURGICAL HISTORY: Past Surgical History  Procedure Laterality Date  . Tendon repair    . Thyroidectomy      FAMILY HISTORY Family History  Problem Relation Age of Onset  . Diabetes Mother   . Diabetes Brother     SOCIAL HISTORY:  Patient is single, no children. He lives in [place] with his mother and sometimes his brother. He has also lived in [place] and [place].Marland Kitchen He quit smoking in January of 2014,  until then he smoked 1 ppd for 10 years.He reports that he drinks about 8 beers a week. He reports that he uses illicit drugs (Marijuana). 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 Smoking status: Former Smoker -- 10 years    Types: Cigarettes    Quit date: 08/29/2011  . Smokeless tobacco: Never Used  . Alcohol Use: 3.0 oz/week    5 Cans of beer per week     Comment: occ    Allergies  Allergen Reactions  . Penicillins Anaphylaxis, Hives and Itching  . Latex Hives and Itching  . Lyrica [Pregabalin] Swelling    Current Outpatient Prescriptions  Medication Sig Dispense Refill  . dexamethasone (DECADRON) 4 MG tablet Take 1 tablet (4 mg total) by mouth  every morning. 30 tablet 0  . docusate sodium (COLACE) 100 MG capsule Take 1 capsule (100 mg total) by mouth 2 (two) times daily as needed for mild constipation. 60 capsule 2  . doxycycline (VIBRAMYCIN) 50 MG capsule Take 1 capsule (50 mg total) by mouth daily. 30 capsule 3  . furosemide (LASIX) 20 MG tablet Take 1 tablet (20 mg total) by mouth daily. 30 tablet 0  . HYDROmorphone (DILAUDID) 4 MG tablet Take 1 tablet (4 mg total) by mouth every 4 (four) hours as needed. 40 tablet 0  . ibuprofen (ADVIL,MOTRIN) 200 MG tablet Take 200-400 mg by mouth every 6 (six) hours as needed for mild pain.    Marland Kitchen levothyroxine (SYNTHROID, LEVOTHROID) 125 MCG tablet Take 1 tablet (125 mcg total) by mouth daily before breakfast. 30 tablet 5  . LORazepam (ATIVAN) 1 MG tablet Take 1 tablet (1 mg total) by mouth every 8 (eight) hours as needed for anxiety. 30 tablet 1  . morphine (MS CONTIN) 60 MG 12 hr tablet Take 1 tablet (60 mg total) by mouth every 12 (twelve) hours. 60 tablet 0  . QUEtiapine (SEROQUEL XR) 50 MG TB24 24 hr tablet Take 2 tablets (100 mg total) by mouth daily. 30 each 0  . traZODone (DESYREL) 100 MG tablet Take 1 tablet (100 mg total) by mouth at bedtime as needed for sleep. 30 tablet 0   No current facility-administered medications for this visit.    OBJECTIVE: Middle-aged [place] American man who walks with a cane Filed Vitals:   12/19/14 1538  BP: 115/75  Pulse: 110  Temp: 98.1 F (36.7 C)  Resp: 18     Body mass index is 19.17 kg/(m^2).    Weight is down from 189 pounds November 2015 253 pounds currently.   ECOG FS:2 - Symptomatic, <50% confined to bed  Sclerae unicteric, EOMs intact Oropharynx shows no thrush or other lesions No cervical or supraclavicular adenopathy Lungs no rales or rhonchi Heart regular rate and rhythm Abd soft, nontender, positive bowel sounds MSK no focal spinal tenderness, no lower extremity lymphedema Neuro: nonfocal, oriented to year, place and person,  anxious affect Breasts: There is a mass in the superior aspect of the right breast measuring approximately 4 cm. It is not tender, erythematous, or painful to palpation. The right axilla is benign.    LAB RESULTS:  CMP     Component Value Date/Time   NA 136 12/19/2014 1609   NA 140 02/16/2014 0425   NA 138 04/13/2013 1420   K 3.1* 12/19/2014 1609   K  3.9 02/16/2014 0425   K 3.8 04/13/2013 1420   CL 103 02/16/2014 0425   CL 103 04/13/2013 1420   CO2 25 12/19/2014 1609   CO2 24 02/16/2014 0425   CO2 30 04/13/2013 1420   GLUCOSE 89 12/19/2014 1609   GLUCOSE 110* 02/16/2014 0425   GLUCOSE 84 04/13/2013 1420   BUN 4.6* 12/19/2014 1609   BUN 16 02/16/2014 0425   BUN 16 04/13/2013 1420   CREATININE 0.8 12/19/2014 1609   CREATININE 0.84 02/21/2014 0403   CREATININE 1.15 04/13/2013 1420   CALCIUM 8.4 12/19/2014 1609   CALCIUM 8.5 02/16/2014 0425   CALCIUM 8.7 04/13/2013 1420   PROT 5.4* 12/19/2014 1609   PROT 5.2* 02/12/2014 0405   PROT 6.5 04/13/2013 1420   ALBUMIN 2.9* 12/19/2014 1609   ALBUMIN 2.8* 02/12/2014 0405   ALBUMIN 3.3* 04/13/2013 1420   AST 16 12/19/2014 1609   AST 13 02/12/2014 0405   AST 15 04/13/2013 1420   ALT <6 12/19/2014 1609   ALT 15 02/12/2014 0405   ALT 33 04/13/2013 1420   ALKPHOS 94 12/19/2014 1609   ALKPHOS 89 02/12/2014 0405   ALKPHOS 107 04/13/2013 1420   BILITOT 0.35 12/19/2014 1609   BILITOT <0.2* 02/12/2014 0405   GFRNONAA >90 02/21/2014 0403   GFRNONAA >60 04/13/2013 1420   GFRAA >90 02/21/2014 0403   GFRAA >60 04/13/2013 1420    I No results found for: SPEP  Lab Results  Component Value Date   WBC 3.6* 12/19/2014   NEUTROABS 2.5 12/19/2014   HGB 8.1* 12/19/2014   HCT 24.7* 12/19/2014   MCV 84.9 12/19/2014   PLT 347 12/19/2014      Chemistry      Component Value Date/Time   NA 136 12/19/2014 1609   NA 140 02/16/2014 0425   NA 138 04/13/2013 1420   K 3.1* 12/19/2014 1609   K 3.9 02/16/2014 0425   K 3.8 04/13/2013 1420     CL 103 02/16/2014 0425   CL 103 04/13/2013 1420   CO2 25 12/19/2014 1609   CO2 24 02/16/2014 0425   CO2 30 04/13/2013 1420   BUN 4.6* 12/19/2014 1609   BUN 16 02/16/2014 0425   BUN 16 04/13/2013 1420   CREATININE 0.8 12/19/2014 1609   CREATININE 0.84 02/21/2014 0403   CREATININE 1.15 04/13/2013 1420      Component Value Date/Time   CALCIUM 8.4 12/19/2014 1609   CALCIUM 8.5 02/16/2014 0425   CALCIUM 8.7 04/13/2013 1420   ALKPHOS 94 12/19/2014 1609   ALKPHOS 89 02/12/2014 0405   ALKPHOS 107 04/13/2013 1420   AST 16 12/19/2014 1609   AST 13 02/12/2014 0405   AST 15 04/13/2013 1420   ALT <6 12/19/2014 1609   ALT 15 02/12/2014 0405   ALT 33 04/13/2013 1420   BILITOT 0.35 12/19/2014 1609   BILITOT <0.2* 02/12/2014 0405       No results found for: LABCA2  No components found for: LABCA125  No results for input(s): INR in the last 168 hours.  Urinalysis    Component Value Date/Time   COLORURINE YELLOW 02/07/2014 0355   APPEARANCEUR CLOUDY* 02/07/2014 0355   LABSPEC 1.011 02/07/2014 0355   PHURINE 5.5 02/07/2014 [place] 02/07/2014 0355   HGBUR NEGATIVE 02/07/2014 0355   BILIRUBINUR NEGATIVE 02/07/2014 0355   KETONESUR 15* 02/07/2014 0355   PROTEINUR NEGATIVE 02/07/2014 0355   UROBILINOGEN 1.0 02/07/2014 0355   NITRITE NEGATIVE 02/07/2014 0355   LEUKOCYTESUR NEGATIVE 02/07/2014  [id number]    STUDIES: Dg Lumbar Spine 2-3 Views  12/04/2014   CLINICAL DATA:  Worsening right back pain. History of thyroid cancer.  EXAM: LUMBAR SPINE - 2-3 VIEW  COMPARISON:  CT 09/05/2013.  FINDINGS: Soft tissue structures are unremarkable. Scoliosis noted of the lumbar spine concave right. Diffuse degenerative change. Prominent lytic lesions with its destructive bony changes noted of the right sacrum and ilium. Right ilium lesion appears to progressed. Mild compression fractures are now noted of L1, L3, and L4. Metastatic disease to these vertebral bodies cannot be excluded. T12  a stable.  IMPRESSION: 1. Prominent lytic lesion again noted of the right sacrum and right ilium. The right ilium lesion has increased in size. These findings are consistent with metastatic disease. 2. New onset compression fractures of L1, L3, and L4, most likely from metastatic disease. T12 is stable.   Electronically Signed   By: Marcello Moores  Register   On: 12/04/2014 12:05   Dg Hips Bilat With Pelvis 2v  12/04/2014   CLINICAL DATA:  Worsening pain.  Metastatic thyroid cancer.  EXAM: BILATERAL HIP (WITH PELVIS) 2 VIEWS  COMPARISON:  CT 09/05/2013.  FINDINGS: Prominent lytic lesion in noted of the right sacrum, the right ilium, the left ischium. Right ilium and left ischial lesions appear to progressed. These findings are consistent with metastatic disease.  IMPRESSION: Progressive changes of metastatic disease. Right ilium and left ischial known lesions appear to be increased in size. Right sacral lytic lesion appears stable.a   Electronically Signed   By: Marcello Moores  Register   On: 12/04/2014 12:08    ASSESSMENT: 44 y.o. with metastatic (Right) thyroid cancer diagnosed by paraspinal biopsy 01/11/2013 showing papillary thyroid carcinoma, columnar cell variant  (1) presented with back pain April 2014, staging studies showed lung and bone metastases  (2) s/p total thyroidectomy 01/29/2013 (Dr Beverly Gust)  (3) s/p I-131 , 252.3 mCu, 04/02/2013 (or 1.76 mCu 05/02/2013?)  A. Brain metastases noted on head CT 02/05/2013  (1) s/p palliative external beam radiation therapy to the whole brain/upper mid cervical spine, 3000 cGy in 10 sessions (09/04/2013 through 09/24/2013)  B. Spine lesions (has received radiation to C7-T3, T9, Right scapula, and T12-L2)  (2) s/p radiation to T9 (Dr Donella Stade) completed 02/12/2013  (3) Status post abbreviated palliative radiotherapy to the lower cervical/upper thoracic spine (C7-T3) receiving 2100 cGy in 7 sessions  (4) Status post  abbreviated course of palliative  radiation therapy to the right scapula with the same fractionation. He did not receive his final 3 fractions of planned radiation therapy to his right scapula or lower cervical/upper thoracic spine (C7-T3). (Dr. [name])  (5) More recently, he completed treatment to his lower thoracic/upper lumbar spine (T12-L2) and sacrum, receiving 3000 cGy in 10 sessions completed  his therapy on 02/21/2014. (Dr. Valere Dross)  C. 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 His psychiatric illness and poor social support further complicate therapy. I think he would be a good candidate for hospice. At this point however he wants "treatment". He specific goals are to "get rid of" the mass on his right chest, and to gain some weight.  I asked him to make sure he is taking his Synthroid, and his Decadron. The Decadron should help with the appetite and weight issue. However he is  clearly declining. He is understandably alarmed by this. I am going to get a CT of the chest and if possible a brain MRI to restage him at this point. He can see me again a week from now to discuss those results.  It would be helpful if the patient's family could be present at the next visit. I suggested that to Frank Ward. Today I did refill his dilated, 60 tablets, to take twice daily as needed, and encouraged him to use stool softeners as directed  MAGRINAT,GUSTAV C, MD   12/20/2014 12:05 PM

## 2014-12-22 LAB — TSH CHCC: TSH: 65.218 m(IU)/L — ABNORMAL HIGH (ref 0.320–4.118)

## 2014-12-23 ENCOUNTER — Telehealth: Payer: Self-pay

## 2014-12-23 LAB — THYROGLOBULIN ANTIBODY

## 2014-12-23 NOTE — Telephone Encounter (Signed)
No answer, will try again later.

## 2014-12-23 NOTE — Telephone Encounter (Signed)
Mr McGill from [employer] called requesting call back.

## 2014-12-23 NOTE — Telephone Encounter (Signed)
The form that was filled out for Frank Ward had a section that said "will the live in aid be full time or part time." The part time is checked with written in "don't know yet". Mr Frank Ward explained that Frank Ward is trying to increase his bedroom size to 2 bedrooms with the intention of the live in aid being full time and able to sleep in the apt. I explained that the CT and MRI that are due this week will help determine how much aid Frank Ward will need. Asked Mr Frank Ward to refax the form on Friday. The pt sees Dr Jana Hakim on Friday.

## 2014-12-24 ENCOUNTER — Ambulatory Visit (HOSPITAL_COMMUNITY)
Admission: RE | Admit: 2014-12-24 | Discharge: 2014-12-24 | Disposition: A | Discharge status: Home / Self Care | Payer: Medicaid Other | Source: Ambulatory Visit | Attending: Oncology | Admitting: Oncology

## 2014-12-24 ENCOUNTER — Encounter (HOSPITAL_COMMUNITY): Payer: Self-pay

## 2014-12-24 DIAGNOSIS — C7800 Secondary malignant neoplasm of unspecified lung: Secondary | ICD-10-CM | POA: Diagnosis present

## 2014-12-24 DIAGNOSIS — C7952 Secondary malignant neoplasm of bone marrow: Secondary | ICD-10-CM

## 2014-12-24 DIAGNOSIS — C7951 Secondary malignant neoplasm of bone: Secondary | ICD-10-CM | POA: Insufficient documentation

## 2014-12-24 DIAGNOSIS — C73 Malignant neoplasm of thyroid gland: Secondary | ICD-10-CM | POA: Insufficient documentation

## 2014-12-24 MED ORDER — IOHEXOL 300 MG/ML  SOLN
80.0000 mL | Freq: Once | INTRAMUSCULAR | Status: AC | PRN
  Administered 2014-12-24: 100 mL via INTRAVENOUS

## 2014-12-24 NOTE — Telephone Encounter (Signed)
Will follow up... thank you

## 2014-12-25 ENCOUNTER — Telehealth: Payer: Self-pay | Admitting: *Deleted

## 2014-12-25 NOTE — Telephone Encounter (Signed)
Call received from patient asking to speak with Dr. Virgie Dad nurse Val.  Voicemail received.  This nurse offered to help.  "No I need to speak with Val.  Is there a direct line I can call?"  Informed him at this time she's with a patient.  "Well give me her voicemail."  Call transferred, voicemail opened after extension [phone] rung.

## 2014-12-26 ENCOUNTER — Ambulatory Visit (HOSPITAL_BASED_OUTPATIENT_CLINIC_OR_DEPARTMENT_OTHER): Payer: Medicaid Other | Admitting: Oncology

## 2014-12-26 ENCOUNTER — Encounter: Payer: Self-pay | Admitting: *Deleted

## 2014-12-26 ENCOUNTER — Ambulatory Visit (HOSPITAL_COMMUNITY): Admission: RE | Admit: 2014-12-26 | Payer: Medicaid Other | Source: Ambulatory Visit

## 2014-12-26 ENCOUNTER — Telehealth: Payer: Self-pay | Admitting: *Deleted

## 2014-12-26 DIAGNOSIS — C7931 Secondary malignant neoplasm of brain: Secondary | ICD-10-CM

## 2014-12-26 DIAGNOSIS — C7951 Secondary malignant neoplasm of bone: Secondary | ICD-10-CM

## 2014-12-26 DIAGNOSIS — C73 Malignant neoplasm of thyroid gland: Secondary | ICD-10-CM

## 2014-12-26 NOTE — Telephone Encounter (Signed)
Per communication with SW this RN contacted Molly Maduro at [employer] at phone number [phone].  Obtained identified VM- detailed message left with information form faxed with verification of medical necessity for a 2 bedroom residence. This RN's name and direct return call number left on VM.  This RN then called pt to inform and discuss his verbalizations to SW that he will not be able to make appointments today.  Per conversation with [place] he states : " I am too sick and nauseated to come in " " I feel that things are going too slow and they could be doing things faster to get me better " " you do everything in one week and then I am sick " " this medication I am taking is making me sick and nauseated " " I need radiation or chemo "   Pt was direct and with  tangential  wording during conversation with statements of " I need something done " and then " people just expect me to do everything in one day " " if I come up there I want a treatment done "  Emiliano states he would like to come in Monday or Tuesday.  This RN informed pt currently no appointments available with MD or midlevel.  Mc did state his known cancer " and not enough is being done "  This RN validated pt's verbalized concerns as well as seriousness of his diagnosis including reason why would like to see him today. This RN also validated pt's diagnosis as " very serious and not a cancer that is easy to treat " and again reiterated best if he could arrange to come for scheduled visit today so treatment decisions could be discussed.  Tc stated " Dr Jana Hakim can call me on the phone with that "  Pt was agitated but not verbally abusive or rude in his conversation.  At end of conversation Kert states he " be up there Monday".  This RN informed pt best to call this RN in am on Monday and so we could see how to best bring him into the office so someone could see him. This RN validated  patients concerns as well as the goal of this office is to help him the best we can to be healthy and strong.   Phone call ended with Stephone stating he would call this RN on Monday.  This note will be sent to MD and SW for communication.

## 2014-12-26 NOTE — Progress Notes (Signed)
[place] Work  Clinical Social Work was referred by patient for assessment of psychosocial needs due to still working on supportive housing and section 8 voucher. Pt eager for a 2 bedroom apartment in order for family to come and stay with him. CSW contacted worker, Architect and left vm as Mr Loraine Maple is out of office until next week. CSW updated RN, Val as pt "having a rough day" and feels he can't make it today.   Clinical Social Worker offered CNA services at home as well through [facility] and he is not interested currently. CSW feels this would greatly benefit pt and provided forms to RN in case he reconsiders this service. Pt aware CSW attempting to assist and thankful. CSW to continue to follow and assist.     Clinical Social Work interventions: Supportive listening Pt advocacy  Loren Racer, [place]  [place] Phone: [phone] Fax: [phone]

## 2014-12-27 NOTE — Progress Notes (Signed)
No show.  My nurse called Frank Ward and tried to convince him to calm. Please see her note today

## 2014-12-29 ENCOUNTER — Other Ambulatory Visit: Payer: Self-pay | Admitting: *Deleted

## 2014-12-29 DIAGNOSIS — C73 Malignant neoplasm of thyroid gland: Secondary | ICD-10-CM

## 2014-12-29 DIAGNOSIS — C7951 Secondary malignant neoplasm of bone: Principal | ICD-10-CM

## 2015-01-27 ENCOUNTER — Other Ambulatory Visit: Payer: Self-pay | Admitting: *Deleted

## 2015-01-27 ENCOUNTER — Telehealth: Payer: Self-pay | Admitting: Oncology

## 2015-01-27 DIAGNOSIS — C7952 Secondary malignant neoplasm of bone marrow: Secondary | ICD-10-CM

## 2015-01-27 DIAGNOSIS — C7951 Secondary malignant neoplasm of bone: Secondary | ICD-10-CM

## 2015-01-27 DIAGNOSIS — C73 Malignant neoplasm of thyroid gland: Secondary | ICD-10-CM

## 2015-01-27 MED ORDER — LORAZEPAM 1 MG PO TABS: 1.0000 mg | ORAL_TABLET | Freq: Three times a day (TID) | ORAL | Status: DC | PRN

## 2015-01-27 MED ORDER — DEXAMETHASONE 4 MG PO TABS: 4.0000 mg | ORAL_TABLET | Freq: Every morning | ORAL | Status: DC

## 2015-01-27 MED ORDER — MORPHINE SULFATE ER 60 MG PO TBCR: 60.0000 mg | EXTENDED_RELEASE_TABLET | Freq: Two times a day (BID) | ORAL | Status: DC

## 2015-01-27 MED ORDER — LEVOTHYROXINE SODIUM 125 MCG PO TABS: 125.0000 ug | ORAL_TABLET | Freq: Every day | ORAL | Status: AC

## 2015-01-27 MED ORDER — HYDROMORPHONE HCL 4 MG PO TABS: 4.0000 mg | ORAL_TABLET | ORAL | Status: DC | PRN

## 2015-01-27 MED ORDER — TRAZODONE HCL 100 MG PO TABS: 100.0000 mg | ORAL_TABLET | Freq: Every evening | ORAL | Status: DC | PRN

## 2015-01-27 NOTE — Telephone Encounter (Signed)
Patient came in and requested appointment to see Dr.Maginat. Patient no showed for appointment with MRI & MG on 12/26/14. Was advised by MD/RN to schedule for next available for MD. I gave patient next available appointment for MD was 02/25/15. Patient refused to wait that long. RN advised patient could see symptom management if having acute problems. Patient stating he is having nausea, loss of appetite, and right arm pain. Gave patient appointment for 06/14 @ 1:45 patient wanted to wait till 06/15 @ 1:00 to see symptom management. Patient confirmed appointment for 06/15 @ 1:00.

## 2015-01-28 ENCOUNTER — Encounter: Payer: Self-pay | Admitting: Nurse Practitioner

## 2015-01-28 ENCOUNTER — Ambulatory Visit (HOSPITAL_BASED_OUTPATIENT_CLINIC_OR_DEPARTMENT_OTHER): Payer: Medicare Other

## 2015-01-28 ENCOUNTER — Ambulatory Visit (HOSPITAL_BASED_OUTPATIENT_CLINIC_OR_DEPARTMENT_OTHER): Payer: Medicare Other | Admitting: Nurse Practitioner

## 2015-01-28 ENCOUNTER — Other Ambulatory Visit: Payer: Self-pay | Admitting: *Deleted

## 2015-01-28 ENCOUNTER — Other Ambulatory Visit: Payer: Self-pay | Admitting: Oncology

## 2015-01-28 VITALS — BP 101/67 | HR 103 | Temp 97.9°F | Resp 18 | Wt 141.7 lb

## 2015-01-28 DIAGNOSIS — R112 Nausea with vomiting, unspecified: Secondary | ICD-10-CM | POA: Diagnosis not present

## 2015-01-28 DIAGNOSIS — G893 Neoplasm related pain (acute) (chronic): Secondary | ICD-10-CM | POA: Diagnosis not present

## 2015-01-28 DIAGNOSIS — R634 Abnormal weight loss: Secondary | ICD-10-CM

## 2015-01-28 DIAGNOSIS — C73 Malignant neoplasm of thyroid gland: Secondary | ICD-10-CM

## 2015-01-28 DIAGNOSIS — E860 Dehydration: Secondary | ICD-10-CM

## 2015-01-28 DIAGNOSIS — C7951 Secondary malignant neoplasm of bone: Secondary | ICD-10-CM

## 2015-01-28 LAB — CBC WITH DIFFERENTIAL/PLATELET
BASO%: 0.4 % (ref 0.0–2.0)
Basophils Absolute: 0 10*3/uL (ref 0.0–0.1)
EOS ABS: 0.1 10*3/uL (ref 0.0–0.5)
EOS%: 3.2 % (ref 0.0–7.0)
HEMATOCRIT: 30 % — AB (ref 38.4–49.9)
HGB: 9.8 g/dL — ABNORMAL LOW (ref 13.0–17.1)
LYMPH%: 24.6 % (ref 14.0–49.0)
MCH: 28.9 pg (ref 27.2–33.4)
MCHC: 32.7 g/dL (ref 32.0–36.0)
MCV: 88.5 fL (ref 79.3–98.0)
MONO#: 0.4 10*3/uL (ref 0.1–0.9)
MONO%: 13.7 % (ref 0.0–14.0)
NEUT%: 58.1 % (ref 39.0–75.0)
NEUTROS ABS: 1.7 10*3/uL (ref 1.5–6.5)
PLATELETS: 297 10*3/uL (ref 140–400)
RBC: 3.39 10*6/uL — AB (ref 4.20–5.82)
RDW: 16.8 % — ABNORMAL HIGH (ref 11.0–14.6)
WBC: 2.9 10*3/uL — AB (ref 4.0–10.3)
lymph#: 0.7 10*3/uL — ABNORMAL LOW (ref 0.9–3.3)

## 2015-01-28 LAB — BASIC METABOLIC PANEL (CC13)
ANION GAP: 8 meq/L (ref 3–11)
BUN: 3.4 mg/dL — ABNORMAL LOW (ref 7.0–26.0)
CALCIUM: 8.7 mg/dL (ref 8.4–10.4)
CO2: 26 mEq/L (ref 22–29)
Chloride: 105 mEq/L (ref 98–109)
Creatinine: 0.8 mg/dL (ref 0.7–1.3)
Glucose: 111 mg/dl (ref 70–140)
Potassium: 3.5 mEq/L (ref 3.5–5.1)
SODIUM: 139 meq/L (ref 136–145)

## 2015-01-28 NOTE — Assessment & Plan Note (Signed)
Patient continues to complain of some chronic, mild nausea; and occasional vomiting when he attempts to eat.  Patient continues to lose weight; and has lost 12 pounds in the past 5-6 weeks.  Patient was advised to try eat and multiple small meals throughout the day; and to push protein.  Patient already has anti-antiemetics to take at home if needed.

## 2015-01-28 NOTE — Assessment & Plan Note (Addendum)
Patient is status post thyroidectomy in June 2014.  Patient underwent radioactive iodide ablation in September 2014.  He is currently prescribed Synthroid in Decadron for treatment of his thyroid cancer; but states that he is only taking the Synthroid at this time.  Most recent TSH obtained approximately 5-6 weeks ago was 65.218.  Restaging scan obtained on 12/24/2014 revealed progression of disease.  Patient was advised to continue both the Synthroid in the Decadron as previously directed.  Patient underwent a long discussion with Dr. Jana Hakim regarding treatment options.  Patient was advised would review all with radiation oncology; to see if there was a possibility of any further radiation treatments.  Also discussed the possibility of receiving in-home palliative care; but patient stated that he has no interest in either in-home palliative care or hospice care at this time.  Patient missed his brain MRI that was scheduled for this past Monday, 01/26/2015.  Advised patient would need to reschedule the brain MRI for restaging purposes.  Will schedule.  Patient to meet again with Dr. Jana Hakim on 02/25/2015.

## 2015-01-28 NOTE — Progress Notes (Signed)
SYMPTOM MANAGEMENT CLINIC   HPI: Frank Ward 44 y.o. male diagnosed with thyroid cancer; with metastasis to the lungs, brain, spinal cord, and bone.  Patient is status post thyroidectomy in June 2014.  Patient underwent radioactive iodide ablation in September 2014.  He is currently prescribed Synthroid in Decadron for treatment of his thyroid cancer; but states that he is only taking the Synthroid at this time.  Most recent TSH obtained approximately 5-6 weeks ago was 65.218.  Restaging scan obtained on 12/24/2014 revealed progression of disease.  Patient is complaining of increasing discomfort to both his right shoulder and his right hip area.  He also reports some chronic nausea; and occasional vomiting when he tries to eat.  He has lost more weight.  He denies any recent fevers or chills.  HPI  ROS  Past Medical History  Diagnosis Date  . Cancer     Thyroid  . Thyroid disease   . Hypertension   . Pneumonia   . Hx of radiation therapy 02/07/14- 02/21/14    T12-L2, sacrum 3000 cGy 10 sessions  . Hx of radiation therapy 09/04/13-09/24/13    whole brain, C7-T3, upper C spine, right scapulae  . Hx of radiation therapy 06/10/14    left ischium 1 fx 800 cGy    Past Surgical History  Procedure Laterality Date  . Tendon repair    . 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    is allergic to penicillins; latex; and lyrica.    Medication List       This list is accurate as of: 01/28/15  5:30 PM.  Always use your most recent med list.               dexamethasone 4 MG tablet  Commonly known as:  DECADRON  Take 1 tablet (4 mg total) by mouth every morning.     docusate sodium 100 MG capsule  Commonly known as:  COLACE  Take 1 capsule (100 mg total) by mouth 2 (two) times daily as needed for mild constipation.     furosemide 20 MG tablet  Commonly known as:  LASIX  Take 1 tablet (20 mg total) by mouth daily.     HYDROmorphone 4 MG tablet  Commonly known as:  DILAUDID  Take 1 tablet (4 mg total) by mouth every 4 (four) hours as needed.     ibuprofen 200 MG tablet  Commonly known as:  ADVIL,MOTRIN  Take 200-400 mg by mouth every 6 (six) hours as needed for mild pain.     levothyroxine 125 MCG tablet  Commonly known as:  SYNTHROID, LEVOTHROID  Take 1 tablet (125 mcg total) by mouth daily before breakfast.     LORazepam 1 MG tablet  Commonly known as:  ATIVAN  Take 1 tablet (1 mg total) by mouth every 8 (eight) hours as needed for anxiety.     morphine 60 MG 12  hr tablet  Commonly known as:  MS CONTIN  Take 1 tablet (60 mg total) by mouth every 12 (twelve) hours.     QUEtiapine 50 MG Tb24 24 hr tablet  Commonly known as:  SEROQUEL XR  Take 2 tablets (100 mg total) by mouth daily.     traZODone 100 MG tablet  Commonly known as:  DESYREL  Take 1 tablet (100 mg total) by mouth at bedtime as needed for sleep.         PHYSICAL EXAMINATION  Oncology Vitals 01/28/2015 12/19/2014 12/04/2014 11/26/2014 07/02/2014 06/18/2014 06/11/2014  Height - 191 cm 191 cm 191 cm - 193 cm -  Weight 64.275 kg 69.582 kg 68.04 kg 70.943 kg 85.7 kg 83.915 kg 81.647 kg  Weight (lbs) 141 lbs 11 oz 153 lbs 6 oz 150 lbs 156 lbs 6 oz 188 lbs 15 oz 185 lbs  180 lbs  BMI (kg/m2) - 19.17 kg/m2 18.75 kg/m2 19.55 kg/m2 - 22.52 kg/m2 -  Temp 97.9 98.1 97.9 98.2 98.7 - 97.9  Pulse 103 110 96 112 102 - 95  Resp 18 18 - - 20 - 16  SpO2 98 - - - 99 - 100  BSA (m2) - 1.92 m2 1.9 m2 1.94 m2 - 2.12 m2 -   BP Readings from Last 3 Encounters:  01/28/15 101/67  12/19/14 115/75  12/04/14 118/73    Physical Exam  Constitutional: He is oriented to person, place, and time.  Thin, frail, chronically ill looking.  HENT:  Head: Normocephalic and atraumatic.  Eyes: Conjunctivae and EOM are normal. Pupils are equal, round, and reactive to light. Right eye exhibits no discharge. Left eye exhibits no discharge. No scleral icterus.  Neck: Normal range of motion. Neck supple.  Pulmonary/Chest: Effort normal. No respiratory distress.  Musculoskeletal: Normal range of motion. He exhibits tenderness. He exhibits no edema.  Tenderness to the right shoulder area in the right hip area with palpation and occasional movement.  Patient ambulating with the assistance of a cane.  Neurological: He is alert and oriented to person, place, and time.  Skin: Skin is warm and dry.  Right chest wall area at nipple with obvious mass.  This area is nontender with palpation.  Psychiatric:  Patient was anxious during visit.  Nursing note and vitals reviewed.   LABORATORY DATA:. Appointment on 01/28/2015  Component Date Value Ref Range Status  . WBC 01/28/2015 2.9* 4.0 - 10.3 10e3/uL Final  . NEUT# 01/28/2015 1.7  1.5 - 6.5 10e3/uL Final  . HGB 01/28/2015 9.8* 13.0 - 17.1 g/dL Final  . HCT 01/28/2015 30.0* 38.4 - 49.9 % Final  . Platelets 01/28/2015 297  140 - 400 10e3/uL Final  . MCV 01/28/2015 88.5  79.3 - 98.0 fL Final  . MCH 01/28/2015 28.9  27.2 - 33.4 pg Final  . MCHC 01/28/2015 32.7  32.0 - 36.0 g/dL Final  . RBC 01/28/2015 3.39* 4.20 - 5.82 10e6/uL Final  . RDW 01/28/2015 16.8* 11.0 - 14.6 % Final  . lymph# 01/28/2015 0.7* 0.9 - 3.3 10e3/uL Final  . MONO# 01/28/2015  0.4  0.1 - 0.9 10e3/uL Final  . Eosinophils Absolute 01/28/2015 0.1  0.0 - 0.5 10e3/uL Final  . Basophils Absolute 01/28/2015 0.0  0.0 - 0.1 10e3/uL Final  . NEUT% 01/28/2015 58.1  39.0 - 75.0 % Final  . LYMPH% 01/28/2015 24.6  14.0 - 49.0 % Final  . MONO% 01/28/2015 13.7  0.0 - 14.0 % Final  . EOS% 01/28/2015 3.2  0.0 -  7.0 % Final  . BASO% 01/28/2015 0.4  0.0 - 2.0 % Final  . Sodium 01/28/2015 139  136 - 145 mEq/L Final  . Potassium 01/28/2015 3.5  3.5 - 5.1 mEq/L Final  . Chloride 01/28/2015 105  98 - 109 mEq/L Final  . CO2 01/28/2015 26  22 - 29 mEq/L Final  . Glucose 01/28/2015 111  70 - 140 mg/dl Final  . BUN 01/28/2015 3.4* 7.0 - 26.0 mg/dL Final  . Creatinine 01/28/2015 0.8  0.7 - 1.3 mg/dL Final  . Calcium 01/28/2015 8.7  8.4 - 10.4 mg/dL Final  . Anion Gap 01/28/2015 8  3 - 11 mEq/L Final  . EGFR 01/28/2015 >90  >90 ml/min/1.73 m2 Final   eGFR is calculated using the CKD-EPI Creatinine Equation (2009)     RADIOGRAPHIC STUDIES: No results found.  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  Patient underwent radioactive iodide ablation in September 2014.  He is currently prescribed Synthroid in Decadron for treatment of his thyroid cancer; but states that he is only taking the Synthroid at this time.  Most recent TSH obtained approximately 5-6 weeks ago was 65.218.  Restaging scan obtained on 12/24/2014 revealed progression of disease.  Patient was advised to continue both the Synthroid in the Decadron as previously directed.  Patient underwent a long discussion with Dr. Jana Hakim regarding treatment options.  Patient was advised would review all with radiation oncology; to see if there was a possibility of any further radiation treatments.  Also discussed the possibility of receiving in-home palliative care; but patient stated that he has no interest in either in-home palliative care  or hospice care at this time.  Patient missed his brain MRI that was scheduled for this past Monday, 01/26/2015.  Advised patient would need to reschedule the brain MRI for restaging purposes.  Will schedule.  Patient to meet again with Dr. Jana Hakim on 02/25/2015.  Cancer associated pain Patient continues to complain of right shoulder area pain; as well as right hip pain.  He continues to walk with the assistance of a cane.  Patient states that he continues to take the MS Contin 60 mg twice daily as previously directed.  He states he is only taking the Dilaudid 4 mg tablets for breakthrough pain once to twice per day.  Restaging scans obtained on 12/24/2014 did reveal progression of disease.  Dr. Jana Hakim to review all with radiation oncology; to see if there is any further palliative radiation treatments to help manage pain.  Nausea with vomiting Patient continues to complain of some chronic, mild nausea; and occasional vomiting when he attempts to eat.  Patient continues to lose weight; and has lost 12 pounds in the past 5-6 weeks.  Patient was advised to try eat and multiple small meals throughout the day; and to push protein.  Patient already has anti-antiemetics to take at home if needed.  Weight loss Patient continues to complain of some chronic, mild nausea; and occasional vomiting when he attempts to eat.  Patient continues to lose weight; and has lost 12 pounds in the past 5-6 weeks.  Patient was advised to try eat and multiple small meals throughout the day; and to push protein.  Patient already has anti-antiemetics to take at home if needed.   Reviewed 12/24/14 scan results with patient today.   Patient stated understanding of all instructions; and was in agreement with this plan of care. The  patient knows to call the clinic with any problems, questions or concerns.   This was a shared visit with Dr. Jana Hakim today.  Total time spent with patient was 40 minutes;  with  greater than 75 percent of that time spent in face to face counseling regarding patient's symptoms,  and coordination of care and follow up.  Disclaimer: This note was dictated with voice recognition software. Similar sounding words can inadvertently be transcribed and may not be corrected upon review.   Drue Second, NP 01/28/2015    ADDENDUM: Mr Monforte situation is very difficult. He regularly does not show to his appointments. He comes in at irregular intervals demanding to be seen. He then does not follow up either with the tests that are ordered for him or with the medications that he supposedly is taking. For example it seems he is not taking his Synthroid, which is one benign medication that has a chance of slowing his cancer down.  Mr. Jacqualin Combes does not do this because he has malicious. He clearly has a major psychological problem and does carry a diagnosis of schizophrenia. Today he expressed significant paranoid ideation and it was difficult to get him to accept that we do care about them, we want to help him, and that we would be glad to treat him if we had treatment that was available.  Unfortunately I do not believe we have anything beyond palliative care for him. I did offer him home-based palliative care which I think would be helpful in terms of compliance with medications and assessment of symptoms at home. However he refused that.  He would like to have more radiation to his right hip area. I will ask radiation oncology if they feel that is a possibility. However he does not want to see the 2 radiation oncologists he has seen previously.  He has an appointment with me for mid July. I urged him to keep that appointment and if he cannot come for some reason to call and reschedule.  I personally saw this patient and performed a substantive portion of this encounter with the listed APP documented above.   Chauncey Cruel, MD Medical Oncology and Hematology [facility] [address] [place], [place] [address] Tel. [phone]    Fax. [phone]

## 2015-01-28 NOTE — Assessment & Plan Note (Signed)
Patient continues to complain of right shoulder area pain; as well as right hip pain.  He continues to walk with the assistance of a cane.  Patient states that he continues to take the MS Contin 60 mg twice daily as previously directed.  He states he is only taking the Dilaudid 4 mg tablets for breakthrough pain once to twice per day.  Restaging scans obtained on 12/24/2014 did reveal progression of disease.  Dr. Jana Hakim to review all with radiation oncology; to see if there is any further palliative radiation treatments to help manage pain.

## 2015-01-29 ENCOUNTER — Telehealth: Payer: Self-pay | Admitting: Oncology

## 2015-01-29 NOTE — Telephone Encounter (Signed)
Left a message at the home number as the cell number is a non working one.  Left a message to call and get appointments as well as to advise him to call central scheduling to reschedule his no show mri.

## 2015-02-23 NOTE — Progress Notes (Signed)
Histology and Location of Primary Cancer: metastatic thyroid cancer/bone metastases   Sites of Visceral and Bony Metastatic Disease: lungs, brain, spinal cord, and bone  Location(s) of Symptomatic Metastases: right hip,right scapula shoulder  Past/Anticipated chemotherapy by medical oncology, if any: Dr. Jana Hakim recommends palliative care.  Pain on a scale of 0-10 is:   If Spine Met(s), symptoms, if any, include:  Bowel/Bladder retention or incontinence (please describe):   Numbness or weakness in extremities (please describe):   Current Decadron regimen, if applicable: has not been taking decadron per Cyndee Bacon's note from 01/28/15..  Ambulatory status? Walker? Wheelchair?:   SAFETY ISSUES:  Prior radiation?s/p radiation to T9 (Dr Donella Stade) completed 02/12/2013, 09/04/13-09/24/13 - whole brain, C7-T3, upper C spine, right scapulae, 02/07/14- 02/21/14 - T12-L2, sacrum 3000 cGy 10 sessions,06/10/14 - left ischium 1 fx 800 cGy   Pacemaker/ICD? NO  Possible current pregnancy? no  Is the patient on methotrexate? no  Current Complaints / other details:

## 2015-02-24 ENCOUNTER — Ambulatory Visit
Admission: RE | Admit: 2015-02-24 | Discharge: 2015-02-24 | Disposition: A | Discharge status: Home / Self Care | Payer: Medicaid Other | Source: Ambulatory Visit | Attending: Radiation Oncology | Admitting: Radiation Oncology

## 2015-02-24 ENCOUNTER — Ambulatory Visit
Admission: RE | Admit: 2015-02-24 | Discharge: 2015-02-24 | Disposition: A | Discharge status: Home / Self Care | Payer: Medicare Other | Source: Ambulatory Visit | Attending: Radiation Oncology | Admitting: Radiation Oncology

## 2015-02-24 ENCOUNTER — Encounter: Payer: Self-pay | Admitting: Radiation Oncology

## 2015-02-24 ENCOUNTER — Ambulatory Visit: Payer: Medicaid Other | Admitting: Radiation Oncology

## 2015-02-24 ENCOUNTER — Ambulatory Visit: Payer: Medicare Other

## 2015-02-24 DIAGNOSIS — C73 Malignant neoplasm of thyroid gland: Secondary | ICD-10-CM | POA: Insufficient documentation

## 2015-02-24 DIAGNOSIS — C7951 Secondary malignant neoplasm of bone: Secondary | ICD-10-CM | POA: Insufficient documentation

## 2015-02-24 HISTORY — DX: Malignant neoplasm of bone and articular cartilage, unspecified: C41.9

## 2015-02-24 HISTORY — DX: Allergy, unspecified, initial encounter: T78.40XA

## 2015-02-26 ENCOUNTER — Ambulatory Visit: Payer: Medicare Other

## 2015-02-26 ENCOUNTER — Ambulatory Visit: Payer: Medicare Other | Admitting: Radiation Oncology

## 2015-02-26 ENCOUNTER — Ambulatory Visit (HOSPITAL_BASED_OUTPATIENT_CLINIC_OR_DEPARTMENT_OTHER): Payer: Medicare Other | Admitting: Nurse Practitioner

## 2015-02-26 ENCOUNTER — Other Ambulatory Visit: Payer: Self-pay | Admitting: *Deleted

## 2015-02-26 ENCOUNTER — Other Ambulatory Visit: Payer: Self-pay | Admitting: Nurse Practitioner

## 2015-02-26 ENCOUNTER — Encounter: Payer: Self-pay | Admitting: Nurse Practitioner

## 2015-02-26 ENCOUNTER — Ambulatory Visit: Admission: RE | Admit: 2015-02-26 | Payer: Medicare Other | Source: Ambulatory Visit | Admitting: Radiation Oncology

## 2015-02-26 ENCOUNTER — Ambulatory Visit
Admission: RE | Admit: 2015-02-26 | Discharge: 2015-02-26 | Disposition: A | Discharge status: Home / Self Care | Payer: Medicare Other | Source: Ambulatory Visit | Attending: Radiation Oncology | Admitting: Radiation Oncology

## 2015-02-26 VITALS — BP 120/71 | HR 98 | Temp 98.6°F | Resp 18 | Ht 75.0 in | Wt 145.1 lb

## 2015-02-26 DIAGNOSIS — C7931 Secondary malignant neoplasm of brain: Secondary | ICD-10-CM

## 2015-02-26 DIAGNOSIS — C7800 Secondary malignant neoplasm of unspecified lung: Secondary | ICD-10-CM

## 2015-02-26 DIAGNOSIS — C7951 Secondary malignant neoplasm of bone: Secondary | ICD-10-CM

## 2015-02-26 DIAGNOSIS — G893 Neoplasm related pain (acute) (chronic): Secondary | ICD-10-CM

## 2015-02-26 DIAGNOSIS — C73 Malignant neoplasm of thyroid gland: Secondary | ICD-10-CM

## 2015-02-26 DIAGNOSIS — C7952 Secondary malignant neoplasm of bone marrow: Secondary | ICD-10-CM

## 2015-02-26 MED ORDER — MORPHINE SULFATE ER 60 MG PO TBCR: 60.0000 mg | EXTENDED_RELEASE_TABLET | Freq: Two times a day (BID) | ORAL | Status: DC

## 2015-02-26 MED ORDER — HYDROMORPHONE HCL 4 MG PO TABS: 4.0000 mg | ORAL_TABLET | ORAL | Status: DC | PRN

## 2015-02-26 MED ORDER — FUROSEMIDE 20 MG PO TABS: 20.0000 mg | ORAL_TABLET | Freq: Every day | ORAL | Status: DC

## 2015-02-26 NOTE — Progress Notes (Signed)
[place]  [phone] [phone]     ID: Frank Ward DOB: [birth year]  MR#: [id number]  WYO#:[id number]  Patient Care Team: No Pcp Per Patient as PCP - General (General Practice) PCP: No PCP Per Patient GYN: SU:  OTHER MD: Enis Gash, Delight Hoh  CHIEF COMPLAINT: metastatic thyroid cancer  CURRENT TREATMENT: Palliative analgesics, Synthroid  THYROID CANCER HISTORY: From Dr Shanon Brow Chism's 08/29/2013 summary:  "Frank Ward is a 44 y.o. male with a history of metastatic thyroid cancer diagnosed in July 2014 with findings on paraspinal biopsy. His medical oncology care has been rendered by Dr. Delight Hoh of [facility] whom I spoke to personally and reviewed his records.  Dr. Grayland Ormond states that patient was lost to followup over the past 5-6 months. He presented to Dr. Grayland Ormond for evaluation of thyroid mass, multiple lung and bone lesions suspicious for metastatic disease on Jan 02, 2013. This was a follow up appointment for an emergency room visit for acute onset backpain several weeks prior. During this time the patient was homeless. CT of Head on December 10, 2012 demonstrated no acute intracranial processes but CT Chest, Abd and Pelvis on the same date showed innumerable bilateral pulmonary nodules of varying sizes with an associated 3 x 3.5 cm soft tissue mass with associated bone destruction of the right ilium and a heterogeneous enlargement of the right thyroid gland with right inferior extension and leftward deviation of the trachea concerning malignancy. He had a PET scan (01/03/13) showed widespread foci of abnormal uptake consistent with metastatic disease and a dominant mass in the right thyroid lobe suspicious for thyroid malignancy with multiple bony metastases and a large T9 destructive lesion likely invading the spinal canal with significant spinal lesions at C3 and C4 and T1. The lesions are  T12 and L1 were lytic and hypermetabolic. There were also lesions in the right sacral and adjacent iliac bone and more anterior in the right iliac bone. 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 He had a total thyroidectomy on 01/29/2013 by Dr. Roena Malady.   CT scan of the head (02/05/2013) showed 2 lesions--an abnormal hyperdense focus in the posterior aspect of there right lateral ventricle measuring 10 x 12 mm and a 5 mm diameter hyperdense nodule in the fourth ventricle-- in the ventricle occupying but patient was asymptomatic. He underwent radiation to his T-9 spine based on significant pain and marked bony destruction with possible cord impingement. He had a total of 300 cGY/Fx in 10 fractions from 01/30/2013 to 02/12/2013 by Dr. Noreene Filbert. [facility]. He was on fentanyl patch 100 mcg q 3 days, hydormorphone 4 mg q 4 hours prn, dexamethasone 4 mg bid and gabapentin 600 mg tid. His fentanyl patch was increased to 150 mcg q 3 days.  He followed up with his medical oncologists in July 20 with his TSH greater than 35, ready to proceed with radioactive iodide ablation. Because of his social situation, he was required to be admitted to the hospital for treatment. His plan for treatment was radioactive iodide and if that did not work to consider sutent or other oral chemotherapies. He was scheduled for neurosurgery evaluation on July 29th, 2014. Treatment for his pain was managed by Dr. Izora Gala Phifer of Palliative care. He underwent radioactive iodide ablation on 05/02/2013 receiving  a dose of 1.76 mCi of iodine 131. However given the bulky widespread metastatic disease which was I-131 avid and the large metastatic tumor size in multiple foci the likelihood of these being completely treated with I-131 was felt to  be low and close follow-up was recommended. He was subsequently lost to follow-up.   Notable labs in May, 2014 included CA 19-9 of 43 (0-35 U/mL) and CEA of 1.4 (0.0 - 4.7 ng/mL), PSA of 1.7, AFP of 3.7, serum calcitonin less than 2.0 pg/mL, THY antithyroglobulin AB less than 1.0 IU/mL and a negative beta-HCG tumor marker. TSH on 03/01/2013 was 42.3 and repeated on 03/25/2013 and found to be 69.8.  He reports that he moved here from [place] about 2 months ago. He now lives with his mother. He presented to [facility] ED in July for right neck and shoulder pain. A CT of his neck and chest showed pathological fractures of c3 and C4 vertebrae with metastases to the cervical spine. He was evaluated by neurosurgery and recommended for an MRI of the spine but patient signed out AMA from the ED. He presented to [facility] ED on 1/14 due to right neck pain radiating down the shoulder and dizziness. MRI of the brain and C-spine showed multiple metastases to the brain, progressive pathological compression fracture of C3 and C4 with extensive extraosseous tumor spread "  His subsequent history is as detailed below  INTERVAL HISTORY Frank Ward returns today for follow up of his metastatic thyroid cancer. He is very agitated thoughout the visit, claiming "everyone" lacks of concern for his condition. He would "like more done" about his cancer, specifically to the mass on his right chest. He had one radiation treatment to this area, but he is not pleased with the result. He would like a biopsy to confirm this is cancer. He continues to have right shoulder pain and was supposed to have a visit with Dr. Sondra Come regarding radiation to this area, but he missed this appointment yesterday.  REVIEW OF SYSTEMS: Frank Ward denies fevers, chills, nausea, vomiting, or changes in bladder habits. He uses MS contin and dilaudid PRN for his pain, and this does cause him to be constipated. He uses stool softeners PRN for this. His appetite  has improved since being on dexamethasone and he has gained 4lb since his last visit. He is able to walk steadier, but still has occasional numbness/tinging to his feet and swelling to his legs. He states he is taking his synthroid, but refuses to take seroquel because he does not believe he has a mental illness. He lives with this brother and sometimes stays with his mother, but he is still in pursuit of a 2 bedroom apartment which may be granted by the housing authority to allow him to have a live in home aid. A detailed review of systems is otherwise stable.   PAST MEDICAL HISTORY: Past Medical History  Diagnosis Date  . Cancer     Thyroid  . Thyroid disease   . Hypertension   . Pneumonia   . Hx of radiation therapy 02/07/14- 02/21/14    T12-L2, sacrum 3000 cGy 10 sessions  . Hx of radiation therapy 09/04/13-09/24/13    whole brain, C7-T3, upper C spine, right scapulae  . Hx of radiation therapy 06/10/14    left ischium 1 fx 800 cGy  . Allergy   . Bone cancer     sacrum,T12-L2,c-spine,lt ischium    PAST SURGICAL HISTORY: Past Surgical History  Procedure Laterality Date  .  Tendon repair    . Thyroidectomy      FAMILY HISTORY Family History  Problem Relation Age of Onset  . Diabetes Mother   . Diabetes Brother     SOCIAL HISTORY:  Patient is single, no children. He lives in [place] with his mother and sometimes his brother. He has also lived in [place] and [place].Marland Kitchen He quit smoking in January of 2014, until then he smoked 1 ppd for 10 years.He reports that he drinks about 8 beers a week. He reports that he uses illicit drugs (Marijuana). 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 Smoking status: Former Smoker -- 10 years    Types: Cigarettes    Quit date: 08/29/2011  . Smokeless  tobacco: Never Used  . Alcohol Use: 3.0 oz/week    5 Cans of beer per week     Comment: occ    Allergies  Allergen Reactions  . Penicillins Anaphylaxis, Hives and Itching  . Latex Hives and Itching  . Lyrica [Pregabalin] Swelling    Current Outpatient Prescriptions  Medication Sig Dispense Refill  . docusate sodium (COLACE) 100 MG capsule Take 1 capsule (100 mg total) by mouth 2 (two) times daily as needed for mild constipation. 60 capsule 2  . ibuprofen (ADVIL,MOTRIN) 200 MG tablet Take 200-400 mg by mouth every 6 (six) hours as needed for mild pain.    Marland Kitchen levothyroxine (SYNTHROID, LEVOTHROID) 125 MCG tablet Take 1 tablet (125 mcg total) by mouth daily before breakfast. 30 tablet 5  . traZODone (DESYREL) 100 MG tablet Take 1 tablet (100 mg total) by mouth at bedtime as needed for sleep. 30 tablet 0  . dexamethasone (DECADRON) 4 MG tablet Take 1 tablet (4 mg total) by mouth every morning. (Patient not taking: Reported on 01/28/2015) 30 tablet 0  . furosemide (LASIX) 20 MG tablet Take 1 tablet (20 mg total) by mouth daily. 30 tablet 0  . HYDROmorphone (DILAUDID) 4 MG tablet Take 1 tablet (4 mg total) by mouth every 4 (four) hours as needed. 40 tablet 0  . LORazepam (ATIVAN) 1 MG tablet Take 1 tablet (1 mg total) by mouth every 8 (eight) hours as needed for anxiety. (Patient not taking: Reported on 01/28/2015) 30 tablet 1  . morphine (MS CONTIN) 60 MG 12 hr tablet Take 1 tablet (60 mg total) by mouth every 12 (twelve) hours. 60 tablet 0  . QUEtiapine (SEROQUEL XR) 50 MG TB24 24 hr tablet Take 2 tablets (100 mg total) by mouth daily. (Patient not taking: Reported on 01/28/2015) 30 each 0   No current facility-administered medications for this visit.    OBJECTIVE: Middle-aged [place] American man who walks with a cane Filed Vitals:   02/26/15 1419  BP: 120/71  Pulse: 98  Temp: 98.6 F (37 C)  Resp: 18     Body mass index is 18.14 kg/(m^2).    Weight is down from 189 pounds November  2015. 145 pounds currently.  Skin: warm, dry  HEENT: sclerae anicteric, conjunctivae pink, oropharynx clear. No thrush or mucositis.  Lymph Nodes: No cervical or supraclavicular lymphadenopathy  Lungs: clear to auscultation bilaterally, no rales, wheezes, or rhonci  Heart: regular rate and rhythm  Abdomen: round, soft, non tender, positive bowel sounds  Musculoskeletal: No focal spinal tenderness, no peripheral edema  Neuro: non focal, frustrated affect, oriented  to time, place, person.  Breast: mobile non tender mass immediately inferior to the right breast, measuring 3-4cm. Residual hyperpigmentation from radiation. Right axilla benig   LAB RESULTS:  CMP     Component Value Date/Time   NA 139 01/28/2015 1327   NA 140 02/16/2014 0425   NA 138 04/13/2013 1420   K 3.5 01/28/2015 1327   K 3.9 02/16/2014 0425   K 3.8 04/13/2013 1420   CL 103 02/16/2014 0425   CL 103 04/13/2013 1420   CO2 26 01/28/2015 1327   CO2 24 02/16/2014 0425   CO2 30 04/13/2013 1420   GLUCOSE 111 01/28/2015 1327   GLUCOSE 110* 02/16/2014 0425   GLUCOSE 84 04/13/2013 1420   BUN 3.4* 01/28/2015 1327   BUN 16 02/16/2014 0425   BUN 16 04/13/2013 1420   CREATININE 0.8 01/28/2015 1327   CREATININE 0.84 02/21/2014 0403   CREATININE 1.15 04/13/2013 1420   CALCIUM 8.7 01/28/2015 1327   CALCIUM 8.5 02/16/2014 0425   CALCIUM 8.7 04/13/2013 1420   PROT 5.4* 12/19/2014 1609   PROT 5.2* 02/12/2014 0405   PROT 6.5 04/13/2013 1420   ALBUMIN 2.9* 12/19/2014 1609   ALBUMIN 2.8* 02/12/2014 0405   ALBUMIN 3.3* 04/13/2013 1420   AST 16 12/19/2014 1609   AST 13 02/12/2014 0405   AST 15 04/13/2013 1420   ALT <6 12/19/2014 1609   ALT 15 02/12/2014 0405   ALT 33 04/13/2013 1420   ALKPHOS 94 12/19/2014 1609   ALKPHOS 89 02/12/2014 0405   ALKPHOS 107 04/13/2013 1420   BILITOT 0.35 12/19/2014 1609   BILITOT <0.2* 02/12/2014 0405   GFRNONAA >90 02/21/2014 0403   GFRNONAA >60 04/13/2013 1420   GFRAA >90 02/21/2014  0403   GFRAA >60 04/13/2013 1420    I No results found for: SPEP  Lab Results  Component Value Date   WBC 2.9* 01/28/2015   NEUTROABS 1.7 01/28/2015   HGB 9.8* 01/28/2015   HCT 30.0* 01/28/2015   MCV 88.5 01/28/2015   PLT 297 01/28/2015      Chemistry      Component Value Date/Time   NA 139 01/28/2015 1327   NA 140 02/16/2014 0425   NA 138 04/13/2013 1420   K 3.5 01/28/2015 1327   K 3.9 02/16/2014 0425   K 3.8 04/13/2013 1420   CL 103 02/16/2014 0425   CL 103 04/13/2013 1420   CO2 26 01/28/2015 1327   CO2 24 02/16/2014 0425   CO2 30 04/13/2013 1420   BUN 3.4* 01/28/2015 1327   BUN 16 02/16/2014 0425   BUN 16 04/13/2013 1420   CREATININE 0.8 01/28/2015 1327   CREATININE 0.84 02/21/2014 0403   CREATININE 1.15 04/13/2013 1420      Component Value Date/Time   CALCIUM 8.7 01/28/2015 1327   CALCIUM 8.5 02/16/2014 0425   CALCIUM 8.7 04/13/2013 1420   ALKPHOS 94 12/19/2014 1609   ALKPHOS 89 02/12/2014 0405   ALKPHOS 107 04/13/2013 1420   AST 16 12/19/2014 1609   AST 13 02/12/2014 0405   AST 15 04/13/2013 1420   ALT <6 12/19/2014 1609   ALT 15 02/12/2014 0405   ALT 33 04/13/2013 1420   BILITOT 0.35 12/19/2014 1609   BILITOT <0.2* 02/12/2014 0405       No results found for: LABCA2  No components found for: LABCA125  No results for input(s): INR in the last 168 hours.  Urinalysis    Component Value Date/Time   COLORURINE YELLOW 02/07/2014 0355  APPEARANCEUR CLOUDY* 02/07/2014 0355   LABSPEC 1.011 02/07/2014 0355   PHURINE 5.5 02/07/2014 0355   GLUCOSEU NEGATIVE 02/07/2014 0355   HGBUR NEGATIVE 02/07/2014 0355   BILIRUBINUR NEGATIVE 02/07/2014 0355   KETONESUR 15* 02/07/2014 0355   PROTEINUR NEGATIVE 02/07/2014 0355   UROBILINOGEN 1.0 02/07/2014 0355   NITRITE NEGATIVE 02/07/2014 0355   LEUKOCYTESUR NEGATIVE 02/07/2014 0355    STUDIES: No results found.  ASSESSMENT: 44 y.o. with metastatic (Right) thyroid cancer diagnosed by paraspinal biopsy  01/11/2013 showing papillary thyroid carcinoma, columnar cell variant  (1) presented with back pain April 2014, staging studies showed lung and bone metastases  (2) s/p total thyroidectomy 01/29/2013 (Dr Beverly Gust)  (3) s/p I-131 , 252.3 mCu, 04/02/2013 (or 1.76 mCu 05/02/2013?)  A. Brain metastases noted on head CT 02/05/2013  (1) s/p palliative external beam radiation therapy to the whole brain/upper mid cervical spine, 3000 cGy in 10 sessions (09/04/2013 through 09/24/2013)  B. Spine lesions (has received radiation to C7-T3, T9, Right scapula, and T12-L2)  (2) s/p radiation to T9 (Dr Donella Stade) completed 02/12/2013  (3) Status post abbreviated palliative radiotherapy to the lower cervical/upper thoracic spine (C7-T3) receiving 2100 cGy in 7 sessions  (4) Status post  abbreviated course of palliative radiation therapy to the right scapula with the same fractionation. He did not receive his final 3 fractions of planned radiation therapy to his right scapula or lower cervical/upper thoracic spine (C7-T3). (Dr. [name])  (5) More recently, he completed treatment to his lower thoracic/upper lumbar spine (T12-L2) and sacrum, receiving 3000 cGy in 10 sessions completed  his therapy on 02/21/2014. (Dr. Valere Dross)  C. 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 As far as his cancer is concerned, I attempted to reassure him that he is not facing discrimination regarding his care. The team that is surrounding him is  doing the best they can to manage his incurable disease. To an extent, he understands this but he is limited psychologically from his diagnosis of schizophrenia. His paranoid waxed and waned throughout our conversation. He refuses to take the seroquel.  His ultimate concern at this point is his right chest mass, which he would like biopsied. He understood by the end of our visit, that this would not take place with out a CT scan first, so I have placed the appropriate orders and he understands central scheduling will give him a call regarding a specific date and time. In the meantime, he will continue on synthroid daily which is his best treatment option in terms of disease control. I am unsure he is entirely compliant with this medication, as he refused to stay for labs after our visit and his last TSH was high 2 months ago. He was given refills on his pain medicines and lasix today.  He would mention being seen at [facility] instead, but cited gas money as a concern. We are working to find funds for this request, but our managed care department states that he used the last of  his funds in December of last year. I was not able to acquire any for him today, but I have had my nurse place a call with our social worker to see if anything else might be done. Incidentally, he is agreeable to holding off on the referral while we await biopsy.  Finally, I have placed a referral back to radiation oncology, to see if there might be palliative options for the pain to his right shoulder and/or chest. He was actually supposed to have a visit with Dr. Sondra Come earlier this week, and Frank Ward did not show. He is no longer being scheduled with Dr. Valere Dross because of a pattern of not showing up for appointments with him either.   Frank Ward will return in 2 months for a follow up visit with Dr. Jana Hakim. He understands and agrees with this plan. He knows the goal of treatment in his case is control. He has been encouraged  to call with any issues that might arise before his next visit here.  Laurie Panda, NP   02/26/2015 5:56 PM     ADDENDUM: Mr Ward's case remains challenging. I don't have curative treatment for him. He does seem to be taking his Synthroid more regularly and this may account for his feeling a little better and gaining a little bit more weight. This will slow down but of course not resolve the progression of his metastatic thyroid cancer.  He stated that he would like a second opinion at a tertiary care center but when I offered to set this up for him he said he did not have gas money to get there. He wanted to be admitted so he could be further evaluated. I explained we do not admit people to do a CT scan of biopsy since these procedures can be performed as an outpatient. I told him that I thought he needed a chest CT if he wanted a biopsy because I would tells what the best place to biopsy would be. It would also help [place] restage his disease.  At this point what he would like to do is see radiation oncology again. He has missed multiple appointments there and it may not be possible to arrange for this but we will see if we can facilitate that. Otherwise she will see me again in 2 months.  I think a referral to in home palliative care would be very helpful. We did discuss that and we will see if that can be accomplished (we are not sure where he is living, since he does not answer when we asked him first current address).  I personally saw this patient and performed a substantive portion of this encounter with the listed APP documented above.   Chauncey Cruel, MD Medical Oncology and Hematology [facility] [address] [place], [place] [address] Tel. [phone]    Fax. [phone]

## 2015-02-27 ENCOUNTER — Telehealth: Payer: Self-pay | Admitting: Oncology

## 2015-02-27 ENCOUNTER — Other Ambulatory Visit: Payer: Self-pay | Admitting: Nurse Practitioner

## 2015-02-27 NOTE — Telephone Encounter (Signed)
Patient did not want to wait on appointments yesterday and was informed that I would call him. I spoke with Suanne Marker in [place] yesterday re an appointment with Dr. Sondra Come for 7/27. Appointment for 7/27 was later cancelled and entered for 7/14 @ 5pm. I checked back with radonc this morning speaking with Santiago Glad and patient left medonc yesterday went down to radonc inquiring about being seen and the appointment was moved to 7/14, however patient was not seen by Dr. Sondra Come yesterday and per Santiago Glad they will speak with Dr. Sondra Come re this matter when he returns on Monday and call patient with an appointment. I called patient today and left message re 7/21 lab/ct @ 12:30pm for lab and 1:30pm for ct. Also left appointments for lab/fu 9/26 @ 2pm and mailed avs report and appointments.

## 2015-03-02 ENCOUNTER — Telehealth: Payer: Self-pay | Admitting: Oncology

## 2015-03-02 NOTE — Telephone Encounter (Signed)
Left a message for Frank Ward to see if he would be able to see Dr. Sondra Come tomorrow at 2 pm.  Requested a return call.

## 2015-03-03 ENCOUNTER — Ambulatory Visit
Admission: RE | Admit: 2015-03-03 | Discharge: 2015-03-03 | Disposition: A | Discharge status: Home / Self Care | Payer: Medicare Other | Source: Ambulatory Visit | Attending: Radiation Oncology | Admitting: Radiation Oncology

## 2015-03-03 ENCOUNTER — Encounter: Payer: Self-pay | Admitting: Radiation Oncology

## 2015-03-03 VITALS — BP 115/63 | HR 106 | Temp 98.2°F | Resp 12 | Ht 75.0 in | Wt 138.4 lb

## 2015-03-03 DIAGNOSIS — C73 Malignant neoplasm of thyroid gland: Secondary | ICD-10-CM

## 2015-03-03 DIAGNOSIS — C7951 Secondary malignant neoplasm of bone: Secondary | ICD-10-CM | POA: Diagnosis not present

## 2015-03-03 NOTE — Progress Notes (Signed)
Please see the Nurse Progress Note in the MD Initial Consult Encounter for this patient. 

## 2015-03-03 NOTE — Progress Notes (Signed)
Radiation Oncology         [phone] ________________________________  Name: Frank Ward MRN: [id number]  Date: 03/03/2015  DOB: 01/23/1971  Reevaluation Visit Note  CC: No PCP Per Patient  Magrinat, Virgie Dad, MD    ICD-9-CM ICD-10-CM   1. 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 Jana Hakim. The patient is been having increasing pain in the right shoulder and right pelvis area. He is also having pain in the right anterior chest region. Patient is also noticed some pain radiating into his right arm denies any significant weakness in his right arm or hand. He denies any numbness in his right hand or arm. Patient is having increasing difficulty walking and light of his right hip pain. He denies any cough or breathing problems or hemoptysis. Patient is also complaining of ringing in sedation in his right ear. He denies any headaches or problems.                           ALLERGIES:  is allergic to penicillins; latex; and lyrica.  Meds: Current Outpatient Prescriptions  Medication Sig Dispense Refill  . dexamethasone (DECADRON) 4 MG tablet Take 1 tablet (4 mg total) by mouth every morning. 30 tablet 0  . docusate sodium (COLACE) 100 MG capsule Take 1 capsule (100 mg total) by mouth 2 (two) times daily as needed for mild constipation. 60 capsule 2  . furosemide (LASIX) 20 MG tablet Take 1 tablet (20 mg total) by mouth daily. 30 tablet 0  . HYDROmorphone (DILAUDID) 4 MG tablet Take 1 tablet (4 mg total) by mouth every 4 (four) hours as needed. 40 tablet 0  . ibuprofen (ADVIL,MOTRIN) 200 MG tablet Take 200-400 mg by mouth every 6 (six) hours as needed for mild pain.    Marland Kitchen levothyroxine (SYNTHROID, LEVOTHROID)  125 MCG tablet Take 1 tablet (125 mcg total) by mouth daily before breakfast. 30 tablet 5  . LORazepam (ATIVAN) 1 MG tablet Take 1 tablet (1 mg total) by mouth every 8 (eight) hours as needed for anxiety. 30 tablet 1  . morphine (MS CONTIN) 60 MG 12 hr tablet Take 1 tablet (60 mg total) by mouth every 12 (twelve) hours. 60 tablet 0  . QUEtiapine (SEROQUEL XR) 50 MG TB24 24 hr tablet Take 2 tablets (100 mg total) by mouth daily. 30 each 0  . traZODone (DESYREL) 100 MG tablet Take 1 tablet (100 mg total) by mouth at bedtime as needed for sleep. 30 tablet 0   No current facility-administered medications for this encounter.    Physical Findings: The patient is in no acute distress. Patient is alert and oriented.  height is 6\' 3"  (1.905 m) and weight is 138 lb 6.4 oz (62.778 kg). His oral temperature is 98.2 F (36.8 C). His blood pressure is 115/63 and his pulse is 106. His respiration is 12 and oxygen saturation is 99%. .  The lungs are clear. The heart has a regular rhythm and rate. Chest area shows a large soft tissue mass along the region of the right breast. There are radiation changes noted in this area in addition. Patient has some fullness in  the right shoulder area suspicious for mass effect. He complains of pain in the right shoulder and scapula area palpation. Patient is only able to abduct his right arm approximately 30 in light of Pain issues. Grip strength appears to be slightly decreased along the right side. Patient may have some slight weakness in his right lower extremity but this be related to pain issues.  Lab Findings: Lab Results  Component Value Date   WBC 2.9* 01/28/2015   HGB 9.8* 01/28/2015   HCT 30.0* 01/28/2015   MCV 88.5 01/28/2015   PLT 297 01/28/2015    Radiographic Findings: No results found.  Impression:  Metastatic thyroid cancer. Patient is becoming more symptomatic with tumor progression despite significant narcotic dosing.  Plan:  Patient will be set up  for CT simulation later today with treatments anticipated directed at the right shoulder and right anterior chest and right pelvis area. Patient has had several prior radiation treatments for management of his pain associated with osseous and soft tissue metastasis. I discussed with the patient that he is at risk for radiation injury in light of his significant overlap of fields and significant high dose fractions upon previous treatments. Patient understands that he could potentially have nerve damage resulting in paralysis. In light of the patient's significant pain level and his limited life expectancy he is willing to accept this increased risk. I anticipate approximately 5 treatments directed all 3 areas as mentioned above.  ____________________________________ [name], MD

## 2015-03-03 NOTE — Progress Notes (Addendum)
Histology and Location of Primary Cancer: metastatic thyroid cancer/bone metastases  Sites of Visceral and Bony Metastatic Disease: lungs, brain, spinal cord, and bone  Location(s) of Symptomatic Metastases: right hip radiation around to his lower back,right scapula/shoulder, mass under his right nipple.  Past/Anticipated chemotherapy by medical oncology, if any: Dr. Jana Hakim recommends palliative care.  Pain on a scale of 0-10 is: 4/10.  Takes morphine 60 mg twice a day and dilaudid 4 mg twice a day   If Spine Met(s), symptoms, if any, include:  Bowel/Bladder retention or incontinence: reports constipation. Last bm two days ago.   Numbness or weakness in extremities (please describe): right arm is weak and right leg from pain. Current Decadron regimen, if applicable: takes decadron 4 mg every other day  Ambulatory status? Walker? Wheelchair?:  Using a cane   SAFETY ISSUES:  Prior radiation?s/p radiation to T9 (Dr Donella Stade) completed 02/12/2013, 09/04/13-09/24/13 - whole brain, C7-T3, upper C spine, right scapulae, 02/07/14- 02/21/14 - T12-L2, sacrum 3000 cGy 10 sessions,06/10/14 - left ischium 1 fx 800 cGy  Pacemaker/ICD? NO  Possible current pregnancy? no  Is the patient on methotrexate? no  Current Complaints / other details: Reports a poor appetite.  Has lost 8 lbs since 02/26/15.    BP 115/63 mmHg  Pulse 106  Temp(Src) 98.2 F (36.8 C) (Oral)  Resp 12  Ht '6\' 3"'  (1.905 m)  Wt 138 lb 6.4 oz (62.778 kg)  BMI 17.30 kg/m2  SpO2 99%   Wt Readings from Last 3 Encounters:  03/03/15 138 lb 6.4 oz (62.778 kg)  02/26/15 145 lb 1.6 oz (65.817 kg)  01/28/15 141 lb 11.2 oz (64.275 kg)

## 2015-03-04 ENCOUNTER — Other Ambulatory Visit: Payer: Self-pay | Admitting: Nurse Practitioner

## 2015-03-04 DIAGNOSIS — C73 Malignant neoplasm of thyroid gland: Secondary | ICD-10-CM | POA: Diagnosis not present

## 2015-03-05 ENCOUNTER — Encounter: Payer: Self-pay | Admitting: Radiation Oncology

## 2015-03-05 ENCOUNTER — Ambulatory Visit: Payer: Self-pay

## 2015-03-05 ENCOUNTER — Ambulatory Visit
Admission: RE | Admit: 2015-03-05 | Discharge: 2015-03-05 | Disposition: A | Discharge status: Home / Self Care | Payer: Medicare Other | Source: Ambulatory Visit | Attending: Radiation Oncology | Admitting: Radiation Oncology

## 2015-03-05 ENCOUNTER — Ambulatory Visit (HOSPITAL_COMMUNITY): Payer: Medicare Other

## 2015-03-05 ENCOUNTER — Other Ambulatory Visit: Payer: Self-pay

## 2015-03-05 DIAGNOSIS — C73 Malignant neoplasm of thyroid gland: Secondary | ICD-10-CM | POA: Diagnosis not present

## 2015-03-05 DIAGNOSIS — C7952 Secondary malignant neoplasm of bone marrow: Principal | ICD-10-CM

## 2015-03-05 DIAGNOSIS — C7951 Secondary malignant neoplasm of bone: Secondary | ICD-10-CM

## 2015-03-05 NOTE — Progress Notes (Signed)
   Radiation Oncology         (336) [phone] ________________________________  Name: Frank Ward MRN: [id number]  Date: 03/05/2015  DOB: [birth year]  Simulation Verification Note  Diagnosis: Frank Ward is a 44 year old male presenting to clinic in regards to his primary malignant neoplasm of thyroid gland metastatic to bone.  Status: Outpatient  NARRATIVE: The patient was brought to the treatment unit and placed in the planned treatment position. The clinical setup was verified. Then port films were obtained and uploaded to the radiation oncology medical record software.  The treatment beams were carefully compared against the planned radiation fields. The position location and shape of the radiation fields was reviewed. They targeted volume of tissue appears to be appropriately covered by the radiation beams. Organs at risk appear to be excluded as planned.  Based on my personal review, I approved the simulation verification. The patient's treatment will proceed as planned. If the patient develops any further questions or concerns, she was encouraged to contact Dr. Sondra Come, PhD, MD.   This document serves as a record of services personally performed by [name], MD. It was created on his behalf by Lenn Cal, a trained medical scribe. The creation of this record is based on the scribe's personal observations and the provider's statements to them. This document has been checked and approved by the attending provider.    -----------------------------------  Blair Promise, PhD, MD

## 2015-03-05 NOTE — Progress Notes (Addendum)
  Radiation Oncology         [phone] ________________________________  Name: Frank Ward MRN: [id number]  Date: 03/03/2015  DOB: [birth year]  SIMULATION AND TREATMENT PLANNING NOTE    ICD-9-CM ICD-10-CM   1. 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  Identity was confirmed.  All relevant records and images related to the planned course of therapy were reviewed.  The patient freely provided informed written consent to proceed with treatment after reviewing the details related to the planned course of therapy. The consent form was witnessed and verified by the simulation staff.  Then, the patient was set-up in a stable reproducible  supine position for radiation therapy.  CT images were obtained.  Surface markings were placed.  The CT images were loaded into the planning software.  Then the target and avoidance structures were contoured.  Treatment planning then occurred.  The radiation prescription was entered and confirmed.  Then, I designed and supervised the construction of a total of 9 medically necessary complex treatment devices.  I have requested : Isodose Plan.  I have ordered:dose calc. 3-D simulation is requested for the treatments directed at the right anterior chest, in light of previous treatment and lung dose volume constraints.  PLAN:  The patient will receive 20 Gy in 5 fractions directed at all 3 treatment areas including the right shoulder , right anterior chest and right pelvis area.    Special treatment procedure note:  The patient has received previous radiation therapy on several occasions within our department. Additional time was taking and reviewing the patient's previous treatment as it relates to his current set up. Given this increased time in the potential for increased toxicities from overlapping fields, this constitutes a special  treatment procedure.  ________________________________  -----------------------------------  Blair Promise, PhD, MD

## 2015-03-06 ENCOUNTER — Ambulatory Visit: Payer: Medicare Other

## 2015-03-06 ENCOUNTER — Ambulatory Visit
Admission: RE | Admit: 2015-03-06 | Discharge: 2015-03-06 | Disposition: A | Discharge status: Home / Self Care | Payer: Medicare Other | Source: Ambulatory Visit | Attending: Radiation Oncology | Admitting: Radiation Oncology

## 2015-03-09 ENCOUNTER — Telehealth: Payer: Self-pay | Admitting: Oncology

## 2015-03-09 ENCOUNTER — Ambulatory Visit: Payer: Medicare Other

## 2015-03-09 ENCOUNTER — Ambulatory Visit
Admission: RE | Admit: 2015-03-09 | Discharge: 2015-03-09 | Disposition: A | Discharge status: Home / Self Care | Payer: Medicare Other | Source: Ambulatory Visit | Attending: Radiation Oncology | Admitting: Radiation Oncology

## 2015-03-09 NOTE — Telephone Encounter (Signed)
Called and left a message for [place] regarding his missed radiation treatment today.  Requested a return call.

## 2015-03-10 ENCOUNTER — Ambulatory Visit: Admission: RE | Admit: 2015-03-10 | Payer: Medicare Other | Source: Ambulatory Visit | Admitting: Radiation Oncology

## 2015-03-10 ENCOUNTER — Ambulatory Visit: Payer: Medicare Other

## 2015-03-10 ENCOUNTER — Ambulatory Visit
Admission: RE | Admit: 2015-03-10 | Discharge: 2015-03-10 | Disposition: A | Discharge status: Home / Self Care | Payer: Medicare Other | Source: Ambulatory Visit | Attending: Radiation Oncology | Admitting: Radiation Oncology

## 2015-03-11 ENCOUNTER — Ambulatory Visit: Payer: Medicare Other | Admitting: Radiation Oncology

## 2015-03-11 ENCOUNTER — Other Ambulatory Visit: Payer: Self-pay

## 2015-03-11 ENCOUNTER — Ambulatory Visit: Payer: Medicare Other

## 2015-03-11 ENCOUNTER — Encounter: Payer: Self-pay | Admitting: *Deleted

## 2015-03-11 ENCOUNTER — Ambulatory Visit (HOSPITAL_COMMUNITY): Admission: RE | Admit: 2015-03-11 | Payer: Medicare Other | Source: Ambulatory Visit

## 2015-03-11 NOTE — Progress Notes (Signed)
[place] Work  Clinical Social Work was referred by nurse and patient for assessment of psychosocial needs due to housing a gas assistance request.  Clinical Social Worker spoke with pt via phone, who is in the process of securing an apartment. He is working with [employer] for deposit assistance. Pt has wanted to get his own place for over a year, that would allow family to stay with him as needed. CSW completed referral to [employer] for pt and spoke with [employer]. They are hopeful they can extend funds to him for his deposit. Pt made aware of current state of request and problem solved on other possible options for further assistance. CSW will continue to assist and follow. Pt wants update in the next few days and CSW will check in once more information obtained.     Clinical Social Work interventions: Set designer and referral  Loren Racer, [place] Worker [place]  [place] Phone: [phone] Fax: [phone]

## 2015-03-12 ENCOUNTER — Ambulatory Visit: Admission: RE | Admit: 2015-03-12 | Payer: Medicare Other | Source: Ambulatory Visit

## 2015-03-12 ENCOUNTER — Ambulatory Visit: Payer: Medicare Other

## 2015-03-13 ENCOUNTER — Ambulatory Visit: Payer: Medicare Other

## 2015-03-13 NOTE — Addendum Note (Signed)
Encounter addended by: Jacqulyn Liner, RN on: 03/13/2015  8:48 AM<BR>     Documentation filed: Charges VN

## 2015-03-16 ENCOUNTER — Telehealth: Payer: Self-pay | Admitting: Oncology

## 2015-03-16 ENCOUNTER — Ambulatory Visit: Payer: Medicare Other

## 2015-03-16 ENCOUNTER — Ambulatory Visit
Admission: RE | Admit: 2015-03-16 | Discharge: 2015-03-16 | Disposition: A | Discharge status: Home / Self Care | Payer: Medicare Other | Source: Ambulatory Visit | Attending: Radiation Oncology | Admitting: Radiation Oncology

## 2015-03-16 NOTE — Telephone Encounter (Signed)
Called Mr. Migliaccio and left a message asking when he will be coming in for treatment.  Requested a return call.

## 2015-03-17 ENCOUNTER — Encounter: Payer: Self-pay | Admitting: *Deleted

## 2015-03-17 ENCOUNTER — Ambulatory Visit: Payer: Medicare Other

## 2015-03-17 ENCOUNTER — Ambulatory Visit
Admission: RE | Admit: 2015-03-17 | Discharge: 2015-03-17 | Disposition: A | Discharge status: Home / Self Care | Payer: Medicare Other | Source: Ambulatory Visit | Attending: Radiation Oncology | Admitting: Radiation Oncology

## 2015-03-17 ENCOUNTER — Telehealth: Payer: Self-pay | Admitting: Oncology

## 2015-03-17 NOTE — Progress Notes (Signed)
[place] Work  Holiday representative received phone call from patient expressing financial concerns.  Patient has been working on moving into a new apartment and has provided all required payments but needs assistance with the light bill.  Patient requested CSW contact the [employer] on his behalf for assistance.  Patient has been to the [employer] on multiple occasions and was told they were out of funds.  CSW contacted [employer] and confirmed they were out of funds.  They did inform CSW that they were expecting to regain funding tomorrow, but would not have information until later in the afternoon.  Art therapist and CSW agreed that CSW would contact [employer] afternoon on behalf of patient.  If funds were available then they would be able to assist patient.  CSw contacted patient to inform him of the plan and information he would need to bring with him to the [facility] (proof of physical address, [employer] account, ID, income info., and social security number).  Once patient provides information [employer] will contact [employer] with commitment.  Patient verbalized understanding of the plan.  CSW will follow up with [employer] and patient.   Johnnye Lana, MSW, LCSW, OSW-C Clinical Social Worker [facility] [phone]

## 2015-03-17 NOTE — Telephone Encounter (Signed)
Called and left another message for Dagen asking when he is going to come in for radiation treatment.  Requested a return call.

## 2015-03-18 ENCOUNTER — Ambulatory Visit: Payer: Medicare Other

## 2015-03-18 ENCOUNTER — Telehealth: Payer: Self-pay | Admitting: Oncology

## 2015-03-18 ENCOUNTER — Ambulatory Visit
Admission: RE | Admit: 2015-03-18 | Discharge: 2015-03-18 | Disposition: A | Discharge status: Home / Self Care | Payer: Medicare Other | Source: Ambulatory Visit | Attending: Radiation Oncology | Admitting: Radiation Oncology

## 2015-03-18 NOTE — Telephone Encounter (Signed)
Frank Ward called and said he would not be able to come to radiation treatments for the rest of this week.  He said that he can't afford transportation to come back and forth.  He said he would like to be admitted on Monday for pain control and complete radiation at that time.  Asked him if he has pain medication and Frank Ward said that he did.  Advised him to call to let us know if he will be coming in on Monday.  Frank Ward verbalized agreement.  Notified Raquel Sarna on linac 3 that patient has canceled for the rest of the week.

## 2015-03-18 NOTE — Telephone Encounter (Signed)
Left another message for Frank Ward asking when he will be coming in for radiation treatment.  Requested a return call.

## 2015-03-19 ENCOUNTER — Ambulatory Visit: Admission: RE | Admit: 2015-03-19 | Payer: Medicare Other | Source: Ambulatory Visit

## 2015-03-20 ENCOUNTER — Ambulatory Visit: Payer: Medicare Other

## 2015-03-23 ENCOUNTER — Telehealth: Payer: Self-pay | Admitting: Oncology

## 2015-03-23 ENCOUNTER — Ambulatory Visit: Payer: Medicare Other

## 2015-03-23 NOTE — Telephone Encounter (Signed)
Left a message for Frank Ward asking if he will be able to keep his radiation appointment today.  Requested a return call.

## 2015-03-24 ENCOUNTER — Telehealth: Payer: Self-pay | Admitting: Radiation Oncology

## 2015-03-24 ENCOUNTER — Ambulatory Visit: Payer: Medicare Other

## 2015-03-24 NOTE — Telephone Encounter (Signed)
Phoned patient after he didn't show for treatment appointment today. Patient reports he is moving and unable to attend any treatment appointments this week. Patient request to start treatment next week. Santiago Glad, RN informed. Routed message to Dr. Sondra Come.

## 2015-03-25 ENCOUNTER — Ambulatory Visit: Payer: Medicare Other

## 2015-03-26 ENCOUNTER — Ambulatory Visit: Payer: Medicare Other

## 2015-03-27 ENCOUNTER — Ambulatory Visit: Payer: Medicare Other

## 2015-03-29 NOTE — Progress Notes (Signed)
  Radiation Oncology         [phone] ________________________________  Name: Frank Ward MRN: [id number]  Date: 03/05/2015  DOB: 06-Mar-1971  End of Treatment Note   ICD-9-CM ICD-10-CM    1. 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   He was admitted for the majority of his radiation treatment. His pain seemed to have improved slightly. He was complaining of a new area in his left clavicle area at the end of treatment and may require treatments to this area at a later date  Plan: The patient has completed radiation treatment. The patient will return to radiation oncology clinic for routine followup in one month. I advised them to call or return sooner if they have any questions or concerns related to their recovery or treatment.  -----------------------------------  Blair Promise, PhD, MD

## 2015-03-30 ENCOUNTER — Ambulatory Visit: Payer: Medicare Other

## 2015-03-31 ENCOUNTER — Ambulatory Visit: Payer: Medicare Other

## 2015-04-01 ENCOUNTER — Ambulatory Visit: Payer: Medicare Other

## 2015-04-01 ENCOUNTER — Inpatient Hospital Stay (HOSPITAL_COMMUNITY)
Admission: EM | Admit: 2015-04-01 | Discharge: 2015-04-23 | DRG: 542 | Disposition: A | Discharge status: Skilled Nursing Facility | Payer: Medicare Other | Attending: Internal Medicine | Admitting: Internal Medicine

## 2015-04-01 ENCOUNTER — Ambulatory Visit
Admit: 2015-04-01 | Discharge: 2015-04-01 | Disposition: A | Discharge status: Home / Self Care | Payer: Medicare Other | Attending: Radiation Oncology | Admitting: Radiation Oncology

## 2015-04-01 ENCOUNTER — Emergency Department (HOSPITAL_COMMUNITY): Payer: Medicare Other

## 2015-04-01 ENCOUNTER — Encounter (HOSPITAL_COMMUNITY): Payer: Self-pay

## 2015-04-01 DIAGNOSIS — H9311 Tinnitus, right ear: Secondary | ICD-10-CM | POA: Diagnosis present

## 2015-04-01 DIAGNOSIS — H532 Diplopia: Secondary | ICD-10-CM | POA: Diagnosis present

## 2015-04-01 DIAGNOSIS — R946 Abnormal results of thyroid function studies: Secondary | ICD-10-CM | POA: Diagnosis present

## 2015-04-01 DIAGNOSIS — Z87891 Personal history of nicotine dependence: Secondary | ICD-10-CM | POA: Diagnosis not present

## 2015-04-01 DIAGNOSIS — Z791 Long term (current) use of non-steroidal anti-inflammatories (NSAID): Secondary | ICD-10-CM | POA: Diagnosis not present

## 2015-04-01 DIAGNOSIS — M779 Enthesopathy, unspecified: Secondary | ICD-10-CM | POA: Diagnosis present

## 2015-04-01 DIAGNOSIS — Z79891 Long term (current) use of opiate analgesic: Secondary | ICD-10-CM | POA: Diagnosis not present

## 2015-04-01 DIAGNOSIS — C801 Malignant (primary) neoplasm, unspecified: Secondary | ICD-10-CM

## 2015-04-01 DIAGNOSIS — G893 Neoplasm related pain (acute) (chronic): Secondary | ICD-10-CM | POA: Diagnosis present

## 2015-04-01 DIAGNOSIS — R52 Pain, unspecified: Secondary | ICD-10-CM | POA: Diagnosis present

## 2015-04-01 DIAGNOSIS — Z9119 Patient's noncompliance with other medical treatment and regimen: Secondary | ICD-10-CM | POA: Diagnosis present

## 2015-04-01 DIAGNOSIS — W06XXXA Fall from bed, initial encounter: Secondary | ICD-10-CM | POA: Diagnosis not present

## 2015-04-01 DIAGNOSIS — C7951 Secondary malignant neoplasm of bone: Secondary | ICD-10-CM | POA: Diagnosis present

## 2015-04-01 DIAGNOSIS — Y92230 Patient room in hospital as the place of occurrence of the external cause: Secondary | ICD-10-CM | POA: Diagnosis not present

## 2015-04-01 DIAGNOSIS — Z91199 Patient's noncompliance with other medical treatment and regimen due to unspecified reason: Secondary | ICD-10-CM

## 2015-04-01 DIAGNOSIS — Z91040 Latex allergy status: Secondary | ICD-10-CM | POA: Diagnosis not present

## 2015-04-01 DIAGNOSIS — E890 Postprocedural hypothyroidism: Secondary | ICD-10-CM | POA: Diagnosis present

## 2015-04-01 DIAGNOSIS — Z923 Personal history of irradiation: Secondary | ICD-10-CM

## 2015-04-01 DIAGNOSIS — M8458XA Pathological fracture in neoplastic disease, other specified site, initial encounter for fracture: Secondary | ICD-10-CM | POA: Diagnosis present

## 2015-04-01 DIAGNOSIS — Z833 Family history of diabetes mellitus: Secondary | ICD-10-CM | POA: Diagnosis not present

## 2015-04-01 DIAGNOSIS — F419 Anxiety disorder, unspecified: Secondary | ICD-10-CM | POA: Diagnosis present

## 2015-04-01 DIAGNOSIS — Z880 Allergy status to penicillin: Secondary | ICD-10-CM | POA: Diagnosis not present

## 2015-04-01 DIAGNOSIS — I10 Essential (primary) hypertension: Secondary | ICD-10-CM | POA: Diagnosis present

## 2015-04-01 DIAGNOSIS — C73 Malignant neoplasm of thyroid gland: Secondary | ICD-10-CM | POA: Diagnosis present

## 2015-04-01 DIAGNOSIS — G936 Cerebral edema: Secondary | ICD-10-CM | POA: Diagnosis present

## 2015-04-01 DIAGNOSIS — D630 Anemia in neoplastic disease: Secondary | ICD-10-CM | POA: Diagnosis present

## 2015-04-01 DIAGNOSIS — M5410 Radiculopathy, site unspecified: Secondary | ICD-10-CM | POA: Diagnosis present

## 2015-04-01 DIAGNOSIS — C7931 Secondary malignant neoplasm of brain: Secondary | ICD-10-CM | POA: Diagnosis present

## 2015-04-01 DIAGNOSIS — Z888 Allergy status to other drugs, medicaments and biological substances status: Secondary | ICD-10-CM | POA: Diagnosis not present

## 2015-04-01 DIAGNOSIS — C7952 Secondary malignant neoplasm of bone marrow: Secondary | ICD-10-CM

## 2015-04-01 DIAGNOSIS — F4323 Adjustment disorder with mixed anxiety and depressed mood: Secondary | ICD-10-CM | POA: Diagnosis present

## 2015-04-01 DIAGNOSIS — R64 Cachexia: Secondary | ICD-10-CM | POA: Diagnosis present

## 2015-04-01 DIAGNOSIS — Z681 Body mass index (BMI) 19 or less, adult: Secondary | ICD-10-CM

## 2015-04-01 DIAGNOSIS — Z7952 Long term (current) use of systemic steroids: Secondary | ICD-10-CM | POA: Diagnosis not present

## 2015-04-01 DIAGNOSIS — K5909 Other constipation: Secondary | ICD-10-CM | POA: Diagnosis present

## 2015-04-01 DIAGNOSIS — K5900 Constipation, unspecified: Secondary | ICD-10-CM | POA: Diagnosis present

## 2015-04-01 DIAGNOSIS — C7800 Secondary malignant neoplasm of unspecified lung: Secondary | ICD-10-CM | POA: Diagnosis present

## 2015-04-01 DIAGNOSIS — R042 Hemoptysis: Secondary | ICD-10-CM | POA: Diagnosis present

## 2015-04-01 DIAGNOSIS — T402X5A Adverse effect of other opioids, initial encounter: Secondary | ICD-10-CM | POA: Diagnosis present

## 2015-04-01 DIAGNOSIS — Z79899 Other long term (current) drug therapy: Secondary | ICD-10-CM | POA: Diagnosis not present

## 2015-04-01 DIAGNOSIS — E43 Unspecified severe protein-calorie malnutrition: Secondary | ICD-10-CM | POA: Diagnosis present

## 2015-04-01 DIAGNOSIS — Z515 Encounter for palliative care: Secondary | ICD-10-CM | POA: Diagnosis not present

## 2015-04-01 DIAGNOSIS — W19XXXA Unspecified fall, initial encounter: Secondary | ICD-10-CM

## 2015-04-01 LAB — CBC WITH DIFFERENTIAL/PLATELET
Basophils Absolute: 0 10*3/uL (ref 0.0–0.1)
Basophils Relative: 0 % (ref 0–1)
Eosinophils Absolute: 0 10*3/uL (ref 0.0–0.7)
Eosinophils Relative: 0 % (ref 0–5)
HEMATOCRIT: 29.5 % — AB (ref 39.0–52.0)
Hemoglobin: 9.3 g/dL — ABNORMAL LOW (ref 13.0–17.0)
Lymphocytes Relative: 6 % — ABNORMAL LOW (ref 12–46)
Lymphs Abs: 0.4 10*3/uL — ABNORMAL LOW (ref 0.7–4.0)
MCH: 28.6 pg (ref 26.0–34.0)
MCHC: 31.5 g/dL (ref 30.0–36.0)
MCV: 90.8 fL (ref 78.0–100.0)
MONO ABS: 0.5 10*3/uL (ref 0.1–1.0)
MONOS PCT: 8 % (ref 3–12)
Neutro Abs: 5.1 10*3/uL (ref 1.7–7.7)
Neutrophils Relative %: 86 % — ABNORMAL HIGH (ref 43–77)
Platelets: 311 10*3/uL (ref 150–400)
RBC: 3.25 MIL/uL — ABNORMAL LOW (ref 4.22–5.81)
RDW: 16.6 % — ABNORMAL HIGH (ref 11.5–15.5)
WBC: 6 10*3/uL (ref 4.0–10.5)

## 2015-04-01 LAB — COMPREHENSIVE METABOLIC PANEL
ALBUMIN: 3.3 g/dL — AB (ref 3.5–5.0)
ALK PHOS: 123 U/L (ref 38–126)
ALT: 58 U/L (ref 17–63)
ANION GAP: 6 (ref 5–15)
AST: 26 U/L (ref 15–41)
BUN: 8 mg/dL (ref 6–20)
CALCIUM: 9 mg/dL (ref 8.9–10.3)
CHLORIDE: 104 mmol/L (ref 101–111)
CO2: 27 mmol/L (ref 22–32)
Creatinine, Ser: 0.62 mg/dL (ref 0.61–1.24)
GFR calc Af Amer: >60 mL/min (ref >60)
GFR calc non Af Amer: >60 mL/min (ref >60)
GLUCOSE: 121 mg/dL — AB (ref 65–99)
Potassium: 3.8 mmol/L (ref 3.5–5.1)
SODIUM: 137 mmol/L (ref 135–145)
Total Bilirubin: 0.8 mg/dL (ref 0.3–1.2)
Total Protein: 6.5 g/dL (ref 6.5–8.1)

## 2015-04-01 MED ORDER — DOCUSATE SODIUM 100 MG PO CAPS
100.0000 mg | ORAL_CAPSULE | Freq: Two times a day (BID) | ORAL | Status: DC | PRN
  Administered 2015-04-01 – 2015-04-03 (×2): 100 mg via ORAL
  Filled 2015-04-01 (×4): qty 1

## 2015-04-01 MED ORDER — MORPHINE SULFATE ER 30 MG PO TBCR
60.0000 mg | EXTENDED_RELEASE_TABLET | Freq: Two times a day (BID) | ORAL | Status: DC
  Administered 2015-04-01 – 2015-04-23 (×17): 60 mg via ORAL
  Filled 2015-04-01 (×26): qty 2

## 2015-04-01 MED ORDER — HYDROMORPHONE HCL 1 MG/ML IJ SOLN
1.0000 mg | INTRAMUSCULAR | Status: DC | PRN
  Administered 2015-04-01: 1 mg via INTRAVENOUS
  Filled 2015-04-01: qty 1

## 2015-04-01 MED ORDER — ONDANSETRON HCL 4 MG PO TABS
4.0000 mg | ORAL_TABLET | Freq: Four times a day (QID) | ORAL | Status: DC | PRN
Start: 2015-04-01 — End: 2015-04-23

## 2015-04-01 MED ORDER — QUETIAPINE FUMARATE ER 50 MG PO TB24
100.0000 mg | ORAL_TABLET | Freq: Every day | ORAL | Status: DC
  Administered 2015-04-01 – 2015-04-02 (×2): 50 mg via ORAL
  Administered 2015-04-17 – 2015-04-23 (×6): 100 mg via ORAL
  Filled 2015-04-01 (×27): qty 2

## 2015-04-01 MED ORDER — HYDROMORPHONE HCL 2 MG/ML IJ SOLN
2.0000 mg | INTRAMUSCULAR | Status: DC | PRN
  Administered 2015-04-01 – 2015-04-04 (×13): 2 mg via INTRAVENOUS
  Administered 2015-04-04: 3 mg via INTRAVENOUS
  Administered 2015-04-04 – 2015-04-05 (×11): 2 mg via INTRAVENOUS
  Administered 2015-04-05: 3 mg via INTRAVENOUS
  Administered 2015-04-05 – 2015-04-07 (×26): 2 mg via INTRAVENOUS
  Administered 2015-04-07: 3 mg via INTRAVENOUS
  Administered 2015-04-08 (×4): 2 mg via INTRAVENOUS
  Administered 2015-04-08 (×2): 3 mg via INTRAVENOUS
  Administered 2015-04-08 (×2): 2 mg via INTRAVENOUS
  Administered 2015-04-08 (×3): 3 mg via INTRAVENOUS
  Administered 2015-04-09 – 2015-04-11 (×25): 2 mg via INTRAVENOUS
  Administered 2015-04-11: 1 mg via INTRAVENOUS
  Administered 2015-04-11 (×4): 2 mg via INTRAVENOUS
  Administered 2015-04-12 (×6): 3 mg via INTRAVENOUS
  Filled 2015-04-01 (×2): qty 1
  Filled 2015-04-01: qty 2
  Filled 2015-04-01 (×9): qty 1
  Filled 2015-04-01: qty 2
  Filled 2015-04-01 (×9): qty 1
  Filled 2015-04-01: qty 2
  Filled 2015-04-01 (×11): qty 1
  Filled 2015-04-01 (×2): qty 2
  Filled 2015-04-01 (×3): qty 1
  Filled 2015-04-01: qty 2
  Filled 2015-04-01 (×9): qty 1
  Filled 2015-04-01: qty 2
  Filled 2015-04-01 (×8): qty 1
  Filled 2015-04-01: qty 2
  Filled 2015-04-01: qty 1
  Filled 2015-04-01: qty 2
  Filled 2015-04-01 (×17): qty 1
  Filled 2015-04-01: qty 2
  Filled 2015-04-01 (×4): qty 1
  Filled 2015-04-01: qty 2
  Filled 2015-04-01 (×2): qty 1
  Filled 2015-04-01: qty 2
  Filled 2015-04-01: qty 1
  Filled 2015-04-01: qty 2
  Filled 2015-04-01 (×4): qty 1
  Filled 2015-04-01: qty 2
  Filled 2015-04-01 (×8): qty 1
  Filled 2015-04-01: qty 2

## 2015-04-01 MED ORDER — TRAZODONE HCL 50 MG PO TABS
100.0000 mg | ORAL_TABLET | Freq: Every evening | ORAL | Status: DC | PRN
  Administered 2015-04-16 – 2015-04-21 (×5): 100 mg via ORAL
  Filled 2015-04-01 (×6): qty 2

## 2015-04-01 MED ORDER — IBUPROFEN 200 MG PO TABS
200.0000 mg | ORAL_TABLET | Freq: Four times a day (QID) | ORAL | Status: DC | PRN
  Administered 2015-04-03 – 2015-04-04 (×2): 400 mg via ORAL
  Filled 2015-04-01 (×2): qty 2

## 2015-04-01 MED ORDER — ONDANSETRON HCL 4 MG/2ML IJ SOLN: 4.0000 mg | Freq: Four times a day (QID) | INTRAMUSCULAR | Status: DC | PRN

## 2015-04-01 MED ORDER — ENOXAPARIN SODIUM 40 MG/0.4ML ~~LOC~~ SOLN
40.0000 mg | SUBCUTANEOUS | Status: DC
  Filled 2015-04-01 (×13): qty 0.4

## 2015-04-01 MED ORDER — HYDROMORPHONE HCL 2 MG/ML IJ SOLN
4.0000 mg | Freq: Once | INTRAMUSCULAR | Status: AC
  Administered 2015-04-01: 4 mg via INTRAMUSCULAR
  Filled 2015-04-01: qty 2

## 2015-04-01 MED ORDER — LORAZEPAM 1 MG PO TABS
1.0000 mg | ORAL_TABLET | Freq: Three times a day (TID) | ORAL | Status: DC | PRN
  Administered 2015-04-03 – 2015-04-23 (×5): 1 mg via ORAL
  Filled 2015-04-01 (×5): qty 1

## 2015-04-01 MED ORDER — LEVOTHYROXINE SODIUM 125 MCG PO TABS
125.0000 ug | ORAL_TABLET | Freq: Every day | ORAL | Status: DC
  Administered 2015-04-02 – 2015-04-08 (×6): 125 ug via ORAL
  Filled 2015-04-01 (×10): qty 1

## 2015-04-01 NOTE — ED Notes (Signed)
Hospitalist is with pt. As I write this--will transport when finished.

## 2015-04-01 NOTE — ED Notes (Signed)
He remains awake and in no distress.  He is taken by two staffers for his scheduled radiation oncology treatment.

## 2015-04-01 NOTE — ED Notes (Signed)
He c/o pain left shoulder/clavicle area since yesterday.  He cites "picked up a 5-gallon bucket 2 days ago--that might have something to do with it".  He also tells me he has hx of thyroid cancer with mets to bone.  He has hx of irradiation to thyroid/neck area and, most recently has had irradiation treatments to pelvis area.  His left clavicle where it joins the scapula is in appearance more protuberant than right, and is quite tender to even light palpation.  He further tells [place] he has known cervical vertebral bony mets.  CMS intact all fingers bilat.

## 2015-04-01 NOTE — ED Notes (Signed)
As I write this he remains in radiation oncology.

## 2015-04-01 NOTE — ED Notes (Signed)
Report given to [facility], Therapist, sports and will transport shortly.

## 2015-04-01 NOTE — ED Provider Notes (Signed)
CSN: [id number]     Arrival date & time 04/01/15  1226 History   First MD Initiated Contact with Patient 04/01/15 1443     Chief Complaint  Patient presents with  . Shoulder Pain     (Consider location/radiation/quality/duration/timing/severity/associated sxs/prior Treatment) HPI Patient reports that he has known thyroid cancer. He also reports that he has metastases to his bones. He reports that his pain was much worse this morning when he woke up. He had a lot of pain in the collarbone on the left and into his left shoulder. He thinks he may have done something about 2 days ago he was trying to pick up a couple of pain buckets that were 5 gallons each. He reports that he is due for a radiation treatment today at 445. He denies he develops any fever. He reports as of yesterday he did not have any new symptoms. Past Medical History  Diagnosis Date  . Cancer     Thyroid  . Thyroid disease   . Hypertension   . Pneumonia   . Hx of radiation therapy 02/07/14- 02/21/14    T12-L2, sacrum 3000 cGy 10 sessions  . Hx of radiation therapy 09/04/13-09/24/13    whole brain, C7-T3, upper C spine, right scapulae  . Hx of radiation therapy 06/10/14    left ischium 1 fx 800 cGy  . Allergy   . 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 Tendon repair    . Thyroidectomy     Family History  Problem Relation Age of Onset  . Diabetes Mother   . Diabetes Brother    Social History  Substance Use Topics  . Smoking status: Former Smoker -- 10 years    Types: Cigarettes    Quit date: 08/29/2011  . Smokeless tobacco: Never Used  . Alcohol Use: 3.0 oz/week    5 Cans of beer per week     Comment: occ    Review of Systems 10 Systems reviewed and are negative for acute change except as noted in the HPI.    Allergies  Penicillins; Latex; and Lyrica  Home Medications   Prior to Admission medications   Medication Sig Start Date End  Date Taking? Authorizing Provider  dexamethasone (DECADRON) 4 MG tablet Take 1 tablet (4 mg total) by mouth every morning. 01/27/15  Yes Chauncey Cruel, MD  docusate sodium (COLACE) 100 MG capsule Take 1 capsule (100 mg total) by mouth 2 (two) times daily as needed for mild constipation. 07/02/14  Yes Laurie Panda, NP  furosemide (LASIX) 20 MG tablet Take 1 tablet (20 mg total) by mouth daily. 02/26/15  Yes Laurie Panda, NP  HYDROmorphone (DILAUDID) 4 MG tablet Take 1 tablet (4 mg total) by mouth every 4 (four) hours as needed. Patient taking differently: Take 4 mg by mouth every 4 (four) hours as needed for moderate pain or severe pain.  02/26/15  Yes Laurie Panda, NP  ibuprofen (ADVIL,MOTRIN) 200 MG tablet Take 200-400 mg by mouth every 6 (six) hours as needed for mild pain.   Yes Historical Provider, MD  levothyroxine (SYNTHROID, LEVOTHROID) 125 MCG tablet Take 1 tablet (125 mcg total) by mouth daily before breakfast. 01/27/15  Yes Chauncey Cruel, MD  LORazepam (ATIVAN) 1 MG tablet Take 1 tablet (1 mg total) by mouth every 8 (eight) hours as needed for anxiety. 01/27/15  Yes Chauncey Cruel, MD  morphine (MS CONTIN)  60 MG 12 hr tablet Take 1 tablet (60 mg total) by mouth every 12 (twelve) hours. 02/26/15  Yes Laurie Panda, NP  QUEtiapine (SEROQUEL XR) 50 MG TB24 24 hr tablet Take 2 tablets (100 mg total) by mouth daily. 02/22/14  Yes Robbie Lis, MD  traZODone (DESYREL) 100 MG tablet Take 1 tablet (100 mg total) by mouth at bedtime as needed for sleep. 01/27/15  Yes Virgie Dad Magrinat, MD   BP 107/70 mmHg  Pulse 95  Temp(Src) 98 F (36.7 C) (Oral)  Resp 18  Ht 6' 4.5" (1.943 m)  SpO2 98% Physical Exam  Constitutional: He is oriented to person, place, and time.  Patient is a very thin tall male. He is nontoxic alert. He does appear to be in severe pain. He has no respiratory distress.  HENT:  Head: Normocephalic and atraumatic.  Eyes: EOM are normal.  Neck: Neck  supple.  He endorses pain to palpation to the vertebral bodies of the cervical spine. Anterior neck however does not have any gross fluctuance or massive swelling or erythema. There is no stridor present his voice is clear.  Cardiovascular: Normal rate, regular rhythm, normal heart sounds and intact distal pulses.   Pulmonary/Chest: Effort normal and breath sounds normal.  Abdominal: Soft. He exhibits no distension. There is no tenderness.  Musculoskeletal: Normal range of motion. He exhibits no edema.  Patient generally endorses tenderness to palpation of the bony prominences of the left shoulder, left clavicle, left scapula. The arm itself does not show any edema. The radial pulses are intact.  Neurological: He is alert and oriented to person, place, and time. He has normal strength. Coordination normal. GCS eye subscore is 4. GCS verbal subscore is 5. GCS motor subscore is 6.  Skin: Skin is warm, dry and intact.  Psychiatric: He has a normal mood and affect.    ED Course  Procedures (including critical care time) Labs Review Labs Reviewed  COMPREHENSIVE METABOLIC PANEL - Abnormal; Notable for the following:    Glucose, Bld 121 (*)    Albumin 3.3 (*)    All other components within normal limits  CBC WITH DIFFERENTIAL/PLATELET - Abnormal; Notable for the following:    RBC 3.25 (*)    Hemoglobin 9.3 (*)    HCT 29.5 (*)    RDW 16.6 (*)    Neutrophils Relative % 86 (*)    Lymphocytes Relative 6 (*)    Lymphs Abs 0.4 (*)    All other components within normal limits  BASIC METABOLIC PANEL - Abnormal; Notable for the following:    Glucose, Bld 117 (*)    Calcium 8.7 (*)    All other components within normal limits  CBC - Abnormal; Notable for the following:    RBC 3.26 (*)    Hemoglobin 9.2 (*)    HCT 29.9 (*)    RDW 16.6 (*)    All other components within normal limits    Imaging Review Dg Chest Port 1 View  04/01/2015   CLINICAL DATA:  Left clavicle and shoulder pain. Thyroid  cancer with metastatic bone disease.  EXAM: PORTABLE CHEST - 1 VIEW  COMPARISON:  CT chest 12/24/2014.  FINDINGS: The heart size is normal. Multiple bilateral pulmonary nodules are present bilaterally compatible with known metastatic disease. There has been some progression since the prior study. No focal airspace consolidation is evident otherwise.  Rightward curvature is present in the lower thoracic spine due to collapse pathologic left-sided fracture, unchanged. An  erosive lesion is again noted in the right scapula. No definite new lesions are evident.  IMPRESSION: 1. Progression of numerous pulmonary nodules compatible with known metastatic disease. 2. Rightward curvature of the thoracic spine is secondary to asymmetric left-sided pathologic fractures, unchanged. 3. Stable erosive lesion in the right scapula.   Electronically Signed   By: San Morelle M.D.   On: 04/01/2015 16:19   I have personally reviewed and evaluated these images and lab results as part of my medical decision-making.   EKG Interpretation None     Consult: Dr. Vanessa Kick at this time will arrange continued management with Dr.Magrinat. Patient has had significant noncompliance and missed appointments. They will attempt again to coordinate his care. Consult: Dr. Earlie Counts, advises to discuss with radiation oncology to see if inpatient Radiation would be done. At this time with patient taking PO and having home meds, no current criteria for admission. MDM   Final diagnoses:  Intractable pain  Cancer   The patient will be admitted for inpatient radiation therapy and pain management.    Charlesetta Shanks, MD 04/02/15 [phone]

## 2015-04-01 NOTE — Progress Notes (Signed)
CM noted CM consult for Pt states he is interested in getting some home health. 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 Please follow up with pt ED CM spoke with pt who states his case worker Ms Abby Potash who works for [facility] will be helping him to get someone to stay with him for a few hours to do some house keeping Pt states he does not want someone to stay 24 hrs in his 2 bed room apartment Pt has medicare and medicaid [place] access coverage  CM reviewed in details medicare guidelines, home health ([place]) (length of stay in home, types of [facility] staff available, coverage, primary caregiver, up to 24 hrs before services may be started) and Private duty nursing (PDN-coverage, length of stay in the home types of staff available). CM provided pt with a list of [place] PDN.  CM encouraged pt to call an agency on PDN list and explain to them what his needs are  Placed resources in a pt belonging bag for pt

## 2015-04-01 NOTE — H&P (Signed)
[facility] History and Physical  Frank Ward NIO:[id number] DOB: October 14, 1970 DOA: 04/01/2015  Referring physician: ED physician, Dr. 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 Pt explains that pain is throbbing and constant, 10/10 in severity and with no specific alleviating or aggravating factors, occasionally but not consistently radiating to the left arm area, [name], pt is hemodynamically stable but in pain. VSS. TRH asked to admit for further management and radiation oncology will see in consultation for radiation treatment.   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 Thyroid cancer with metastatic bone disease.  EXAM: PORTABLE CHEST - 1 VIEW  COMPARISON:  CT chest 12/24/2014.  FINDINGS: The heart size is normal. Multiple bilateral pulmonary nodules are present bilaterally compatible with known metastatic  disease. There has been some progression since the prior study. No focal airspace consolidation is evident otherwise.  Rightward curvature is present in the lower thoracic spine due to collapse pathologic left-sided fracture, unchanged. An erosive lesion is again noted in the right scapula. No definite new lesions are evident.  IMPRESSION: 1. Progression of numerous pulmonary nodules compatible with known metastatic disease. 2. Rightward curvature of the thoracic spine is secondary to asymmetric left-sided pathologic fractures, unchanged. 3. Stable erosive lesion in the right scapula.   Electronically Signed   By: San Morelle M.D.   On: 04/01/2015 16:19     Code Status: Full Family Communication: Pt at bedside Disposition Plan: Admit for further evaluation    Mart Piggs [facility] [phone]   Review of Systems:  Constitutional: Negative for fever, chills and malaise/fatigue. Negative for diaphoresis.  HENT: Negative for hearing loss, ear pain, nosebleeds, congestion, sore throat, neck pain, tinnitus and ear discharge.   Eyes: Negative for blurred vision, double vision, photophobia, pain, discharge and redness.  Respiratory: Negative for cough, hemoptysis, sputum production, shortness of breath, wheezing and stridor.   Cardiovascular: Negative for chest pain, palpitations, orthopnea, claudication and leg swelling.  Gastrointestinal: Negative for nausea, vomiting and abdominal pain.  Genitourinary: Negative for dysuria, urgency, frequency, hematuria and flank pain.  Musculoskeletal: Negative for myalgias, back pain, joint pain and falls.  Skin: Negative for itching and rash.  Neurological: Negative for dizziness and weakness.  Endo/Heme/Allergies: Negative for environmental allergies and polydipsia. Does not bruise/bleed easily.  Psychiatric/Behavioral: Negative for suicidal ideas. The patient is not nervous/anxious.  Past Medical History  Diagnosis Date  . Cancer     Thyroid  .  Thyroid disease   . Hypertension   . Pneumonia   . Hx of radiation therapy 02/07/14- 02/21/14    T12-L2, sacrum 3000 cGy 10 sessions  . Hx of radiation therapy 09/04/13-09/24/13    whole brain, C7-T3, upper C spine, right scapulae  . Hx of radiation therapy 06/10/14    left ischium 1 fx 800 cGy  . Allergy   . Bone cancer     sacrum,T12-L2,c-spine,lt ischium    Past Surgical History  Procedure Laterality Date  . Tendon repair    . Thyroidectomy      Social History:  reports that he quit smoking about 3 years ago. His smoking use included Cigarettes. He quit after 10 years of use. He has never used smokeless tobacco. He reports that he drinks about 3.0 oz of alcohol per week. He reports that he uses illicit drugs (Marijuana).  Allergies  Allergen Reactions  . Penicillins Anaphylaxis, Hives, Itching and Other (See Comments)    Pt reports he does not believe this is true, states he does not recall ever being allergic to penicillin   . Latex Hives and Itching  . Lyrica [Pregabalin] Swelling    Family History  Problem Relation Age of Onset  . Diabetes Mother   . Diabetes Brother     Prior to Admission medications   Medication Sig Start Date End Date Taking? Authorizing Provider  dexamethasone (DECADRON) 4 MG tablet Take 1 tablet (4 mg total) by mouth every morning. 01/27/15  Yes Chauncey Cruel, MD  docusate sodium (COLACE) 100 MG capsule Take 1 capsule (100 mg total) by mouth 2 (two) times daily as needed for mild constipation. 07/02/14  Yes Laurie Panda, NP  furosemide (LASIX) 20 MG tablet Take 1 tablet (20 mg total) by mouth daily. 02/26/15  Yes Laurie Panda, NP  HYDROmorphone (DILAUDID) 4 MG tablet Take 1 tablet (4 mg total) by mouth every 4 (four) hours as needed. Patient taking differently: Take 4 mg by mouth every 4 (four) hours as needed for moderate pain or severe pain.  02/26/15  Yes Laurie Panda, NP  ibuprofen (ADVIL,MOTRIN) 200 MG tablet Take 200-400 mg by  mouth every 6 (six) hours as needed for mild pain.   Yes Historical Provider, MD  levothyroxine (SYNTHROID, LEVOTHROID) 125 MCG tablet Take 1 tablet (125 mcg total) by mouth daily before breakfast. 01/27/15  Yes Chauncey Cruel, MD  LORazepam (ATIVAN) 1 MG tablet Take 1 tablet (1 mg total) by mouth every 8 (eight) hours as needed for anxiety. 01/27/15  Yes Chauncey Cruel, MD  morphine (MS CONTIN) 60 MG 12 hr tablet Take 1 tablet (60 mg total) by mouth every 12 (twelve) hours. 02/26/15  Yes Laurie Panda, NP  QUEtiapine (SEROQUEL XR) 50 MG TB24 24 hr tablet Take 2 tablets (100 mg total) by mouth daily. 02/22/14  Yes Robbie Lis, MD  traZODone (DESYREL) 100 MG tablet Take 1 tablet (100 mg total) by mouth at bedtime as needed for sleep. 01/27/15  Yes Chauncey Cruel, MD    Physical Exam: Filed Vitals:   04/01/15 1237 04/01/15 1607  BP: 124/76 104/67  Pulse: 76 88  Resp: 18 16  SpO2: 100% 96%    Physical Exam  Constitutional: Appears well-developed and well-nourished. No distress.  HENT: Normocephalic. External right and left ear normal. Dry MM Eyes: Conjunctivae and EOM are normal.  PERRLA, no scleral icterus.  Neck: Normal ROM. Neck supple. No JVD. Area under the left clavicle with swelling and TTP CVS: RRR, [name] +, no murmurs, no gallops, no carotid bruit.  Pulmonary: Effort and breath sounds normal, diminished breath sounds at bases  Abdominal: Soft. BS +,  no distension, tenderness, rebound or guarding.  Musculoskeletal: Normal range of motion. No edema and no tenderness.  Lymphadenopathy: No cervical, inguinal adenopathy, Neuro: Alert. Normal reflexes, muscle tone coordination. No cranial nerve deficit. Skin: Skin is warm and dry. No rash noted. Not diaphoretic. No erythema. No pallor.  Psychiatric: Normal mood and affect. Behavior, judgment, thought content normal.   Labs on Admission:  Basic Metabolic Panel:  Recent Labs Lab 04/01/15 1615  NA 137  K 3.8  CL 104   CO2 27  GLUCOSE 121*  BUN 8  CREATININE 0.62  CALCIUM 9.0   Liver Function Tests:  Recent Labs Lab 04/01/15 1615  AST 26  ALT 58  ALKPHOS 123  BILITOT 0.8  PROT 6.5  ALBUMIN 3.3*   CBC:  Recent Labs Lab 04/01/15 1615  WBC 6.0  NEUTROABS 5.1  HGB 9.3*  HCT 29.5*  MCV 90.8  PLT 311    EKG: pending    If 7PM-7AM, please contact night-coverage [email or web address] Password [email or web address] 04/01/2015, 5:22 PM

## 2015-04-02 ENCOUNTER — Ambulatory Visit
Admit: 2015-04-02 | Discharge: 2015-04-02 | Disposition: A | Discharge status: Home / Self Care | Payer: Medicare Other | Attending: Radiation Oncology | Admitting: Radiation Oncology

## 2015-04-02 ENCOUNTER — Ambulatory Visit: Payer: Medicare Other

## 2015-04-02 ENCOUNTER — Encounter: Payer: Self-pay | Admitting: *Deleted

## 2015-04-02 ENCOUNTER — Ambulatory Visit: Payer: Medicare Other | Admitting: Radiation Oncology

## 2015-04-02 ENCOUNTER — Telehealth: Payer: Self-pay | Admitting: *Deleted

## 2015-04-02 LAB — CBC
HCT: 29.9 % — ABNORMAL LOW (ref 39.0–52.0)
Hemoglobin: 9.2 g/dL — ABNORMAL LOW (ref 13.0–17.0)
MCH: 28.2 pg (ref 26.0–34.0)
MCHC: 30.8 g/dL (ref 30.0–36.0)
MCV: 91.7 fL (ref 78.0–100.0)
PLATELETS: 286 10*3/uL (ref 150–400)
RBC: 3.26 MIL/uL — AB (ref 4.22–5.81)
RDW: 16.6 % — AB (ref 11.5–15.5)
WBC: 5.2 10*3/uL (ref 4.0–10.5)

## 2015-04-02 LAB — BASIC METABOLIC PANEL
Anion gap: 9 (ref 5–15)
BUN: 11 mg/dL (ref 6–20)
CHLORIDE: 103 mmol/L (ref 101–111)
CO2: 26 mmol/L (ref 22–32)
CREATININE: 0.83 mg/dL (ref 0.61–1.24)
Calcium: 8.7 mg/dL — ABNORMAL LOW (ref 8.9–10.3)
Glucose, Bld: 117 mg/dL — ABNORMAL HIGH (ref 65–99)
POTASSIUM: 4.3 mmol/L (ref 3.5–5.1)
SODIUM: 138 mmol/L (ref 135–145)

## 2015-04-02 MED ORDER — ENSURE ENLIVE PO LIQD
237.0000 mL | Freq: Three times a day (TID) | ORAL | Status: DC
  Administered 2015-04-02 – 2015-04-21 (×10): 237 mL via ORAL

## 2015-04-02 MED ORDER — MUSCLE RUB 10-15 % EX CREA
TOPICAL_CREAM | CUTANEOUS | Status: DC | PRN
  Administered 2015-04-02 – 2015-04-03 (×2): via TOPICAL
  Filled 2015-04-02: qty 85

## 2015-04-02 NOTE — Progress Notes (Signed)
Patient declined to go to Radiation treatment at 0945 and at 1300 today, now he is asking if he can go. It was explained to him that they have already left for the day. Patient stated that he would talk to them tomorrow.

## 2015-04-02 NOTE — Progress Notes (Signed)
[place] Work  Clinical Social Work was referred by patient via voicemail left on 04/01/15. Pt reports he was admitted to the hospital and needs [facility] assistance at home and to please arrange. CSW has spoken to pt several times in the past about the possibility of PCS (CNA services) through medicaid, but he had declined these services to date. CSW also educated pt that these services can take up to 6 weeks to get into place. Pt eager to accomplish goal of securing stable housing and the [facility] CSW team worked with pt to obtain. Clinical Social Worker will attempt to meet with pt later this am and explore needs further .  Pt has HCPOA and ADRs in media tab if needed.   Loren Racer, [place] Worker [place]  [place] Phone: [phone] Fax: [phone]

## 2015-04-02 NOTE — Telephone Encounter (Signed)
Called and 8:30 AM  spoke with RN , please medicate patient for pain by 830 am , we will come for him for ct sim and treat at 99am, RN gave verbal understanding, I believe her name was Jenny Reichmann?

## 2015-04-02 NOTE — Discharge Instructions (Signed)

## 2015-04-02 NOTE — Plan of Care (Signed)
Problem: Phase I Progression Outcomes Goal: OOB as tolerated unless otherwise ordered Outcome: Completed/Met Date Met:  04/02/15 Up as tol in room

## 2015-04-02 NOTE — Progress Notes (Signed)
Patient ID: Frank Ward, male   DOB: 1971/07/01, 44 y.o.   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 Pt explains that pain is throbbing and constant, 10/10 in severity and with no specific alleviating or aggravating factors, occasionally but not consistently radiating to the left arm area, [name], 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  Family Communication:  plan of care discussed with the patient Disposition Plan: Home when stable.   IV access:  Peripheral IV  Procedures and diagnostic studies:    Dg Chest Port 1 View 2015-04-30  Progression of numerous pulmonary nodules compatible with known  metastatic disease. 2. Rightward curvature of the thoracic spine is secondary to asymmetric left-sided pathologic fractures, [name], MD  [facility] Pager [phone]  If 7PM-7AM, please contact night-coverage [email or web address] Password [facility] 04/02/2015, 8:28 AM   LOS: 1 day   HPI/Subjective: No events overnight. Pt reports pain 5/10 in severity, generalized.   Objective: Filed Vitals:   04-30-2015 1237 Apr 30, 2015 1607 04/30/15 1940 04/02/15 0527  BP: 124/76 104/67 109/69 94/59  Pulse: 76 88 89 92  Temp:   98.1 F (36.7 C) 97.7 F (36.5 C)  TempSrc:   Oral Oral  Resp: 18 16 16 16   Height:   6' 4.5" (1.943 m)   SpO2: 100% 96% 100% 100%    Intake/Output Summary (Last 24 hours) at 04/02/15 5053 Last data filed at 04/02/15 9767  Gross per 24 hour  Intake    800 ml  Output      0 ml  Net    800 ml    Exam:   General:  Pt is alert, not in acute distress  Cardiovascular: Regular rate and rhythm, [name], no murmurs, no rubs, no gallops  Respiratory: Clear to auscultation bilaterally, no wheezing, diminished breath sounds at bases  Abdomen: Soft, non tender, non distended, bowel sounds present, no guarding   Data Reviewed: Basic Metabolic Panel:  Recent Labs Lab Apr 30, 2015 1615 04/02/15 0410  NA 137 138  K  3.8 4.3  CL 104 103  CO2 27 26  GLUCOSE 121* 117*  BUN 8 11  CREATININE 0.62 0.83  CALCIUM 9.0 8.7*   Liver Function Tests:  Recent Labs Lab 04/01/15 1615  AST 26  ALT 58  ALKPHOS 123  BILITOT 0.8  PROT 6.5  ALBUMIN 3.3*   CBC:  Recent Labs Lab 04/01/15 1615 04/02/15 0410  WBC 6.0 5.2  NEUTROABS 5.1  --   HGB 9.3* 9.2*  HCT 29.5* 29.9*  MCV 90.8 91.7  PLT 311 286    Scheduled Meds: . enoxaparin (LOVENOX) injection  40 mg Subcutaneous Q24H  . levothyroxine  125 mcg Oral QAC  breakfast  . morphine  60 mg Oral Q12H  . QUEtiapine  100 mg Oral Daily   Continuous Infusions:

## 2015-04-02 NOTE — Progress Notes (Signed)
Initial Nutrition Assessment  DOCUMENTATION CODES:   Severe malnutrition in context of chronic illness, Underweight  INTERVENTION:   Recommend weight be obtained for this admission -Provide Ensure Enlive po TID, each supplement provides 350 kcal and 20 grams of protein -Encourage PO intake -RD to continue to monitor  NUTRITION DIAGNOSIS:   Malnutrition related to cancer and cancer related treatments as evidenced by percent weight loss, severe depletion of body fat, severe depletion of muscle mass.  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 Pt explains that pain is throbbing and constant, 10/10 in severity and with no specific alleviating or aggravating factors, occasionally but not consistently radiating to the left arm area, associated with swelling in that area and hand and arm tingling and numbness.  Pt in room with breakfast tray. Pt preparing to start eating but appeared in pain. Pt states that his appetite has improved and he feels like he has gained weight recently. Pt has not had his weight taken this admission. Last available weight is from 7/19, pt underweight based on this weight.  Pt reports he drinks nutritional supplements when he can afford them. Provided more affordable options that he can drink when he can't buy Ensure/Boost drinks. RD to order Ensure for patient.  Nutrition-Focused physical exam completed. Findings are severe fat depletion, severe muscle depletion, and no edema.   Labs reviewed.  Diet Order:  Diet regular Room service  appropriate?: Yes; Fluid consistency:: Thin  Skin:  Reviewed, no issues  Last BM:  8/15  Height:   Ht Readings from Last 1 Encounters:  04/01/15 6' 4.5" (1.943 m)    Weight:   Wt Readings from Last 1 Encounters:  03/03/15 138 lb 6.4 oz (62.778 kg)    Ideal Body Weight:  94.5 kg  BMI:  There is no weight on file to calculate BMI. BMI from 7/19: 16.4  Estimated Nutritional Needs:   Kcal:  2100-2300  Protein:  100-110g  Fluid:  2.1L/day  EDUCATION NEEDS:   No education needs identified at this time  Clayton Bibles, MS, RD, LDN Pager: [phone] After Hours Pager: [phone]

## 2015-04-03 ENCOUNTER — Encounter: Payer: Self-pay | Admitting: *Deleted

## 2015-04-03 ENCOUNTER — Ambulatory Visit
Admit: 2015-04-03 | Discharge: 2015-04-03 | Disposition: A | Discharge status: Home / Self Care | Payer: Medicare Other | Attending: Radiation Oncology | Admitting: Radiation Oncology

## 2015-04-03 LAB — CBC
HEMATOCRIT: 30.5 % — AB (ref 39.0–52.0)
Hemoglobin: 9.7 g/dL — ABNORMAL LOW (ref 13.0–17.0)
MCH: 29.1 pg (ref 26.0–34.0)
MCHC: 31.8 g/dL (ref 30.0–36.0)
MCV: 91.6 fL (ref 78.0–100.0)
PLATELETS: 349 10*3/uL (ref 150–400)
RBC: 3.33 MIL/uL — ABNORMAL LOW (ref 4.22–5.81)
RDW: 16.4 % — AB (ref 11.5–15.5)
WBC: 5.1 10*3/uL (ref 4.0–10.5)

## 2015-04-03 MED ORDER — SENNOSIDES-DOCUSATE SODIUM 8.6-50 MG PO TABS
1.0000 | ORAL_TABLET | Freq: Two times a day (BID) | ORAL | Status: DC
  Administered 2015-04-04 – 2015-04-23 (×11): 1 via ORAL
  Filled 2015-04-03 (×36): qty 1

## 2015-04-03 MED ORDER — POLYETHYLENE GLYCOL 3350 17 G PO PACK
17.0000 g | PACK | Freq: Every day | ORAL | Status: DC
  Administered 2015-04-06 – 2015-04-08 (×2): 17 g via ORAL
  Filled 2015-04-03 (×6): qty 1

## 2015-04-03 MED ORDER — CALCIUM CARBONATE ANTACID 500 MG PO CHEW
1.0000 | CHEWABLE_TABLET | Freq: Four times a day (QID) | ORAL | Status: DC | PRN
  Administered 2015-04-20 – 2015-04-21 (×2): 200 mg via ORAL
  Filled 2015-04-03 (×2): qty 1

## 2015-04-03 NOTE — Progress Notes (Signed)
PT Cancellation Note  Patient Details Name: Frank Ward MRN: [id number] DOB: Jan 04, 1971   Cancelled Treatment:    Reason Eval/Treat Not Completed: Patient at procedure or test/unavailable (pt leaving room to go to radiation.  Will follow. )   Philomena Doheny 04/03/2015, 12:48 PM

## 2015-04-03 NOTE — Progress Notes (Signed)
Patient ID: Frank Ward, male   DOB: 1971-02-26, 44 y.o.   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 Pt explains that pain is throbbing and constant, 10/10 in severity and with no specific alleviating or aggravating factors, occasionally but not consistently radiating to the left arm area, [name], 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  Family Communication:  plan of care discussed with the patient Disposition Plan: Home  when stable.   IV access:  Peripheral IV  Procedures and diagnostic studies:    Dg Chest Port 1 View 04-12-15  Progression of numerous pulmonary nodules compatible with known metastatic disease. 2. Rightward curvature of the thoracic spine is secondary to asymmetric left-sided pathologic fractures, [name], MD  [facility] Pager [phone]  If 7PM-7AM, please contact night-coverage [email or web address] Password [facility] 04/03/2015, 9:17 AM   LOS: 2 days   HPI/Subjective: No events overnight. Pt reports pain 7/10 in severity, generalized.   Objective: Filed Vitals:   04/02/15 1450 04/02/15 2105 04/02/15 2126 04/03/15 0433  BP: 107/70  109/65 118/72  Pulse: 95 96 105 98  Temp: 98 F (36.7 C)  98.4 F (36.9 C) 98 F (36.7 C)  TempSrc: Oral  Oral Oral  Resp: 18  18 18   Height:      SpO2: 98%  100% 98%    Intake/Output Summary (Last 24 hours) at 04/03/15 8657 Last data filed at 04/03/15 0010  Gross per 24 hour  Intake    720 ml  Output      0 ml  Net    720 ml    Exam:   General:  Pt is alert, not in acute distress  Cardiovascular: Regular rate and rhythm, [name], no murmurs, no rubs, no gallops  Respiratory: Clear to auscultation bilaterally, no wheezing, diminished breath sounds at bases  Abdomen: Soft,  non tender, non distended, bowel sounds present, no guarding   Data Reviewed: Basic Metabolic Panel:  Recent Labs Lab 04/01/15 1615 04/02/15 0410  NA 137 138  K 3.8 4.3  CL 104 103  CO2 27 26  GLUCOSE 121* 117*  BUN 8 11  CREATININE 0.62 0.83  CALCIUM 9.0 8.7*   Liver Function Tests:  Recent Labs Lab 04/01/15 1615  AST 26  ALT 58  ALKPHOS 123  BILITOT 0.8  PROT 6.5  ALBUMIN 3.3*   CBC:  Recent Labs Lab 04/01/15 1615 04/02/15 0410  WBC 6.0 5.2  NEUTROABS 5.1  --   HGB 9.3*  9.2*  HCT 29.5* 29.9*  MCV 90.8 91.7  PLT 311 286    Scheduled Meds: . enoxaparin (LOVENOX) injection  40 mg Subcutaneous Q24H  . feeding supplement (ENSURE ENLIVE)  237 mL Oral TID BM  . levothyroxine  125 mcg Oral QAC breakfast  . morphine  60 mg Oral Q12H  . polyethylene glycol  17 g Oral Daily  . QUEtiapine  100 mg Oral Daily  . senna-docusate  1 tablet Oral BID   Continuous Infusions:

## 2015-04-03 NOTE — Care Management Note (Signed)
Case Management Note  Patient Details  Name: Frank Ward MRN: [id number] Date of Birth: 11/23/1970  Subjective/Objective:              44 yo admitted with Intractable pain from cancer      Action/Plan: From home alone  Expected Discharge Date:   (unknown)               Expected Discharge Plan:  Home/Self Care  In-House Referral:     Discharge planning Services  CM Consult  Post Acute Care Choice:    Choice offered to:     DME Arranged:    DME Agency:     HH Arranged:    [place] Agency:     Status of Service:  In process, will continue to follow  Medicare Important Message Given:    Date Medicare IM Given:    Medicare IM give by:    Date Additional Medicare IM Given:    Additional Medicare Important Message give by:     If discussed at [place] of Stay Meetings, dates discussed:    Additional Comments: Chart reviewed. Awaiting PT eval for assistance with disposition planning. CM will continue to follow. Frank Catalan, RN 04/03/2015, 3:11 PM

## 2015-04-03 NOTE — Progress Notes (Signed)
Pt refused scheduled MS Contin and Seroquel.  RN discussed with patient potential benefits of long-acting pain medication.  Patient still refused.

## 2015-04-03 NOTE — Progress Notes (Addendum)
Patient refused pm meds stating that he needed to "be on point in the morning". Patient reports that he slept through his appointments today because he took a "sleeping pill" the night before. Refused lovenox again tonight.

## 2015-04-03 NOTE — Progress Notes (Signed)
[place] Psychosocial Assessment Clinical Social Work  Clinical Social Work was referred by patient due to working with [facility] CSW team in the past. Pt phoned [facility] CSW team when he was admitted requesting assistance at home with ADLs. Clinical Social Worker met with patient in his hospital room 1338 to check in and re-assess psychosocial, emotional, mental health, and spiritual needs of the patient.  Patient's knowledge about cancer and its treatment including level of understanding, reactions, goals for care, and expectations: Pt's level of understanding of his diagnosis has changed several times through out his cancer care. At times, pt is very aware of his situation and others he appears to be in denial. Through out his treatment, he has wanted to be very much in control of his current situation. He thrives on being independent and recently met his long-term goal of securing his own two bedroom apartment. This had been a goal since 2015 and he plans to return home to this apartment at discharge. He is requesting CNA assistance through the [place] Medicaid program and this is a huge step for him to request assistance in the home. CSW concerned he may need additional assistance other than CNA.  Characteristics of the patient's support system: Pt has family in the local area and has them listed in his ADRs as HCPOA paperwork. He stayed with family at times while working on securing longterm housing. This CSW has never met his family, but his brother and aunt are supportive at times.  Patient and family psychosocial functioning including strengths, limitations, and coping skills: Pt has strong self advocacy skills, but at times can appear demanding to other staff. Pt has always been appropriate to this CSW, however, pt is usually the one to reach out to address his needs.  Identifications of barriers to care: Availability of CNA resources takes several weeks to put into place. CSW to request  referral through Dr. Fredderick [place] office today. However, pt may need hospice services.  Availability of community resources: Pt very aware of resources available, but has not been open to referrals in the past.  Clinical Social Worker follow up needed: Yes.    If yes, follow up plan: [facility] to request [facility] CNA and will follow along for support, work alongside inpatient CSW as disposition becomes more clear.   Loren Racer, [place] Worker [place]  [place] Phone: [phone] Fax: [phone]

## 2015-04-04 ENCOUNTER — Inpatient Hospital Stay (HOSPITAL_COMMUNITY): Payer: Medicare Other

## 2015-04-04 ENCOUNTER — Encounter (HOSPITAL_COMMUNITY): Payer: Self-pay | Admitting: Radiology

## 2015-04-04 MED ORDER — HYDROCOD POLST-CPM POLST ER 10-8 MG/5ML PO SUER
5.0000 mL | Freq: Two times a day (BID) | ORAL | Status: DC | PRN
Start: 2015-04-04 — End: 2015-04-23

## 2015-04-04 NOTE — Progress Notes (Signed)
PT Cancellation Note  Patient Details Name: Frank Ward MRN: [id number] DOB: 1971/06/04   Cancelled Treatment:    Reason Eval/Treat Not Completed: Pain limiting ability to participate. Pt declined participation on today-"Im not gonna do anything today.". Will check back another day.    Weston Anna, MPT Pager: [phone]

## 2015-04-04 NOTE — Progress Notes (Signed)
Patient ID: Frank Ward, male   DOB: 05/15/1971, 44 y.o.   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 Pt explains that pain is throbbing and constant, 10/10 in severity and with no specific alleviating or aggravating factors, occasionally but not consistently radiating to the left arm area, [name], 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  Family Communication:  plan of care discussed with the patient Disposition Plan: Home when stable.   IV access:  Peripheral IV  Procedures and diagnostic studies:    Dg Chest Port 1 View 04-20-2015  Progression of numerous pulmonary nodules compatible with known metastatic disease. 2. Rightward curvature of the thoracic spine is secondary to asymmetric left-sided pathologic fractures, [name], MD  [facility] Pager [phone]  If 7PM-7AM, please contact night-coverage [email or web address] Password [email or web address] 04/04/2015, 12:11 PM   LOS: 3 days   HPI/Subjective: No events overnight. Pt reports hemoptysis.    Objective: Filed Vitals:   04/03/15 1450 04/03/15 2053 04/03/15 2100 04/04/15 0525  BP: 110/78 111/72  110/69  Pulse: 90 99 90 93  Temp: 98.6 F (37 C) 98.6 F (37 C)  97.8 F (36.6 C)  TempSrc: Oral Oral  Oral  Resp: 18 18  18   Height:      SpO2: 98% 97%  100%    Intake/Output Summary (Last 24 hours) at 04/04/15 1211 Last data filed at 04/04/15 0900  Gross per 24 hour  Intake   1320 ml  Output   1660 ml  Net   -340 ml    Exam:   General:  Pt is alert, not in acute distress  Cardiovascular: Regular rate and rhythm, [name], no murmurs, no rubs, no gallops  Respiratory:  diminished breath sounds at bases  Abdomen: Soft, non tender, non distended, bowel sounds present, no guarding   Data Reviewed: Basic Metabolic Panel:  Recent Labs Lab 04/01/15 1615 04/02/15 0410  NA 137 138  K 3.8 4.3  CL 104 103  CO2 27 26  GLUCOSE 121* 117*  BUN 8 11  CREATININE 0.62 0.83  CALCIUM 9.0 8.7*   Liver Function Tests:  Recent Labs Lab 04/01/15 1615  AST 26  ALT 58  ALKPHOS 123  BILITOT 0.8  PROT 6.5  ALBUMIN 3.3*   CBC:  Recent Labs Lab 04/01/15 1615  04/02/15 0410 04/03/15 2229  WBC 6.0 5.2 5.1  NEUTROABS 5.1  --   --   HGB 9.3* 9.2* 9.7*  HCT 29.5* 29.9* 30.5*  MCV 90.8 91.7 91.6  PLT 311 286 349    Scheduled Meds: . enoxaparin (LOVENOX) injection  40 mg Subcutaneous Q24H  . feeding supplement (ENSURE ENLIVE)  237 mL Oral TID BM  . levothyroxine  125 mcg Oral QAC breakfast  . morphine  60 mg Oral Q12H  . polyethylene glycol  17 g Oral Daily  . QUEtiapine  100 mg Oral Daily  . senna-docusate  1 tablet Oral BID   Continuous Infusions:

## 2015-04-04 NOTE — Progress Notes (Signed)
Patient with hemoptysis with dark red blood. <tsp amount. Patient reports that he had "done that" about 4-5 times today. Paged NP on call and orders received for CBC.  Patient did refused lovenox.

## 2015-04-05 LAB — CBC
HCT: 29.5 % — ABNORMAL LOW (ref 39.0–52.0)
HEMOGLOBIN: 9.7 g/dL — AB (ref 13.0–17.0)
MCH: 29.8 pg (ref 26.0–34.0)
MCHC: 32.9 g/dL (ref 30.0–36.0)
MCV: 90.5 fL (ref 78.0–100.0)
PLATELETS: 368 10*3/uL (ref 150–400)
RBC: 3.26 MIL/uL — ABNORMAL LOW (ref 4.22–5.81)
RDW: 16.2 % — AB (ref 11.5–15.5)
WBC: 5.7 10*3/uL (ref 4.0–10.5)

## 2015-04-05 NOTE — Progress Notes (Signed)
Patient continues to refuse scheduled medications. However, he request IV dilaudid every 2 hrs. Patient hs been very irritable and verbally abusive to staff. Refused to have v/s done at beginning of shift and instructed staff not to come in his room because " I have not slept for three days and you all coming in here every minute. I cannot get any rest." This morning after Lab Tech drew his blood sample, patient became very loud and verbally abusive to staff and stated he was going to call security and the police. He requested pain medication at approximately 0445 and this nurse informed him that it was too early for his next dose. Patient then started to yell and use profanity to me. Joe, the [facility] on duty had a talk with patient. He then stated he did not want this nurse in his room. I however went to his room with the [facility] to give him his pain medication. Patient started shouting no, no. I informed him that I was here to give him his pain medication and he became paranoid and stated I was gong to harm him. The administrator was at patient's door and reassured him that he was safe and I was not going to hurt him. Patient then allowed this nurse to administer his pain medication. Resident has been on an emotional roller coaster throughout the shift and exhibited several periods of paranoia.

## 2015-04-05 NOTE — Progress Notes (Addendum)
Patient ID: Frank Ward, male   DOB: [birth year], 44 y.o.   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 Pt explains that pain is throbbing and constant, 10/10 in severity and with no specific alleviating or aggravating factors, occasionally but not consistently radiating to the left arm area, [name], 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  Family Communication:  plan of care discussed with the patient Disposition Plan: Home when stable.   IV access:  Peripheral IV  Procedures and diagnostic studies:    Dg Chest Port 1 View 04-12-15  Progression of numerous pulmonary nodules compatible with known metastatic disease. 2. Rightward curvature of the thoracic spine is secondary to asymmetric left-sided pathologic fractures, [name], MD  [facility] Pager [phone]  If 7PM-7AM, please contact night-coverage [email or web address] Password [email or web address] 04/05/2015, 11:19 AM   LOS: 4 days   HPI/Subjective: No events overnight. Pt reports hemoptysis.    Objective: Filed Vitals:   04/04/15 0525 04/04/15 1300 04/04/15 2110 04/05/15 0537  BP: 110/69 115/71  115/75  Pulse: 93 92  100  Temp: 97.8 F (36.6 C) 98 F (36.7 C)  98.5 F (36.9 C)  TempSrc: Oral Oral Other (Comment) Oral  Resp: 18 18  20   Height:      SpO2: 100% 98%  97%    Intake/Output Summary (Last 24 hours) at 04/05/15 1119 Last data filed at 04/05/15 0900  Gross per 24 hour  Intake    140 ml  Output    750 ml  Net   -610 ml    Exam:   General:  Pt is alert, not in acute distress  Cardiovascular: Regular rate and rhythm, [name], no murmurs, no rubs,  no gallops  Respiratory: diminished breath sounds at bases  Abdomen: Soft, non tender, non distended, bowel sounds present, no guarding   Data Reviewed: Basic Metabolic Panel:  Recent Labs Lab 04/01/15 1615 04/02/15 0410  NA 137 138  K 3.8 4.3  CL 104 103  CO2 27 26  GLUCOSE 121* 117*  BUN 8 11  CREATININE 0.62 0.83  CALCIUM 9.0 8.7*   Liver Function Tests:  Recent Labs Lab 04/01/15 1615  AST 26  ALT 58  ALKPHOS 123  BILITOT 0.8  PROT 6.5  ALBUMIN 3.3*    CBC:  Recent Labs Lab 04/01/15 1615 04/02/15 0410 04/03/15 2229 04/05/15 0422  WBC 6.0 5.2 5.1 5.7  NEUTROABS 5.1  --   --   --   HGB 9.3* 9.2* 9.7* 9.7*  HCT 29.5* 29.9* 30.5* 29.5*  MCV 90.8 91.7 91.6 90.5  PLT 311 286 349 368    Scheduled Meds: . enoxaparin (LOVENOX) injection  40 mg Subcutaneous Q24H  . feeding supplement (ENSURE ENLIVE)  237 mL Oral TID BM  . levothyroxine  125 mcg Oral QAC breakfast  . morphine  60 mg Oral Q12H  . polyethylene glycol  17 g Oral Daily  . QUEtiapine  100 mg Oral Daily  . senna-docusate  1 tablet Oral BID   Continuous Infusions:

## 2015-04-05 NOTE — Progress Notes (Signed)
PT Cancellation Note  Patient Details Name: Frank Ward MRN: [id number] DOB: May 11, 1971   Cancelled Treatment:    Reason Eval/Treat Not Completed: Pain limiting ability to participate. Will follow.    Blondell Reveal Kistler 04/05/2015, 2:02 PM  [phone]

## 2015-04-06 ENCOUNTER — Ambulatory Visit
Admit: 2015-04-06 | Discharge: 2015-04-06 | Disposition: A | Discharge status: Home / Self Care | Payer: Medicare Other | Attending: Radiation Oncology | Admitting: Radiation Oncology

## 2015-04-06 ENCOUNTER — Ambulatory Visit: Payer: Medicare Other

## 2015-04-06 MED ORDER — HYDROMORPHONE HCL 2 MG/ML IJ SOLN
2.0000 mg | Freq: Once | INTRAMUSCULAR | Status: AC
  Administered 2015-04-06: 2 mg via INTRAVENOUS

## 2015-04-06 NOTE — Progress Notes (Signed)
Patient ID: Frank Ward, male   DOB: 12/11/1970, 44 y.o.   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, 10/10 in severity and with no specific alleviating or aggravating factors, occasionally but not consistently radiating to the left arm area, [name], 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  Family Communication:  plan of care discussed with the patient Disposition Plan: Home when stable.   IV access:  Peripheral IV  Procedures and diagnostic studies:    Dg Chest Port 1 View Apr 27, 2015  Progression of numerous pulmonary nodules compatible with known metastatic disease. 2. Rightward curvature of the thoracic spine is secondary to asymmetric left-sided pathologic fractures, [name], [name], please contact night-coverage [email or web address] Password [email or web address] 04/06/2015, 6:54 AM   LOS: 5 days   HPI/Subjective: No events overnight. Pt reports hemoptysis.    Objective: Filed Vitals:   04/04/15 2110 04/05/15 0537 04/05/15 2044 04/06/15 0424  BP:  115/75 113/70 109/60  Pulse:  100 106 105  Temp:  98.5 F (36.9 C) 98.9 F (37.2 C) 97.7 F (36.5 C)  TempSrc: Other (Comment) Oral Oral Oral  Resp:  20 20   Height:      SpO2:  97% 100% 98%    Intake/Output Summary (Last 24 hours) at 04/06/15 0654 Last data filed at 04/06/15 0256  Gross per 24 hour  Intake    600 ml  Output    450 ml  Net    150 ml    Exam:   General:  Pt is alert, [name], [name], no murmurs, no rubs, no gallops  Respiratory: diminished breath sounds at bases   Data Reviewed: Basic Metabolic Panel:  Recent Labs Lab 04/01/15 1615 04/02/15 0410  NA 137 138  K 3.8 4.3  CL 104 103  CO2 27 26  GLUCOSE 121* 117*  BUN 8 11  CREATININE 0.62 0.83  CALCIUM 9.0 8.7*   Liver Function Tests:  Recent Labs Lab 04/01/15 1615  AST 26  ALT 58  ALKPHOS 123  BILITOT 0.8  PROT 6.5  ALBUMIN 3.3*    CBC:  Recent Labs Lab 04/01/15 1615 04/02/15 0410 04/03/15 2229 04/05/15 0422  WBC 6.0 5.2 5.1 5.7  NEUTROABS 5.1  --   --   --   HGB 9.3* 9.2* 9.7* 9.7*  HCT 29.5* 29.9* 30.5* 29.5*  MCV 90.8 91.7 91.6 90.5  PLT 311 286 349 368    Scheduled Meds: . enoxaparin (LOVENOX) injection  40 mg Subcutaneous Q24H  . feeding supplement (ENSURE ENLIVE)  237 mL Oral TID BM  . levothyroxine  125 mcg Oral QAC breakfast  . morphine  60 mg Oral Q12H  . polyethylene glycol  17 g Oral Daily  . QUEtiapine  100 mg Oral Daily  . senna-docusate  1 tablet Oral BID   Continuous Infusions:

## 2015-04-06 NOTE — Progress Notes (Signed)
PT Cancellation Note  Patient Details Name: Jia Mohamed MRN: [id number] DOB: [birth year]   Cancelled Treatment:    Reason Eval/Treat Not Completed: Patient declined, no reason specified. Will check back another day. If pt continues to refuse PT eval, without even attempting to mobilize, will have to consider sign off. Thanks.    Weston Anna, MPT Pager: [phone]

## 2015-04-06 NOTE — Progress Notes (Signed)
Pt has been cooperative for most of the shift.  He did take some of his morning medications today. He did take his stool softener, but he refused his seroquel.  He did drink his 2 ensures. Pt is experiencing some paranoia and behavior issues however i have been able to keep him calm. Continues to take dilauded every 2 hours prn. He complains of generalized pain, but mostly of left clavicle pain.

## 2015-04-07 ENCOUNTER — Ambulatory Visit
Admit: 2015-04-07 | Discharge: 2015-04-07 | Disposition: A | Discharge status: Home / Self Care | Payer: Medicare Other | Attending: Radiation Oncology | Admitting: Radiation Oncology

## 2015-04-07 ENCOUNTER — Ambulatory Visit: Admit: 2015-04-07 | Payer: Medicare Other

## 2015-04-07 ENCOUNTER — Ambulatory Visit: Payer: Medicare Other

## 2015-04-07 DIAGNOSIS — F4323 Adjustment disorder with mixed anxiety and depressed mood: Secondary | ICD-10-CM | POA: Diagnosis present

## 2015-04-07 DIAGNOSIS — C7951 Secondary malignant neoplasm of bone: Principal | ICD-10-CM

## 2015-04-07 DIAGNOSIS — C73 Malignant neoplasm of thyroid gland: Secondary | ICD-10-CM

## 2015-04-07 NOTE — Consult Note (Signed)
[facility] Consult   Reason for Consult:  Capacity evaluation, partially compliant with radiation therapy of cancer with metastasis Referring Physician:  Dr. Doyle Askew Patient Identification: Frank Ward MRN:  [id number] Principal Diagnosis: Adjustment disorder with mixed anxiety and depressed mood Diagnosis:   Patient Active Problem List   Diagnosis Date Noted  . Intractable pain [R52] 04/01/2015  . Cancer [C80.1]   . Nausea with vomiting [R11.2] 01/28/2015  . Weight loss [R63.4] 01/28/2015  . Pathological fracture in neoplastic disease [M84.50XA, D48.9] 02/17/2014  . Noncompliance [Z91.19] 02/16/2014  . Palliative care by specialist [Z78.9] 02/14/2014  . Anxiety state, unspecified [F41.1] 02/14/2014  . Steroid-induced hyperglycemia [R73.9] 02/13/2014  . Psychotic disorder [F29] 02/13/2014  . Foot ulcer [L97.509] 02/13/2014  . Altered mental status [R41.82] 02/07/2014  . Acute encephalopathy [G93.40] 02/07/2014  . Ulcer of left lower leg [L97.929] 02/07/2014  . 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 Bilateral lower extremity edema [R60.0] 01/07/2014  . Numbness and tingling of feet [R20.2] 01/07/2014  . Weakness generalized [R53.1] 12/31/2013  . Cancer associated pain [G89.3] 12/31/2013  . Palliative care encounter [Z51.5] 12/31/2013  . Cancer related pain [G89.3] 12/30/2013  . Anemia [D64.9] 12/30/2013  . 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 Metastatic cancer to brain [C79.31] 08/30/2013  . 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 Cervical spine fracture [S12.9XXA] 08/28/2013  . Hypertension [I10]   . Thyroid disease [E07.9]   . HAMMER TOE, OTHER, ACQUIRED [M20.40] 06/04/2007    Total Time spent with patient: 45 minutes  Subjective:   Frank Ward is a 44 y.o. male patient admitted with intractable pain associated with cancer  and poorly compliant with cancer treatment.  HPI: Frank Ward is a 44 years old single African-American male admitted to [facility] with the intractable pain associated with cancer and poorly compliant with cancer treatment. Patient is known to this provider and the clinical social worker from his previous hospitalization at [facility] in July 2015. Patient reportedly staying with his brother and recently more to his own place and trying to be compliant with medication but sometimes his schedule does not permit. Patient has comprehensive understanding about primary malignant neoplasm of thyroid and metastasis and also know he needed cancer treatment has scheduled and planned. Patient reported he is too smart and he understands everything he does not need to be tested for mental status. Patient is able to cooperate to answer most of the questions related to orientation, immediate memory but not cooperative to test his delayed memory. Patient has a normal language functions. Patient denies symptoms of mania, psychosis, hallucinations, anger outbursts, irritability agitation and combative behaviors. Patient was previously given Seroquel for combative behaviors. Patient has no outpatient psychiatric or inpatient psychiatric services. Patient denies suicidal/homicidal ideation, intention or plans. Patient stated that he has a planned radiation therapy today and he is planning to participate in radiation therapy without any hesitation or resistance.    Past Medical History:  Past Medical History  Diagnosis Date  . Cancer     Thyroid  . Thyroid disease   . Hypertension   . Pneumonia   . Hx of radiation therapy 02/07/14- 02/21/14    T12-L2, sacrum 3000 cGy 10 sessions  . Hx of radiation therapy 09/04/13-09/24/13    whole brain, C7-T3, upper C spine, right scapulae  . Hx of radiation therapy 06/10/14  left ischium 1 fx 800 cGy  . Allergy   . Bone cancer     sacrum,T12-L2,c-spine,lt  ischium    Past Surgical History  Procedure Laterality Date  . Tendon repair    . Thyroidectomy     Family History:  Family History  Problem Relation Age of Onset  . Diabetes Mother   . Diabetes Brother    Social History:  History  Alcohol Use  . 3.0 oz/week  . 5 Cans of beer per week    Comment: occ     History  Drug Use  . Yes  . Special: Marijuana    Social History   Social History  . Marital Status: Single    Spouse Name: N/A  . Number of Children: N/A  . Years of Education: N/A   Social History Main Topics  . Smoking status: Former Smoker -- 10 years    Types: Cigarettes    Quit date: 08/29/2011  . Smokeless tobacco: Never Used  . Alcohol Use: 3.0 oz/week    5 Cans of beer per week     Comment: occ  . Drug Use: Yes    Special: Marijuana  . Sexual Activity: No   Other Topics Concern  . None   Social History Narrative   Lives with his mother in an apartment. He emulates with a cane. He has no home health services and is not followed by hospice. He has met with palliative care during previous admissions.   Additional Social History:                          Allergies:   Allergies  Allergen Reactions  . Penicillins Anaphylaxis, Hives, Itching and Other (See Comments)    Pt reports he does not believe this is true, states he does not recall ever being allergic to penicillin   . Latex Hives and Itching  . Lyrica [Pregabalin] Swelling    Labs: No results found for this or any previous visit (from the past 48 hour(s)).  Vitals: Blood pressure 109/61, pulse 106, temperature 98 F (36.7 C), temperature source Oral, resp. rate 18, height 6' 4.5" (1.943 m), SpO2 98 %.  Risk to Self: Is patient at risk for suicide?: No Risk to Others:   Prior Inpatient Therapy:   Prior Outpatient Therapy:    Current Facility-Administered Medications  Medication Dose Route Frequency Provider Last Rate Last Dose  . calcium carbonate (TUMS - dosed in mg  elemental calcium) chewable tablet 200 mg of elemental calcium  1 tablet Oral Q6H PRN Ritta Slot, NP      . chlorpheniramine-HYDROcodone (TUSSIONEX) 10-8 MG/5ML suspension 5 mL  5 mL Oral Q12H PRN Theodis Blaze, MD      . docusate sodium (COLACE) capsule 100 mg  100 mg Oral BID PRN Theodis Blaze, MD   100 mg at 04/03/15 1028  . enoxaparin (LOVENOX) injection 40 mg  40 mg Subcutaneous Q24H Theodis Blaze, MD   40 mg at 04/01/15 2357  . feeding supplement (ENSURE ENLIVE) (ENSURE ENLIVE) liquid 237 mL  237 mL Oral TID BM Clayton Bibles, RD   237 mL at 04/07/15 1006  . HYDROmorphone (DILAUDID) injection 2-3 mg  2-3 mg Intravenous Q2H PRN Theodis Blaze, MD   2 mg at 04/07/15 1204  . ibuprofen (ADVIL,MOTRIN) tablet 200-400 mg  200-400 mg Oral Q6H PRN Theodis Blaze, MD   400 mg at 04/04/15 0847  .  levothyroxine (SYNTHROID, LEVOTHROID) tablet 125 mcg  125 mcg Oral QAC breakfast Theodis Blaze, MD   125 mcg at 04/07/15 0800  . LORazepam (ATIVAN) tablet 1 mg  1 mg Oral Q8H PRN Theodis Blaze, MD   1 mg at 04/03/15 1531  . morphine (MS CONTIN) 12 hr tablet 60 mg  60 mg Oral Q12H Theodis Blaze, MD   60 mg at 04/05/15 0735  . MUSCLE RUB CREA   Topical PRN Dionne Milo, NP      . ondansetron [facility]) tablet 4 mg  4 mg Oral Q6H PRN Theodis Blaze, MD       Or  . ondansetron [facility]) injection 4 mg  4 mg Intravenous Q6H PRN Theodis Blaze, MD      . polyethylene glycol (MIRALAX / GLYCOLAX) packet 17 g  17 g Oral Daily Theodis Blaze, MD   17 g at 04/06/15 1001  . QUEtiapine (SEROQUEL XR) 24 hr tablet 100 mg  100 mg Oral Daily Theodis Blaze, MD   50 mg at 04/02/15 1033  . senna-docusate (Senokot-S) tablet 1 tablet  1 tablet Oral BID Theodis Blaze, MD   1 tablet at 04/06/15 1001  . traZODone (DESYREL) tablet 100 mg  100 mg Oral QHS PRN Theodis Blaze, MD        Musculoskeletal: Strength & Muscle Tone: decreased Gait & Station: unable to stand Patient leans: N/A  Psychiatric Specialty Exam: Physical Exam as  per history and physical  ROS complains about generalized weakness, intractable pain but denies nausea vomiting chest pain and shortness of breath. Patient has significant weight loss over the one year time and has a poor appetite No Fever-chills, No Headache, No changes with Vision or hearing, reports vertigo No problems swallowing food or Liquids, No Chest pain, Cough or Shortness of Breath, No Abdominal pain, No Nausea or Vommitting, Bowel movements are regular, No Blood in stool or Urine, No dysuria, No new skin rashes or bruises, No new joints pains-aches,  No new weakness, tingling, numbness in any extremity, No recent weight gain or loss, No polyuria, polydypsia or polyphagia,   A full 10 point Review of Systems was done, except as stated above, all other Review of Systems were negative.   Blood pressure 109/61, pulse 106, temperature 98 F (36.7 C), temperature source Oral, resp. rate 18, height 6' 4.5" (1.943 m), SpO2 98 %.There is no weight on file to calculate BMI.  General Appearance: Guarded  Eye Contact::  Good  Speech:  Clear and Coherent  Volume:  Decreased  Mood:  Depressed  Affect:  Constricted and Depressed  Thought Process:  Coherent and Goal Directed  Orientation:  Full (Time, Place, and Person)  Thought Content:  WDL  Suicidal Thoughts:  No  Homicidal Thoughts:  No  Memory:  Immediate;   Fair Recent;   Poor  Judgement:  Fair  Insight:  Fair  Psychomotor Activity:  Decreased  Concentration:  Fair  Recall:  [place] of Knowledge:Good  Language: Good  Akathisia:  Negative  Handed:  Right  AIMS (if indicated):     Assets:  Communication Skills Desire for Improvement Financial Resources/Insurance Housing Leisure Time Resilience Social Support  ADL's:  Intact  Cognition: Impaired,  Mild  Sleep:      Medical Decision Making: Review of Psycho-Social Stressors (1), Review or order clinical lab tests (1), Established Problem, Worsening (2), Review  of Last Therapy Session (1), Review or order  medicine tests (1), Review of Medication Regimen & Side Effects (2) and Review of New Medication or Change in Dosage (2)  Treatment Plan Summary:   Daily contact with patient to assess and evaluate symptoms and progress in treatment and Medication management  Plan:  Patient meets criteria for capacity to make his own medical decisions and living arrangement as per my evaluation today Patient does not meet criteria for psychiatric inpatient admission. Supportive therapy provided about ongoing stressors.  Appreciate psychiatric consultation and we sign off today  Please contact [phone] or [phone] if needs further assistance  Disposition: Patient will be referred to the outpatient counseling services and medically stable  Ryenne Lynam,JANARDHAHA R. 04/07/2015 12:11 PM

## 2015-04-07 NOTE — Progress Notes (Signed)
PT Cancellation Note / PT sign off  Patient Details Name: Frank Ward MRN: [id number] DOB: 01-20-1971   Cancelled Treatment:    Reason Eval/Treat Not Completed: Patient declined, no reason specified This is patient's fifth cancel for PT.  RN reports pt premedicated yet he declines PT today.  Requested nursing staff assist with mobility if/when pt willing and reorder if PT indicated/pt agreeable to participate.  Since pt has had multiple refusals, PT to sign off.  Please re-order only if pt willing to participate.   Kou Gucciardo,KATHrine E 04/07/2015, 2:57 PM Carmelia Bake, PT, DPT 04/07/2015 Pager: [phone]

## 2015-04-07 NOTE — Progress Notes (Signed)
Pt refused to work with physical therapy today. Stated that he will work with them tomorrow however he has refused every time.

## 2015-04-07 NOTE — Progress Notes (Signed)
Mr. Dom was lying in bed and awake when I arrived. He said he remembered me from previous hospital stays (in 2015). Mr. Frisbee said he is getting treatments. He said he has his own apt now and is doing ok. Chaplain listened as he talked about this stay in the hospital and offered presence and support. Please call if any changes or further support is needed. Chaplain Marlise Eves Holder   04/07/15 2000  Clinical Encounter Type  Visited With Patient

## 2015-04-07 NOTE — Progress Notes (Signed)
  Radiation Oncology         [phone] ________________________________  Name: Nowell Sites MRN: [id number]  Date: 04/07/2015  DOB: [birth year]  Weekly Radiation Therapy Management - Inpatient    ICD-9-CM ICD-10-CM   1. 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 . . . . . . . The patient presents for routine under treatment assessment.                                   The patient overall feels his pain is slightly better. He however refused treatment today after being brought down for his last treatment directed at the 3 separate areas. He discussed with me that his neck was causing him discomfort so he could not stay in the treatment position. Patient did undergo psychiatric evaluation as documented in the medical record.                                 Set-up films were reviewed.                                 The chart was checked. Physical Findings. . . This is a pleasant young gentleman lying in his hospital bed. He has wash clothe over his forehead and is complaining of neck pain and headaches. He responds appropriately to questions Impression . . . . . . . The patient is tolerating radiation. Plan . . . . . . . . . . . . Continue treatment as planned. Return tomorrow for his last treatment,  then likely to hospice.  ________________________________   Blair Promise, PhD, MD

## 2015-04-07 NOTE — Care Management Important Message (Signed)
Important Message  Patient Details  Name: Frank Ward MRN: [id number] Date of Birth: 1971-01-05   Medicare Important Message Given:  Yes-second notification given    [name] 04/07/2015, 11:52 AMImportant Message  Patient Details  Name: Frank Ward MRN: [id number] Date of Birth: 05/02/1971   Medicare Important Message Given:  Yes-second notification given    [name] 04/07/2015, 11:52 AM

## 2015-04-07 NOTE — Clinical Social Work Psych Assess (Signed)
Clinical Social Work Nature conservation officer  Clinical Social Worker:  Boone Master, [place] Date/Time:  04/07/2015, 4:19 PM Referred By:  Physician Date Referred:  04/07/15 Reason for Referral:  Competency/Guardianship   Presenting Symptoms/Problems  Presenting Symptoms/Problems(in person's/family's own words):  Psych consulted to determine capacity.   Abuse/Neglect/Trauma History  Abuse/Neglect/Trauma History:  Denies History Abuse/Neglect/Trauma History Comments (indicate dates):  N/A   Psychiatric History  Psychiatric History:  Denies History Psychiatric Medication:  Seroquel   Current Mental Health Hospitalizations/Previous Mental Health History:  Patient denies any previous MH concerns/treatment.   Current Provider:  None currently. Place and Date:  N/A  Current Medications:   Scheduled Meds: . enoxaparin (LOVENOX) injection  40 mg Subcutaneous Q24H  . feeding supplement (ENSURE ENLIVE)  237 mL Oral TID BM  . levothyroxine  125 mcg Oral QAC breakfast  . morphine  60 mg Oral Q12H  . polyethylene glycol  17 g Oral Daily  . QUEtiapine  100 mg Oral Daily  . senna-docusate  1 tablet Oral BID   Continuous Infusions:  PRN Meds:.calcium carbonate, chlorpheniramine-HYDROcodone, docusate sodium, HYDROmorphone (DILAUDID) injection, ibuprofen, LORazepam, MUSCLE RUB, ondansetron **OR** ondansetron (ZOFRAN) IV, traZODone     Previous Inpatient Admission/Date/Reason:  None reported   Emotional Health/Current Symptoms  Suicide/Self Harm: None Reported Suicide Attempt in Past (date/description):  None reported  Other Harmful Behavior (ex. homicidal ideation) (describe):  None reported   Psychotic/Dissociative Symptoms  Psychotic/Dissociative Symptoms: None Reported Other Psychotic/Dissociative Symptoms:  N/A  Attention/Behavioral Symptoms  Attention/Behavioral Symptoms: Withdrawn Other Attention/Behavioral Symptoms:  Patient withdrawn and guarded and  does not wish to complete full mini mental status exam (MMSE)   Cognitive Impairment  Cognitive Impairment:  Within Normal Limits Other Cognitive Impairment:  Patient oriented during assessment.   Mood and Adjustment  Mood and Adjustment:  Guarded   Stress, Anxiety, Trauma, Any Recent Loss/Stressor  Stress, Anxiety, Trauma, Any Recent Loss/Stressor: None Reported Anxiety (frequency):  N/A  Phobia (specify): N/A  Compulsive Behavior (specify):  N/A  Obsessive Behavior (specify):  N/A  Other Stress, Anxiety, Trauma, Any Recent Loss/Stressor:  N/A   Substance Abuse/Use  Substance Abuse/Use: None SBIRT Completed (please refer for detailed history): No Self-reported Substance Use (last use and frequency):  None reported  Urinary Drug Screen Completed: No Alcohol Level:  N/A   Environment/Housing/Living Arrangement  Environmental/Housing/Living Arrangement: Stable Housing Who is in the Home:  Brother  Emergency Contact:  Brother   Pharmacist, community: Medicaid   Patient's Strengths and Goals  Patient's Strengths and Goals (patient's own words):  Patient is working to get his own apartment.   Clinical Social Worker's Interpretive Summary  Clinical Social Workers Interpretive Summary:    CSW and psych MD evaluated patient together. Patient reports he was at cancer center and had to be admitted due to medical problems. Patient is discouraged with treatment but reports he continues to follow up with appointments when he has time. Patient currently living with brother but is trying to get his own apartment. Patient reports he has had to reschedule appointments at the cancer center in order to make arrangements for new apartment.  Patient started MMSE but then stated that psych consult would not benefit him and became non compliant. Patient aware of diagnosis and treatment and psych MD deemed patient to have capacity.   Disposition  Disposition: Recommend Psych  CSW Continuing To Support While In [facility] [place]

## 2015-04-07 NOTE — Progress Notes (Signed)
Patient ID: Frank Ward, male   DOB: Jan 27, 1971, 44 y.o.   MRN: [id number]  [facility] PROGRESS NOTE  Frank Ward EHO:[id number] DOB: 11-Mar-1971 DOA: 06-Apr-2015 PCP: SW consult to set up with PCP  Brief narrative:    Pt is 44 yo male with known metastatic thyroid cancer to the bones, undergoing radiation therapy but with medical non compliance, presented to [facility] ED with main concern of ongoing pain mostly located right below the left clavicle. Pt explains that pain is throbbing and constant, 10/10 in severity and with no specific alleviating or aggravating factors, occasionally but not consistently radiating to the left arm area, [name], pt is hemodynamically stable but in pain. VSS. [facility] asked to admit for further management and radiation treatments.   Hospital course complicated by pt's refusal to have treatments done. Currently has completed treatment on August 18th, 19th, ? 22nd (not sure if he has completed it). Spoke with Dr. Jana Hakim, recommended psych evaluation to start with and consideration of in home palliative care. Also spoke with Dr. Sondra Come radiation oncologist who was planning on completing total of 5 radiation treatments.   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, right scapula)  - rad tx as noted above - CXR with progressive lung metastasis  - palliative care team has been consulted for consideration of in-home palliative care services, appreciate assistance  - psych consult requested as well to determine competency   Pathologic fractures - allow analgesia as noted above  - PT eval requested but pt has been refusing to work with PT  Anemia of neoplastic disease - Hg overall stable  Severe PCM with cachexia - nutritionist consulted and assistance is appreciated   Opioid induced constipation - continue  Miralax and Senna   DVT prophylaxis - Lovenox SQ, pt refuses at times   Code Status: Full.  Family Communication:  plan of care discussed with the patient. He is declining my offer to update any family and says he lives alone and wants to go home when ready. ? Consideration of [facility] vs home with hospice and palliative care. Disposition Plan: Home when radiation treatments done, pt unable to drive himself to treatment so have to complete it while inpatient   IV access:  Peripheral IV  Procedures and diagnostic studies:    Dg Chest Port 1 View 04/06/2015  Progression of numerous pulmonary nodules compatible with known metastatic disease. 2. Rightward curvature of the thoracic spine is secondary to asymmetric left-sided pathologic fractures, unchanged. 3. Stable erosive lesion in the right scapula.    Medical Consultants:  Radiation oncology  PCT Psychiatrist   Other Consultants:  PT  IAnti-Infectives:   None  Frank Ramsay, MD  [facility] Pager [phone]  If 7PM-7AM, please contact night-coverage [email or web address] Password [email or web address] 04/07/2015, 12:09 PM   LOS: 6  days   HPI/Subjective: No events overnight. Pt reports pain and wants Dilaudid.   Objective: Filed Vitals:   04/06/15 0424 04/06/15 1420 04/06/15 2107 04/07/15 0528  BP: 109/60 110/71 115/74 109/61  Pulse: 105 108 108 106  Temp: 97.7 F (36.5 C) 97.8  F (36.6 C) 98.3 F (36.8 C) 98 F (36.7 C)  TempSrc: Oral Oral Oral Oral  Resp:  18 18 18   Height:      SpO2: 98% 100% 98%     Intake/Output Summary (Last 24 hours) at 04/07/15 1209 Last data filed at 04/07/15 1100  Gross per 24 hour  Intake    120 ml  Output    800 ml  Net   -680 ml    Exam:   General:  Pt is alert, not in acute distress  Cardiovascular: Regular rhythm, tachycardic, [name], no murmurs, no rubs, no gallops  Respiratory: diminished breath sounds at bases   Data Reviewed: Basic Metabolic Panel:  Recent Labs Lab 04/01/15 1615 04/02/15 0410  NA 137 138  K 3.8 4.3  CL 104 103  CO2 27 26  GLUCOSE 121* 117*  BUN 8 11  CREATININE 0.62 0.83  CALCIUM 9.0 8.7*   Liver Function Tests:  Recent Labs Lab 04/01/15 1615  AST 26  ALT 58  ALKPHOS 123  BILITOT 0.8  PROT 6.5  ALBUMIN 3.3*   CBC:  Recent Labs Lab 04/01/15 1615 04/02/15 0410 04/03/15 2229 04/05/15 0422  WBC 6.0 5.2 5.1 5.7  NEUTROABS 5.1  --   --   --   HGB 9.3* 9.2* 9.7* 9.7*  HCT 29.5* 29.9* 30.5* 29.5*  MCV 90.8 91.7 91.6 90.5  PLT 311 286 349 368    Scheduled Meds: . enoxaparin (LOVENOX) injection  40 mg Subcutaneous Q24H  . feeding supplement (ENSURE ENLIVE)  237 mL Oral TID BM  . levothyroxine  125 mcg Oral QAC breakfast  . morphine  60 mg Oral Q12H  . polyethylene glycol  17 g Oral Daily  . QUEtiapine  100 mg Oral Daily  . senna-docusate  1 tablet Oral BID   Continuous Infusions:

## 2015-04-07 NOTE — Progress Notes (Signed)
CSW received referral to set pt up with PCP.  Inappropriate CSW referral.  Please consult care management for PCP needs.  Unit CSW signing off.   Alison Murray, MSW, [place] [facility] [phone]

## 2015-04-08 ENCOUNTER — Ambulatory Visit
Admit: 2015-04-08 | Discharge: 2015-04-08 | Disposition: A | Discharge status: Home / Self Care | Payer: Medicare Other | Attending: Radiation Oncology | Admitting: Radiation Oncology

## 2015-04-08 DIAGNOSIS — R52 Pain, unspecified: Secondary | ICD-10-CM

## 2015-04-08 DIAGNOSIS — I10 Essential (primary) hypertension: Secondary | ICD-10-CM

## 2015-04-08 DIAGNOSIS — Z9119 Patient's noncompliance with other medical treatment and regimen: Secondary | ICD-10-CM

## 2015-04-08 DIAGNOSIS — Z515 Encounter for palliative care: Secondary | ICD-10-CM

## 2015-04-08 DIAGNOSIS — D630 Anemia in neoplastic disease: Secondary | ICD-10-CM

## 2015-04-08 LAB — CREATININE, SERUM
CREATININE: 0.74 mg/dL (ref 0.61–1.24)
GFR calc Af Amer: >60 mL/min (ref >60)

## 2015-04-08 LAB — GLUCOSE, CAPILLARY: Glucose-Capillary: 92 mg/dL (ref 65–99)

## 2015-04-08 MED ORDER — KETOROLAC TROMETHAMINE 30 MG/ML IJ SOLN
30.0000 mg | Freq: Four times a day (QID) | INTRAMUSCULAR | Status: DC | PRN
  Administered 2015-04-08 – 2015-04-13 (×14): 30 mg via INTRAVENOUS
  Filled 2015-04-08 (×14): qty 1

## 2015-04-08 NOTE — Consult Note (Signed)
Consultation Note Date: 04/08/2015   Patient Name: Frank Ward  DOB: 06-21-1971  MRN: [id number]  Age / Sex: 44 y.o., male   PCP: No Pcp Per Patient Referring Physician: Venetia Maxon Rama, MD  Reason for Consultation: Establishing goals of care, Pain control and Psychosocial/spiritual support  Palliative Care Assessment and Plan Summary of Established Goals of Care and Medical Treatment Preferences    Palliative Care Discussion Held Today:    This NP Wadie Lessen reviewed medical records, received report from team,  then meet at the patient's bedside to re-introduce myself.  This patient is well known to me from past hospitalizations.  Patient has been found to have capacity to make his own decisions currently,  as was the same situation  many times in the past.      It is difficult working with Frank Ward.  He wants to control all elements of his treatment plan, which are not in accordance with what his providers are recommending specific to pain management.  This has been an issue in the past with oncology recommendations which resulted in  fragmented care    Per EMR multiple providers continue to work with Frank Ward regarding his plan of care; treatment options, pain management strtegies and EOL decisions.   Values and goals of care important to patient  were attempted to be elicited.   Questions and concerns addressed.   PMT will continue to support holistically.    Primary Decision Maker: Patient himself, he does have an AD  Goals of Care  Patient is open to all offered and available interventions to prolong life, he tells me he is "going to live 10 more years".  However he continues to be non-compliant with recommended medical treatments.  Symptoms:  Neck pain; does not want to engage in dialogue regarding current recommendations for pain.  He is responsive to simple neck massage.  This NP will engaged with Frank Ward intermittently with a soft  approach.  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 Pt explains that pain is throbbing and constant, 10/10 in severity and with no specific alleviating or aggravating factors, occasionally but not consistently radiating to the left arm area, associated with swelling in that area and hand and arm tingling and numbness. Pt reports he is currently under radiation therapy    Hospital course complicated by patient's noo-comlicance with recommended treatment plan.[facility] has evaluated patein and finds him to have medical decision making capacity.  Dr. Sondra Come radiation oncologist  planning on completing total of 5 radiation treatments.    Social work and Case Managers aware of complicated situation.  I spoke to OP Palliative Services through [facility] and this patient refused visit  Primary Diagnoses  Present on Admission:  . Intractable pain . Cancer associated pain . Adjustment disorder with mixed anxiety and depressed mood . Hypertension . 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 Anemia in neoplastic disease  Palliative Review of Systems:    -pain neck   I have reviewed the medical record, interviewed the patient and  family, and examined the patient. The following aspects are pertinent.  Past Medical History  Diagnosis Date  . Cancer     Thyroid  . Thyroid disease   . Hypertension   . Pneumonia   . Hx of radiation therapy 02/07/14- 02/21/14    T12-L2, sacrum 3000 cGy 10 sessions  . Hx of radiation  therapy 09/04/13-09/24/13    whole brain, C7-T3, upper C spine, right scapulae  . Hx of radiation therapy 06/10/14    left ischium 1 fx 800 cGy  . Allergy   . Bone cancer     sacrum,T12-L2,c-spine,lt ischium   Social History   Social History  . Marital Status: Single    Spouse Name: N/A  . Number of Children: N/A  . Years of Education: N/A   Social History Main Topics  . Smoking status: Former Smoker -- 10 years    Types: Cigarettes    Quit date: 08/29/2011  . Smokeless tobacco: Never Used  . Alcohol Use: 3.0 oz/week    5 Cans of beer per week     Comment: occ  . Drug Use: Yes    Special: Marijuana  . Sexual Activity: No   Other Topics Concern  . None   Social History Narrative   Lives with his mother in an apartment. He emulates with a cane. He has no home health services and is not followed by hospice. He has met with palliative care during previous admissions.   Family History  Problem Relation Age of Onset  . Diabetes Mother   . Diabetes Brother    Scheduled Meds: . enoxaparin (LOVENOX) injection  40 mg Subcutaneous Q24H  . feeding supplement (ENSURE ENLIVE)  237 mL Oral TID BM  . levothyroxine  125 mcg Oral QAC breakfast  . morphine  60 mg Oral Q12H  . polyethylene glycol  17 g Oral Daily  . QUEtiapine  100 mg Oral Daily  . senna-docusate  1 tablet Oral BID   Continuous Infusions:  PRN Meds:.calcium carbonate, chlorpheniramine-HYDROcodone, docusate sodium, HYDROmorphone (DILAUDID) injection, ibuprofen, LORazepam, MUSCLE RUB, ondansetron **OR** ondansetron (ZOFRAN) IV, traZODone Medications Prior to Admission:  Prior to Admission medications   Medication Sig Start Date End Date Taking? Authorizing Provider  dexamethasone (DECADRON) 4 MG tablet Take 1 tablet (4 mg total) by mouth every morning. 01/27/15  Yes Chauncey Cruel, MD  docusate sodium (COLACE) 100 MG capsule Take 1 capsule (100 mg total) by mouth 2 (two) times daily as needed for mild constipation.  07/02/14  Yes Laurie Panda, NP  furosemide (LASIX) 20 MG tablet Take 1 tablet (20 mg total) by mouth daily. 02/26/15  Yes Laurie Panda, NP  HYDROmorphone (DILAUDID) 4 MG tablet Take 1 tablet (4 mg total) by mouth every 4 (four) hours as needed. Patient taking differently: Take 4 mg by mouth every 4 (four) hours as needed for moderate pain or severe pain.  02/26/15  Yes Laurie Panda, NP  ibuprofen (ADVIL,MOTRIN) 200 MG tablet Take 200-400 mg by mouth every 6 (six) hours as needed for mild pain.   Yes Historical Provider, MD  levothyroxine (SYNTHROID, LEVOTHROID) 125 MCG tablet Take 1 tablet (125 mcg total) by mouth daily before breakfast. 01/27/15  Yes Chauncey Cruel, MD  LORazepam (ATIVAN) 1 MG tablet Take 1 tablet (1 mg total) by mouth every 8 (eight) hours as needed for anxiety. 01/27/15  Yes Chauncey Cruel, MD  morphine (MS CONTIN) 60 MG 12 hr tablet Take 1 tablet (60  mg total) by mouth every 12 (twelve) hours. 02/26/15  Yes Laurie Panda, NP  QUEtiapine (SEROQUEL XR) 50 MG TB24 24 hr tablet Take 2 tablets (100 mg total) by mouth daily. 02/22/14  Yes Robbie Lis, MD  traZODone (DESYREL) 100 MG tablet Take 1 tablet (100 mg total) by mouth at bedtime as needed for sleep. 01/27/15  Yes Chauncey Cruel, MD   Allergies  Allergen Reactions  . Penicillins Anaphylaxis, Hives, Itching and Other (See Comments)    Pt reports he does not believe this is true, states he does not recall ever being allergic to penicillin   . Latex Hives and Itching  . Lyrica [Pregabalin] Swelling   CBC:    Component Value Date/Time   WBC 5.7 04/05/2015 0422   WBC 2.9* 01/28/2015 1327   WBC 6.1 04/13/2013 1420   HGB 9.7* 04/05/2015 0422   HGB 9.8* 01/28/2015 1327   HGB 13.9 04/13/2013 1420   HCT 29.5* 04/05/2015 0422   HCT 30.0* 01/28/2015 1327   HCT 41.5 04/13/2013 1420   PLT 368 04/05/2015 0422   PLT 297 01/28/2015 1327   PLT 266 04/13/2013 1420   MCV 90.5 04/05/2015 0422   MCV 88.5  01/28/2015 1327   MCV 92 04/13/2013 1420   NEUTROABS 5.1 04/01/2015 1615   NEUTROABS 1.7 01/28/2015 1327   NEUTROABS 4.2 04/02/2013 1242   LYMPHSABS 0.4* 04/01/2015 1615   LYMPHSABS 0.7* 01/28/2015 1327   LYMPHSABS 1.2 04/02/2013 1242   MONOABS 0.5 04/01/2015 1615   MONOABS 0.4 01/28/2015 1327   MONOABS 0.6 04/02/2013 1242   EOSABS 0.0 04/01/2015 1615   EOSABS 0.1 01/28/2015 1327   EOSABS 0.1 04/02/2013 1242   BASOSABS 0.0 04/01/2015 1615   BASOSABS 0.0 01/28/2015 1327   BASOSABS 0.1 04/02/2013 1242   Comprehensive Metabolic Panel:    Component Value Date/Time   NA 138 04/02/2015 0410   NA 139 01/28/2015 1327   NA 138 04/13/2013 1420   K 4.3 04/02/2015 0410   K 3.5 01/28/2015 1327   K 3.8 04/13/2013 1420   CL 103 04/02/2015 0410   CL 103 04/13/2013 1420   CO2 26 04/02/2015 0410   CO2 26 01/28/2015 1327   CO2 30 04/13/2013 1420   BUN 11 04/02/2015 0410   BUN 3.4* 01/28/2015 1327   BUN 16 04/13/2013 1420   CREATININE 0.74 04/08/2015 0413   CREATININE 0.8 01/28/2015 1327   CREATININE 1.15 04/13/2013 1420   GLUCOSE 117* 04/02/2015 0410   GLUCOSE 111 01/28/2015 1327   GLUCOSE 84 04/13/2013 1420   CALCIUM 8.7* 04/02/2015 0410   CALCIUM 8.7 01/28/2015 1327   CALCIUM 8.7 04/13/2013 1420   AST 26 04/01/2015 1615   AST 16 12/19/2014 1609   AST 15 04/13/2013 1420   ALT 58 04/01/2015 1615   ALT <6 12/19/2014 1609   ALT 33 04/13/2013 1420   ALKPHOS 123 04/01/2015 1615   ALKPHOS 94 12/19/2014 1609   ALKPHOS 107 04/13/2013 1420   BILITOT 0.8 04/01/2015 1615   BILITOT 0.35 12/19/2014 1609   BILITOT 0.6 04/13/2013 1420   PROT 6.5 04/01/2015 1615   PROT 5.4* 12/19/2014 1609   PROT 6.5 04/13/2013 1420   ALBUMIN 3.3* 04/01/2015 1615   ALBUMIN 2.9* 12/19/2014 1609   ALBUMIN 3.3* 04/13/2013 1420    Physical Exam:  Vital Signs: BP 107/69 mmHg  Pulse 107  Temp(Src) 98.1 F (36.7 C) (Oral)  Resp 18  Ht 6' 4.5" (1.943 m)  SpO2 97% SpO2: SpO2:  97 % O2 Device: O2 Device:  Not Delivered O2 Flow Rate:   Intake/output summary:  Intake/Output Summary (Last 24 hours) at 04/08/15 1037 Last data filed at 04/08/15 0537  Gross per 24 hour  Intake      0 ml  Output    350 ml  Net   -350 ml   LBM: Last BM Date: 04/03/15 Baseline Weight:   Most recent weight:    Exam Findings:   General: weak and ill appearing, cachectic CVS: tachycardic Resp: CTA Skin: warm and dry Neuro: alert and oriented X3           Palliative Performance Scale: 50 %                Additional Data Reviewed: Recent Labs     04/08/15  0413  CREATININE  0.74     Time In: 1000 Time Out: 1050 Time Total: 50 min  Greater than 50%  of this time was spent counseling and coordinating care related to the above assessment and plan.  Discussed with  Dr  Rockne Menghini  Signed by: Wadie Lessen, NP  Knox Royalty, NP  04/08/2015, 10:37 AM  Please contact Palliative Medicine Team phone at [phone] for questions and concerns.   See AMION for contact information

## 2015-04-08 NOTE — Progress Notes (Signed)
Clinical Social Work  CSW staffed case with psych MD who reports patient has capacity. Psych CSW is signing off but available if further needs arise.  Frank Ward, Frank Ward [phone]

## 2015-04-08 NOTE — Progress Notes (Signed)
Patient is pleasant and cooperative today.  He refused his seroquel and lovenox injection.  Patient originally was scheduled for radiation at 1200, however, wanted the time changed to this afternoon.  His radiation treatment is now scheduled for 1530.  He is agreeable with the time.

## 2015-04-08 NOTE — Progress Notes (Signed)
Progress Note   Frank Ward POE:[id number] DOB: 1971/02/22 DOA: 04/01/2015 PCP: No PCP Per Patient   Brief Narrative:   Frank Ward is an 44 y.o. 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 Patient underwent psychiatric evaluation 04/07/15 secondary to refusal of radiation treatments and was diagnosed with adjustment disorder with mixed anxiety and depressed mood. He was felt to have capacity to make his own medical decisions.  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 - Has been noncompliant with treatment planning as an outpatient with failure to follow-up on multiple occasions. - Continue scheduled MS Contin. Continue ibuprofen and Dilaudid as needed. - Pain control efforts limited by noncompliance with completing radiation treatments as refusal of oral pain medications. - We will add IV Toradol for anti-inflammatory benefit.  Active Problems:   Hypothyroidism - Continue Synthroid.    Opiod-induced constipation - Continue Senokot, Colace and MiraLAX.    Severe protein calorie malnutrition with cachexia - Continue Ensure supplements.    Noncompliance - Related to underlying depression/anxiety and loss of control in this young man facing a terminal diagnosis. - Status post psychiatry evaluation where he was found to have capacity.    Adjustment disorder with mixed anxiety and depressed mood - Continue Seroquel, and when necessary Ativan and trazodone.    Anemia in neoplastic  disease - No current indication for transfusion.    DVT Prophylaxis - Lovenox ordered but has been intermittently refusing to take it.  Family Communication: No family present at the bedside. Disposition Plan: Home versus SNF or residential hospice when stable and pain controlled. Code Status:     Code Status Orders        Start     Ordered   04/01/15 1930  Full code   Continuous     04/01/15 1929        IV Access:    Peripheral IV   Procedures and diagnostic studies:   Ct Chest Wo Contrast  04-18-2015   CLINICAL DATA:  History of metastatic thyroid cancer  EXAM: CT CHEST WITHOUT CONTRAST  TECHNIQUE: Multidetector CT imaging of the chest was performed following the standard protocol without IV contrast.  COMPARISON:  12/24/2014  FINDINGS: The lungs are well aerated bilaterally. The moderate right-sided pleural effusion is again identified stable from the previous exam. No significant left pleural effusion is seen. There are multiple too-numerous-to-count pulmonary mass is identified bilaterally. Largest of these on the left measures approximately 3.4 cm and is stable in size from the prior exam. Conglomeration of nodules is noted centrally. No significant hilar or mediastinal adenopathy is noted.  The bony structures show multiple areas of metastatic disease. An expansile lytic lesion is noted within the scapula on the right laterally just below the glenoid and extending into the inferior aspect of the glenoid. Additionally an expansile lesion is noted within the right anterior chest wall. It measures approximately 5.8 cm in greatest dimension and is slightly enlarged from the prior exam. Multiple spinal lesions are seen. The areas of worst lytic destruction are noted in the left vertebral body at  T10 as well as the left half of the vertebral body at T12 and within the pedicle and lamina and extending laterally from the L1. The changes at L1 show encroachment and destruction of the  posterior wall of the spinal canal although no definitive soft tissue component is seen within the canal. The lack of IV contrast somewhat limits evaluation. The remainder the upper abdomen is within normal limits with the exception of metastatic disease of the left adrenal gland. The adrenal nodule measures 2.7 cm increased from 1.9 cm on the prior exam.  IMPRESSION: Changes consistent with the given clinical history of metastatic thyroid cancer to the lungs and bones as well as the left adrenal gland. There is been some slight increase in the degree of bony abnormalities. The lung abnormalities are roughly stable from the prior exam.  Stable right-sided effusion.   Electronically Signed   By: Inez Catalina M.D.   On: 04/04/2015 14:16   Dg Chest Port 1 View  04/01/2015   CLINICAL DATA:  Left clavicle and shoulder pain. Thyroid cancer with metastatic bone disease.  EXAM: PORTABLE CHEST - 1 VIEW  COMPARISON:  CT chest 12/24/2014.  FINDINGS: The heart size is normal. Multiple bilateral pulmonary nodules are present bilaterally compatible with known metastatic disease. There has been some progression since the prior study. No focal airspace consolidation is evident otherwise.  Rightward curvature is present in the lower thoracic spine due to collapse pathologic left-sided fracture, unchanged. An erosive lesion is again noted in the right scapula. No definite new lesions are evident.  IMPRESSION: 1. Progression of numerous pulmonary nodules compatible with known metastatic disease. 2. Rightward curvature of the thoracic spine is secondary to asymmetric left-sided pathologic fractures, unchanged. 3. Stable erosive lesion in the right scapula.   Electronically Signed   By: San Morelle M.D.   On: 04/01/2015 16:19     Medical Consultants:    [name], MD  Ambrose Finland, MD  Anti-Infectives:    None.  Subjective:   Frank Ward is barely coherent. He specifically is  requesting that I increase his Dilaudid to 4 mg IV and tells me that his pain is not controlled on 3 mg. He can barely stay awake to have any conversation at all. Nursing staff reports that he was upset earlier today when he was told that his radiation treatment was going to be at 12 noon. The nurse was able to get it rescheduled later in the afternoon.  Objective:    Filed Vitals:   04/07/15 0528 04/07/15 1439 04/07/15 2051 04/08/15 0536  BP: 109/61 101/59 107/69 107/69  Pulse: 106 62 98 107  Temp: 98 F (36.7 C) 98.2 F (36.8 C) 98 F (36.7 C) 98.1 F (36.7 C)  TempSrc: Oral Oral Oral Oral  Resp: 18 18 18 18   Height:      SpO2:  96% 96% 97%    Intake/Output Summary (Last 24 hours) at 04/08/15 0830 Last data filed at 04/08/15 0537  Gross per 24 hour  Intake      0 ml  Output    350 ml  Net   -350 ml    Exam: Gen: Lethargic Cardiovascular:  Tachycardic with a hyperdynamic precordium, No M/R/G Respiratory:  Lungs diminished Gastrointestinal:  Abdomen soft, NT/ND, + BS Extremities:  No C/E/C   Data Reviewed:    Labs: Basic Metabolic Panel:  Recent Labs Lab 04/01/15 1615 04/02/15 0410 04/08/15 0413  NA 137 138  --   K  3.8 4.3  --   CL 104 103  --   CO2 27 26  --   GLUCOSE 121* 117*  --   BUN 8 11  --   CREATININE 0.62 0.83 0.74  CALCIUM 9.0 8.7*  --    GFR CrCl cannot be calculated (Unknown ideal weight.). Liver Function Tests:  Recent Labs Lab 04/01/15 1615  AST 26  ALT 58  ALKPHOS 123  BILITOT 0.8  PROT 6.5  ALBUMIN 3.3*   CBC:  Recent Labs Lab 04/01/15 1615 04/02/15 0410 04/03/15 2229 04/05/15 0422  WBC 6.0 5.2 5.1 5.7  NEUTROABS 5.1  --   --   --   HGB 9.3* 9.2* 9.7* 9.7*  HCT 29.5* 29.9* 30.5* 29.5*  MCV 90.8 91.7 91.6 90.5  PLT 311 286 349 368   CBG:  Recent Labs Lab 04/08/15 0827  GLUCAP 92   Microbiology No results found for this or any previous visit (from the past 240 hour(s)).   Medications:   . enoxaparin  (LOVENOX) injection  40 mg Subcutaneous Q24H  . feeding supplement (ENSURE ENLIVE)  237 mL Oral TID BM  . levothyroxine  125 mcg Oral QAC breakfast  . morphine  60 mg Oral Q12H  . polyethylene glycol  17 g Oral Daily  . QUEtiapine  100 mg Oral Daily  . senna-docusate  1 tablet Oral BID   Continuous Infusions:   Time spent: 25 minutes.   LOS: 7 days   [facility] Pager [phone]. If unable to reach me by pager, please call my cell phone at [phone].  *Please refer to [email or web address], password [email or web address] to get updated schedule on who will round on this patient, as hospitalists switch teams weekly. If 7PM-7AM, please contact night-coverage at [email or web address], password [email or web address] for any overnight needs.  04/08/2015, 8:30 AM

## 2015-04-09 ENCOUNTER — Other Ambulatory Visit: Payer: Self-pay | Admitting: Diagnostic Radiology

## 2015-04-09 DIAGNOSIS — G893 Neoplasm related pain (acute) (chronic): Secondary | ICD-10-CM

## 2015-04-09 DIAGNOSIS — K5900 Constipation, unspecified: Secondary | ICD-10-CM | POA: Diagnosis present

## 2015-04-09 DIAGNOSIS — C73 Malignant neoplasm of thyroid gland: Secondary | ICD-10-CM

## 2015-04-09 DIAGNOSIS — C7931 Secondary malignant neoplasm of brain: Secondary | ICD-10-CM

## 2015-04-09 DIAGNOSIS — C7951 Secondary malignant neoplasm of bone: Principal | ICD-10-CM

## 2015-04-09 MED ORDER — POLYETHYLENE GLYCOL 3350 17 G PO PACK
17.0000 g | PACK | Freq: Two times a day (BID) | ORAL | Status: DC
  Administered 2015-04-17 – 2015-04-21 (×6): 17 g via ORAL
  Filled 2015-04-09 (×18): qty 1

## 2015-04-09 MED ORDER — BISACODYL 10 MG RE SUPP: 10.0000 mg | Freq: Every day | RECTAL | Status: DC | PRN

## 2015-04-09 NOTE — Progress Notes (Signed)
Patient refused pm meds and lovenox. Does not want to take ms contin and dilaudid together because it "makes me have blurred vision".

## 2015-04-09 NOTE — Progress Notes (Signed)
Frank Ward was sitting up in bed when I arrived. I noticed two meals; he said one was lunch and one dinner. He said he would eat it when he has an appetite. Frank Ward asked me to pray for him and especially for his mother. He also wanted to talk about his recent conversation w/his doctor. He said he was told he has two months to live and also remarked they don't know. He said he was told that two years ago. He talked about his cancer and wants all treatment possible. He also remarked everyone will die and he will know when it's time. Provided listening and pastoral to pt as he talked about his journey of illness and recent prognosis.  He was very pleasant, as usual during our visits.  Please page if additional support is needed.   04/09/15 1900  Clinical Encounter Type  Visited With Patient

## 2015-04-09 NOTE — Progress Notes (Signed)
Patient woke up this am in "severe pain". Last pain med had been around [place]. Patient said "you are suppose to bring it to me every 2 hours whether I am asleep or not". Explained to patient that with prns he had to ask for them. Again offered mscontin and he still refused. Other prns given.

## 2015-04-09 NOTE — Progress Notes (Signed)
Nutrition Follow-up  DOCUMENTATION CODES:   Severe malnutrition in context of chronic illness, Underweight  INTERVENTION:   Recommend weight be obtained for this admission (last weight documented from 7/19) -Continue Ensure Enlive po TID, each supplement provides 350 kcal and 20 grams of protein -Encourage PO intake -RD to continue to monitor  NUTRITION DIAGNOSIS:   Malnutrition related to cancer and cancer related treatments as evidenced by percent weight loss, severe depletion of body fat, severe depletion of muscle mass.  Ongoing.  GOAL:   Patient will meet greater than or equal to 90% of their needs  Progressing.  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 Pt explains that pain is throbbing and constant, 10/10 in severity and with no specific alleviating or aggravating factors, occasionally but not consistently radiating to the left arm area, associated with swelling in that area and hand and arm tingling and numbness.  Pt eating ~50% of regular diet. Pt currently refusing Ensure supplements. Per RN from interdisciplinary rounds, pt has been refusing some care. Palliative care consulted.   Labs reviewed.  Diet Order:  Diet regular Room service appropriate?: Yes; Fluid consistency:: Thin  Skin:  Reviewed, no issues  Last BM:  8/22  Height:   Ht Readings from Last 1 Encounters:  04/01/15 6' 4.5" (1.943 m)    Weight:   Wt Readings from Last 1 Encounters:  03/03/15 138 lb 6.4 oz (62.778 kg)    Ideal Body Weight:  94.5 kg  BMI:  There is no weight on file to calculate BMI.  Estimated Nutritional Needs:   Kcal:  2100-2300  Protein:  100-110g  Fluid:  2.1L/day  EDUCATION NEEDS:    No education needs identified at this time  Clayton Bibles, MS, RD, LDN Pager: [phone] After Hours Pager: [phone]

## 2015-04-09 NOTE — Progress Notes (Signed)
Oaklee Kani Chauvin   DOB:11/02/1970   AC#:[id number]   [facility]#:[id number]  Subjective: spoke at length with patient at bedside, Dr Rama in room   Objective: evaluated at bedside Filed Vitals:   04/09/15 0313  BP: 104/55  Pulse: 96  Temp: 97.2 F (36.2 C)  Resp: 20    There is no weight on file to calculate BMI.  Intake/Output Summary (Last 24 hours) at 04/09/15 0902 Last data filed at 04/08/15 2115  Gross per 24 hour  Intake    240 ml  Output    200 ml  Net     40 ml        Recent Labs  04/08/15 0827  GLUCAP 92     Labs:  Lab Results  Component Value Date   WBC 5.7 04/05/2015   HGB 9.7* 04/05/2015   HCT 29.5* 04/05/2015   MCV 90.5 04/05/2015   PLT 368 04/05/2015   NEUTROABS 5.1 04/01/2015    @LASTCHEMISTRY @  Urine Studies No results for input(s): UHGB, CRYS in the last 72 hours.  Invalid input(s): UACOL, UAPR, USPG, UPH, UTP, UGL, UKET, UBIL, UNIT, UROB, ULEU, UEPI, UWBC, URBC, UBAC, CAST, UCOM, BILUA  Basic Metabolic Panel:  Recent Labs Lab 04/08/15 0413  CREATININE 0.74   GFR CrCl cannot be calculated (Unknown ideal weight.). Liver Function Tests: No results for input(s): AST, ALT, ALKPHOS, BILITOT, PROT, ALBUMIN in the last 168 hours. No results for input(s): LIPASE, AMYLASE in the last 168 hours. No results for input(s): AMMONIA in the last 168 hours. Coagulation profile No results for input(s): INR, PROTIME in the last 168 hours.  CBC:  Recent Labs Lab 04/03/15 2229 04/05/15 0422  WBC 5.1 5.7  HGB 9.7* 9.7*  HCT 30.5* 29.5*  MCV 91.6 90.5  PLT 349 368   Cardiac Enzymes: No results for input(s): CKTOTAL, CKMB, CKMBINDEX, TROPONINI in the last 168 hours. BNP: Invalid input(s): POCBNP CBG:  Recent Labs Lab 04/08/15 0827  GLUCAP 92   D-Dimer No results for input(s): DDIMER in the last 72 hours. Hgb A1c No results for input(s): HGBA1C in the last 72 hours. Lipid Profile No results for input(s): CHOL, HDL, LDLCALC, TRIG,  CHOLHDL, LDLDIRECT in the last 72 hours. Thyroid function studies No results for input(s): TSH, T4TOTAL, T3FREE, THYROIDAB in the last 72 hours.  Invalid input(s): FREET3 Anemia work up No results for input(s): VITAMINB12, FOLATE, FERRITIN, TIBC, IRON, RETICCTPCT in the last 72 hours. Microbiology No results found for this or any previous visit (from the past 240 hour(s)).    Studies:  No results found.  Assessment: 44 y.o. 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 Brain metastases noted on head CT 02/05/2013  (1) s/p palliative external beam radiation therapy to the whole brain/upper mid cervical spine, 3000 cGy in 10 sessions (09/04/2013 through 09/24/2013)  B. Spine lesions (has received radiation to C7-T3, T9, Right scapula, and T12-L2)  (2) s/p radiation to T9 (Dr Donella Stade) completed 02/12/2013  (3) Status post abbreviated palliative radiotherapy to the lower cervical/upper thoracic spine (C7-T3) receiving 2100 cGy in 7 sessions  (4) Status post abbreviated course of palliative radiation therapy to the right scapula with the same fractionation. He did not receive his final 3 fractions of planned  radiation therapy to his right scapula or lower cervical/upper thoracic spine (C7-T3). (Dr. [name])  (5) More recently, he completed treatment to his lower thoracic/upper lumbar spine (T12-L2) and sacrum, receiving 3000 cGy in 10 sessions completed his therapy on 02/21/2014. (Dr. Valere Dross)  C. 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 Mr Kepner refuses oral narcotics and PCA narcotics-- he wants to continue the current IV dilaudid treatment, which is working well. He states when he goes home he will go  Back to the oral narcotics he was on before.  He feels he need radiation to the right shoulder. We will ask Rad Onc to consult.  He feels he needs to be in the hospital "about two weeks" while receiving radiation. He has an apr but the furniture is in storage and he does not think he can get the furniture there "before the end of the month."  He becomes agitated when end of life issues are discussed and states we have treatment we are refusing to give him. He admits he does not know why we don't give him the treatment. He states he will go to [facility] for a second opinion when he is discharged. I was unable to clarify his end of life plans firther and he remains a full code.  This is a very difficutl case. I wonder what other resources we can bring in to help [place] optimize this patient's functinal status and make sure he is safe and comfortable when he is discharged, wherever he ends up going.  A repeat psychiatric evaluation may be helpful with those questions in mind  Will follow with you   Chauncey Cruel, MD 04/09/2015  9:02 AM Medical Oncology and Hematology [facility] [place], [place] [address] Tel. [phone]    Fax. [phone]

## 2015-04-09 NOTE — Progress Notes (Signed)
Progress Note   Frank Ward IQN:[id number] DOB: [birth year] DOA: 04/01/2015 PCP: No PCP Per Patient   Brief Narrative:   Frank Ward is an 44 y.o. 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 Patient underwent psychiatric evaluation 04/07/15 secondary to refusal of radiation treatments and was diagnosed with adjustment disorder with mixed anxiety and depressed mood. He was felt to have capacity to make his own medical decisions.  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 - Has been noncompliant with treatment planning as an outpatient with failure to follow-up on multiple occasions. - Continue scheduled MS Contin and Toradol. Continue Dilaudid as needed. - Pain control efforts limited by noncompliance with completing radiation treatments as refusal of oral pain medications. - Refuses to consider a PCA for better pain control.  Active Problems:   Hypothyroidism - Continue Synthroid.    Opiod-induced constipation - Continue Senokot, Colace and MiraLAX.    Severe protein calorie malnutrition with cachexia - Continue Ensure supplements. Dietitian following.    Noncompliance - Related to underlying depression/anxiety and loss of control in this young man facing a terminal diagnosis. - Status post psychiatry evaluation where he was found to have capacity.    Adjustment disorder with mixed anxiety and depressed mood - Continue Seroquel (unfortunately, refuses), and when necessary Ativan  and trazodone.    Anemia in neoplastic disease - No current indication for transfusion.    DVT Prophylaxis - Lovenox ordered but has been intermittently refusing to take it.  Family Communication: No family present at the bedside. Disposition Plan: Home versus SNF or residential hospice when stable and pain controlled.  Will be challenging to discharge him to a safe environment given his mental health issues and lack of family support. Code Status:     Code Status Orders        Start     Ordered   04/01/15 1930  Full code   Continuous     04/01/15 1929        IV Access:    Peripheral IV   Procedures and diagnostic studies:   Ct Chest Wo Contrast  2015-04-24   CLINICAL DATA:  History of metastatic thyroid cancer  EXAM: CT CHEST WITHOUT CONTRAST  TECHNIQUE: Multidetector CT imaging of the chest was performed following the standard protocol without IV contrast.  COMPARISON:  12/24/2014  FINDINGS: The lungs are well aerated bilaterally. The moderate right-sided pleural effusion is again identified stable from the previous exam. No significant left pleural effusion is seen. There are multiple too-numerous-to-count pulmonary mass is identified bilaterally. Largest of these on the left measures approximately 3.4 cm and is stable in size from the prior exam. Conglomeration of nodules is noted centrally. No significant hilar or mediastinal adenopathy is noted.  The bony structures show multiple areas of metastatic disease. An expansile lytic lesion is noted within the scapula on the right laterally just below the glenoid and extending into the inferior aspect of the glenoid. Additionally an expansile lesion is noted within the right anterior chest wall. It measures approximately 5.8 cm in greatest dimension and  is slightly enlarged from the prior exam. Multiple spinal lesions are seen. The areas of worst lytic destruction are noted in the left vertebral body at T10 as well as the left half  of the vertebral body at T12 and within the pedicle and lamina and extending laterally from the L1. The changes at L1 show encroachment and destruction of the posterior wall of the spinal canal although no definitive soft tissue component is seen within the canal. The lack of IV contrast somewhat limits evaluation. The remainder the upper abdomen is within normal limits with the exception of metastatic disease of the left adrenal gland. The adrenal nodule measures 2.7 cm increased from 1.9 cm on the prior exam.  IMPRESSION: Changes consistent with the given clinical history of metastatic thyroid cancer to the lungs and bones as well as the left adrenal gland. There is been some slight increase in the degree of bony abnormalities. The lung abnormalities are roughly stable from the prior exam.  Stable right-sided effusion.   Electronically Signed   By: Inez Catalina M.D.   On: 04/04/2015 14:16   Dg Chest Port 1 View  04/01/2015   CLINICAL DATA:  Left clavicle and shoulder pain. Thyroid cancer with metastatic bone disease.  EXAM: PORTABLE CHEST - 1 VIEW  COMPARISON:  CT chest 12/24/2014.  FINDINGS: The heart size is normal. Multiple bilateral pulmonary nodules are present bilaterally compatible with known metastatic disease. There has been some progression since the prior study. No focal airspace consolidation is evident otherwise.  Rightward curvature is present in the lower thoracic spine due to collapse pathologic left-sided fracture, unchanged. An erosive lesion is again noted in the right scapula. No definite new lesions are evident.  IMPRESSION: 1. Progression of numerous pulmonary nodules compatible with known metastatic disease. 2. Rightward curvature of the thoracic spine is secondary to asymmetric left-sided pathologic fractures, unchanged. 3. Stable erosive lesion in the right scapula.   Electronically Signed   By: San Morelle M.D.   On: 04/01/2015 16:19     Medical Consultants:     Radiation Oncology: [name], MD  Psychiatry: Ambrose Finland, MD  Oncology: Chauncey Cruel, MD  Anti-Infectives:    None.  Subjective:   Frank Ward is reporting left shoulder pain and is resistant to discussing a reasonable discharge plan. He tells me he has an apartment, but there is no furniture in it. He tells me he can live with family until the apartment is furnished, but he has been intermittently homeless and there have not been any family present except for brother with whom he had a disagreement with per nursing staff. He is alternatingly accusatory of his physicians for withholding treatment, then nsisting that he be allowed to remain in the hospital for completion of his radiation treatments. Attempts to discuss his CODE STATUS are met with anger and frank denial.  Objective:    Filed Vitals:   04/07/15 2051 04/08/15 0536 04/08/15 2218 04/09/15 0313  BP: 107/69 107/69 114/67 104/55  Pulse: 98 107 94 96  Temp: 98 F (36.7 C) 98.1 F (36.7 C) 98.3 F (36.8 C) 97.2 F (36.2 C)  TempSrc: Oral Oral Oral Oral  Resp: '18 18 20 20  ' Height:      SpO2: 96% 97% 98% 100%   No intake or output data in the 24 hours ending 04/09/15 0736  Exam: Gen: NAD Psych: Angry affect with some paranoid thoughts (ie doctors are withholding treatment from him) Cardiovascular:  Tachycardic with  a hyperdynamic precordium, No M/R/G Respiratory:  Lungs diminished Gastrointestinal:  Abdomen soft, NT/ND, + BS Extremities:  No C/E/C   Data Reviewed:    Labs: Basic Metabolic Panel:  Recent Labs Lab 04/08/15 0413  CREATININE 0.74   GFR CrCl cannot be calculated (Unknown ideal weight.).  CBC:  Recent Labs Lab 04/03/15 2229 04/05/15 0422  WBC 5.1 5.7  HGB 9.7* 9.7*  HCT 30.5* 29.5*  MCV 91.6 90.5  PLT 349 368   CBG:  Recent Labs Lab 04/08/15 0827  GLUCAP 92   Microbiology No results found for this or any previous visit (from the past 240  hour(s)).   Medications:   . enoxaparin (LOVENOX) injection  40 mg Subcutaneous Q24H  . feeding supplement (ENSURE ENLIVE)  237 mL Oral TID BM  . levothyroxine  125 mcg Oral QAC breakfast  . morphine  60 mg Oral Q12H  . polyethylene glycol  17 g Oral Daily  . QUEtiapine  100 mg Oral Daily  . senna-docusate  1 tablet Oral BID   Continuous Infusions:   Time spent: 35 minutes with > 50% of time discussing current diagnostic test results, clinical impression and plan of care, explaining options for discharge planning.   LOS: 8 days   [facility] Pager [phone]. If unable to reach me by pager, please call my cell phone at [phone].  *Please refer to [email or web address], password [email or web address] to get updated schedule on who will round on this patient, as hospitalists switch teams weekly. If 7PM-7AM, please contact night-coverage at [email or web address], password [email or web address] for any overnight needs.  04/09/2015, 7:36 AM

## 2015-04-10 ENCOUNTER — Encounter: Payer: Self-pay | Admitting: *Deleted

## 2015-04-10 DIAGNOSIS — K5901 Slow transit constipation: Secondary | ICD-10-CM

## 2015-04-10 LAB — T4, FREE: FREE T4: 0.54 ng/dL — AB (ref 0.61–1.12)

## 2015-04-10 LAB — TSH: TSH: 13.883 u[IU]/mL — AB (ref 0.350–4.500)

## 2015-04-10 MED ORDER — LEVOTHYROXINE SODIUM 125 MCG PO TABS
125.0000 ug | ORAL_TABLET | Freq: Every day | ORAL | Status: DC
  Filled 2015-04-10: qty 1

## 2015-04-10 MED ORDER — LEVOTHYROXINE SODIUM 150 MCG PO TABS
150.0000 ug | ORAL_TABLET | Freq: Every day | ORAL | Status: DC
  Filled 2015-04-10: qty 1

## 2015-04-10 MED ORDER — VITAMINS A & D EX OINT
TOPICAL_OINTMENT | CUTANEOUS | Status: AC
  Administered 2015-04-10: 04:00:00
  Filled 2015-04-10: qty 5

## 2015-04-10 MED ORDER — VITAMINS A & D EX OINT
TOPICAL_OINTMENT | CUTANEOUS | Status: AC
  Administered 2015-04-10: 05:00:00
  Filled 2015-04-10: qty 5

## 2015-04-10 NOTE — Progress Notes (Addendum)
Progress Note   Frank Ward LZJ:[id number] DOB: May 31, 1971 DOA: 04/01/2015 PCP: No PCP Per Patient   Brief Narrative:   Frank Ward is an 44 y.o. 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 Patient underwent psychiatric evaluation 04/07/15 secondary to refusal of radiation treatments and was diagnosed with adjustment disorder with mixed anxiety and depressed mood. He was felt to have capacity to make his own medical decisions.  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 - Has been noncompliant with treatment planning as an outpatient with failure to follow-up on multiple occasions. - Continue scheduled MS Contin and Toradol. Continue Dilaudid as needed. - Pain control efforts limited by noncompliance with completing radiation treatments as refusal of oral pain medications. - Refuses to consider a PCA for better pain control. - Spoke with Dr. Sondra Come 04/09/15 regarding possibility of treatment with radioactive iodine.  - TSH elevated. Needs to be greater than 34 radioactive iodine treatment. Hold Synthroid.  Active Problems:   Hypothyroidism - Holding Synthroid as noted above.    Opiod-induced constipation - Continue Senokot, Colace and MiraLAX.    Severe protein calorie malnutrition with cachexia - Continue Ensure supplements. Dietitian following. Burnis Medin get updated weight per dietitian recommendations.    Noncompliance - Related to underlying depression/anxiety and loss of control  in this young man facing a terminal diagnosis. - Status post psychiatry evaluation where he was found to have capacity.    Adjustment disorder with mixed anxiety and depressed mood - Continue Seroquel (unfortunately, refuses), and when necessary Ativan and trazodone.    Anemia in neoplastic disease - No current indication for transfusion.    DVT Prophylaxis - Lovenox ordered but has been refusing to take it.  Family Communication: No family present at the bedside. Disposition Plan: Home versus SNF or residential hospice when stable and pain controlled.  Will be challenging to discharge him to a safe environment given his mental health issues and lack of family support. Code Status:     Code Status Orders        Start     Ordered   04/01/15 1930  Full code   Continuous     04/01/15 1929        IV Access:    Peripheral IV   Procedures and diagnostic studies:   Ct Chest Wo Contrast  04/11/15   CLINICAL DATA:  History of metastatic thyroid cancer  EXAM: CT CHEST WITHOUT CONTRAST  TECHNIQUE: Multidetector CT imaging of the chest was performed following the standard protocol without IV contrast.  COMPARISON:  12/24/2014  FINDINGS: The lungs are well aerated bilaterally. The moderate right-sided pleural effusion is again identified stable from the previous exam. No significant left pleural effusion is seen. There are multiple too-numerous-to-count pulmonary mass is identified bilaterally. Largest of these on the left measures approximately 3.4 cm and is stable in size from the prior exam. Conglomeration of nodules is noted centrally. No significant hilar or mediastinal adenopathy is noted.  The bony structures show multiple areas of metastatic disease. An expansile lytic lesion is noted within the scapula  on the right laterally just below the glenoid and extending into the inferior aspect of the glenoid. Additionally an expansile lesion is noted within the right anterior chest wall.  It measures approximately 5.8 cm in greatest dimension and is slightly enlarged from the prior exam. Multiple spinal lesions are seen. The areas of worst lytic destruction are noted in the left vertebral body at T10 as well as the left half of the vertebral body at T12 and within the pedicle and lamina and extending laterally from the L1. The changes at L1 show encroachment and destruction of the posterior wall of the spinal canal although no definitive soft tissue component is seen within the canal. The lack of IV contrast somewhat limits evaluation. The remainder the upper abdomen is within normal limits with the exception of metastatic disease of the left adrenal gland. The adrenal nodule measures 2.7 cm increased from 1.9 cm on the prior exam.  IMPRESSION: Changes consistent with the given clinical history of metastatic thyroid cancer to the lungs and bones as well as the left adrenal gland. There is been some slight increase in the degree of bony abnormalities. The lung abnormalities are roughly stable from the prior exam.  Stable right-sided effusion.   Electronically Signed   By: Inez Catalina M.D.   On: 04/04/2015 14:16   Dg Chest Port 1 View  04/01/2015   CLINICAL DATA:  Left clavicle and shoulder pain. Thyroid cancer with metastatic bone disease.  EXAM: PORTABLE CHEST - 1 VIEW  COMPARISON:  CT chest 12/24/2014.  FINDINGS: The heart size is normal. Multiple bilateral pulmonary nodules are present bilaterally compatible with known metastatic disease. There has been some progression since the prior study. No focal airspace consolidation is evident otherwise.  Rightward curvature is present in the lower thoracic spine due to collapse pathologic left-sided fracture, unchanged. An erosive lesion is again noted in the right scapula. No definite new lesions are evident.  IMPRESSION: 1. Progression of numerous pulmonary nodules compatible with known metastatic disease. 2. Rightward curvature of the thoracic spine  is secondary to asymmetric left-sided pathologic fractures, unchanged. 3. Stable erosive lesion in the right scapula.   Electronically Signed   By: San Morelle M.D.   On: 04/01/2015 16:19     Medical Consultants:    Radiation Oncology: [name], MD  Psychiatry: Ambrose Finland, MD  Oncology: Chauncey Cruel, MD  Anti-Infectives:    None.  Subjective:   Frank Ward is minimally verbal and will not speak to me. He asked me to come back later.  Objective:    Filed Vitals:   04/09/15 0313 04/09/15 1359 04/09/15 2145 04/10/15 0500  BP: 104/55 94/56 96/61  106/65  Pulse: 96 89 88 93  Temp: 97.2 F (36.2 C) 97.4 F (36.3 C) 97.4 F (36.3 C) 98.5 F (36.9 C)  TempSrc: Oral Oral Oral Oral  Resp: 20 18 18 18   Height:      SpO2: 100% 98% 98% 98%    Intake/Output Summary (Last 24 hours) at 04/10/15 0757 Last data filed at 04/10/15 0530  Gross per 24 hour  Intake    990 ml  Output    200 ml  Net    790 ml    Exam: Gen: NAD Psych: Withdrawn Cardiovascular:  RRR, No M/R/G Respiratory:  Lungs diminished Gastrointestinal:  Abdomen soft, NT/ND, + BS Extremities:  No C/E/C   Data Reviewed:    Labs: Basic Metabolic Panel:  Recent Labs Lab 04/08/15 0413  CREATININE  0.74   GFR CrCl cannot be calculated (Unknown ideal weight.).  CBC:  Recent Labs Lab 04/03/15 2229 04/05/15 0422  WBC 5.1 5.7  HGB 9.7* 9.7*  HCT 30.5* 29.5*  MCV 91.6 90.5  PLT 349 368   CBG:  Recent Labs Lab 04/08/15 0827  GLUCAP 92   Microbiology No results found for this or any previous visit (from the past 240 hour(s)).   Medications:   . enoxaparin (LOVENOX) injection  40 mg Subcutaneous Q24H  . feeding supplement (ENSURE ENLIVE)  237 mL Oral TID BM  . levothyroxine  125 mcg Oral QAC breakfast  . morphine  60 mg Oral Q12H  . polyethylene glycol  17 g Oral BID  . QUEtiapine  100 mg Oral Daily  . senna-docusate  1 tablet Oral BID  .  vitamin A & D      . vitamin A & D       Continuous Infusions:   Time spent: 25 minutes.   LOS: 9 days   [facility] Pager [phone]. If unable to reach me by pager, please call my cell phone at [phone].  *Please refer to [email or web address], password [email or web address] to get updated schedule on who will round on this patient, as hospitalists switch teams weekly. If 7PM-7AM, please contact night-coverage at [email or web address], password [email or web address] for any overnight needs.  04/10/2015, 7:57 AM

## 2015-04-10 NOTE — Progress Notes (Signed)
[facility] Work  Clinical Social Work met with pt in his room at [facility] to check in and reassess needs. Pt open to meeting with CSW, but seemed to have some difficulty focusing on our discussion. He shared his vision was bothering him this morning. CSW inquired as to what his plans were and what the medical team had shared with him. Pt was aware that he was finished with radiation to his shoulder, but felt "more radiation would help the other shoulder as well". He seemed perplexed as to what may happen next, could only think of today. CSW mentioned to pt that he had completed his HCPOA paperwork last spring and that if he needed help in making some of those decisions, he had some "helpers" listed. Pt not ready to get other family involved, per his report. CSW feels this idea may need to be revisited as dispo continues to evolve.    He discussed how the move to his new apartment was delayed as his furniture was in a storage unit and not currently available. He had been staying with his mother and brother as a result. Pt expressed he thought he "needed to go to rehab" in order to get stronger to get moved to his apartment. CSW briefly explained that the first step is to work with PT and see what his needs might be. Pt did agree that he can't be home alone and needs help. CSW submitted PCS form last week, but this can take several weeks to start. Pt may be open to home based palliative care as well. CSW will check in on pt again next week. [place] [facility] CSW team very familiar with pt and complex social issues.    Clinical Social Work interventions: Emotional support  Loren Racer, [place] Social Worker [place]  [place] Phone: [phone] Fax: [phone]

## 2015-04-10 NOTE — Care Management Note (Signed)
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      Action/Plan: From home alone  Expected Discharge Date:   (unknown)               Expected Discharge Plan:  [place]  In-House Referral:     Discharge planning Services  CM Consult  Post Acute Care Choice:  Home Health Choice offered to:  Patient  DME Arranged:    DME Agency:     HH Arranged:  RN, PT, Nurse's Aide [place] Agency:  [facility]  Status of Service:  In process, will continue to follow  Medicare Important Message Given:  Yes-second notification given Date Medicare IM Given:    Medicare IM give by:    Date Additional Medicare IM Given:    Additional Medicare Important Message give by:     If discussed at [place] of Stay Meetings, dates discussed:    Additional Comments: This CM spoke with pt at length at bedside about disposition planning. DC planning difficult with this pt as he has limited family/friends resources and has been difficult with compliance in the past.  At this time pt states that he is interested in [facility] services to assist him at home. Pt states he would like a nurse to check on him and states that his SW at [facility] has been helping him apply for aide services through his medicaid as well. Pt offered choice for [facility] services and chose [facility]. [facility] rep given referral. Pt encouraged to work with Physical Therapy here in the hospital so that he could be evaluated for any equipment or services that he could use at home. Pt states he will work with them now. Staff RN alerted that Pt has agreed to work with PT and will need new order as PT discharged him previously. Pt was pleasant during conversation. CM will continue to follow. Lynnell Catalan, RN 04/10/2015, 9:48 AM

## 2015-04-10 NOTE — Care Management Important Message (Signed)
Important Message  Patient Details  Name: Frank Ward MRN: [id number] Date of Birth: 1971-06-14   Medicare Important Message Given:  Yes-third notification given    [name] 04/10/2015, 1:20 [place] Message  Patient Details  Name: Frank Ward MRN: [id number] Date of Birth: February 21, 1971   Medicare Important Message Given:  Yes-third notification given    [name] 04/10/2015, 1:20 PM

## 2015-04-10 NOTE — Progress Notes (Signed)
Chaplain attempted to visit patient during rounds Patient was sleeping and chaplain did not disturb him. Chaplain services are available upon request.   04/10/15 1300  Clinical Encounter Type  Visited With Patient  Visit Type Initial

## 2015-04-11 NOTE — Progress Notes (Signed)
Frank Ward was lying in bed when I arrived. He immediately said he was not feeling well today and asked for a return visit tomorrow.  Please page if support is needed prior to next visit. [place]   04/11/15 1900  Clinical Encounter Type  Visited With Patient

## 2015-04-11 NOTE — Progress Notes (Signed)
Progress Note   Frank Ward QIH:[id number] DOB: [birth year] DOA: 04/01/2015 PCP: No PCP Per Patient   Brief Narrative:   Frank Ward is an 44 y.o. 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 Patient underwent psychiatric evaluation 04/07/15 secondary to refusal of radiation treatments and was diagnosed with adjustment disorder with mixed anxiety and depressed mood. He was felt to have capacity to make his own medical decisions.  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 - Has been noncompliant with treatment planning as an outpatient with failure to follow-up on multiple occasions. - Continue scheduled MS Contin and Toradol. Continue Dilaudid as needed. - Pain control efforts limited by noncompliance with completing radiation treatments as refusal of oral pain medications. - Refuses to consider a PCA for better pain control. - Spoke with Dr. Sondra Come 04/09/15 regarding possibility of treatment with radioactive iodine.  - TSH elevated. Needs to be greater than 30 for radioactive iodine treatment. Hold Synthroid. Repeat TSH 04/13/15.  Active Problems:   Hypothyroidism - Holding Synthroid as noted above.    Opiod-induced constipation - Continue Senokot, Colace and MiraLAX.    Severe protein calorie malnutrition with cachexia - Continue Ensure supplements. Dietitian following. - Current weight 130 pounds (59.24 kg), an 8 pound weight loss from 03/03/15.    Noncompliance - Related to  underlying depression/anxiety and loss of control in this young man facing a terminal diagnosis. - Status post psychiatry evaluation where he was found to have capacity.    Adjustment disorder with mixed anxiety and depressed mood - Continue Seroquel (unfortunately, refuses), and when necessary Ativan and trazodone.    Anemia in neoplastic disease - No current indication for transfusion.    DVT Prophylaxis - Lovenox ordered but has been refusing to take it.  Family Communication: No family present at the bedside. Declines my offer to call. Disposition Plan: Home versus SNF or residential hospice when stable and pain controlled.  Will be challenging to discharge him to a safe environment given his mental health issues and lack of family support (although he does have a mother and a brother that live locally). Code Status:     Code Status Orders        Start     Ordered   04/01/15 1930  Full code   Continuous     04/01/15 1929      IV Access:    Peripheral IV   Procedures and diagnostic studies:   Ct Chest Wo Contrast  04-10-2015   CLINICAL DATA:  History of metastatic thyroid cancer  EXAM: CT CHEST WITHOUT CONTRAST  TECHNIQUE: Multidetector CT imaging of the chest was performed following the standard protocol without IV contrast.  COMPARISON:  12/24/2014  FINDINGS: The lungs are well aerated bilaterally. The moderate right-sided pleural effusion is again identified stable from the previous exam. No significant left pleural effusion is seen. There are multiple too-numerous-to-count pulmonary mass is identified bilaterally. Largest of these on the left measures approximately 3.4 cm and is stable in size from the prior exam. Conglomeration of nodules is noted centrally. No significant  hilar or mediastinal adenopathy is noted.  The bony structures show multiple areas of metastatic disease. An expansile lytic lesion is noted within the scapula on the right laterally just below the  glenoid and extending into the inferior aspect of the glenoid. Additionally an expansile lesion is noted within the right anterior chest wall. It measures approximately 5.8 cm in greatest dimension and is slightly enlarged from the prior exam. Multiple spinal lesions are seen. The areas of worst lytic destruction are noted in the left vertebral body at T10 as well as the left half of the vertebral body at T12 and within the pedicle and lamina and extending laterally from the L1. The changes at L1 show encroachment and destruction of the posterior wall of the spinal canal although no definitive soft tissue component is seen within the canal. The lack of IV contrast somewhat limits evaluation. The remainder the upper abdomen is within normal limits with the exception of metastatic disease of the left adrenal gland. The adrenal nodule measures 2.7 cm increased from 1.9 cm on the prior exam.  IMPRESSION: Changes consistent with the given clinical history of metastatic thyroid cancer to the lungs and bones as well as the left adrenal gland. There is been some slight increase in the degree of bony abnormalities. The lung abnormalities are roughly stable from the prior exam.  Stable right-sided effusion.   Electronically Signed   By: Inez Catalina M.D.   On: 04/04/2015 14:16   Dg Chest Port 1 View  04/01/2015   CLINICAL DATA:  Left clavicle and shoulder pain. Thyroid cancer with metastatic bone disease.  EXAM: PORTABLE CHEST - 1 VIEW  COMPARISON:  CT chest 12/24/2014.  FINDINGS: The heart size is normal. Multiple bilateral pulmonary nodules are present bilaterally compatible with known metastatic disease. There has been some progression since the prior study. No focal airspace consolidation is evident otherwise.  Rightward curvature is present in the lower thoracic spine due to collapse pathologic left-sided fracture, unchanged. An erosive lesion is again noted in the right scapula. No definite new lesions are evident.   IMPRESSION: 1. Progression of numerous pulmonary nodules compatible with known metastatic disease. 2. Rightward curvature of the thoracic spine is secondary to asymmetric left-sided pathologic fractures, unchanged. 3. Stable erosive lesion in the right scapula.   Electronically Signed   By: San Morelle M.D.   On: 04/01/2015 16:19     Medical Consultants:    Radiation Oncology: [name], MD  Psychiatry: Ambrose Finland, MD  Oncology: Chauncey Cruel, MD  Anti-Infectives:    None.  Subjective:   Frank Ward is depressed and sad. He continues to voice frustration with his treatment here and expresses the opinion that his physicians are withholding treatment from him. States "I know I'm going to die "and also "I still have fight in me ". Pain appears to be controlled with current medication regimen although he continues to refuse oral medications.  Objective:    Filed Vitals:   04/10/15 0839 04/10/15 1500 04/10/15 2052 04/11/15 0536  BP:  116/74 112/68 108/69  Pulse:  92 77   Temp:  98.5 F (36.9 C) 97.6 F (36.4 C) 98 F (36.7 C)  TempSrc:  Oral Oral Oral  Resp:  18 20 18   Height:      Weight: 59.24 kg (130 lb 9.6 oz)     SpO2:  100% 97% 96%    Intake/Output Summary (Last 24 hours) at 04/11/15 0827 Last data filed at 04/11/15  5462  Gross per 24 hour  Intake      0 ml  Output    670 ml  Net   -670 ml    Exam: Gen: NAD Psych: Depressed affect Cardiovascular:  RRR, No M/R/G Respiratory:  Lungs diminished Gastrointestinal:  Abdomen soft, NT/ND, + BS Extremities:  No C/E/C   Data Reviewed:    Labs: Basic Metabolic Panel:  Recent Labs Lab 04/08/15 0413  CREATININE 0.74   GFR Estimated Creatinine Clearance: 96.6 mL/min (by C-G formula based on Cr of 0.74).  CBC:  Recent Labs Lab 04/05/15 0422  WBC 5.7  HGB 9.7*  HCT 29.5*  MCV 90.5  PLT 368   CBG:  Recent Labs Lab 04/08/15 0827  GLUCAP 92    Microbiology No results found for this or any previous visit (from the past 240 hour(s)).   Medications:   . enoxaparin (LOVENOX) injection  40 mg Subcutaneous Q24H  . feeding supplement (ENSURE ENLIVE)  237 mL Oral TID BM  . morphine  60 mg Oral Q12H  . polyethylene glycol  17 g Oral BID  . QUEtiapine  100 mg Oral Daily  . senna-docusate  1 tablet Oral BID   Continuous Infusions:   Time spent: 25 minutes.   LOS: 10 days   [facility] Pager [phone]. If unable to reach me by pager, please call my cell phone at [phone].  *Please refer to [email or web address], password [email or web address] to get updated schedule on who will round on this patient, as hospitalists switch teams weekly. If 7PM-7AM, please contact night-coverage at [email or web address], password [email or web address] for any overnight needs.  04/11/2015, 8:27 AM

## 2015-04-11 NOTE — Evaluation (Signed)
Physical Therapy Evaluation Patient Details Name: Frank Ward MRN: [id number] DOB: [birth year] Today's Date: 04/11/2015   History of Present Illness  Frank Ward is an 43 y.o. 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 Patient underwent psychiatric evaluation 04/07/15 secondary to refusal of radiation treatments and was diagnosed with adjustment disorder with mixed anxiety and depressed mood. He was felt to have capacity to make his own medical decisions.  Clinical Impression  Pt admitted with above diagnosis. Pt currently with functional limitations due to the deficits listed below (see PT Problem List).  Pt will benefit from skilled PT to increase their independence and safety with mobility to allow discharge to the venue listed below.  Pt worked with PT minimally and transferred to chair with MIN A.  Pt has had hx of non-compliance with PT and will do PT trial and see if pt will work with PT on consistent basis.  Pt wants to go home to his apartment, but this may be an unrealistic expectation depending on medical status at time of d/c.  Will need 24 hour S.     Follow Up Recommendations Supervision/Assistance - 24 hour    Equipment Recommendations  Other (comment) (TBD)    Recommendations for Other Services       Precautions / Restrictions Precautions Precaution Comments: Metastatic CA      Mobility  Bed Mobility Overal bed mobility: Needs Assistance Bed Mobility: Supine to Sit     Supine to sit: Supervision        Transfers Overall transfer level: Needs assistance   Transfers: Stand Pivot Transfers;Sit to/from Stand Sit to Stand: Min assist Stand pivot transfers: Min assist       General transfer comment: Pt required MIN A to power up to  stand and MIN A due to unsteadiness on his feet with SPT.  Pt reported head felt funny while standing.  Pt reports he has not been up much.  Ambulation/Gait                Stairs            Wheelchair Mobility    Modified Rankin (Stroke Patients Only)       Balance Overall balance assessment: Needs assistance   Sitting balance-Leahy Scale: Good       Standing balance-Leahy Scale: Fair                               Pertinent Vitals/Pain Pain Assessment: 0-10 Pain Score: 5  Pain Location: abdomen Pain Descriptors / Indicators: Aching;Sore Pain Intervention(s): Premedicated before session    [place] expects to be discharged to:: Private residence Living Arrangements: Alone Available Help at Discharge: Family;Available PRN/intermittently Type of Home: Apartment Home Access: Level entry     Home Layout: One level Home Equipment: Cane - single point      Prior Function Level of Independence: Independent with assistive device(s)         Comments: used cane     Hand Dominance   Dominant Hand: Right    Extremity/Trunk Assessment   Upper Extremity Assessment: Generalized weakness           Lower Extremity Assessment: Generalized weakness      Cervical / Trunk Assessment: Other exceptions  Communication   Communication: No difficulties  Cognition Arousal/Alertness: Awake/alert Behavior During Therapy: Flat affect Overall Cognitive Status: Within Functional Limits for tasks assessed                      General Comments General comments (skin integrity, edema, etc.): Pt required max encouragement and wanted to request pain meds although he just got pain meds 15 mins prior to treatment session.  K pad in room.    Exercises General Exercises - Lower Extremity Ankle Circles/Pumps: Both;10 reps;AROM Long Arc Quad: Both;5 reps;Seated;Strengthening Hip ABduction/ADduction: Strengthening;5  reps;Both;Seated Hip Flexion/Marching: Strengthening;Both;5 reps;Seated      Assessment/Plan    PT Assessment Patient needs continued PT services  PT Diagnosis Difficulty walking;Generalized weakness;Acute pain   PT Problem List Decreased strength;Decreased balance;Decreased mobility;Pain  PT Treatment Interventions Gait training;Functional mobility training;Therapeutic activities;Therapeutic exercise;Balance training   PT Goals (Current goals can be found in the Care Plan section) Acute Rehab PT Goals Patient Stated Goal: go home. PT Goal Formulation: With patient Time For Goal Achievement: 04/25/15 Potential to Achieve Goals: Fair    Frequency Min 2X/week   Barriers to discharge        Co-evaluation               End of Session Equipment Utilized During Treatment: Cervical collar Activity Tolerance: Patient limited by fatigue Patient left: in chair;with call bell/phone within reach;Other (comment) Nurse Communication: Mobility status;Patient requests pain meds         Time: 8676-1950 PT Time Calculation (min) (ACUTE ONLY): 17 min   Charges:   PT Evaluation $Initial PT Evaluation Tier I: 1 Procedure     PT G Codes:        Hubbert Landrigan LUBECK 04/11/2015, 4:23 PM

## 2015-04-11 NOTE — Progress Notes (Signed)
Patient refused to have IV dressing changed.

## 2015-04-12 DIAGNOSIS — H9311 Tinnitus, right ear: Secondary | ICD-10-CM | POA: Diagnosis present

## 2015-04-12 DIAGNOSIS — H532 Diplopia: Secondary | ICD-10-CM

## 2015-04-12 MED ORDER — HYDROMORPHONE HCL 2 MG/ML IJ SOLN
2.0000 mg | INTRAMUSCULAR | Status: DC | PRN
  Administered 2015-04-12: 3 mg via INTRAVENOUS
  Administered 2015-04-12: 2 mg via INTRAVENOUS
  Administered 2015-04-12 (×2): 3 mg via INTRAVENOUS
  Administered 2015-04-13: 2 mg via INTRAVENOUS
  Administered 2015-04-13: 3 mg via INTRAVENOUS
  Administered 2015-04-13 (×7): 2 mg via INTRAVENOUS
  Administered 2015-04-13 (×2): 3 mg via INTRAVENOUS
  Administered 2015-04-14 (×8): 2 mg via INTRAVENOUS
  Filled 2015-04-12 (×3): qty 1
  Filled 2015-04-12: qty 2
  Filled 2015-04-12 (×4): qty 1
  Filled 2015-04-12: qty 2
  Filled 2015-04-12: qty 1
  Filled 2015-04-12: qty 2
  Filled 2015-04-12 (×3): qty 1
  Filled 2015-04-12: qty 2
  Filled 2015-04-12 (×3): qty 1
  Filled 2015-04-12 (×2): qty 2
  Filled 2015-04-12 (×3): qty 1

## 2015-04-12 NOTE — Progress Notes (Signed)
Mr. Denzer said he was not feeling good again today. He was lying in bed with low lighting. He was appreciative of visit, but said he just wasn't feeling well. Chaplain Marlise Eves Holder   04/12/15 1900  Clinical Encounter Type  Visited With Patient

## 2015-04-12 NOTE — Progress Notes (Signed)
Progress Note   Frank Ward OEU:[id number] DOB: [birth year] DOA: 04/01/2015 PCP: No PCP Per Patient   Brief Narrative:   Frank Ward is an 44 y.o. 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 Patient underwent psychiatric evaluation 04/07/15 secondary to refusal of radiation treatments and was diagnosed with adjustment disorder with mixed anxiety and depressed mood. He was felt to have capacity to make his own medical decisions.  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 - Has been noncompliant with treatment planning as an outpatient with failure to follow-up on multiple occasions. - Continue scheduled MS Contin and Toradol. Continue Dilaudid as needed. - Pain control efforts limited by noncompliance with completing radiation treatments as refusal of oral pain medications. - Refuses to consider a PCA for better pain control. - Spoke with Dr. Sondra Come 04/09/15 regarding possibility of treatment with radioactive iodine.  - TSH elevated. Needs to be greater than 30 for radioactive iodine treatment. Hold Synthroid. Repeat TSH 04/13/15.  Active Problems:   Diplopia and tinnitis - Reports a 1 week history of diplopia and a ringing in his right ear (but didn't tell anyone). - MRI brain ordered.    Hypothyroidism - Holding Synthroid as noted above.    Opiod-induced constipation - Continue Senokot, Colace and MiraLAX.    Severe protein calorie malnutrition with cachexia - Continue Ensure  supplements. Dietitian following. - Current weight 130 pounds (59.24 kg), an 8 pound weight loss from 03/03/15.    Noncompliance - Related to underlying depression/anxiety and loss of control in this young man facing a terminal diagnosis. - Status post psychiatry evaluation where he was found to have capacity.    Adjustment disorder with mixed anxiety and depressed mood - Continue Seroquel (unfortunately, refuses), and when necessary Ativan and trazodone.    Anemia in neoplastic disease - No current indication for transfusion.    DVT Prophylaxis - Lovenox ordered but has been refusing to take it.  Family Communication: No family present at the bedside. Declines my offer to call. Disposition Plan: Home versus SNF or residential hospice when stable and pain controlled.  Will be challenging to discharge him to a safe environment given his mental health issues and lack of family support (although he does have a mother and a brother that live locally). Code Status:     Code Status Orders        Start     Ordered   04/01/15 1930  Full code   Continuous     04/01/15 1929      IV Access:    Peripheral IV   Procedures and diagnostic studies:   Ct Chest Wo Contrast  2015/05/03   CLINICAL DATA:  History of metastatic thyroid cancer  EXAM: CT CHEST WITHOUT CONTRAST  TECHNIQUE: Multidetector CT imaging of the chest was performed following the standard protocol without IV contrast.  COMPARISON:  12/24/2014  FINDINGS: The lungs are well aerated bilaterally. The moderate right-sided pleural effusion is again identified stable from the previous exam. No significant left pleural effusion is seen. There are multiple too-numerous-to-count pulmonary mass is  identified bilaterally. Largest of these on the left measures approximately 3.4 cm and is stable in size from the prior exam. Conglomeration of nodules is noted centrally. No significant hilar or mediastinal adenopathy is noted.  The bony  structures show multiple areas of metastatic disease. An expansile lytic lesion is noted within the scapula on the right laterally just below the glenoid and extending into the inferior aspect of the glenoid. Additionally an expansile lesion is noted within the right anterior chest wall. It measures approximately 5.8 cm in greatest dimension and is slightly enlarged from the prior exam. Multiple spinal lesions are seen. The areas of worst lytic destruction are noted in the left vertebral body at T10 as well as the left half of the vertebral body at T12 and within the pedicle and lamina and extending laterally from the L1. The changes at L1 show encroachment and destruction of the posterior wall of the spinal canal although no definitive soft tissue component is seen within the canal. The lack of IV contrast somewhat limits evaluation. The remainder the upper abdomen is within normal limits with the exception of metastatic disease of the left adrenal gland. The adrenal nodule measures 2.7 cm increased from 1.9 cm on the prior exam.  IMPRESSION: Changes consistent with the given clinical history of metastatic thyroid cancer to the lungs and bones as well as the left adrenal gland. There is been some slight increase in the degree of bony abnormalities. The lung abnormalities are roughly stable from the prior exam.  Stable right-sided effusion.   Electronically Signed   By: Inez Catalina M.D.   On: 04/04/2015 14:16   Dg Chest Port 1 View  04/01/2015   CLINICAL DATA:  Left clavicle and shoulder pain. Thyroid cancer with metastatic bone disease.  EXAM: PORTABLE CHEST - 1 VIEW  COMPARISON:  CT chest 12/24/2014.  FINDINGS: The heart size is normal. Multiple bilateral pulmonary nodules are present bilaterally compatible with known metastatic disease. There has been some progression since the prior study. No focal airspace consolidation is evident otherwise.  Rightward curvature is present in the lower thoracic spine due to  collapse pathologic left-sided fracture, unchanged. An erosive lesion is again noted in the right scapula. No definite new lesions are evident.  IMPRESSION: 1. Progression of numerous pulmonary nodules compatible with known metastatic disease. 2. Rightward curvature of the thoracic spine is secondary to asymmetric left-sided pathologic fractures, unchanged. 3. Stable erosive lesion in the right scapula.   Electronically Signed   By: San Morelle M.D.   On: 04/01/2015 16:19     Medical Consultants:    Radiation Oncology: [name], MD  Psychiatry: Ambrose Finland, MD  Oncology: Chauncey Cruel, MD  Anti-Infectives:    None.  Subjective:   Deshan Karen Kinnard tells me he has had double vision for one week and now has right sided tinnitus.  Says "But I didn't tell anyone".  Objective:    Filed Vitals:   04/11/15 0536 04/11/15 1300 04/11/15 2017 04/12/15 0533  BP: 108/69 110/71 98/60 100/54  Pulse:  82 92 98  Temp: 98 F (36.7 C) 98.1 F (36.7 C) 97.9 F (36.6 C) 98.1 F (36.7 C)  TempSrc: Oral Oral Oral Oral  Resp: 18 18 18 18   Height:      Weight:      SpO2: 96% 97% 100% 98%    Intake/Output Summary (Last 24 hours) at 04/12/15 0738 Last data filed at 04/12/15 0100  Gross per 24 hour  Intake   1200 ml  Output    950 ml  Net    250 ml    Exam: Gen: NAD Psych: Depressed affect Cardiovascular:  RRR, No M/R/G Respiratory:  Lungs diminished Gastrointestinal:  Abdomen soft, NT/ND, + BS Extremities:  No C/E/C   Data Reviewed:    Labs: Basic Metabolic Panel:  Recent Labs Lab 04/08/15 0413  CREATININE 0.74   GFR Estimated Creatinine Clearance: 96.6 mL/min (by C-G formula based on Cr of 0.74).  CBC: No results for input(s): WBC, NEUTROABS, HGB, HCT, MCV, PLT in the last 168 hours. CBG:  Recent Labs Lab 04/08/15 0827  GLUCAP 92   Microbiology No results found for this or any previous visit (from the past 240  hour(s)).   Medications:   . enoxaparin (LOVENOX) injection  40 mg Subcutaneous Q24H  . feeding supplement (ENSURE ENLIVE)  237 mL Oral TID BM  . morphine  60 mg Oral Q12H  . polyethylene glycol  17 g Oral BID  . QUEtiapine  100 mg Oral Daily  . senna-docusate  1 tablet Oral BID   Continuous Infusions:   Time spent: 25 minutes.   LOS: 11 days   [facility] Pager [phone]. If unable to reach me by pager, please call my cell phone at [phone].  *Please refer to [email or web address], password [email or web address] to get updated schedule on who will round on this patient, as hospitalists switch teams weekly. If 7PM-7AM, please contact night-coverage at [email or web address], password [email or web address] for any overnight needs.  04/12/2015, 7:38 AM

## 2015-04-13 ENCOUNTER — Inpatient Hospital Stay (HOSPITAL_COMMUNITY): Payer: Medicare Other

## 2015-04-13 LAB — TSH: TSH: 18.946 u[IU]/mL — ABNORMAL HIGH (ref 0.350–4.500)

## 2015-04-13 LAB — GLUCOSE, CAPILLARY: Glucose-Capillary: 77 mg/dL (ref 65–99)

## 2015-04-13 MED ORDER — GADOBENATE DIMEGLUMINE 529 MG/ML IV SOLN
12.0000 mL | Freq: Once | INTRAVENOUS | Status: AC | PRN
  Administered 2015-04-13: 13 mL via INTRAVENOUS

## 2015-04-13 MED ORDER — DEXAMETHASONE SODIUM PHOSPHATE 4 MG/ML IJ SOLN
4.0000 mg | Freq: Four times a day (QID) | INTRAMUSCULAR | Status: DC
  Administered 2015-04-13 – 2015-04-23 (×35): 4 mg via INTRAVENOUS
  Filled 2015-04-13 (×38): qty 1

## 2015-04-13 NOTE — Progress Notes (Signed)
Progress Note   Frank Ward UYQ:[id number] DOB: 05-10-1971 DOA: 04/01/2015 PCP: No PCP Per Patient   Brief Narrative:   Frank Ward is an 44 y.o. 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 Patient underwent psychiatric evaluation 04/07/15 secondary to refusal of radiation treatments and was diagnosed with adjustment disorder with mixed anxiety and depressed mood. He was felt to have capacity to make his own medical decisions.  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 - Has been noncompliant with treatment planning as an outpatient with failure to follow-up on multiple occasions. - Continue scheduled MS Contin and Toradol. Continue Dilaudid as needed. - Pain control efforts limited by refusal of oral pain medications. Undergoing radiation treatments. - Refuses to consider a PCA for better pain control. - Spoke with Dr. Sondra Come 04/09/15 regarding possibility of treatment with radioactive iodine.  - TSH elevated. Needs to be greater than 30 for radioactive iodine treatment. Hold Synthroid. Repeat TSH 04/13/15 18.9.  Active Problems:   Diplopia and tinnitis - Reports a 1 week history of diplopia and a ringing in his right ear (but didn't tell anyone). - MRI brain ordered and is pending at this time.    Hypothyroidism - Holding Synthroid as noted above.    Opiod-induced constipation - Continue Senokot, Colace and MiraLAX.    Severe protein calorie malnutrition with cachexia - Continue  Ensure supplements. Dietitian following. - Current weight 130 pounds (59.24 kg), an 8 pound weight loss from 03/03/15.    Noncompliance - Related to underlying depression/anxiety and loss of control in this young man facing a terminal diagnosis. - Status post psychiatry evaluation where he was found to have capacity. - Mood remains somewhat labile.    Adjustment disorder with mixed anxiety and depressed mood - Continue Seroquel (unfortunately, refuses), and when necessary Ativan and trazodone.    Anemia in neoplastic disease - No current indication for transfusion.    DVT Prophylaxis - Lovenox ordered but has been refusing to take it.  Family Communication: No family present at the bedside. Declines my offer to call. Disposition Plan: Home versus SNF or residential hospice when stable and pain controlled.  Will be challenging to discharge him to a safe environment given his mental health issues and lack of family support (although he does have a mother and a brother that live locally). Code Status:     Code Status Orders        Start     Ordered   04/01/15 1930  Full code   Continuous     04/01/15 1929      IV Access:    Peripheral IV   Procedures and diagnostic studies:   Ct Chest Wo Contrast  2015/04/24   CLINICAL DATA:  History of metastatic thyroid cancer  EXAM: CT CHEST WITHOUT CONTRAST  TECHNIQUE: Multidetector CT imaging of the chest was performed following the standard protocol without IV contrast.  COMPARISON:  12/24/2014  FINDINGS: The lungs are well aerated bilaterally. The moderate right-sided pleural effusion is again identified stable from the previous exam. No significant left pleural effusion  is seen. There are multiple too-numerous-to-count pulmonary mass is identified bilaterally. Largest of these on the left measures approximately 3.4 cm and is stable in size from the prior exam. Conglomeration of nodules is noted centrally. No significant hilar or  mediastinal adenopathy is noted.  The bony structures show multiple areas of metastatic disease. An expansile lytic lesion is noted within the scapula on the right laterally just below the glenoid and extending into the inferior aspect of the glenoid. Additionally an expansile lesion is noted within the right anterior chest wall. It measures approximately 5.8 cm in greatest dimension and is slightly enlarged from the prior exam. Multiple spinal lesions are seen. The areas of worst lytic destruction are noted in the left vertebral body at T10 as well as the left half of the vertebral body at T12 and within the pedicle and lamina and extending laterally from the L1. The changes at L1 show encroachment and destruction of the posterior wall of the spinal canal although no definitive soft tissue component is seen within the canal. The lack of IV contrast somewhat limits evaluation. The remainder the upper abdomen is within normal limits with the exception of metastatic disease of the left adrenal gland. The adrenal nodule measures 2.7 cm increased from 1.9 cm on the prior exam.  IMPRESSION: Changes consistent with the given clinical history of metastatic thyroid cancer to the lungs and bones as well as the left adrenal gland. There is been some slight increase in the degree of bony abnormalities. The lung abnormalities are roughly stable from the prior exam.  Stable right-sided effusion.   Electronically Signed   By: Inez Catalina M.D.   On: 04/04/2015 14:16   Dg Chest Port 1 View  04/01/2015   CLINICAL DATA:  Left clavicle and shoulder pain. Thyroid cancer with metastatic bone disease.  EXAM: PORTABLE CHEST - 1 VIEW  COMPARISON:  CT chest 12/24/2014.  FINDINGS: The heart size is normal. Multiple bilateral pulmonary nodules are present bilaterally compatible with known metastatic disease. There has been some progression since the prior study. No focal airspace consolidation is evident otherwise.  Rightward curvature is  present in the lower thoracic spine due to collapse pathologic left-sided fracture, unchanged. An erosive lesion is again noted in the right scapula. No definite new lesions are evident.  IMPRESSION: 1. Progression of numerous pulmonary nodules compatible with known metastatic disease. 2. Rightward curvature of the thoracic spine is secondary to asymmetric left-sided pathologic fractures, unchanged. 3. Stable erosive lesion in the right scapula.   Electronically Signed   By: San Morelle M.D.   On: 04/01/2015 16:19     Medical Consultants:    Radiation Oncology: [name], MD  Psychiatry: Ambrose Finland, MD  Oncology: Chauncey Cruel, MD  Anti-Infectives:    None.  Subjective:   Erle Alfonzia Woolum tells me he has ongoing double vision and right sided tinnitus.  Pain is currently controlled. No nausea or vomiting. He tells me his bowels have been moving.  Objective:    Filed Vitals:   04/12/15 0533 04/12/15 1315 04/12/15 1900 04/13/15 0525  BP: 100/54 106/63 98/62 104/62  Pulse: 98 81 84 94  Temp: 98.1 F (36.7 C) 97.9 F (36.6 C) 97.6 F (36.4 C) 98 F (36.7 C)  TempSrc: Oral Oral Oral Oral  Resp: 18 18 17 16   Height:      Weight:      SpO2: 98% 99% 98% 93%    Intake/Output Summary (Last 24 hours) at  04/13/15 0737 Last data filed at 04/12/15 1837  Gross per 24 hour  Intake    240 ml  Output    450 ml  Net   -210 ml    Exam: Gen: NAD Psych: Depressed affect Cardiovascular:  RRR, No M/R/G Respiratory:  Lungs diminished Gastrointestinal:  Abdomen soft, NT/ND, + BS Extremities:  No C/E/C   Data Reviewed:    Labs: Basic Metabolic Panel:  Recent Labs Lab 04/08/15 0413  CREATININE 0.74   GFR Estimated Creatinine Clearance: 96.6 mL/min (by C-G formula based on Cr of 0.74).  CBC: No results for input(s): WBC, NEUTROABS, HGB, HCT, MCV, PLT in the last 168 hours. CBG:  Recent Labs Lab 04/08/15 0827  GLUCAP 92    Microbiology No results found for this or any previous visit (from the past 240 hour(s)).   Medications:   . enoxaparin (LOVENOX) injection  40 mg Subcutaneous Q24H  . feeding supplement (ENSURE ENLIVE)  237 mL Oral TID BM  . morphine  60 mg Oral Q12H  . polyethylene glycol  17 g Oral BID  . QUEtiapine  100 mg Oral Daily  . senna-docusate  1 tablet Oral BID   Continuous Infusions:   Time spent: 25 minutes.   LOS: 12 days   [facility] Pager [phone]. If unable to reach me by pager, please call my cell phone at [phone].  *Please refer to [email or web address], password [email or web address] to get updated schedule on who will round on this patient, as hospitalists switch teams weekly. If 7PM-7AM, please contact night-coverage at [email or web address], password [email or web address] for any overnight needs.  04/13/2015, 7:37 AM

## 2015-04-13 NOTE — Care Management Important Message (Signed)
Important Message  Patient Details  Name: Daylan Juhnke MRN: [id number] Date of Birth: 07-21-1971   Medicare Important Message Given:  Yes-fourth notification given    [name] 04/13/2015, 11:08 AMImportant Message  Patient Details  Name: Nishanth Mccaughan MRN: [id number] Date of Birth: 1971-03-02   Medicare Important Message Given:  Yes-fourth notification given    [name] 04/13/2015, 11:07 AM

## 2015-04-13 NOTE — Progress Notes (Signed)
Patient continues to refuse oral medications. His mood and behavior continues to fluctuate over the course of shift. Has short term memory problems and becomes angry when he cannot remember the time of his last dose of pain medication. The time he last received his medication is written on the white board but patient has problems processing this information.

## 2015-04-14 ENCOUNTER — Telehealth: Payer: Self-pay | Admitting: Oncology

## 2015-04-14 MED ORDER — HYDROMORPHONE HCL 1 MG/ML IJ SOLN
1.0000 mg | INTRAMUSCULAR | Status: AC | PRN
  Administered 2015-04-14: 1 mg via INTRAVENOUS
  Filled 2015-04-14: qty 1

## 2015-04-14 MED ORDER — FENTANYL 25 MCG/HR TD PT72: 25.0000 ug | MEDICATED_PATCH | TRANSDERMAL | Status: DC

## 2015-04-14 MED ORDER — HYDROMORPHONE HCL 4 MG/ML IJ SOLN
2.0000 mg | INTRAMUSCULAR | Status: DC | PRN
  Administered 2015-04-14 (×2): 3 mg via INTRAVENOUS
  Administered 2015-04-15 – 2015-04-16 (×12): 2 mg via INTRAVENOUS
  Filled 2015-04-14 (×14): qty 1

## 2015-04-14 NOTE — Progress Notes (Signed)
Progress Note   Frank Ward YQM:[id number] DOB: 06-Dec-1970 DOA: 04/01/2015 PCP: No PCP Per Patient   Brief Narrative:   Frank Ward is an 44 y.o. 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 Patient underwent psychiatric evaluation 04/07/15 secondary to refusal of radiation treatments and was diagnosed with adjustment disorder with mixed anxiety and depressed mood. He was felt to have capacity to make his own medical decisions. He is currently undergoing ongoing radiation treatments and the radiation oncologist is considering radioactive iodine therapy however his TSH needs to be greater than 30 before this can be done. Synthroid on hold pending further treatment recommendations by radiation oncologist.  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 - Status post thyroidectomy (01/29/13) as well as ablative radioactive iodine. - Has been noncompliant with treatment planning as an outpatient with failure to follow-up on multiple occasions. - Continue Dilaudid as needed. Refuses to consider a PCA for better pain control, or take oral medications . - Pain control efforts limited by refusal of oral pain medications.  - Spoke with Dr. Sondra Come 04/09/15 regarding possibility of treatment with additional  radioactive iodine.  - TSH elevated. Needs to be greater than 30 for radioactive iodine treatment. Hold Synthroid. Repeat TSH 04/13/15 18.9. - Dr. Sondra Come continues to follow him and is considering additional radiation treatments to his left  shoulder area.  Active Problems:   Diplopia and tinnitis - Reports a 1 week history of diplopia and a ringing in his right ear (but didn't tell anyone). - MRI brain shows enlarging metastasis with vasogenic edema. IV Decadron started 04/13/15. - Case discussed with Dr. Sondra Come who will review the images to see if these areas are amenable to any further treatment.    Hypothyroidism - Holding Synthroid as noted above.    Opiod-induced constipation - Continue Senokot, Colace and MiraLAX.    Severe protein calorie malnutrition with cachexia - Continue Ensure supplements. Dietitian following. - Current weight 130 pounds (59.24 kg), an 8 pound weight loss from 03/03/15.    Noncompliance - Related to underlying depression/anxiety and loss of control in this young man facing a terminal diagnosis. - Status post psychiatry evaluation where he was found to have capacity. - Mood remains somewhat labile.    Adjustment disorder with mixed anxiety and depressed mood - Refuses to take Seroquel, continue Ativan and trazodone as needed. - Found to have capacity per psychiatrist.  - With lack of family support, and clearly lacking the ability to care for himself, his discharge will be problematic.     Anemia in neoplastic disease - No current indication for transfusion.    DVT Prophylaxis - Lovenox ordered but has been refusing to take it.  Family Communication: No family present at the bedside. Declines my offer to call. Disposition Plan: Home versus SNF or residential hospice when stable and pain controlled.  Will be challenging to discharge him to a safe environment given his mental health issues and lack of family support (although he does have a mother and a brother that live locally). Code Status:     Code Status Orders  Start     Ordered   04/01/15 1930  Full code   Continuous     04/01/15 1929      IV Access:    Peripheral IV   Procedures and diagnostic studies:   Ct  Chest Wo Contrast  04/04/2015   CLINICAL DATA:  History of metastatic thyroid cancer  EXAM: CT CHEST WITHOUT CONTRAST  TECHNIQUE: Multidetector CT imaging of the chest was performed following the standard protocol without IV contrast.  COMPARISON:  12/24/2014  FINDINGS: The lungs are well aerated bilaterally. The moderate right-sided pleural effusion is again identified stable from the previous exam. No significant left pleural effusion is seen. There are multiple too-numerous-to-count pulmonary mass is identified bilaterally. Largest of these on the left measures approximately 3.4 cm and is stable in size from the prior exam. Conglomeration of nodules is noted centrally. No significant hilar or mediastinal adenopathy is noted.  The bony structures show multiple areas of metastatic disease. An expansile lytic lesion is noted within the scapula on the right laterally just below the glenoid and extending into the inferior aspect of the glenoid. Additionally an expansile lesion is noted within the right anterior chest wall. It measures approximately 5.8 cm in greatest dimension and is slightly enlarged from the prior exam. Multiple spinal lesions are seen. The areas of worst lytic destruction are noted in the left vertebral body at T10 as well as the left half of the vertebral body at T12 and within the pedicle and lamina and extending laterally from the L1. The changes at L1 show encroachment and destruction of the posterior wall of the spinal canal although no definitive soft tissue component is seen within the canal. The lack of IV contrast somewhat limits evaluation. The remainder the upper abdomen is within normal limits with the exception of metastatic disease of the left adrenal gland. The adrenal nodule measures 2.7 cm increased from 1.9 cm on the prior exam.  IMPRESSION: Changes consistent with the given clinical history of metastatic thyroid cancer to the lungs and bones as well as the left adrenal gland.  There is been some slight increase in the degree of bony abnormalities. The lung abnormalities are roughly stable from the prior exam.  Stable right-sided effusion.   Electronically Signed   By: Inez Catalina M.D.   On: 04/04/2015 14:16   Mr Frank Ward OE Contrast  04/13/2015   CLINICAL DATA:  Diplopia. Metastatic thyroid cancer. Osseous metastases.  EXAM: MRI HEAD WITHOUT AND WITH CONTRAST  TECHNIQUE: Multiplanar, multiecho pulse sequences of the brain and surrounding structures were obtained without and with intravenous contrast.  CONTRAST:  59mL MULTIHANCE GADOBENATE DIMEGLUMINE 529 MG/ML IV SOLN  COMPARISON:  MRI brain without and with contrast 05/09/2014.  FINDINGS: The a heterogeneous enhancing lesion centered in the fourth ventricle demonstrates marked interval enlargement acute and chronic blood products are evident within this lesion. There is significant increase in surrounding vasogenic edema. T2 changes extending to the cerebellar peduncle is bilaterally as well as the inferior colliculus on the right.  Although the lesion has enlarged and demonstrates intrinsic blood products, there is no obstructing hydrocephalus. Mild ventricular enlargement is stable, compatible with the degree of atrophy.  A 7 mm enhancing lesion along the right choroid plexus any punctate lesion within the right central sulcus are stable.  A new enhancing lesion within the anterior left parietal lobe measures 1.4 x 1.9 x 1.6 cm. There is significant surrounding edema and mass effect with partial effacement of the  left lateral ventricle. Midline shift measures 5 mm at the foramen of Monro.  No other enhancing parenchymal lesions are evident. Pathologic fracture of the C3 vertebral body is again noted with posterior extension of tumor and bone resulting and significant central canal stenosis at the C3 level. Chronic collapse of the C4 vertebral body is noted as well.  No acute infarct is present. Flow is present in the major  intracranial arteries. Chronic opacification of the right frontal sinus is noted. A polyp or mucous retention cyst is again noted within the left maxillary sinus. The remaining paranasal sinuses are clear. Increased fluid is present in the left mastoid air cells. No obstructing nasopharyngeal lesion is present.  IMPRESSION: 1. Increased size of heterogeneous enhancing mass lesion within the fourth ventricle, now measuring 3.0 x 2.2 x 2.2 cm. The lesion demonstrates acute and chronic intrinsic blood products. 2. New enhancing metastatic lesion in the anterior left parietal lobe with significant surrounding vasogenic edema and mass effect. Midline shift is 5 mm. Intrinsic blood products are noted. 3. Stable focal enhancing lesions along the right choroid plexus and within the right central sulcus. 4. Moderate atrophy and diffuse white matter disease is otherwise stable. 5. Chronic right frontal sinus disease. 6. Left mastoid effusion. No obstructing nasopharyngeal lesion is evident.   Electronically Signed   By: San Morelle M.D.   On: 04/13/2015 12:34   Dg Chest Port 1 View  04/01/2015   CLINICAL DATA:  Left clavicle and shoulder pain. Thyroid cancer with metastatic bone disease.  EXAM: PORTABLE CHEST - 1 VIEW  COMPARISON:  CT chest 12/24/2014.  FINDINGS: The heart size is normal. Multiple bilateral pulmonary nodules are present bilaterally compatible with known metastatic disease. There has been some progression since the prior study. No focal airspace consolidation is evident otherwise.  Rightward curvature is present in the lower thoracic spine due to collapse pathologic left-sided fracture, unchanged. An erosive lesion is again noted in the right scapula. No definite new lesions are evident.  IMPRESSION: 1. Progression of numerous pulmonary nodules compatible with known metastatic disease. 2. Rightward curvature of the thoracic spine is secondary to asymmetric left-sided pathologic fractures,  unchanged. 3. Stable erosive lesion in the right scapula.   Electronically Signed   By: San Morelle M.D.   On: 04/01/2015 16:19     Medical Consultants:    Radiation Oncology: [name], MD  Psychiatry: Ambrose Finland, MD  Oncology: Chauncey Cruel, MD  Anti-Infectives:    None.  Subjective:   Frank Ward is frustrated and sad today. He continues to have unrealistic expectations about his treatment options. Tells me he wants "surgery "to remove the tumors in his brain. Has difficulty concentrating during my interview. He continues to express paranoid ideas about how his physicians are withholding treatment from him but admits that he is "scared ".   Objective:    Filed Vitals:   04/13/15 0525 04/13/15 1320 04/13/15 2046 04/14/15 0652  BP: 104/62 106/65 111/69 104/51  Pulse: 94 89 103 64  Temp: 98 F (36.7 C) 98 F (36.7 C) 97.9 F (36.6 C) 98.5 F (36.9 C)  TempSrc: Oral Oral Oral Oral  Resp: 16 16 16 18   Height:      Weight:      SpO2: 93% 99% 98% 98%    Intake/Output Summary (Last 24 hours) at 04/14/15 0749 Last data filed at 04/14/15 0653  Gross per 24 hour  Intake    525 ml  Output  525 ml  Net      0 ml    Exam: Gen: NAD Psych: Depressed affect Cardiovascular:  RRR, No M/R/G Respiratory:  Lungs diminished Gastrointestinal:  Abdomen soft, NT/ND, + BS Extremities:  No C/E/C   Data Reviewed:    Labs: Basic Metabolic Panel:  Recent Labs Lab 04/08/15 0413  CREATININE 0.74   GFR Estimated Creatinine Clearance: 96.6 mL/min (by C-G formula based on Cr of 0.74).  CBC: No results for input(s): WBC, NEUTROABS, HGB, HCT, MCV, PLT in the last 168 hours. CBG:  Recent Labs Lab 04/08/15 0827 04/13/15 1023  GLUCAP 92 77   Microbiology No results found for this or any previous visit (from the past 240 hour(s)).   Medications:   . dexamethasone  4 mg Intravenous 4 times per day  . feeding supplement (ENSURE  ENLIVE)  237 mL Oral TID BM  . morphine  60 mg Oral Q12H  . polyethylene glycol  17 g Oral BID  . QUEtiapine  100 mg Oral Daily  . senna-docusate  1 tablet Oral BID   Continuous Infusions:   Time spent: 25 minutes.   LOS: 13 days   [facility] Pager [phone]. If unable to reach me by pager, please call my cell phone at [phone].  *Please refer to [email or web address], password [email or web address] to get updated schedule on who will round on this patient, as hospitalists switch teams weekly. If 7PM-7AM, please contact night-coverage at [email or web address], password [email or web address] for any overnight needs.  04/14/2015, 7:49 AM

## 2015-04-14 NOTE — Progress Notes (Signed)
PT Cancellation Note  Patient Details Name: Frank Ward MRN: [id number] DOB: September 04, 1970   Cancelled Treatment:     pt declined despite several attempts to assist him to the bathroom, assist him just to the recliner or even assist him to EOB to use urinal.  With each attempt pt demon  increased agitation/frustration with difficulty finding his words. Pt wanted the nurse for his pain meds, "Im suppose to get it every 2 hours".  I looked in EPIC and Dilaudid was given an hour ago.  Reassured him and handed him his urinal on request.  Will continue to attempt to see per MD order however pt has not been cooperative.    Nathanial Rancher 04/14/2015, 11:19 AM

## 2015-04-14 NOTE — Telephone Encounter (Signed)
George Ina, RN on [place] to see if [place] would like to come for [employer] today.  Yareth said he did not want to have CT Simulation today.  Notified Aaron Edelman in [place] SIM.

## 2015-04-14 NOTE — Progress Notes (Signed)
PT recommended Supervision/assistance-24 hour. This CM and CSW met with pt at bedside to discuss disposition planning. Pt appeared frustrated/agitated at times during conversation and had to be redirected several times. Pt did express that he wanted to go to [place] apartment [place] but could not come up with a support person that could be with him 24 hrs a day. Pt states that his mother and brother could "help if needed" Pt did agree to be faxed out to SNF's to have options at time of discharge. CM will continue to follow. Marney Doctor RN,BSN,NCM [phone]

## 2015-04-14 NOTE — Clinical Social Work Note (Signed)
Clinical Social Work Assessment  Patient Details  Name: Frank Ward MRN: [id number] Date of Birth: [birth year]  Date of referral:  04/14/15               Reason for consult:  Discharge Planning                Permission sought to share information with:    Permission granted to share information::  No  Name::        Agency::     Relationship::     Contact Information:     Housing/Transportation Living arrangements for the past 2 months:  Apartment Source of Information:  Patient Patient Interpreter Needed:  None Criminal Activity/Legal Involvement Pertinent to Current Situation/Hospitalization:  No - Comment as needed Significant Relationships:  Siblings, Parents (pt states mother and brother assist as needed) Lives with:  Self Do you feel safe going back to the place where you live?  Yes (pt discussed wanting to return to his apartment, but open to exploring rehab options) Need for family participation in patient care:  No (Coment)  Care giving concerns:  Pt admitted from home alone. Concern about pt ability to care for himself at home.    Social Worker assessment / plan:  CSW received referral for New SNF.   CSW and RNCM with pt at bedside. PT recommended Supervision/assistance-24 hour. Pt reported that he was in pain. Pt appeared frustrated/agitated at times during assessment and had to be redirected several times. CSW and RNCM inquired about pt plans upon discharge. Pt expressed that he wanted to go to his apartment upon discharge but could not identify a support person that could be with him 24 hrs a day. Pt discussed that his mother and brother could "help if needed" . Pt discussed that he was hopeful that he was hopeful that he would get an aide in the home and stated that Abby Potash, [facility] CSW had applied for. CSW discussed that although that was applied for that it could take weeks for that to be approved and put into place. CSW discussed SNF stay and clarified pt questions  surrounding SNF. Pt did agree to be faxed out to SNF's to have options at time of discharge.   CSW completed FL2 and initiated SNF search to [facility]. CSW submitted pasarr, but pasarr sent for manual review. CSW to submit additional information to  Must for pasarr.   CSW to follow up with pt regarding SNF bed offers and decision for disposition.  CSW to continue to follow to provide support and assist with pt discharge planning needs.      Employment status:  Disabled (Comment on whether or not currently receiving Disability) Insurance information:  Medicare, Medicaid In [place] PT Recommendations:  24 Hour Supervision Information / Referral to community resources:  [place]  Patient/Family's Response to care:  Pt alert and oriented x 4 however pt had periods where he needed to be redirected as he began discussing subject matters not related to current discussion.   Patient/Family's Understanding of and Emotional Response to Diagnosis, Current Treatment, and Prognosis:  Pt has been deemed to have capacity by psychiatrist. Pt displays poor insight into his current diagnosis and prognosis. Pt did agree for CSW to explore SNF rehab options.   Emotional Assessment Appearance:  Appears stated age Attitude/Demeanor/Rapport:  Guarded, Angry Affect (typically observed):  Irritable Orientation:  Oriented to Self, Oriented to Place, Oriented to  Time, Oriented to Situation Alcohol / Substance use:  Not Applicable Psych involvement (Current and /or in the community):  Yes (Comment) (psych assessment for capacity)  Discharge Needs  Concerns to be addressed:  Discharge Planning Concerns Readmission within the last 30 days:    Current discharge risk:  Physical Impairment, Lack of support system, Terminally ill Barriers to Discharge:  [place] (Pasarr), Continued Medical Work up   Ladell Pier, LCSW 04/14/2015, 3:21 PM  [phone]

## 2015-04-14 NOTE — Clinical Social Work Placement (Signed)
   CLINICAL SOCIAL WORK PLACEMENT  NOTE  Date:  04/14/2015  Patient Details  Name: Frank Ward MRN: [id number] Date of Birth: [birth year]  Clinical Social Work is seeking post-discharge placement for this patient at the [place] level of care (*CSW will initial, date and re-position this form in  chart as items are completed):  Yes   Patient/family provided with [facility] Work Department's list of facilities offering this level of care within the geographic area requested by the patient (or if unable, by the patient's family).  Yes   Patient/family informed of their freedom to choose among providers that offer the needed level of care, that participate in Medicare, Medicaid or managed care program needed by the patient, have an available bed and are willing to accept the patient.  Yes   Patient/family informed of [facility]'s ownership interest in [facility] and [facility], as well as of the fact that they are under no obligation to receive care at these facilities.  PASRR submitted to EDS on 04/14/15     PASRR number received on       Existing PASRR number confirmed on       FL2 transmitted to all facilities in geographic area requested by pt/family on 04/14/15     FL2 transmitted to all facilities within larger geographic area on       Patient informed that his/her managed care company has contracts with or will negotiate with certain facilities, including the following:            Patient/family informed of bed offers received.  Patient chooses bed at       Physician recommends and patient chooses bed at      Patient to be transferred to   on  .  Patient to be transferred to facility by       Patient family notified on   of transfer.  Name of family member notified:        PHYSICIAN Please sign FL2     Additional Comment:    _______________________________________________ Ladell Pier, LCSW 04/14/2015, 3:43  PM

## 2015-04-15 ENCOUNTER — Encounter: Payer: Self-pay | Admitting: *Deleted

## 2015-04-15 ENCOUNTER — Inpatient Hospital Stay (HOSPITAL_COMMUNITY): Payer: Medicare Other

## 2015-04-15 ENCOUNTER — Encounter (HOSPITAL_COMMUNITY): Payer: Self-pay | Admitting: Radiology

## 2015-04-15 LAB — COMPREHENSIVE METABOLIC PANEL
ALBUMIN: 3.3 g/dL — AB (ref 3.5–5.0)
ALT: 15 U/L — ABNORMAL LOW (ref 17–63)
ANION GAP: 8 (ref 5–15)
AST: 17 U/L (ref 15–41)
Alkaline Phosphatase: 97 U/L (ref 38–126)
BUN: 14 mg/dL (ref 6–20)
CALCIUM: 9.1 mg/dL (ref 8.9–10.3)
CO2: 27 mmol/L (ref 22–32)
Chloride: 105 mmol/L (ref 101–111)
Creatinine, Ser: 0.73 mg/dL (ref 0.61–1.24)
GFR calc non Af Amer: >60 mL/min (ref >60)
GLUCOSE: 141 mg/dL — AB (ref 65–99)
POTASSIUM: 4.2 mmol/L (ref 3.5–5.1)
SODIUM: 140 mmol/L (ref 135–145)
Total Bilirubin: 0.5 mg/dL (ref 0.3–1.2)
Total Protein: 6 g/dL — ABNORMAL LOW (ref 6.5–8.1)

## 2015-04-15 LAB — MAGNESIUM: Magnesium: 1.9 mg/dL (ref 1.7–2.4)

## 2015-04-15 LAB — TSH: TSH: 9.865 u[IU]/mL — AB (ref 0.350–4.500)

## 2015-04-15 MED ORDER — LORAZEPAM 2 MG/ML IJ SOLN
INTRAMUSCULAR | Status: AC
  Filled 2015-04-15: qty 1

## 2015-04-15 MED ORDER — HALOPERIDOL LACTATE 5 MG/ML IJ SOLN
2.5000 mg | Freq: Four times a day (QID) | INTRAMUSCULAR | Status: DC | PRN
  Administered 2015-04-15 – 2015-04-18 (×3): 2.5 mg via INTRAVENOUS
  Filled 2015-04-15 (×3): qty 1

## 2015-04-15 MED ORDER — LORAZEPAM 2 MG/ML IJ SOLN
1.0000 mg | Freq: Once | INTRAMUSCULAR | Status: AC
  Administered 2015-04-15: 1 mg via INTRAVENOUS

## 2015-04-15 NOTE — Progress Notes (Signed)
   04/15/15 0044  What Happened  Was fall witnessed? No  Was patient injured? Yes  Patient found on floor  Found by Staff-comment (RN's Melvina M. and Lajuana Carry)  Stated prior activity other (comment) (will not explain.)  Follow Up  MD notified Chaney Malling)  Time MD notified (around 1215)  Family notified Yes-comment (Brother Raymond notified at 6am)  Time family notified 0050  Additional tests Yes-comment (MD ordering CT scan)  Simple treatment Other (comment) (cleaned wound to head, dressing applied)  Progress note created (see row info) Yes  Adult Fall Risk Assessment  Risk Factor Category (scoring not indicated) Fall has occurred during this admission (document High fall risk);High fall risk per protocol (document High fall risk)  Patient's Fall Risk High Fall Risk (>13 points)  Adult Fall Risk Interventions  Required Bundle Interventions *See Row Information* High fall risk - low, moderate, and high requirements implemented  Additional Interventions Fall risk signage;Individualized elimination schedule;Reorient/diversional activities with confused patients;Secure all tubes/drains;24 hour supervision/sitter  Fall with Injury Screening  Risk For Fall Injury- See Row Information  D;Nurse judgement  Intervention(s) for 2 or more risk criteria identified Safety Sitter  Pain Assessment  Pain Score 6  PCA/Epidural/Spinal Assessment  Respiratory Pattern Regular  Neurological  Neuro (WDL) X  Level of Consciousness Alert  Orientation Level Oriented to person  Cognition Impulsive;Poor attention/concentration;Poor judgement;Poor safety awareness;Unable to follow commands;Memory impairment  Speech Clear  Pupil Assessment  No  Motor Function/Sensation Assessment (generalized weakness)  Neuro Symptoms Anxiety;Agitation  Neuro Additional Assessments No  Neuro Additional Assessments No  Musculoskeletal  Musculoskeletal (WDL) X  Assistive Device None  Generalized  Weakness Yes  Weight Bearing Restrictions No  Integumentary  Integumentary (WDL) X (Small hematoma/cut to left forehead.Area cleaned&foam appld)  Skin Color Appropriate for ethnicity  Skin Condition Dry  Skin Integrity Intact  Skin Turgor Non-tenting

## 2015-04-15 NOTE — Progress Notes (Signed)
It was reported to me at 2355pm that patient had fallen in room 1338, while helping settle an admission in another room.  Another RN reported that she found patient on his left knee by the desk area in the room, with his right leg out. He had his hands on the desk, pulling himself up.  Patient had hit his head on the desk in the room.  He has a small hematoma to his left side that is bleeding a small amount. Area cleaned and dressing applied.  Patient slightly confused at this time (a little more so than earlier on in the shift)  He is irritable and only will answer questions at will.  Will not explain what he was doing when he fell.  MD came to assess patient.  She will order CT scan.  Vital signs stable. MD will also order sitter for patient.  Will continue to monitor.Azzie Glatter [place]

## 2015-04-15 NOTE — Progress Notes (Signed)
[facility] Work  Clinical Social Work was referred by [employer] CSW due to upcoming meeting tomorrow at [place] with team.  Clinical Social Worker met with patient in the hospital to check in and see if pt was aware of meeting. Pt not aware of meeting and this CSW informed him of stated meeting and inquired if he had family or friends that could attend. Pt upset as he stated he was phoning his mother, Frank Ward, "all day", but she was not answering the phone. CSW assisted pt in leaving a message for his mother and also mentioned the morning meet.   He was very upset and fixated on getting his furniture out of storage to get his apartment ready to come home to. CSW attempted to redirect pt several times, but he got upset and informed this CSW that he "was going to get real upset, as I have to fix this right now". CSW agreed to give him his space and attempted to remind him of meeting. Pt appreciated visit and apologized, but "had to get this done".  As CSW was leaving, pt's brother, Frank Ward and mother, Frank Ward arrived. CSW informed them of 10am meeting and they plan to attend. They shared that they had attempted to help pt get furniture out of storage, but pt can't tell them where said furniture is stored. They stated pt needs a hospital bed at home at his apartment and that it is a very nice place. They felt with some assistance they could possibly rotate shifts at said apartment as it is a two bedroom. They had questions about possible dispo options and assistance at home. CSW briefly reviewed these and payor source for each. They stated they did not think pt would last at a SNF. They shared they have tried to help pt many times, but it has been very difficult. They appear to act in best interest for pt. Pt lit up when he saw his brother and mother. CSW reminded all of meeting in the morning.  This CSW will attend 10am meeting as well.   Clinical Social Work interventions: Pt advocacy  Education  Frank Ward, [place] Worker [place]  [place] Phone: [phone] Fax: [phone]

## 2015-04-15 NOTE — Progress Notes (Addendum)
Progress Note   Frank Ward TIW:[id number] DOB: 1971/04/12 DOA: 04/01/2015 PCP: No PCP Per Patient   Brief Narrative:   Frank Ward is an 44 y.o. 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 Patient underwent psychiatric evaluation 04/07/15 secondary to refusal of radiation treatments and was diagnosed with adjustment disorder with mixed anxiety and depressed mood. He was felt to have capacity to make his own medical decisions. He is currently undergoing ongoing radiation treatments and the radiation oncologist is considering radioactive iodine therapy however his TSH needs to be greater than 30 before this can be done. Synthroid on hold pending further treatment recommendations by radiation oncologist.  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 - Status post thyroidectomy (01/29/13) as well as ablative radioactive iodine. - Has been noncompliant with treatment planning as an outpatient with failure to follow-up on multiple occasions. - Continue Dilaudid as needed. Refuses to consider a PCA for better pain control, or take oral medications . - Pain control efforts limited by refusal of oral pain medications.  - Spoke with Dr. Sondra Come 04/09/15 regarding possibility of treatment with additional  radioactive iodine.  - TSH elevated. Needs to be greater than 30 for radioactive iodine treatment. Hold Synthroid. Repeat TSH 04/13/15 18.9. - Dr. Sondra Come continues to follow him and is considering additional radiation treatments to his left  shoulder area. Patient has been refusing care, noncompliant with medical treatment and recommendations. Palliative care has been consulted and they are following. We would need a multidisciplinary meeting with the patient, consider all treatment options and if no treatment options available or if treatment is futile, then consider hospice placement. We will try to arrange this meeting on 9/1. I will reconsult psychiatry to determine capacity.   Fall-patient very noncompliant with treatment if agreeable will check CMP CBC, continue sitter multiple findings on CT scan of the cervical spine and CT of the head but no acute trauma.    Diplopia and tinnitis - Reports a 1 week history of diplopia and a ringing in his right ear (but didn't tell anyone). - MRI brain shows enlarging metastasis with vasogenic edema. IV Decadron started 04/13/15. - Case discussed with Dr. Sondra Come who will review the images to see if these areas are amenable to any further treatment.    Hypothyroidism - Holding Synthroid as noted above.    Opiod-induced constipation - Continue Senokot, Colace and MiraLAX.    Severe protein calorie malnutrition with cachexia - Continue Ensure supplements. Dietitian following. - Current weight 130 pounds (59.24 kg), an 8 pound weight loss from 03/03/15.    Noncompliance - Related to underlying depression/anxiety and loss of control in this young man facing a terminal diagnosis. - Status post psychiatry evaluation where he was found to have capacity. - Mood remains somewhat labile.    Adjustment disorder with mixed anxiety and depressed mood - Refuses to take Seroquel, continue Ativan and trazodone as needed. - Found to have capacity per psychiatrist.  - With lack of family support, and clearly lacking the ability to care for himself, his discharge will be problematic.     Anemia  in neoplastic disease - No current indication for transfusion.    DVT Prophylaxis - Lovenox ordered but has  been refusing to take it.  Family Communication: No family present at the bedside. Declines my offer to call. Disposition Plan: Home versus SNF or residential hospice when stable and pain controlled.  Will be challenging to discharge him to a safe environment given his mental health issues and lack of family support (although he does have a mother and a brother that live locally).    Code Status:     Code Status Orders        Start     Ordered   04/01/15 1930  Full code   Continuous     04/01/15 1929      IV Access:    Peripheral IV   Procedures and diagnostic studies:   Ct Head Wo Contrast  04/15/2015   CLINICAL DATA:  Status post fall out of bed. Hit left temporal region, with laceration. Combative. Metastatic thyroid cancer. Initial encounter.  EXAM: CT HEAD WITHOUT CONTRAST  CT CERVICAL SPINE WITHOUT CONTRAST  TECHNIQUE: Multidetector CT imaging of the head and cervical spine was performed following the standard protocol without intravenous contrast. Multiplanar CT image reconstructions of the cervical spine were also generated.  COMPARISON:  MRI of the brain performed 04/13/2015, and CT of the head performed 02/06/2014. CT of the cervical spine performed 07/26/2013  FINDINGS: CT HEAD FINDINGS  A 1.6 x 1.4 cm high attenuation mass is again noted at the high left parietal lobe, with diffusely decreased white matter attenuation involving much of the left parietal and frontal lobes. A complex mass is again noted at the fourth ventricle, measuring 3.0 x 1.7 cm. There is minimal rightward midline shift, measuring 3-4 mm.  Prominence of the ventricles and sulci reflects mild cortical volume loss. Periventricular white matter change likely reflects small vessel ischemic microangiopathy.  There is no evidence of fracture; visualized osseous structures are unremarkable in appearance. The orbits are within normal limits. There is opacification of the right frontal sinus. Mild mucosal thickening  is noted at the left maxillary sinus. The remaining paranasal sinuses and mastoid air cells are well-aerated. No significant soft tissue abnormalities are seen.  CT CERVICAL SPINE FINDINGS  Since the prior study in 2014, there has been further collapse of the pathologic fractures of C3 and C4, with approximately 4 mm of retropulsion noted at C3, and narrowing of the spinal canal to 6-7 mm in AP dimension on sagittal images. There is corresponding mild grade 1 retrolisthesis of C2 on C3. An apparent chronic lesion is noted at the inferior endplate of T1, grossly unchanged in appearance. Prevertebral soft tissues are within normal limits.  The thyroid gland is not well seen. The visualized lung apices are clear. No significant soft tissue abnormalities are seen.  IMPRESSION: 1. No evidence of traumatic intracranial injury or fracture. 2. Further collapse of the pathologic fractures of C3 and C4 since 2014, with approximately 4 mm of the retropulsion noted at C3, and narrowing of the spinal canal to 6-7 mm in AP dimension on sagittal images. Corresponding mild grade 1 retrolisthesis of C2 on C3. 3. Complex mass again noted at the fourth ventricle, measuring 3.0 x 1.7 cm. 4. 1.6 x 1.4 cm high attenuation mass again noted at the high left parietal lobe, with diffusely decreased white matter attenuation involving much of the left parietal and frontal lobes. Minimal associated rightward midline shift, measuring 3-4 mm. 5. Apparent chronic lesion  again noted at the inferior endplate of C1, stable from 2014. 6. Mild cortical volume loss and scattered small vessel ischemic microangiopathy. 7. Opacification of the right frontal sinus. Mild mucosal thickening at the left maxillary sinus. These results were called by telephone at the time of interpretation on 04/15/2015 at 4:42 am to [facility] on [place], who verbally acknowledged these results.   Electronically Signed   By: Garald Balding M.D.   On: 04/15/2015 04:42   Ct  Chest Wo Contrast  04/04/2015   CLINICAL DATA:  History of metastatic thyroid cancer  EXAM: CT CHEST WITHOUT CONTRAST  TECHNIQUE: Multidetector CT imaging of the chest was performed following the standard protocol without IV contrast.  COMPARISON:  12/24/2014  FINDINGS: The lungs are well aerated bilaterally. The moderate right-sided pleural effusion is again identified stable from the previous exam. No significant left pleural effusion is seen. There are multiple too-numerous-to-count pulmonary mass is identified bilaterally. Largest of these on the left measures approximately 3.4 cm and is stable in size from the prior exam. Conglomeration of nodules is noted centrally. No significant hilar or mediastinal adenopathy is noted.  The bony structures show multiple areas of metastatic disease. An expansile lytic lesion is noted within the scapula on the right laterally just below the glenoid and extending into the inferior aspect of the glenoid. Additionally an expansile lesion is noted within the right anterior chest wall. It measures approximately 5.8 cm in greatest dimension and is slightly enlarged from the prior exam. Multiple spinal lesions are seen. The areas of worst lytic destruction are noted in the left vertebral body at T10 as well as the left half of the vertebral body at T12 and within the pedicle and lamina and extending laterally from the L1. The changes at L1 show encroachment and destruction of the posterior wall of the spinal canal although no definitive soft tissue component is seen within the canal. The lack of IV contrast somewhat limits evaluation. The remainder the upper abdomen is within normal limits with the exception of metastatic disease of the left adrenal gland. The adrenal nodule measures 2.7 cm increased from 1.9 cm on the prior exam.  IMPRESSION: Changes consistent with the given clinical history of metastatic thyroid cancer to the lungs and bones as well as the left adrenal gland.  There is been some slight increase in the degree of bony abnormalities. The lung abnormalities are roughly stable from the prior exam.  Stable right-sided effusion.   Electronically Signed   By: Inez Catalina M.D.   On: 04/04/2015 14:16   Ct Cervical Spine Wo Contrast  04/15/2015   CLINICAL DATA:  Status post fall out of bed. Hit left temporal region, with laceration. Combative. Metastatic thyroid cancer. Initial encounter.  EXAM: CT HEAD WITHOUT CONTRAST  CT CERVICAL SPINE WITHOUT CONTRAST  TECHNIQUE: Multidetector CT imaging of the head and cervical spine was performed following the standard protocol without intravenous contrast. Multiplanar CT image reconstructions of the cervical spine were also generated.  COMPARISON:  MRI of the brain performed 04/13/2015, and CT of the head performed 02/06/2014. CT of the cervical spine performed 07/26/2013  FINDINGS: CT HEAD FINDINGS  A 1.6 x 1.4 cm high attenuation mass is again noted at the high left parietal lobe, with diffusely decreased white matter attenuation involving much of the left parietal and frontal lobes. A complex mass is again noted at the fourth ventricle, measuring 3.0 x 1.7 cm. There is minimal rightward midline shift, measuring 3-4 mm.  Prominence of the ventricles and sulci reflects mild cortical volume loss. Periventricular white matter change likely reflects small vessel ischemic microangiopathy.  There is no evidence of fracture; visualized osseous structures are unremarkable in appearance. The orbits are within normal limits. There is opacification of the right frontal sinus. Mild mucosal thickening is noted at the left maxillary sinus. The remaining paranasal sinuses and mastoid air cells are well-aerated. No significant soft tissue abnormalities are seen.  CT CERVICAL SPINE FINDINGS  Since the prior study in 2014, there has been further collapse of the pathologic fractures of C3 and C4, with approximately 4 mm of retropulsion noted at C3, and  narrowing of the spinal canal to 6-7 mm in AP dimension on sagittal images. There is corresponding mild grade 1 retrolisthesis of C2 on C3. An apparent chronic lesion is noted at the inferior endplate of T1, grossly unchanged in appearance. Prevertebral soft tissues are within normal limits.  The thyroid gland is not well seen. The visualized lung apices are clear. No significant soft tissue abnormalities are seen.  IMPRESSION: 1. No evidence of traumatic intracranial injury or fracture. 2. Further collapse of the pathologic fractures of C3 and C4 since 2014, with approximately 4 mm of the retropulsion noted at C3, and narrowing of the spinal canal to 6-7 mm in AP dimension on sagittal images. Corresponding mild grade 1 retrolisthesis of C2 on C3. 3. Complex mass again noted at the fourth ventricle, measuring 3.0 x 1.7 cm. 4. 1.6 x 1.4 cm high attenuation mass again noted at the high left parietal lobe, with diffusely decreased white matter attenuation involving much of the left parietal and frontal lobes. Minimal associated rightward midline shift, measuring 3-4 mm. 5. Apparent chronic lesion again noted at the inferior endplate of C1, stable from 2014. 6. Mild cortical volume loss and scattered small vessel ischemic microangiopathy. 7. Opacification of the right frontal sinus. Mild mucosal thickening at the left maxillary sinus. These results were called by telephone at the time of interpretation on 04/15/2015 at 4:42 am to [facility] on [place], who verbally acknowledged these results.   Electronically Signed   By: Garald Balding M.D.   On: 04/15/2015 04:42   Mr Jeri Cos OE Contrast  04/13/2015   CLINICAL DATA:  Diplopia. Metastatic thyroid cancer. Osseous metastases.  EXAM: MRI HEAD WITHOUT AND WITH CONTRAST  TECHNIQUE: Multiplanar, multiecho pulse sequences of the brain and surrounding structures were obtained without and with intravenous contrast.  CONTRAST:  29mL MULTIHANCE GADOBENATE DIMEGLUMINE 529 MG/ML  IV SOLN  COMPARISON:  MRI brain without and with contrast 05/09/2014.  FINDINGS: The a heterogeneous enhancing lesion centered in the fourth ventricle demonstrates marked interval enlargement acute and chronic blood products are evident within this lesion. There is significant increase in surrounding vasogenic edema. T2 changes extending to the cerebellar peduncle is bilaterally as well as the inferior colliculus on the right.  Although the lesion has enlarged and demonstrates intrinsic blood products, there is no obstructing hydrocephalus. Mild ventricular enlargement is stable, compatible with the degree of atrophy.  A 7 mm enhancing lesion along the right choroid plexus any punctate lesion within the right central sulcus are stable.  A new enhancing lesion within the anterior left parietal lobe measures 1.4 x 1.9 x 1.6 cm. There is significant surrounding edema and mass effect with partial effacement of the left lateral ventricle. Midline shift measures 5 mm at the foramen of [place].  No other enhancing parenchymal lesions are evident. Pathologic fracture of the  C3 vertebral body is again noted with posterior extension of tumor and bone resulting and significant central canal stenosis at the C3 level. Chronic collapse of the C4 vertebral body is noted as well.  No acute infarct is present. Flow is present in the major intracranial arteries. Chronic opacification of the right frontal sinus is noted. A polyp or mucous retention cyst is again noted within the left maxillary sinus. The remaining paranasal sinuses are clear. Increased fluid is present in the left mastoid air cells. No obstructing nasopharyngeal lesion is present.  IMPRESSION: 1. Increased size of heterogeneous enhancing mass lesion within the fourth ventricle, now measuring 3.0 x 2.2 x 2.2 cm. The lesion demonstrates acute and chronic intrinsic blood products. 2. New enhancing metastatic lesion in the anterior left parietal lobe with significant  surrounding vasogenic edema and mass effect. Midline shift is 5 mm. Intrinsic blood products are noted. 3. Stable focal enhancing lesions along the right choroid plexus and within the right central sulcus. 4. Moderate atrophy and diffuse white matter disease is otherwise stable. 5. Chronic right frontal sinus disease. 6. Left mastoid effusion. No obstructing nasopharyngeal lesion is evident.   Electronically Signed   By: San Morelle M.D.   On: 04/13/2015 12:34   Dg Chest Port 1 View  04/01/2015   CLINICAL DATA:  Left clavicle and shoulder pain. Thyroid cancer with metastatic bone disease.  EXAM: PORTABLE CHEST - 1 VIEW  COMPARISON:  CT chest 12/24/2014.  FINDINGS: The heart size is normal. Multiple bilateral pulmonary nodules are present bilaterally compatible with known metastatic disease. There has been some progression since the prior study. No focal airspace consolidation is evident otherwise.  Rightward curvature is present in the lower thoracic spine due to collapse pathologic left-sided fracture, unchanged. An erosive lesion is again noted in the right scapula. No definite new lesions are evident.  IMPRESSION: 1. Progression of numerous pulmonary nodules compatible with known metastatic disease. 2. Rightward curvature of the thoracic spine is secondary to asymmetric left-sided pathologic fractures, unchanged. 3. Stable erosive lesion in the right scapula.   Electronically Signed   By: San Morelle M.D.   On: 04/01/2015 16:19     Medical Consultants:    Radiation Oncology: [name], MD  Psychiatry: Ambrose Finland, MD  Oncology: Chauncey Cruel, MD  Anti-Infectives:    None.  Subjective:   Sigmond Danelle Earthly patient had a fall last night,  Another RN reported that she found patient on his left knee by the desk area in the room, with his right leg out. He had his hands on the desk, pulling himself up. Patient had hit his head on the desk in the room.  He has a small hematoma to his left side that is bleeding a small amount. Area cleaned and dressing applied. Patient slightly confused at this time (a little more so than earlier on in the shift) He is irritable and only will answer questions at will  Objective:    Filed Vitals:   04/15/15 0122 04/15/15 0224 04/15/15 0340 04/15/15 0438  BP: 115/63 107/69 120/58 113/71  Pulse: 86 88 90 81  Temp: 98.2 F (36.8 C) 98.4 F (36.9 C) 98.7 F (37.1 C) 98.1 F (36.7 C)  TempSrc: Axillary Axillary Axillary Axillary  Resp: 16 20 18 20   Height:      Weight:      SpO2: 96% 98% 97% 98%    Intake/Output Summary (Last 24 hours) at 04/15/15 0945 Last data filed at 04/14/15  2058  Gross per 24 hour  Intake   1144 ml  Output    487 ml  Net    657 ml    Exam: Gen: NAD Psych: Depressed affect Cardiovascular:  RRR, No M/R/G Respiratory:  Lungs diminished Gastrointestinal:  Abdomen soft, NT/ND, + BS Extremities:  No C/E/C   Data Reviewed:    Labs: Basic Metabolic Panel: No results for input(s): NA, K, CL, CO2, GLUCOSE, BUN, CREATININE, CALCIUM, MG, PHOS in the last 168 hours. GFR Estimated Creatinine Clearance: 96.6 mL/min (by C-G formula based on Cr of 0.74).  CBC: No results for input(s): WBC, NEUTROABS, HGB, HCT, MCV, PLT in the last 168 hours. CBG:  Recent Labs Lab 04/13/15 1023  GLUCAP 77   Microbiology No results found for this or any previous visit (from the past 240 hour(s)).   Medications:   . dexamethasone  4 mg Intravenous 4 times per day  . feeding supplement (ENSURE ENLIVE)  237 mL Oral TID BM  . fentaNYL  25 mcg Transdermal Q72H  . morphine  60 mg Oral Q12H  . polyethylene glycol  17 g Oral BID  . QUEtiapine  100 mg Oral Daily  . senna-docusate  1 tablet Oral BID   Continuous Infusions:   Time spent: 25 minutes.   LOS: 14 days   [facility]  [facility] Pager [phone]  *Please refer to [email or web address], password [email or web address] to get updated schedule on  who will round on this patient, as hospitalists switch teams weekly. If 7PM-7AM, please contact night-coverage at [email or web address], password [email or web address] for any overnight needs.  04/15/2015, 9:45 AM

## 2015-04-15 NOTE — Progress Notes (Signed)
CSW continuing to follow.   CSW received notification that pt had fall yesterday evening and now has sitter present at bedside.   CSW received notification that multidisciplinary team meeting with pt is scheduled tomorrow at 10 AM.   CSW to attend meeting and assist as needed.   CSW to continue to follow to provide support and assist with pt disposition planning.  Alison Murray, MSW, [place] Work [phone]

## 2015-04-15 NOTE — Clinical Social Work Placement (Signed)
   CLINICAL SOCIAL WORK PLACEMENT  NOTE  Date:  04/15/2015  Patient Details  Name: Frank Ward MRN: [id number] Date of Birth: 06-14-1971  Clinical Social Work is seeking post-discharge placement for this patient at the [place] level of care (*CSW will initial, date and re-position this form in  chart as items are completed):  Yes   Patient/family provided with [place] Work Department's list of facilities offering this level of care within the geographic area requested by the patient (or if unable, by the patient's family).  Yes   Patient/family informed of their freedom to choose among providers that offer the needed level of care, that participate in Medicare, Medicaid or managed care program needed by the patient, have an available bed and are willing to accept the patient.  Yes   Patient/family informed of [place]'s ownership interest in [facility] and [facility], as well as of the fact that they are under no obligation to receive care at these facilities.  PASRR submitted to EDS on 04/14/15     PASRR number received on 04/15/15     Existing PASRR number confirmed on       FL2 transmitted to all facilities in geographic area requested by pt/family on 04/14/15     FL2 transmitted to all facilities within larger geographic area on       Patient informed that his/her managed care company has contracts with or will negotiate with certain facilities, including the following:            Patient/family informed of bed offers received.  Patient chooses bed at       Physician recommends and patient chooses bed at      Patient to be transferred to   on  .  Patient to be transferred to facility by       Patient family notified on   of transfer.  Name of family member notified:        PHYSICIAN Please sign FL2     Additional Comment:    _______________________________________________ Ladell Pier,  LCSW 04/15/2015, 2:41 PM
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
## 2015-04-16 ENCOUNTER — Encounter: Payer: Self-pay | Admitting: Radiation Oncology

## 2015-04-16 ENCOUNTER — Encounter: Payer: Self-pay | Admitting: *Deleted

## 2015-04-16 LAB — CBC
HEMATOCRIT: 26.6 % — AB (ref 39.0–52.0)
Hemoglobin: 8.6 g/dL — ABNORMAL LOW (ref 13.0–17.0)
MCH: 28.8 pg (ref 26.0–34.0)
MCHC: 32.3 g/dL (ref 30.0–36.0)
MCV: 89 fL (ref 78.0–100.0)
Platelets: 512 10*3/uL — ABNORMAL HIGH (ref 150–400)
RBC: 2.99 MIL/uL — ABNORMAL LOW (ref 4.22–5.81)
RDW: 15.2 % (ref 11.5–15.5)
WBC: 9.4 10*3/uL (ref 4.0–10.5)

## 2015-04-16 MED ORDER — FENTANYL 75 MCG/HR TD PT72
75.0000 ug | MEDICATED_PATCH | TRANSDERMAL | Status: DC
  Administered 2015-04-16: 75 ug via TRANSDERMAL
  Filled 2015-04-16 (×3): qty 1

## 2015-04-16 MED ORDER — HYDROMORPHONE HCL 2 MG/ML IJ SOLN: 2.0000 mg | INTRAMUSCULAR | Status: DC | PRN

## 2015-04-16 MED ORDER — MORPHINE SULFATE 15 MG PO TABS
15.0000 mg | ORAL_TABLET | ORAL | Status: DC | PRN
  Administered 2015-04-16 – 2015-04-23 (×12): 15 mg via ORAL
  Filled 2015-04-16 (×17): qty 1

## 2015-04-16 NOTE — Progress Notes (Signed)
Nutrition Follow-up  DOCUMENTATION CODES:   Severe malnutrition in context of chronic illness, Underweight  INTERVENTION:   Continue to encourage supplements: Ensure Enlive po BID, each supplement provides 350 kcal and 20 grams of protein RD to continue to monitor plan  NUTRITION DIAGNOSIS:   Malnutrition related to cancer and cancer related treatments as evidenced by percent weight loss, severe depletion of body fat, severe depletion of muscle mass.  Ongoing.  GOAL:   Patient will meet greater than or equal to 90% of their needs  Progressing.  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 Pt explains that pain is throbbing and constant, 10/10 in severity and with no specific alleviating or aggravating factors, occasionally but not consistently radiating to the left arm area, associated with swelling in that area and hand and arm tingling and numbness.  Pt was weighed for this admission, he has had 58 lb weight loss since 07/02/14 (31% weight loss x 10 months, significant for time frame).  Pt has been refusing medications and supplements, however he did accept one Ensure yesterday. Pt is in involved in decisions on where he is to go after leaving the hospital. Per provider notes, pt has a poor prognosis and is not accepting this.   Will continue to have Ensure ordered for patient. Pt eating ~25-75% of meals at this time which is a slight improvement.  Labs reviewed.  Diet Order:  Diet regular Room service appropriate?: Yes; Fluid consistency:: Thin  Skin:  Reviewed, no issues  Last BM:  8/28  Height:   Ht Readings from  Last 1 Encounters:  04/01/15 6' 4.5" (1.943 m)    Weight:   Wt Readings from Last 1 Encounters:  04/10/15 130 lb 9.6 oz (59.24 kg)    Ideal Body Weight:  94.5 kg  BMI:  Body mass index is 15.69 kg/(m^2).  Estimated Nutritional Needs:   Kcal:  2100-2300  Protein:  100-110g  Fluid:  2.1L/day  EDUCATION NEEDS:   No education needs identified at this time  Clayton Bibles, MS, RD, LDN Pager: [phone] After Hours Pager: [phone]

## 2015-04-16 NOTE — Progress Notes (Signed)
Daily Progress Note   Patient Name: Frank Ward       Date: 04/16/2015 DOB: 1971-04-15  Age: 44 y.o. MRN#: [id number] Attending Physician: Reyne Dumas, MD Primary Care Physician: No PCP Per Patient Admit Date: 04/01/2015  Reason for Consultation/Follow-up:  GOC and symptom management   Subjective:  -patient refused to engage in conversation today regarding pain management strategies or treatment plan.  Capacity waxes and wanes depending on the day, psychiatry has been involved multiple times   -a  multidisciplinary meeting was held today attempting to secure a patient centered plan of care,   Oncology, hospitalist, Psychiatry, SW and myself representing Palliative Medicine were represented.  Family were a no show for the 1000 meeting and again at 63  - according to oncology, Frank Ward has incurable thryoid cancer, believing his prognosis is  weeks, and he is a good candidate for [employer].   - today the patient is not acceptable of his prognosis, and he is unable/unwilling to have a meaningful conversation with his providers. No viable  medical interventions available at this time,  providers involved in his care believe comfort and safety should be the focus of care   -complicated situation    Length of Stay: 15 days  Current Medications: Scheduled Meds:  . dexamethasone  4 mg Intravenous 4 times per day  . feeding supplement (ENSURE ENLIVE)  237 mL Oral TID BM  . fentaNYL  75 mcg Transdermal Q72H  . morphine  60 mg Oral Q12H  . polyethylene glycol  17 g Oral BID  . QUEtiapine  100 mg Oral Daily  . senna-docusate  1 tablet Oral BID    Continuous Infusions:    PRN Meds: bisacodyl, calcium carbonate, chlorpheniramine-HYDROcodone, haloperidol lactate, LORazepam, morphine, MUSCLE RUB, ondansetron **OR** ondansetron (ZOFRAN) IV, traZODone  Palliative Performance Scale: 30 % at best      Vital Signs: BP 124/64 mmHg  Pulse 81  Temp(Src) 98.1 F  (36.7 C) (Oral)  Resp 18  Ht 6' 4.5" (1.943 m)  Wt 59.24 kg (130 lb 9.6 oz)  BMI 15.69 kg/m2  SpO2 99% SpO2: SpO2: 99 % O2 Device: O2 Device: Not Delivered O2 Flow Rate:    Intake/output summary:  Intake/Output Summary (Last 24 hours) at 04/16/15 1525 Last data filed at 04/16/15 1335  Gross per 24 hour  Intake    990 ml  Output   1250 ml  Net   -260 ml   LBM: Last BM Date: 04/12/15 Baseline Weight: Weight: 59.24 kg (130 lb 9.6 oz) Most recent weight: Weight: 59.24 kg (130 lb 9.6 oz)    Additional Data Reviewed: Recent Labs     04/15/15  1110  04/16/15  0432  WBC   --   9.4  HGB   --   8.6*  PLT   --   512*  NA  140   --   BUN  14   --   CREATININE  0.73   --      Problem List:  Patient Active Problem List   Diagnosis Date Noted  . Diplopia 04/12/2015  . Tinnitus of right ear 04/12/2015  . Constipation 04/09/2015  . Anemia in neoplastic disease 04/08/2015  . Adjustment disorder with mixed anxiety and depressed mood 04/07/2015  . Intractable pain 04/01/2015  . Cancer   . Nausea with vomiting 01/28/2015  . Weight loss 01/28/2015  . Pathological fracture in neoplastic disease 02/17/2014  . Noncompliance 02/16/2014  . Palliative care  by specialist 02/14/2014  . Anxiety state, unspecified 02/14/2014  . Steroid-induced hyperglycemia 02/13/2014  . Foot ulcer 02/13/2014  . Ulcer of left lower leg 02/07/2014  . 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 Bilateral lower extremity edema 01/07/2014  . Numbness and tingling of feet 01/07/2014  . Weakness generalized 12/31/2013  . Cancer associated pain 12/31/2013  . Palliative care encounter 12/31/2013  . Secondary malignant neoplasm of bone and bone marrow 09/16/2013  . Metastatic cancer to brain 08/30/2013  . Primary malignant neoplasm of thyroid gland metastatic to bone 08/29/2013  . Cervical spine fracture 08/28/2013  . Hypertension   . Thyroid disease   . HAMMER TOE, OTHER, ACQUIRED 06/04/2007      Palliative Care Assessment & Plan    Code Status:  Full code   Symptom Management:   Pain: Dr Abrol/hospitalist is writing for pain management.  Up to this time patient has been refusing any medication recommendations.  In order to minimize multiple providers, I will only involve myself as requested by the primary team.     Prognosis: Likely less than 4 weeks  Discharge Planning: Pending  Thank you for allowing the Palliative Medicine Team to assist in the care of this patient.   Time In: 1000 Time Out: 1100 Total Time 60 min Prolonged Time Billed no     Greater than 50%  of this time was spent counseling and coordinating care related to the above assessment and plan.     Knox Royalty, NP  04/16/2015, 3:25 PM  Please contact Palliative Medicine Team phone at [phone] for questions and concerns.

## 2015-04-16 NOTE — Progress Notes (Addendum)
Progress Note   Frank Ward Ward HBZ:[id number] DOB: November 27, 1970 DOA: 04/01/2015 PCP: No PCP Per Patient   Brief Narrative:   Frank Ward Ward is Frank Ward 44 y.o. 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 Patient underwent psychiatric evaluation 04/07/15 secondary to refusal of radiation treatments and was diagnosed with adjustment disorder with mixed anxiety and depressed mood. He was felt to have capacity to make his own medical decisions. He is currently undergoing ongoing radiation treatments and the radiation oncologist is considering radioactive iodine therapy however his TSH needs to be greater than 30 before this can be done. Synthroid on hold pending further treatment recommendations by radiation oncologist.  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 - Status post thyroidectomy (01/29/13) as well as ablative radioactive iodine. - Has been noncompliant with treatment planning as Frank Ward outpatient with failure to follow-up on multiple occasions. Discontinue IV Dilaudid as this may be offering him Frank Ward incentive to continue to stay in the hospital. Refuses to consider a PCA for better pain control, or take oral medications/patches . - Pain control efforts limited by refusal of oral pain medications.  - Spoke with Dr. Sondra Come 04/09/15 regarding possibility of treatment with additional  radioactive iodine.  - TSH elevated. Needs to be greater than 30 for radioactive iodine treatment. Hold Synthroid. Repeat TSH 04/13/15 18.9. - Dr. Sondra Come continues  to follow him and is considering additional radiation treatments to his left shoulder area.  We had a multidisciplinary meeting on 9/1 with oncology. According to oncology assessment,Frank Ward Ward has incurable thryoid cancer. His prognosis is, in my opinion, weeks. He is a good candidate for [employer]. Patient is not acceptable of his prognosis. Patient is not able to have a meaningful conversation with his providers, make eye to eye contact, try to analyze the situation given his psychiatric issues. Patient lacks capacity at the time of my visit today. It would be really helpful to get psychiatry to determine capacity/competency long-term. At this point we should focus on pain management as patient has ran out of all treatment options, except outpatient radiation therapy to the left shoulder/brain?. This decision  is deferred to radiation oncology. Strongly recommend [place] placement at this time.     Fall-patient very noncompliant , discontinued sitter, multiple findings on CT scan of the cervical spine and CT of the head but no acute trauma.    Diplopia and tinnitis - Reports a 1 week history of diplopia and a ringing in his right ear (but didn't tell anyone). - MRI brain shows enlarging metastasis with vasogenic edema. IV Decadron started 04/13/15.Defer further management to Dr. Sondra Come     Hypothyroidism - Holding Synthroid as noted above.    Opiod-induced constipation - Continue Senokot, Colace and MiraLAX.    Severe protein calorie malnutrition with cachexia - Continue Ensure supplements. Dietitian following. - Current weight 130 pounds (59.24 kg), Frank Ward 8 pound weight loss from 03/03/15.    Noncompliance - Related to underlying depression/anxiety and loss of control in this young man facing a terminal diagnosis. - Status post psychiatry evaluation where he was found to have capacity. -  Mood remains somewhat labile. Patient is incoherent and has no insight into his current  medical condition.    Adjustment disorder with mixed anxiety and depressed mood - Refuses to take Seroquel, continue Ativan and trazodone as needed. - Found to have capacity per psychiatrist.I feel that patient clearly lacks the ability to care for himself primarily and has no insight into his clinical situation because of a combination of medications such as narcotics and Decadron, brain metastasis, psychiatric issues.  -    Anemia in neoplastic disease - No current indication for transfusion.    DVT Prophylaxis - Lovenox ordered but has been refusing to take it.  Family Communication: No family present at the bedside. Declines my offer to call. Disposition Plan: Home versus SNF or residential hospice when stable and pain controlled.  Will be challenging to discharge him to a safe environment given his mental health issues and lack of family support (although he does have a mother and a brother that live locally).    Code Status:     Code Status Orders        Start     Ordered   04/01/15 1930  Full code   Continuous     04/01/15 1929      IV Access:    Peripheral IV   Procedures and diagnostic studies:   Ct Head Wo Contrast  04/15/2015   CLINICAL DATA:  Status post fall out of bed. Hit left temporal region, with laceration. Combative. Metastatic thyroid cancer. Initial encounter.  EXAM: CT HEAD WITHOUT CONTRAST  CT CERVICAL SPINE WITHOUT CONTRAST  TECHNIQUE: Multidetector CT imaging of the head and cervical spine was performed following the standard protocol without intravenous contrast. Multiplanar CT image reconstructions of the cervical spine were also generated.  COMPARISON:  MRI of the brain performed 04/13/2015, and CT of the head performed 02/06/2014. CT of the cervical spine performed 07/26/2013  FINDINGS: CT HEAD FINDINGS  A 1.6 x 1.4 cm high attenuation mass is again noted at the high left parietal lobe, with diffusely decreased white matter attenuation involving  much of the left parietal and frontal lobes. A complex mass is again noted at the fourth ventricle, measuring 3.0 x 1.7 cm. There is minimal rightward midline shift, measuring 3-4 mm.  Prominence of the ventricles and sulci reflects mild cortical volume loss. Periventricular white matter change likely reflects small vessel ischemic microangiopathy.  There is no evidence of fracture; visualized osseous structures are unremarkable in appearance. The orbits are within normal limits. There is opacification of the right frontal sinus. Mild mucosal thickening is noted at the left maxillary sinus. The remaining paranasal sinuses and mastoid air cells are well-aerated. No significant soft tissue abnormalities are seen.  CT CERVICAL SPINE FINDINGS  Since the prior study in 2014, there has been further collapse of the pathologic fractures of C3 and C4, with approximately 4 mm of retropulsion noted at C3, and narrowing of the spinal canal to 6-7 mm in AP dimension on sagittal images. There is corresponding mild grade 1 retrolisthesis of C2 on C3. Frank Ward apparent chronic lesion is noted at the inferior endplate of T1, grossly unchanged in appearance. Prevertebral soft tissues are within normal limits.  The thyroid gland is not well seen. The visualized lung apices are clear. No significant soft tissue abnormalities are seen.  IMPRESSION: 1. No evidence of traumatic intracranial injury or fracture. 2. Further collapse of the pathologic fractures of C3 and C4 since 2014, with approximately 4 mm of  the retropulsion noted at C3, and narrowing of the spinal canal to 6-7 mm in AP dimension on sagittal images. Corresponding mild grade 1 retrolisthesis of C2 on C3. 3. Complex mass again noted at the fourth ventricle, measuring 3.0 x 1.7 cm. 4. 1.6 x 1.4 cm high attenuation mass again noted at the high left parietal lobe, with diffusely decreased white matter attenuation involving much of the left parietal and frontal lobes. Minimal  associated rightward midline shift, measuring 3-4 mm. 5. Apparent chronic lesion again noted at the inferior endplate of C1, stable from 2014. 6. Mild cortical volume loss and scattered small vessel ischemic microangiopathy. 7. Opacification of the right frontal sinus. Mild mucosal thickening at the left maxillary sinus. These results were called by telephone at the time of interpretation on 04/15/2015 at 4:42 am to [facility] on [place], who verbally acknowledged these results.   Electronically Signed   By: Garald Balding M.D.   On: 04/15/2015 04:42   Ct Chest Wo Contrast  04/04/2015   CLINICAL DATA:  History of metastatic thyroid cancer  EXAM: CT CHEST WITHOUT CONTRAST  TECHNIQUE: Multidetector CT imaging of the chest was performed following the standard protocol without IV contrast.  COMPARISON:  12/24/2014  FINDINGS: The lungs are well aerated bilaterally. The moderate right-sided pleural effusion is again identified stable from the previous exam. No significant left pleural effusion is seen. There are multiple too-numerous-to-count pulmonary mass is identified bilaterally. Largest of these on the left measures approximately 3.4 cm and is stable in size from the prior exam. Conglomeration of nodules is noted centrally. No significant hilar or mediastinal adenopathy is noted.  The bony structures show multiple areas of metastatic disease. Frank Ward expansile lytic lesion is noted within the scapula on the right laterally just below the glenoid and extending into the inferior aspect of the glenoid. Additionally Frank Ward expansile lesion is noted within the right anterior chest wall. It measures approximately 5.8 cm in greatest dimension and is slightly enlarged from the prior exam. Multiple spinal lesions are seen. The areas of worst lytic destruction are noted in the left vertebral body at T10 as well as the left half of the vertebral body at T12 and within the pedicle and lamina and extending laterally from the L1. The  changes at L1 show encroachment and destruction of the posterior wall of the spinal canal although no definitive soft tissue component is seen within the canal. The lack of IV contrast somewhat limits evaluation. The remainder the upper abdomen is within normal limits with the exception of metastatic disease of the left adrenal gland. The adrenal nodule measures 2.7 cm increased from 1.9 cm on the prior exam.  IMPRESSION: Changes consistent with the given clinical history of metastatic thyroid cancer to the lungs and bones as well as the left adrenal gland. There is been some slight increase in the degree of bony abnormalities. The lung abnormalities are roughly stable from the prior exam.  Stable right-sided effusion.   Electronically Signed   By: Inez Catalina M.D.   On: 04/04/2015 14:16   Ct Cervical Spine Wo Contrast  04/15/2015   CLINICAL DATA:  Status post fall out of bed. Hit left temporal region, with laceration. Combative. Metastatic thyroid cancer. Initial encounter.  EXAM: CT HEAD WITHOUT CONTRAST  CT CERVICAL SPINE WITHOUT CONTRAST  TECHNIQUE: Multidetector CT imaging of the head and cervical spine was performed following the standard protocol without intravenous contrast. Multiplanar CT image reconstructions of the cervical spine were also  generated.  COMPARISON:  MRI of the brain performed 04/13/2015, and CT of the head performed 02/06/2014. CT of the cervical spine performed 07/26/2013  FINDINGS: CT HEAD FINDINGS  A 1.6 x 1.4 cm high attenuation mass is again noted at the high left parietal lobe, with diffusely decreased white matter attenuation involving much of the left parietal and frontal lobes. A complex mass is again noted at the fourth ventricle, measuring 3.0 x 1.7 cm. There is minimal rightward midline shift, measuring 3-4 mm.  Prominence of the ventricles and sulci reflects mild cortical volume loss. Periventricular white matter change likely reflects small vessel ischemic microangiopathy.   There is no evidence of fracture; visualized osseous structures are unremarkable in appearance. The orbits are within normal limits. There is opacification of the right frontal sinus. Mild mucosal thickening is noted at the left maxillary sinus. The remaining paranasal sinuses and mastoid air cells are well-aerated. No significant soft tissue abnormalities are seen.  CT CERVICAL SPINE FINDINGS  Since the prior study in 2014, there has been further collapse of the pathologic fractures of C3 and C4, with approximately 4 mm of retropulsion noted at C3, and narrowing of the spinal canal to 6-7 mm in AP dimension on sagittal images. There is corresponding mild grade 1 retrolisthesis of C2 on C3. Frank Ward apparent chronic lesion is noted at the inferior endplate of T1, grossly unchanged in appearance. Prevertebral soft tissues are within normal limits.  The thyroid gland is not well seen. The visualized lung apices are clear. No significant soft tissue abnormalities are seen.  IMPRESSION: 1. No evidence of traumatic intracranial injury or fracture. 2. Further collapse of the pathologic fractures of C3 and C4 since 2014, with approximately 4 mm of the retropulsion noted at C3, and narrowing of the spinal canal to 6-7 mm in AP dimension on sagittal images. Corresponding mild grade 1 retrolisthesis of C2 on C3. 3. Complex mass again noted at the fourth ventricle, measuring 3.0 x 1.7 cm. 4. 1.6 x 1.4 cm high attenuation mass again noted at the high left parietal lobe, with diffusely decreased white matter attenuation involving much of the left parietal and frontal lobes. Minimal associated rightward midline shift, measuring 3-4 mm. 5. Apparent chronic lesion again noted at the inferior endplate of C1, stable from 2014. 6. Mild cortical volume loss and scattered small vessel ischemic microangiopathy. 7. Opacification of the right frontal sinus. Mild mucosal thickening at the left maxillary sinus. These results were called by  telephone at the time of interpretation on 04/15/2015 at 4:42 am to [facility] on [place], who verbally acknowledged these results.   Electronically Signed   By: Garald Balding M.D.   On: 04/15/2015 04:42   Frank Ward Ward QV Contrast  04/13/2015   CLINICAL DATA:  Diplopia. Metastatic thyroid cancer. Osseous metastases.  EXAM: MRI HEAD WITHOUT AND WITH CONTRAST  TECHNIQUE: Multiplanar, multiecho pulse sequences of the brain and surrounding structures were obtained without and with intravenous contrast.  CONTRAST:  28mL MULTIHANCE GADOBENATE DIMEGLUMINE 529 MG/ML IV SOLN  COMPARISON:  MRI brain without and with contrast 05/09/2014.  FINDINGS: The a heterogeneous enhancing lesion centered in the fourth ventricle demonstrates marked interval enlargement acute and chronic blood products are evident within this lesion. There is significant increase in surrounding vasogenic edema. T2 changes extending to the cerebellar peduncle is bilaterally as well as the inferior colliculus on the right.  Although the lesion has enlarged and demonstrates intrinsic blood products, there is no obstructing hydrocephalus. Mild  ventricular enlargement is stable, compatible with the degree of atrophy.  A 7 mm enhancing lesion along the right choroid plexus any punctate lesion within the right central sulcus are stable.  A new enhancing lesion within the anterior left parietal lobe measures 1.4 x 1.9 x 1.6 cm. There is significant surrounding edema and mass effect with partial effacement of the left lateral ventricle. Midline shift measures 5 mm at the foramen of [place].  No other enhancing parenchymal lesions are evident. Pathologic fracture of the C3 vertebral body is again noted with posterior extension of tumor and bone resulting and significant central canal stenosis at the C3 level. Chronic collapse of the C4 vertebral body is noted as well.  No acute infarct is present. Flow is present in the major intracranial arteries. Chronic  opacification of the right frontal sinus is noted. A polyp or mucous retention cyst is again noted within the left maxillary sinus. The remaining paranasal sinuses are clear. Increased fluid is present in the left mastoid air cells. No obstructing nasopharyngeal lesion is present.  IMPRESSION: 1. Increased size of heterogeneous enhancing mass lesion within the fourth ventricle, now measuring 3.0 x 2.2 x 2.2 cm. The lesion demonstrates acute and chronic intrinsic blood products. 2. New enhancing metastatic lesion in the anterior left parietal lobe with significant surrounding vasogenic edema and mass effect. Midline shift is 5 mm. Intrinsic blood products are noted. 3. Stable focal enhancing lesions along the right choroid plexus and within the right central sulcus. 4. Moderate atrophy and diffuse white matter disease is otherwise stable. 5. Chronic right frontal sinus disease. 6. Left mastoid effusion. No obstructing nasopharyngeal lesion is evident.   Electronically Signed   By: San Morelle M.D.   On: 04/13/2015 12:34   Dg Chest Port 1 View  04/01/2015   CLINICAL DATA:  Left clavicle and shoulder pain. Thyroid cancer with metastatic bone disease.  EXAM: PORTABLE CHEST - 1 VIEW  COMPARISON:  CT chest 12/24/2014.  FINDINGS: The heart size is normal. Multiple bilateral pulmonary nodules are present bilaterally compatible with known metastatic disease. There has been some progression since the prior study. No focal airspace consolidation is evident otherwise.  Rightward curvature is present in the lower thoracic spine due to collapse pathologic left-sided fracture, unchanged. Frank Ward erosive lesion is again noted in the right scapula. No definite new lesions are evident.  IMPRESSION: 1. Progression of numerous pulmonary nodules compatible with known metastatic disease. 2. Rightward curvature of the thoracic spine is secondary to asymmetric left-sided pathologic fractures, unchanged. 3. Stable erosive lesion in  the right scapula.   Electronically Signed   By: San Morelle M.D.   On: 04/01/2015 16:19     Medical Consultants:    Radiation Oncology: [name], MD  Psychiatry: Ambrose Finland, MD  Oncology: Chauncey Cruel, MD  Anti-Infectives:    None.  Subjective:   Frank Ward Ward repeatedly asks providers to leave the room, as he has to go to the bathroom. He does not like being confronted with the reality of his current clinical situation. Continues to refuse by mouth medications and fentanyl patches   Objective:    Filed Vitals:   04/15/15 0340 04/15/15 0438 04/15/15 1328 04/16/15 0518  BP: 120/58 113/71 100/64 94/60  Pulse: 90 81 90 84  Temp: 98.7 F (37.1 C) 98.1 F (36.7 C) 97.4 F (36.3 C) 98.1 F (36.7 C)  TempSrc: Axillary Axillary Oral Oral  Resp: 18 20 20 16   Height:  Weight:      SpO2: 97% 98% 92% 98%    Intake/Output Summary (Last 24 hours) at 04/16/15 1236 Last data filed at 04/16/15 0840  Gross per 24 hour  Intake    750 ml  Output   1050 ml  Net   -300 ml    Exam: Gen: NAD Psych: Depressed affect Cardiovascular:  RRR, No M/R/G Respiratory:  Lungs diminished Gastrointestinal:  Abdomen soft, NT/ND, + BS Extremities:  No C/E/C   Data Reviewed:    Labs: Basic Metabolic Panel:  Recent Labs Lab 04/15/15 1110  NA 140  K 4.2  CL 105  CO2 27  GLUCOSE 141*  BUN 14  CREATININE 0.73  CALCIUM 9.1  MG 1.9   GFR Estimated Creatinine Clearance: 96.6 mL/min (by C-G formula based on Cr of 0.73).  CBC:  Recent Labs Lab 04/16/15 0432  WBC 9.4  HGB 8.6*  HCT 26.6*  MCV 89.0  PLT 512*   CBG:  Recent Labs Lab 04/13/15 1023  GLUCAP 77   Microbiology No results found for this or any previous visit (from the past 240 hour(s)).   Medications:   . dexamethasone  4 mg Intravenous 4 times per day  . feeding supplement (ENSURE ENLIVE)  237 mL Oral TID BM  . fentaNYL  75 mcg Transdermal Q72H  . morphine   60 mg Oral Q12H  . polyethylene glycol  17 g Oral BID  . QUEtiapine  100 mg Oral Daily  . senna-docusate  1 tablet Oral BID   Continuous Infusions:   Time spent: 25 minutes.   LOS: 15 days   [facility]  [facility] Pager [phone]  *Please refer to [email or web address], password [email or web address] to get updated schedule on who will round on this patient, as hospitalists switch teams weekly. If 7PM-7AM, please contact night-coverage at [email or web address], password [email or web address] for any overnight needs.  04/16/2015, 12:36 PM

## 2015-04-16 NOTE — Progress Notes (Signed)
PT Cancellation Note  Patient Details Name: Frank Ward MRN: [id number] DOB: 1970/12/14   Cancelled Treatment:    Reason Eval/Treat Not Completed: Other (comment) (spoke with RN who reports patient is not participating. will check back  on 9/3.)   Marcelino Freestone PT [phone]  04/16/2015, 4:41 PM

## 2015-04-16 NOTE — Progress Notes (Signed)
  Radiation Oncology         [phone] ________________________________  Name: Frank Ward MRN: [id number]  Date: 04/16/2015  DOB: 1970/12/11  Documentation Note  CC: No PCP Per Patient  No ref. 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 Per oncology's note,  family members were not present for the conference earlier today. Earlier today the patient was fixated on receiving radiation treatments to his left clavicle however this evening he does not wish to consider this treatment. Patient has been scheduled for 3-4 tiems for set up of his left clavicle treatment but has refused to be transported to the radiation oncology Department for planning.  Patient may be a potential candidate for radioactive iodine 131 therapy however his TSH level needs to be at least 30. He has been off thyroid supplement for several days and for some reason his TSH has gone down over the past 2 days. He is therefore not a candidate for this therapy at this time.  The patient has progression of his disease in the brain. His case was presented at the [facility] clinic earlier week. Conceivably the patient will be a candidate for SRS treatments for his brain lesion however given the patient's situation and likely intolerance of simulation (~2 hours), this therapy was not recommended.  This evening the patient is fixated on the fact that his pain medications have been changed and wishes to speak with his nurse concerning this issue. I have spoken with Frank Ward's nurse about this issue and she will discuss further this evening.  I will be out of the office until September 6. I would be glad to meet with the patient at that time if he is not discharged. I agree with oncology's recommendations for [facility]   ____________________________________ [name], MD

## 2015-04-16 NOTE — Care Management Important Message (Signed)
Important Message  Patient Details  Name: Frank Ward MRN: [id number] Date of Birth: 01-03-1971   Medicare Important Message Given:  Yes-second notification given    [name] 04/16/2015, 12:58 [place] Message  Patient Details  Name: Frank Ward MRN: [id number] Date of Birth: 20-May-1971   Medicare Important Message Given:  Yes-second notification given    [name] 04/16/2015, 12:58 PM

## 2015-04-16 NOTE — Progress Notes (Signed)
I met with Frank Ward team today to discuss his difficult situation.  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 His prognosis is, in my opinion, weeks. He is a good candidate for [employer].  However the patient does not accept his prognosis. He is not able to deal practically with his situation. 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 Despite his being unable to help [place] make decisions with him, we also cannot make decisions for him because legally he is not incompetent.  We have tried to bring his family in but they did not show for today's meeting. In the 2+ years ai have known Frank Ward I have not met his family. They have their own health limitations and the patient appears to have alienated them as well--though I do not have a clear idea of their relationship.  Frank Ward is fixated on receiving radiation to his left shoulder. I have explained that can be done on an outpatient basis (if it is to be done). He has a 2 [address]roon apartment, which is however unfurnished. We could help provide basics-- a hospital bed, BSC, etc.-- but there would have to be significant buy-in from the patient's family in terms of providing care for Frank Ward-- who is not in my opinion competent to cook his own meals, for instance, is a high fall risk, etc.  If the patient accepted [facility] or an SNF, he could be safe and comfortable and this would not prejudge the issue whether radiation or any other treatment would be of help.  If we antagonize Frank Ward and cause him to sign himself out AMA, he has no place to go at present and he will be back in the ED within 24 hours.   A this point suggest: adjust patient's pain medications to a regimen that can be easily m,aintained as outpatient (whether in pt's home or SNF).  Discuss situation with family and determine to what extent they will be able to provide the patient support if he is to be discharged to his own  apartment.  I am not sure if a repeat psychiatric evaluation would be helpful.   Will follow with you.

## 2015-04-16 NOTE — Progress Notes (Signed)
Patient initially refused to take PO pain medication and Fentanyl patch.  He states that he wants to continue IV Dilaudid while he is in the hospital.  He also stated he is waiting to get radiation.  Clotilde Dieter offered PO meds multiple times and he refused.  We wasted the initial medication pulled from [facility] then patient called me back to the room and stated he would take the pills. I was able to get him to take the PO meds at this time.  He continues to refuse the patch.

## 2015-04-17 MED ORDER — BISACODYL 10 MG RE SUPP: 10.0000 mg | Freq: Every day | RECTAL | Status: AC | PRN

## 2015-04-17 MED ORDER — HYDROCOD POLST-CPM POLST ER 10-8 MG/5ML PO SUER: 5.0000 mL | Freq: Two times a day (BID) | ORAL | Status: AC | PRN

## 2015-04-17 MED ORDER — LORAZEPAM 1 MG PO TABS: 1.0000 mg | ORAL_TABLET | Freq: Three times a day (TID) | ORAL | Status: AC | PRN

## 2015-04-17 MED ORDER — MORPHINE SULFATE 15 MG PO TABS: 15.0000 mg | ORAL_TABLET | ORAL | Status: AC | PRN

## 2015-04-17 MED ORDER — MORPHINE SULFATE ER 60 MG PO TBCR: 60.0000 mg | EXTENDED_RELEASE_TABLET | Freq: Two times a day (BID) | ORAL | Status: AC

## 2015-04-17 MED ORDER — FENTANYL 75 MCG/HR TD PT72: 75.0000 ug | MEDICATED_PATCH | TRANSDERMAL | Status: AC

## 2015-04-17 NOTE — Progress Notes (Signed)
CSW continuing to follow.   CSW received notification from [facility] CSW, Loren Racer that pt mother had contacted her and requesting to speak with inpatient CSW.   CSW contacted pt mother, Trudie Reed via telephone. CSW introduced self and explained role. CSW provided support to pt mother as pt mother discussed that it has been difficult for her to come to the hospital due to transportation barriers. Pt mother expressed that MD did contact her this morning and provided pt mother with information about pt diagnosis and poor prognosis. Pt mother discussed that she is still processing all of this information and that pt has family members coming into town this weekend including pt aunt who she plans to discuss with in order for pt family to make a decision regarding disposition. Pt mother discussed that she is aware that MD is recommending [facility] and she feels that this will likely be the best option for pt, but wants to be able to discuss all of the information that she received from MD today with other family members to make a final decision. CSW inquired with pt mother if she would be agreeable to CSW contacting [facility] to at least begin process of referral and then follow up with pt mother to confirm their final decision. Pt mother was agreeable to this.   CSW contacted [employer], Erling Conte and notified of situation and provided referral.   CSW to continue to follow to provide support to pt family as they are processing difficult information at this time and before today did not have a clear picture of pt prognosis.   Alison Murray, MSW, [facility] [phone]

## 2015-04-17 NOTE — Progress Notes (Addendum)
Progress Note   Jock Gaige Sebo BDZ:[id number] DOB: February 26, 1971 DOA: 04/01/2015 PCP: No PCP Per Patient   Brief Narrative:   Nery Frappier is an 44 y.o. 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 Patient underwent psychiatric evaluation 04/07/15 secondary to refusal of radiation treatments and was diagnosed with adjustment disorder with mixed anxiety and depressed mood. He was felt to have capacity to make his own medical decisions. He is currently undergoing ongoing radiation treatments and the radiation oncologist is considering radioactive iodine therapy however his TSH needs to be greater than 30 before this can be done. Synthroid on hold pending further treatment recommendations by radiation oncologist.  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 - Status post thyroidectomy (01/29/13) as well as ablative radioactive iodine. - Has been noncompliant with treatment planning as an outpatient with failure to follow-up on multiple occasions. Discontinue IV Dilaudid as this may be offering him an incentive to continue to stay in the hospital. Refuses to consider a PCA for better pain control, or take oral medications/patches . - Pain control efforts limited by refusal of oral pain medications.  - Spoke with Dr. Sondra Come 04/09/15 regarding possibility of treatment with additional  radioactive iodine.  - TSH elevated. Needs to be greater than 30 for radioactive iodine treatment. Hold Synthroid. Repeat TSH 04/13/15 18.9. - Dr. Sondra Come continues  to follow him and is considering additional radiation treatments to his left shoulder area.  We had a multidisciplinary meeting on 9/1 with oncology. According to oncology assessment,Mr Moomaw has incurable thryoid cancer. His prognosis is, in oncology 's opinion, is weeks. He is a good candidate for [employer]. Patient is not acceptable of his prognosis. Patient is not able to have a meaningful conversation with his providers, make eye to eye contact, try to analyze the situation given his psychiatric issues. Patient lacks capacity at the time of my visit today. It would be really helpful to get psychiatry to determine capacity/competency long-term. At this point we should focus on pain management as patient has ran out of all treatment options, except outpatient radiation therapy to the left shoulder/brain?. This decision  is deferred to radiation oncology. Strongly recommend [facility] placement at this time. [name], MD does not recommend any radiation treatments at this time. Given poor prognosis for oncology and radiation oncology, I discussed with the patient's mother extensively on the phone at [phone]. She has not made a final decision about [name] and would like to wait until Monday after talking to her entire family and let me know on Monday. She does say that it would be difficult for them to come to the hospital for another meeting.  Pain management-continue Duragesic patch, morphine sulfate immediate release, MS Contin    Fall-patient very noncompliant , discontinued sitter, multiple findings on CT scan of the cervical spine and CT of the head but no acute trauma.    Diplopia and tinnitis - Reports a 1 week history of diplopia and a ringing in his right ear (but didn't tell anyone). - MRI brain shows enlarging metastasis with vasogenic edema. IV Decadron  started 04/13/15.Defer further management to Dr. Sondra Come . At this time no further treatments are planned     Hypothyroidism - Holding Synthroid as noted above.    Opiod-induced constipation - Continue Senokot, Colace and MiraLAX.    Severe protein calorie malnutrition with cachexia - Continue Ensure supplements. Dietitian following. - Current weight 130 pounds (59.24 kg), an 8 pound weight loss from 03/03/15.    Noncompliance - Related to underlying depression/anxiety and loss of control in this young man facing a terminal diagnosis. - Status post psychiatry evaluation where he was found to have capacity. - Mood remains somewhat labile. Patient is incoherent and has no insight into his current medical condition.    Adjustment disorder with mixed anxiety and depressed mood - Refuses to take Seroquel, continue Ativan and trazodone as needed. - Found to have capacity per psychiatrist.I feel that patient clearly lacks the ability to care for himself primarily and has no insight into his clinical situation because of a combination of medications such as narcotics and Decadron, brain metastasis, psychiatric issues.  -    Anemia in neoplastic disease - No current indication for transfusion.    DVT Prophylaxis - Lovenox ordered but has been refusing to take it.  Family Communication: No family present at the bedside. Declines my offer to call. Disposition Plan: Home versus SNF or residential hospice when stable and pain controlled.  Will be challenging to discharge him to a safe environment given his mental health issues and lack of family support (although he does have a mother and a brother that live locally).    Code Status:     Code Status Orders        Start     Ordered   04/01/15 1930  Full code   Continuous     04/01/15 1929      IV Access:    Peripheral IV   Procedures and diagnostic studies:   Ct Head Wo Contrast  04/15/2015   CLINICAL DATA:  Status post fall out of bed. Hit left temporal region, with laceration. Combative. Metastatic thyroid cancer. Initial encounter.   EXAM: CT HEAD WITHOUT CONTRAST  CT CERVICAL SPINE WITHOUT CONTRAST  TECHNIQUE: Multidetector CT imaging of the head and cervical spine was performed following the standard protocol without intravenous contrast. Multiplanar CT image reconstructions of the cervical spine were also generated.  COMPARISON:  MRI of the brain performed 04/13/2015, and CT of the head performed 02/06/2014. CT of the cervical spine performed 07/26/2013  FINDINGS: CT HEAD FINDINGS  A 1.6 x 1.4 cm high attenuation mass is again noted at the high left parietal lobe, with diffusely decreased white matter attenuation involving much of the left parietal and frontal lobes. A complex mass is again noted at the fourth ventricle, measuring 3.0 x 1.7 cm. There is minimal rightward midline shift, measuring 3-4 mm.  Prominence of the ventricles and sulci reflects mild cortical volume loss. Periventricular white matter change likely reflects small vessel ischemic microangiopathy.  There is no evidence of fracture; visualized osseous structures are unremarkable in appearance. The orbits are within normal limits. There is opacification of the right frontal sinus. Mild mucosal thickening is noted at the left maxillary sinus. The remaining paranasal sinuses and mastoid air cells are well-aerated. No significant soft tissue abnormalities are seen.  CT CERVICAL SPINE FINDINGS  Since the prior study in 2014, there has been further collapse of the pathologic fractures of C3 and C4, with approximately 4 mm of retropulsion noted at  C3, and narrowing of the spinal canal to 6-7 mm in AP dimension on sagittal images. There is corresponding mild grade 1 retrolisthesis of C2 on C3. An apparent chronic lesion is noted at the inferior endplate of T1, grossly unchanged in appearance. Prevertebral soft tissues are within normal limits.  The thyroid gland is not well seen. The visualized lung apices are clear. No significant soft tissue abnormalities are seen.  IMPRESSION:  1. No evidence of traumatic intracranial injury or fracture. 2. Further collapse of the pathologic fractures of C3 and C4 since 2014, with approximately 4 mm of the retropulsion noted at C3, and narrowing of the spinal canal to 6-7 mm in AP dimension on sagittal images. Corresponding mild grade 1 retrolisthesis of C2 on C3. 3. Complex mass again noted at the fourth ventricle, measuring 3.0 x 1.7 cm. 4. 1.6 x 1.4 cm high attenuation mass again noted at the high left parietal lobe, with diffusely decreased white matter attenuation involving much of the left parietal and frontal lobes. Minimal associated rightward midline shift, measuring 3-4 mm. 5. Apparent chronic lesion again noted at the inferior endplate of C1, stable from 2014. 6. Mild cortical volume loss and scattered small vessel ischemic microangiopathy. 7. Opacification of the right frontal sinus. Mild mucosal thickening at the left maxillary sinus. These results were called by telephone at the time of interpretation on 04/15/2015 at 4:42 am to [facility] on [place], who verbally acknowledged these results.   Electronically Signed   By: Garald Balding M.D.   On: 04/15/2015 04:42   Ct Chest Wo Contrast  04/04/2015   CLINICAL DATA:  History of metastatic thyroid cancer  EXAM: CT CHEST WITHOUT CONTRAST  TECHNIQUE: Multidetector CT imaging of the chest was performed following the standard protocol without IV contrast.  COMPARISON:  12/24/2014  FINDINGS: The lungs are well aerated bilaterally. The moderate right-sided pleural effusion is again identified stable from the previous exam. No significant left pleural effusion is seen. There are multiple too-numerous-to-count pulmonary mass is identified bilaterally. Largest of these on the left measures approximately 3.4 cm and is stable in size from the prior exam. Conglomeration of nodules is noted centrally. No significant hilar or mediastinal adenopathy is noted.  The bony structures show multiple areas of  metastatic disease. An expansile lytic lesion is noted within the scapula on the right laterally just below the glenoid and extending into the inferior aspect of the glenoid. Additionally an expansile lesion is noted within the right anterior chest wall. It measures approximately 5.8 cm in greatest dimension and is slightly enlarged from the prior exam. Multiple spinal lesions are seen. The areas of worst lytic destruction are noted in the left vertebral body at T10 as well as the left half of the vertebral body at T12 and within the pedicle and lamina and extending laterally from the L1. The changes at L1 show encroachment and destruction of the posterior wall of the spinal canal although no definitive soft tissue component is seen within the canal. The lack of IV contrast somewhat limits evaluation. The remainder the upper abdomen is within normal limits with the exception of metastatic disease of the left adrenal gland. The adrenal nodule measures 2.7 cm increased from 1.9 cm on the prior exam.  IMPRESSION: Changes consistent with the given clinical history of metastatic thyroid cancer to the lungs and bones as well as the left adrenal gland. There is been some slight increase in the degree of bony abnormalities. The lung abnormalities are  roughly stable from the prior exam.  Stable right-sided effusion.   Electronically Signed   By: Inez Catalina M.D.   On: 04/04/2015 14:16   Ct Cervical Spine Wo Contrast  04/15/2015   CLINICAL DATA:  Status post fall out of bed. Hit left temporal region, with laceration. Combative. Metastatic thyroid cancer. Initial encounter.  EXAM: CT HEAD WITHOUT CONTRAST  CT CERVICAL SPINE WITHOUT CONTRAST  TECHNIQUE: Multidetector CT imaging of the head and cervical spine was performed following the standard protocol without intravenous contrast. Multiplanar CT image reconstructions of the cervical spine were also generated.  COMPARISON:  MRI of the brain performed 04/13/2015, and CT of  the head performed 02/06/2014. CT of the cervical spine performed 07/26/2013  FINDINGS: CT HEAD FINDINGS  A 1.6 x 1.4 cm high attenuation mass is again noted at the high left parietal lobe, with diffusely decreased white matter attenuation involving much of the left parietal and frontal lobes. A complex mass is again noted at the fourth ventricle, measuring 3.0 x 1.7 cm. There is minimal rightward midline shift, measuring 3-4 mm.  Prominence of the ventricles and sulci reflects mild cortical volume loss. Periventricular white matter change likely reflects small vessel ischemic microangiopathy.  There is no evidence of fracture; visualized osseous structures are unremarkable in appearance. The orbits are within normal limits. There is opacification of the right frontal sinus. Mild mucosal thickening is noted at the left maxillary sinus. The remaining paranasal sinuses and mastoid air cells are well-aerated. No significant soft tissue abnormalities are seen.  CT CERVICAL SPINE FINDINGS  Since the prior study in 2014, there has been further collapse of the pathologic fractures of C3 and C4, with approximately 4 mm of retropulsion noted at C3, and narrowing of the spinal canal to 6-7 mm in AP dimension on sagittal images. There is corresponding mild grade 1 retrolisthesis of C2 on C3. An apparent chronic lesion is noted at the inferior endplate of T1, grossly unchanged in appearance. Prevertebral soft tissues are within normal limits.  The thyroid gland is not well seen. The visualized lung apices are clear. No significant soft tissue abnormalities are seen.  IMPRESSION: 1. No evidence of traumatic intracranial injury or fracture. 2. Further collapse of the pathologic fractures of C3 and C4 since 2014, with approximately 4 mm of the retropulsion noted at C3, and narrowing of the spinal canal to 6-7 mm in AP dimension on sagittal images. Corresponding mild grade 1 retrolisthesis of C2 on C3. 3. Complex mass again noted  at the fourth ventricle, measuring 3.0 x 1.7 cm. 4. 1.6 x 1.4 cm high attenuation mass again noted at the high left parietal lobe, with diffusely decreased white matter attenuation involving much of the left parietal and frontal lobes. Minimal associated rightward midline shift, measuring 3-4 mm. 5. Apparent chronic lesion again noted at the inferior endplate of C1, stable from 2014. 6. Mild cortical volume loss and scattered small vessel ischemic microangiopathy. 7. Opacification of the right frontal sinus. Mild mucosal thickening at the left maxillary sinus. These results were called by telephone at the time of interpretation on 04/15/2015 at 4:42 am to [facility] on [place], who verbally acknowledged these results.   Electronically Signed   By: Garald Balding M.D.   On: 04/15/2015 04:42   Mr Jeri Cos OE Contrast  04/13/2015   CLINICAL DATA:  Diplopia. Metastatic thyroid cancer. Osseous metastases.  EXAM: MRI HEAD WITHOUT AND WITH CONTRAST  TECHNIQUE: Multiplanar, multiecho pulse sequences of the brain  and surrounding structures were obtained without and with intravenous contrast.  CONTRAST:  71mL MULTIHANCE GADOBENATE DIMEGLUMINE 529 MG/ML IV SOLN  COMPARISON:  MRI brain without and with contrast 05/09/2014.  FINDINGS: The a heterogeneous enhancing lesion centered in the fourth ventricle demonstrates marked interval enlargement acute and chronic blood products are evident within this lesion. There is significant increase in surrounding vasogenic edema. T2 changes extending to the cerebellar peduncle is bilaterally as well as the inferior colliculus on the right.  Although the lesion has enlarged and demonstrates intrinsic blood products, there is no obstructing hydrocephalus. Mild ventricular enlargement is stable, compatible with the degree of atrophy.  A 7 mm enhancing lesion along the right choroid plexus any punctate lesion within the right central sulcus are stable.  A new enhancing lesion within the  anterior left parietal lobe measures 1.4 x 1.9 x 1.6 cm. There is significant surrounding edema and mass effect with partial effacement of the left lateral ventricle. Midline shift measures 5 mm at the foramen of [place].  No other enhancing parenchymal lesions are evident. Pathologic fracture of the C3 vertebral body is again noted with posterior extension of tumor and bone resulting and significant central canal stenosis at the C3 level. Chronic collapse of the C4 vertebral body is noted as well.  No acute infarct is present. Flow is present in the major intracranial arteries. Chronic opacification of the right frontal sinus is noted. A polyp or mucous retention cyst is again noted within the left maxillary sinus. The remaining paranasal sinuses are clear. Increased fluid is present in the left mastoid air cells. No obstructing nasopharyngeal lesion is present.  IMPRESSION: 1. Increased size of heterogeneous enhancing mass lesion within the fourth ventricle, now measuring 3.0 x 2.2 x 2.2 cm. The lesion demonstrates acute and chronic intrinsic blood products. 2. New enhancing metastatic lesion in the anterior left parietal lobe with significant surrounding vasogenic edema and mass effect. Midline shift is 5 mm. Intrinsic blood products are noted. 3. Stable focal enhancing lesions along the right choroid plexus and within the right central sulcus. 4. Moderate atrophy and diffuse white matter disease is otherwise stable. 5. Chronic right frontal sinus disease. 6. Left mastoid effusion. No obstructing nasopharyngeal lesion is evident.   Electronically Signed   By: San Morelle M.D.   On: 04/13/2015 12:34   Dg Chest Port 1 View  04/01/2015   CLINICAL DATA:  Left clavicle and shoulder pain. Thyroid cancer with metastatic bone disease.  EXAM: PORTABLE CHEST - 1 VIEW  COMPARISON:  CT chest 12/24/2014.  FINDINGS: The heart size is normal. Multiple bilateral pulmonary nodules are present bilaterally compatible with  known metastatic disease. There has been some progression since the prior study. No focal airspace consolidation is evident otherwise.  Rightward curvature is present in the lower thoracic spine due to collapse pathologic left-sided fracture, unchanged. An erosive lesion is again noted in the right scapula. No definite new lesions are evident.  IMPRESSION: 1. Progression of numerous pulmonary nodules compatible with known metastatic disease. 2. Rightward curvature of the thoracic spine is secondary to asymmetric left-sided pathologic fractures, unchanged. 3. Stable erosive lesion in the right scapula.   Electronically Signed   By: San Morelle M.D.   On: 04/01/2015 16:19     Medical Consultants:    Radiation Oncology: [name], MD  Psychiatry: Ambrose Finland, MD  Oncology: Chauncey Cruel, MD  Anti-Infectives:    None.  Subjective:   Nizar Danelle Earthly patient states that  his pain is under better control today than yesterday  Objective:    Filed Vitals:   04/15/15 1328 04/16/15 0518 04/16/15 1334 04/17/15 0500  BP:  94/60 124/64 96/55  Pulse: 90 84 81 71  Temp: 97.4 F (36.3 C) 98.1 F (36.7 C) 98.1 F (36.7 C) 98.4 F (36.9 C)  TempSrc: Oral Oral Oral Oral  Resp: 20 16 18 16   Height:      Weight:      SpO2: 92% 98% 99% 100%    Intake/Output Summary (Last 24 hours) at 04/17/15 1116 Last data filed at 04/17/15 0501  Gross per 24 hour  Intake    240 ml  Output    425 ml  Net   -185 ml    Exam: Gen: NAD Psych: Depressed affect Cardiovascular:  RRR, No M/R/G Respiratory:  Lungs diminished Gastrointestinal:  Abdomen soft, NT/ND, + BS Extremities:  No C/E/C   Data Reviewed:    Labs: Basic Metabolic Panel:  Recent Labs Lab 04/15/15 1110  NA 140  K 4.2  CL 105  CO2 27  GLUCOSE 141*  BUN 14  CREATININE 0.73  CALCIUM 9.1  MG 1.9   GFR Estimated Creatinine Clearance: 96.6 mL/min (by C-G formula based on Cr of  0.73).  CBC:  Recent Labs Lab 04/16/15 0432  WBC 9.4  HGB 8.6*  HCT 26.6*  MCV 89.0  PLT 512*   CBG:  Recent Labs Lab 04/13/15 1023  GLUCAP 77   Microbiology No results found for this or any previous visit (from the past 240 hour(s)).   Medications:   . dexamethasone  4 mg Intravenous 4 times per day  . feeding supplement (ENSURE ENLIVE)  237 mL Oral TID BM  . fentaNYL  75 mcg Transdermal Q72H  . morphine  60 mg Oral Q12H  . polyethylene glycol  17 g Oral BID  . QUEtiapine  100 mg Oral Daily  . senna-docusate  1 tablet Oral BID   Continuous Infusions:   Time spent: 25 minutes.   LOS: 16 days   [facility] Pager [phone].   *Please refer to [email or web address], password [email or web address] to get updated schedule on who will round on this patient, as hospitalists switch teams weekly. If 7PM-7AM, please contact night-coverage at [email or web address], password [email or web address] for any overnight needs.  04/17/2015, 11:16 AM

## 2015-04-17 NOTE — Progress Notes (Signed)
[facility] Work  Clinical Social Work was referred by [employer] CSW, Drake Leach to attend multidisciplinary team meeting on 04/16/15.  [facility] Clinical Social Worker has had much experience with this pt in the outpt setting and shared perspective on past history with pt. This CSW was also contacted by pt's aunt, Lucious Groves (one of pt's hcpoa's) after said meeting as she lives in [place], [place]. Family would like to plan future conference with more advance notice in order to arrange transportation. This was a barrier to their attending said meeting.   Per HCPOA/ADRs under media tab, pt has named his mother and aunt as HCPOAs. It appears, pt needs assistance in making medical decisions currently. Mrs. Freda Munro plans to come to [place] this weekend and would like to assist in pt's care planning if needed.   Clinical Social Work interventions: Pt advocacy  Loren Racer, [address] Worker [place]  [place] Phone: [phone] Fax: [phone]

## 2015-04-18 MED ORDER — HYDROMORPHONE HCL 1 MG/ML IJ SOLN
1.0000 mg | INTRAMUSCULAR | Status: DC | PRN
  Administered 2015-04-18 – 2015-04-19 (×6): 1 mg via INTRAVENOUS
  Filled 2015-04-18 (×6): qty 1

## 2015-04-18 NOTE — Clinical Social Work Note (Signed)
CSW spoke with pt's RN who stated that MD had started pt on his IV pain medication again and would not be looking to discharge this weekend. She also stated that  MD had stated that  patient's family was making the decision about [name] over the weekend and would not be discharging this weekend.  Pt's RN stated that pt had rough morning and had finally fell asleep. RN stated that pt's mother is POA and the staff and week day CSW have had a hard time getting family to show up for meetings  CSW called and spoke with pt's aunt who stated that pt's POA was her sister and provided her phone number  Seg[place].  CSW called and left message for Trudie Reed and is awaiting a call back.  CSW spoke with [facility] place liason who stated that she was told pt would not be looking for a bed until Monday because family was meeting and making a decision over the weekend.  Dede Query, LCSW [place] Worker - Weekend Coverage cell #: [phone]

## 2015-04-18 NOTE — Progress Notes (Signed)
Progress Note   Frank Ward RVU:[id number] DOB: [birth year] DOA: 04/01/2015 PCP: No PCP Per Patient   Brief Narrative:   Frank Ward is an 44 y.o. 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 Patient underwent psychiatric evaluation 04/07/15 secondary to refusal of radiation treatments and was diagnosed with adjustment disorder with mixed anxiety and depressed mood. He was felt to have capacity to make his own medical decisions. He is currently undergoing ongoing radiation treatments and the radiation oncologist is considering radioactive iodine therapy however his TSH needs to be greater than 30 before this can be done. Synthroid on hold pending further treatment recommendations by radiation oncologist.  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 - Status post thyroidectomy (01/29/13) as well as ablative radioactive iodine. - Has been noncompliant with treatment planning as an outpatient with failure to follow-up on multiple occasions. - Pain control efforts limited by refusal of oral pain medications. He was persistent that we start him on dilaudid or he is going to leave the hospital AMA. Had to restart him on low dose dilaudid. - Spoke with Dr. Sondra Come 04/09/15 regarding possibility of treatment with additional  radioactive iodine.  - TSH elevated. Needs to be greater than 30 for radioactive iodine treatment. Hold Synthroid. Repeat TSH 04/13/15 18.9. - Dr. Sondra Come continues to follow him and is considering additional radiation  treatments to his left shoulder area.  We had a multidisciplinary meeting on 9/1 with oncology. According to oncology assessment,Frank Ward has incurable thryoid cancer. His prognosis is, in oncology 's opinion, is weeks. He is a good candidate for [facility]. Patient is not acceptable of his prognosis. . It would be really helpful to get psychiatry to determine capacity/competency long-term. At this point we should focus on pain management as patient has ran out of all treatment options, except outpatient radiation therapy to the left shoulder/brain?. This decision  is deferred to radiation oncology.  Strongly recommend [place] placement at this time. Frank Pray, MD does not recommend any radiation treatments at this time. Given poor prognosis for oncology and radiation oncology, I discussed with the patient's mother extensively on the phone at [phone]. She has not made a final decision about [facility] place and would like to wait until Monday after talking to her entire family and let me know on Monday.    Pain management-continue Duragesic patch, morphine sulfate immediate release, MS Contin. He was started on IV dialudid as he reports nothing works for him.     Fall-patient very noncompliant , discontinued sitter, multiple findings on CT scan of the cervical spine and CT of the head but no acute trauma.    Diplopia and tinnitis - Reports a 1 week history of diplopia and a ringing in his right ear (but didn't tell anyone). - MRI brain shows enlarging metastasis with vasogenic edema. IV Decadron started 04/13/15.Defer further management to Dr. Sondra Come . At this time no further treatments are planned    Hypothyroidism - Holding Synthroid as noted above.    Opiod-induced constipation - Continue Senokot, Colace and MiraLAX.    Severe protein calorie  malnutrition with cachexia - Continue Ensure supplements. Dietitian following. - Current weight 130 pounds (59.24 kg), an 8 pound  weight loss from 03/03/15.    Noncompliance - Related to underlying depression/anxiety and loss of control in this young man facing a terminal diagnosis. - Status post psychiatry evaluation where he was found to have capacity. - Mood remains somewhat labile. Patient is incoherent and has no insight into his current medical condition.    Adjustment disorder with mixed anxiety and depressed mood - Refuses to take Seroquel, continue Ativan and trazodone as needed. - Found to have capacity per psychiatrist.I feel that patient clearly lacks the ability to care for himself primarily and has no insight into his clinical situation because of a combination of medications such as narcotics and Decadron, brain metastasis, psychiatric issues.  -    Anemia in neoplastic disease - No current indication for transfusion.    DVT Prophylaxis - Lovenox ordered but has been refusing to take it.  Family Communication: No family present at the bedside.  Disposition Plan: Home versus SNF or residential hospice when stable and pain controlled.  Will be challenging to discharge him to a safe environment given his mental health issues and lack of family support (although he does have a mother and a brother that live locally).    Code Status:     Code Status Orders        Start     Ordered   04/01/15 1930  Full code   Continuous     04/01/15 1929      IV Access:    Peripheral IV   Procedures and diagnostic studies:   Ct Head Wo Contrast  04/15/2015   CLINICAL DATA:  Status post fall out of bed. Hit left temporal region, with laceration. Combative. Metastatic thyroid cancer. Initial encounter.  EXAM: CT HEAD WITHOUT CONTRAST  CT CERVICAL SPINE WITHOUT CONTRAST  TECHNIQUE: Multidetector CT imaging of the head and cervical spine was performed following the standard protocol without intravenous contrast. Multiplanar CT image reconstructions of the cervical spine were also generated.  COMPARISON:  MRI of  the brain performed 04/13/2015, and CT of the head performed 02/06/2014. CT of the cervical spine performed 07/26/2013  FINDINGS: CT HEAD FINDINGS  A 1.6 x 1.4 cm high attenuation mass is again noted at the high left parietal lobe, with diffusely decreased white matter attenuation involving much of the left parietal and frontal lobes. A complex mass is again noted at the fourth ventricle, measuring 3.0 x 1.7 cm. There is minimal rightward midline shift, measuring 3-4 mm.  Prominence of the ventricles and sulci reflects mild cortical volume loss. Periventricular white matter change likely reflects small vessel ischemic microangiopathy.  There is no evidence of fracture; visualized osseous structures are unremarkable in appearance. The orbits are within normal limits. There is opacification of the right frontal sinus. Mild mucosal thickening is noted at the left maxillary sinus. The remaining paranasal sinuses and mastoid air cells are well-aerated. No significant soft tissue abnormalities are seen.  CT CERVICAL SPINE FINDINGS  Since the prior study in 2014, there has been further collapse of the pathologic fractures of C3 and C4, with approximately 4 mm of retropulsion noted at C3, and narrowing of the spinal canal to 6-7 mm in AP dimension on sagittal images. There is corresponding mild grade 1 retrolisthesis of C2 on C3. An apparent chronic lesion is noted at the inferior endplate of T1, grossly unchanged in appearance. Prevertebral soft tissues are  within normal limits.  The thyroid gland is not well seen. The visualized lung apices are clear. No significant soft tissue abnormalities are seen.  IMPRESSION: 1. No evidence of traumatic intracranial injury or fracture. 2. Further collapse of the pathologic fractures of C3 and C4 since 2014, with approximately 4 mm of the retropulsion noted at C3, and narrowing of the spinal canal to 6-7 mm in AP dimension on sagittal images. Corresponding mild grade 1 retrolisthesis  of C2 on C3. 3. Complex mass again noted at the fourth ventricle, measuring 3.0 x 1.7 cm. 4. 1.6 x 1.4 cm high attenuation mass again noted at the high left parietal lobe, with diffusely decreased white matter attenuation involving much of the left parietal and frontal lobes. Minimal associated rightward midline shift, measuring 3-4 mm. 5. Apparent chronic lesion again noted at the inferior endplate of C1, stable from 2014. 6. Mild cortical volume loss and scattered small vessel ischemic microangiopathy. 7. Opacification of the right frontal sinus. Mild mucosal thickening at the left maxillary sinus. These results were called by telephone at the time of interpretation on 04/15/2015 at 4:42 am to [facility] on [place], who verbally acknowledged these results.   Electronically Signed   By: Garald Balding M.D.   On: 04/15/2015 04:42   Ct Chest Wo Contrast  04/04/2015   CLINICAL DATA:  History of metastatic thyroid cancer  EXAM: CT CHEST WITHOUT CONTRAST  TECHNIQUE: Multidetector CT imaging of the chest was performed following the standard protocol without IV contrast.  COMPARISON:  12/24/2014  FINDINGS: The lungs are well aerated bilaterally. The moderate right-sided pleural effusion is again identified stable from the previous exam. No significant left pleural effusion is seen. There are multiple too-numerous-to-count pulmonary mass is identified bilaterally. Largest of these on the left measures approximately 3.4 cm and is stable in size from the prior exam. Conglomeration of nodules is noted centrally. No significant hilar or mediastinal adenopathy is noted.  The bony structures show multiple areas of metastatic disease. An expansile lytic lesion is noted within the scapula on the right laterally just below the glenoid and extending into the inferior aspect of the glenoid. Additionally an expansile lesion is noted within the right anterior chest wall. It measures approximately 5.8 cm in greatest dimension and is  slightly enlarged from the prior exam. Multiple spinal lesions are seen. The areas of worst lytic destruction are noted in the left vertebral body at T10 as well as the left half of the vertebral body at T12 and within the pedicle and lamina and extending laterally from the L1. The changes at L1 show encroachment and destruction of the posterior wall of the spinal canal although no definitive soft tissue component is seen within the canal. The lack of IV contrast somewhat limits evaluation. The remainder the upper abdomen is within normal limits with the exception of metastatic disease of the left adrenal gland. The adrenal nodule measures 2.7 cm increased from 1.9 cm on the prior exam.  IMPRESSION: Changes consistent with the given clinical history of metastatic thyroid cancer to the lungs and bones as well as the left adrenal gland. There is been some slight increase in the degree of bony abnormalities. The lung abnormalities are roughly stable from the prior exam.  Stable right-sided effusion.   Electronically Signed   By: Inez Catalina M.D.   On: 04/04/2015 14:16   Ct Cervical Spine Wo Contrast  04/15/2015   CLINICAL DATA:  Status post fall out of bed. Hit  left temporal region, with laceration. Combative. Metastatic thyroid cancer. Initial encounter.  EXAM: CT HEAD WITHOUT CONTRAST  CT CERVICAL SPINE WITHOUT CONTRAST  TECHNIQUE: Multidetector CT imaging of the head and cervical spine was performed following the standard protocol without intravenous contrast. Multiplanar CT image reconstructions of the cervical spine were also generated.  COMPARISON:  MRI of the brain performed 04/13/2015, and CT of the head performed 02/06/2014. CT of the cervical spine performed 07/26/2013  FINDINGS: CT HEAD FINDINGS  A 1.6 x 1.4 cm high attenuation mass is again noted at the high left parietal lobe, with diffusely decreased white matter attenuation involving much of the left parietal and frontal lobes. A complex mass is  again noted at the fourth ventricle, measuring 3.0 x 1.7 cm. There is minimal rightward midline shift, measuring 3-4 mm.  Prominence of the ventricles and sulci reflects mild cortical volume loss. Periventricular white matter change likely reflects small vessel ischemic microangiopathy.  There is no evidence of fracture; visualized osseous structures are unremarkable in appearance. The orbits are within normal limits. There is opacification of the right frontal sinus. Mild mucosal thickening is noted at the left maxillary sinus. The remaining paranasal sinuses and mastoid air cells are well-aerated. No significant soft tissue abnormalities are seen.  CT CERVICAL SPINE FINDINGS  Since the prior study in 2014, there has been further collapse of the pathologic fractures of C3 and C4, with approximately 4 mm of retropulsion noted at C3, and narrowing of the spinal canal to 6-7 mm in AP dimension on sagittal images. There is corresponding mild grade 1 retrolisthesis of C2 on C3. An apparent chronic lesion is noted at the inferior endplate of T1, grossly unchanged in appearance. Prevertebral soft tissues are within normal limits.  The thyroid gland is not well seen. The visualized lung apices are clear. No significant soft tissue abnormalities are seen.  IMPRESSION: 1. No evidence of traumatic intracranial injury or fracture. 2. Further collapse of the pathologic fractures of C3 and C4 since 2014, with approximately 4 mm of the retropulsion noted at C3, and narrowing of the spinal canal to 6-7 mm in AP dimension on sagittal images. Corresponding mild grade 1 retrolisthesis of C2 on C3. 3. Complex mass again noted at the fourth ventricle, measuring 3.0 x 1.7 cm. 4. 1.6 x 1.4 cm high attenuation mass again noted at the high left parietal lobe, with diffusely decreased white matter attenuation involving much of the left parietal and frontal lobes. Minimal associated rightward midline shift, measuring 3-4 mm. 5. Apparent  chronic lesion again noted at the inferior endplate of C1, stable from 2014. 6. Mild cortical volume loss and scattered small vessel ischemic microangiopathy. 7. Opacification of the right frontal sinus. Mild mucosal thickening at the left maxillary sinus. These results were called by telephone at the time of interpretation on 04/15/2015 at 4:42 am to [facility] on [place], who verbally acknowledged these results.   Electronically Signed   By: Garald Balding M.D.   On: 04/15/2015 04:42   Frank Jeri Cos BT Contrast  04/13/2015   CLINICAL DATA:  Diplopia. Metastatic thyroid cancer. Osseous metastases.  EXAM: MRI HEAD WITHOUT AND WITH CONTRAST  TECHNIQUE: Multiplanar, multiecho pulse sequences of the brain and surrounding structures were obtained without and with intravenous contrast.  CONTRAST:  67mL MULTIHANCE GADOBENATE DIMEGLUMINE 529 MG/ML IV SOLN  COMPARISON:  MRI brain without and with contrast 05/09/2014.  FINDINGS: The a heterogeneous enhancing lesion centered in the fourth ventricle demonstrates marked interval enlargement  acute and chronic blood products are evident within this lesion. There is significant increase in surrounding vasogenic edema. T2 changes extending to the cerebellar peduncle is bilaterally as well as the inferior colliculus on the right.  Although the lesion has enlarged and demonstrates intrinsic blood products, there is no obstructing hydrocephalus. Mild ventricular enlargement is stable, compatible with the degree of atrophy.  A 7 mm enhancing lesion along the right choroid plexus any punctate lesion within the right central sulcus are stable.  A new enhancing lesion within the anterior left parietal lobe measures 1.4 x 1.9 x 1.6 cm. There is significant surrounding edema and mass effect with partial effacement of the left lateral ventricle. Midline shift measures 5 mm at the foramen of [place].  No other enhancing parenchymal lesions are evident. Pathologic fracture of the C3 vertebral  body is again noted with posterior extension of tumor and bone resulting and significant central canal stenosis at the C3 level. Chronic collapse of the C4 vertebral body is noted as well.  No acute infarct is present. Flow is present in the major intracranial arteries. Chronic opacification of the right frontal sinus is noted. A polyp or mucous retention cyst is again noted within the left maxillary sinus. The remaining paranasal sinuses are clear. Increased fluid is present in the left mastoid air cells. No obstructing nasopharyngeal lesion is present.  IMPRESSION: 1. Increased size of heterogeneous enhancing mass lesion within the fourth ventricle, now measuring 3.0 x 2.2 x 2.2 cm. The lesion demonstrates acute and chronic intrinsic blood products. 2. New enhancing metastatic lesion in the anterior left parietal lobe with significant surrounding vasogenic edema and mass effect. Midline shift is 5 mm. Intrinsic blood products are noted. 3. Stable focal enhancing lesions along the right choroid plexus and within the right central sulcus. 4. Moderate atrophy and diffuse white matter disease is otherwise stable. 5. Chronic right frontal sinus disease. 6. Left mastoid effusion. No obstructing nasopharyngeal lesion is evident.   Electronically Signed   By: San Morelle M.D.   On: 04/13/2015 12:34   Dg Chest Port 1 View  04/01/2015   CLINICAL DATA:  Left clavicle and shoulder pain. Thyroid cancer with metastatic bone disease.  EXAM: PORTABLE CHEST - 1 VIEW  COMPARISON:  CT chest 12/24/2014.  FINDINGS: The heart size is normal. Multiple bilateral pulmonary nodules are present bilaterally compatible with known metastatic disease. There has been some progression since the prior study. No focal airspace consolidation is evident otherwise.  Rightward curvature is present in the lower thoracic spine due to collapse pathologic left-sided fracture, unchanged. An erosive lesion is again noted in the right scapula. No  definite new lesions are evident.  IMPRESSION: 1. Progression of numerous pulmonary nodules compatible with known metastatic disease. 2. Rightward curvature of the thoracic spine is secondary to asymmetric left-sided pathologic fractures, unchanged. 3. Stable erosive lesion in the right scapula.   Electronically Signed   By: San Morelle M.D.   On: 04/01/2015 16:19     Medical Consultants:    Radiation Oncology: Frank Pray, MD  Psychiatry: Ambrose Finland, MD  Oncology: Chauncey Cruel, MD  Anti-Infectives:    None.  Subjective:   Frank Ward patient states that his pain is worst today.   Objective:    Filed Vitals:   04/17/15 0500 04/17/15 1630 04/17/15 2133 04/18/15 0450  BP: 96/55 99/58 93/57  104/59  Pulse: 71 84 78 80  Temp: 98.4 F (36.9 C) 98.4 F (36.9 C) 98.6 F (  37 C) 98.6 F (37 C)  TempSrc: Oral Oral Oral Oral  Resp: 16 16 16 16   Height:      Weight:      SpO2: 100% 98% 98% 100%    Intake/Output Summary (Last 24 hours) at 04/18/15 1625 Last data filed at 04/18/15 1300  Gross per 24 hour  Intake   1220 ml  Output    800 ml  Net    420 ml    Exam: Gen: NAD Psych: agitated.  Cardiovascular:  RRR, No M/R/G Respiratory:  Lungs diminished Gastrointestinal:  Abdomen soft, NT/ND, + BS Extremities:  No C/E/C   Data Reviewed:    Labs: Basic Metabolic Panel:  Recent Labs Lab 04/15/15 1110  NA 140  K 4.2  CL 105  CO2 27  GLUCOSE 141*  BUN 14  CREATININE 0.73  CALCIUM 9.1  MG 1.9   GFR Estimated Creatinine Clearance: 96.6 mL/min (by C-G formula based on Cr of 0.73).  CBC:  Recent Labs Lab 04/16/15 0432  WBC 9.4  HGB 8.6*  HCT 26.6*  MCV 89.0  PLT 512*   CBG:  Recent Labs Lab 04/13/15 1023  GLUCAP 77   Microbiology No results found for this or any previous visit (from the past 240 hour(s)).   Medications:   . dexamethasone  4 mg Intravenous 4 times per day  . feeding supplement (ENSURE  ENLIVE)  237 mL Oral TID BM  . fentaNYL  75 mcg Transdermal Q72H  . morphine  60 mg Oral Q12H  . polyethylene glycol  17 g Oral BID  . QUEtiapine  100 mg Oral Daily  . senna-docusate  1 tablet Oral BID   Continuous Infusions:   Time spent: 25 minutes.   LOS: 17 days   [facility] Pager [phone]  *Please refer to [email or web address], password [email or web address] to get updated schedule on who will round on this patient, as hospitalists switch teams weekly. If 7PM-7AM, please contact night-coverage at [email or web address], password [email or web address] for any overnight needs.  04/18/2015, 4:25 PM

## 2015-04-19 MED ORDER — HYDROMORPHONE HCL 1 MG/ML IJ SOLN
1.0000 mg | INTRAMUSCULAR | Status: DC | PRN
  Administered 2015-04-19 (×2): 2 mg via INTRAVENOUS
  Administered 2015-04-20: 1 mg via INTRAVENOUS
  Administered 2015-04-20: 2 mg via INTRAVENOUS
  Administered 2015-04-20 (×2): 1 mg via INTRAVENOUS
  Administered 2015-04-20 – 2015-04-21 (×3): 2 mg via INTRAVENOUS
  Filled 2015-04-19: qty 2
  Filled 2015-04-19 (×2): qty 1
  Filled 2015-04-19 (×6): qty 2

## 2015-04-19 NOTE — Progress Notes (Signed)
PT Cancellation Note  Patient Details Name: Frank Ward MRN: [id number] DOB: [birth year]   Cancelled Treatment:    Reason Eval/Treat Not Completed: Other (comment) (nursing reports that  patient requires assist to [place] when  he will go, nsg requesting to  not bother patient at this time as he is quiet and resting.)   [name] 04/19/2015, 3:24 PM Tresa Endo PT [phone]

## 2015-04-19 NOTE — Progress Notes (Signed)
Progress Note   Frank Ward:[id number] DOB: 04-19-1971 DOA: 04/01/2015 PCP: No PCP Per Patient   Brief Narrative:   Frank Ward is an 44 y.o. 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 Patient underwent psychiatric evaluation 04/07/15 secondary to refusal of radiation treatments and was diagnosed with adjustment disorder with mixed anxiety and depressed mood. He was felt to have capacity to make his own medical decisions. He is currently undergoing ongoing radiation treatments and the radiation oncologist is considering radioactive iodine therapy however his TSH needs to be greater than 30 before this can be done. Synthroid on hold pending further treatment recommendations by radiation oncologist.  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 - Status post thyroidectomy (01/29/13) as well as ablative radioactive iodine. - Has been noncompliant with treatment planning as an outpatient with failure to follow-up on multiple occasions. - Pain control efforts limited by refusal of oral pain medications. He was persistent that we start him on dilaudid or he is going to leave the hospital AMA on 9/3. Started on dilaudid 1 to 2 mg every 4 hours.    - Spoke with Dr. Sondra Come 04/09/15 regarding possibility of treatment with additional  radioactive iodine.  - TSH elevated. Needs to be greater than 30 for radioactive iodine treatment. Hold Synthroid. Repeat TSH 04/13/15 18.9. - Dr. Sondra Come continues to follow him and is considering additional  radiation treatments to his left shoulder area.  We had a multidisciplinary meeting on 9/1 with oncology. According to oncology assessment,[name] has incurable thryoid cancer. His prognosis is, in oncology 's opinion, is weeks. He is a good candidate for [facility]. Patient is not acceptable of his prognosis. . It would be really helpful to get psychiatry to determine capacity/competency long-term. At this point we should focus on pain management as patient has ran out of all treatment options, except outpatient radiation therapy to the left shoulder/brain?. This decision  is deferred to radiation oncology.  Strongly recommend [facility] placement at this time. [name], MD does not recommend any radiation treatments at this time. Given poor prognosis for oncology and radiation oncology, I discussed with the patient's mother extensively on the phone at [phone]. She has not made a final decision about [facility] and would like to wait until Monday after talking to her entire family and let me know on Monday.    Pain management-continue Duragesic patch, morphine sulfate immediate release, MS Contin. He was started on IV dialudid as he reports nothing works for him.     Fall-patient very noncompliant , discontinued sitter, multiple findings on CT scan of the cervical spine and CT of the head but no acute trauma.    Diplopia and tinnitis - Reports a 1 week history of diplopia and a ringing in his right ear (but didn't tell anyone). - MRI brain shows enlarging metastasis with vasogenic edema. IV Decadron started 04/13/15.Defer further management to Dr. Sondra Come . At this time no further treatments are planned    Hypothyroidism - Holding Synthroid as noted above.    Opiod-induced constipation - Continue Senokot, Colace and  MiraLAX.    Severe protein calorie malnutrition with cachexia - Continue Ensure supplements. Dietitian following. - Current weight 130 pounds (59.24 kg),  an 8 pound weight loss from 03/03/15.    Noncompliance - Related to underlying depression/anxiety and loss of control in this young man facing a terminal diagnosis. - Status post psychiatry evaluation where he was found to have capacity. - Mood remains somewhat labile. Patient is incoherent and has no insight into his current medical condition.    Adjustment disorder with mixed anxiety and depressed mood - Refuses to take Seroquel, continue Ativan and trazodone as needed. - Found to have capacity per psychiatrist.I feel that patient clearly lacks the ability to care for himself primarily and has no insight into his clinical situation because of a combination of medications such as narcotics and Decadron, brain metastasis, psychiatric issues.  -    Anemia in neoplastic disease - No current indication for transfusion.    DVT Prophylaxis - Lovenox ordered but has been refusing to take it.  Family Communication: No family present at the bedside.  Disposition Plan: Home versus SNF or residential hospice when stable and pain controlled.  Will be challenging to discharge him to a safe environment given his mental health issues and lack of family support (although he does have a mother and a brother that live locally).    Code Status:     Code Status Orders        Start     Ordered   04/01/15 1930  Full code   Continuous     04/01/15 1929      IV Access:    Peripheral IV   Procedures and diagnostic studies:   Ct Head Wo Contrast  04/15/2015   CLINICAL DATA:  Status post fall out of bed. Hit left temporal region, with laceration. Combative. Metastatic thyroid cancer. Initial encounter.  EXAM: CT HEAD WITHOUT CONTRAST  CT CERVICAL SPINE WITHOUT CONTRAST  TECHNIQUE: Multidetector CT imaging of the head and cervical spine was performed following the standard protocol without intravenous contrast. Multiplanar CT image reconstructions of the cervical spine were also generated.   COMPARISON:  MRI of the brain performed 04/13/2015, and CT of the head performed 02/06/2014. CT of the cervical spine performed 07/26/2013  FINDINGS: CT HEAD FINDINGS  A 1.6 x 1.4 cm high attenuation mass is again noted at the high left parietal lobe, with diffusely decreased white matter attenuation involving much of the left parietal and frontal lobes. A complex mass is again noted at the fourth ventricle, measuring 3.0 x 1.7 cm. There is minimal rightward midline shift, measuring 3-4 mm.  Prominence of the ventricles and sulci reflects mild cortical volume loss. Periventricular white matter change likely reflects small vessel ischemic microangiopathy.  There is no evidence of fracture; visualized osseous structures are unremarkable in appearance. The orbits are within normal limits. There is opacification of the right frontal sinus. Mild mucosal thickening is noted at the left maxillary sinus. The remaining paranasal sinuses and mastoid air cells are well-aerated. No significant soft tissue abnormalities are seen.  CT CERVICAL SPINE FINDINGS  Since the prior study in 2014, there has been further collapse of the pathologic fractures of C3 and C4, with approximately 4 mm of retropulsion noted at C3, and narrowing of the spinal canal to 6-7 mm in AP dimension on sagittal images. There is corresponding mild grade 1 retrolisthesis of C2 on C3. An apparent chronic lesion is noted at the inferior endplate of T1, grossly  unchanged in appearance. Prevertebral soft tissues are within normal limits.  The thyroid gland is not well seen. The visualized lung apices are clear. No significant soft tissue abnormalities are seen.  IMPRESSION: 1. No evidence of traumatic intracranial injury or fracture. 2. Further collapse of the pathologic fractures of C3 and C4 since 2014, with approximately 4 mm of the retropulsion noted at C3, and narrowing of the spinal canal to 6-7 mm in AP dimension on sagittal images. Corresponding mild  grade 1 retrolisthesis of C2 on C3. 3. Complex mass again noted at the fourth ventricle, measuring 3.0 x 1.7 cm. 4. 1.6 x 1.4 cm high attenuation mass again noted at the high left parietal lobe, with diffusely decreased white matter attenuation involving much of the left parietal and frontal lobes. Minimal associated rightward midline shift, measuring 3-4 mm. 5. Apparent chronic lesion again noted at the inferior endplate of C1, stable from 2014. 6. Mild cortical volume loss and scattered small vessel ischemic microangiopathy. 7. Opacification of the right frontal sinus. Mild mucosal thickening at the left maxillary sinus. These results were called by telephone at the time of interpretation on 04/15/2015 at 4:42 am to [facility] on [place], who verbally acknowledged these results.   Electronically Signed   By: Garald Balding M.D.   On: 04/15/2015 04:42   Ct Chest Wo Contrast  04/04/2015   CLINICAL DATA:  History of metastatic thyroid cancer  EXAM: CT CHEST WITHOUT CONTRAST  TECHNIQUE: Multidetector CT imaging of the chest was performed following the standard protocol without IV contrast.  COMPARISON:  12/24/2014  FINDINGS: The lungs are well aerated bilaterally. The moderate right-sided pleural effusion is again identified stable from the previous exam. No significant left pleural effusion is seen. There are multiple too-numerous-to-count pulmonary mass is identified bilaterally. Largest of these on the left measures approximately 3.4 cm and is stable in size from the prior exam. Conglomeration of nodules is noted centrally. No significant hilar or mediastinal adenopathy is noted.  The bony structures show multiple areas of metastatic disease. An expansile lytic lesion is noted within the scapula on the right laterally just below the glenoid and extending into the inferior aspect of the glenoid. Additionally an expansile lesion is noted within the right anterior chest wall. It measures approximately 5.8 cm in  greatest dimension and is slightly enlarged from the prior exam. Multiple spinal lesions are seen. The areas of worst lytic destruction are noted in the left vertebral body at T10 as well as the left half of the vertebral body at T12 and within the pedicle and lamina and extending laterally from the L1. The changes at L1 show encroachment and destruction of the posterior wall of the spinal canal although no definitive soft tissue component is seen within the canal. The lack of IV contrast somewhat limits evaluation. The remainder the upper abdomen is within normal limits with the exception of metastatic disease of the left adrenal gland. The adrenal nodule measures 2.7 cm increased from 1.9 cm on the prior exam.  IMPRESSION: Changes consistent with the given clinical history of metastatic thyroid cancer to the lungs and bones as well as the left adrenal gland. There is been some slight increase in the degree of bony abnormalities. The lung abnormalities are roughly stable from the prior exam.  Stable right-sided effusion.   Electronically Signed   By: Inez Catalina M.D.   On: 04/04/2015 14:16   Ct Cervical Spine Wo Contrast  04/15/2015   CLINICAL DATA:  Status post fall out of bed. Hit left temporal region, with laceration. Combative. Metastatic thyroid cancer. Initial encounter.  EXAM: CT HEAD WITHOUT CONTRAST  CT CERVICAL SPINE WITHOUT CONTRAST  TECHNIQUE: Multidetector CT imaging of the head and cervical spine was performed following the standard protocol without intravenous contrast. Multiplanar CT image reconstructions of the cervical spine were also generated.  COMPARISON:  MRI of the brain performed 04/13/2015, and CT of the head performed 02/06/2014. CT of the cervical spine performed 07/26/2013  FINDINGS: CT HEAD FINDINGS  A 1.6 x 1.4 cm high attenuation mass is again noted at the high left parietal lobe, with diffusely decreased white matter attenuation involving much of the left parietal and frontal  lobes. A complex mass is again noted at the fourth ventricle, measuring 3.0 x 1.7 cm. There is minimal rightward midline shift, measuring 3-4 mm.  Prominence of the ventricles and sulci reflects mild cortical volume loss. Periventricular white matter change likely reflects small vessel ischemic microangiopathy.  There is no evidence of fracture; visualized osseous structures are unremarkable in appearance. The orbits are within normal limits. There is opacification of the right frontal sinus. Mild mucosal thickening is noted at the left maxillary sinus. The remaining paranasal sinuses and mastoid air cells are well-aerated. No significant soft tissue abnormalities are seen.  CT CERVICAL SPINE FINDINGS  Since the prior study in 2014, there has been further collapse of the pathologic fractures of C3 and C4, with approximately 4 mm of retropulsion noted at C3, and narrowing of the spinal canal to 6-7 mm in AP dimension on sagittal images. There is corresponding mild grade 1 retrolisthesis of C2 on C3. An apparent chronic lesion is noted at the inferior endplate of T1, grossly unchanged in appearance. Prevertebral soft tissues are within normal limits.  The thyroid gland is not well seen. The visualized lung apices are clear. No significant soft tissue abnormalities are seen.  IMPRESSION: 1. No evidence of traumatic intracranial injury or fracture. 2. Further collapse of the pathologic fractures of C3 and C4 since 2014, with approximately 4 mm of the retropulsion noted at C3, and narrowing of the spinal canal to 6-7 mm in AP dimension on sagittal images. Corresponding mild grade 1 retrolisthesis of C2 on C3. 3. Complex mass again noted at the fourth ventricle, measuring 3.0 x 1.7 cm. 4. 1.6 x 1.4 cm high attenuation mass again noted at the high left parietal lobe, with diffusely decreased white matter attenuation involving much of the left parietal and frontal lobes. Minimal associated rightward midline shift, measuring  3-4 mm. 5. Apparent chronic lesion again noted at the inferior endplate of C1, stable from 2014. 6. Mild cortical volume loss and scattered small vessel ischemic microangiopathy. 7. Opacification of the right frontal sinus. Mild mucosal thickening at the left maxillary sinus. These results were called by telephone at the time of interpretation on 04/15/2015 at 4:42 am to [facility] on [place], who verbally acknowledged these results.   Electronically Signed   By: Garald Balding M.D.   On: 04/15/2015 04:42   Mr Jeri Cos XB Contrast  04/13/2015   CLINICAL DATA:  Diplopia. Metastatic thyroid cancer. Osseous metastases.  EXAM: MRI HEAD WITHOUT AND WITH CONTRAST  TECHNIQUE: Multiplanar, multiecho pulse sequences of the brain and surrounding structures were obtained without and with intravenous contrast.  CONTRAST:  11mL MULTIHANCE GADOBENATE DIMEGLUMINE 529 MG/ML IV SOLN  COMPARISON:  MRI brain without and with contrast 05/09/2014.  FINDINGS: The a heterogeneous enhancing lesion centered in  the fourth ventricle demonstrates marked interval enlargement acute and chronic blood products are evident within this lesion. There is significant increase in surrounding vasogenic edema. T2 changes extending to the cerebellar peduncle is bilaterally as well as the inferior colliculus on the right.  Although the lesion has enlarged and demonstrates intrinsic blood products, there is no obstructing hydrocephalus. Mild ventricular enlargement is stable, compatible with the degree of atrophy.  A 7 mm enhancing lesion along the right choroid plexus any punctate lesion within the right central sulcus are stable.  A new enhancing lesion within the anterior left parietal lobe measures 1.4 x 1.9 x 1.6 cm. There is significant surrounding edema and mass effect with partial effacement of the left lateral ventricle. Midline shift measures 5 mm at the foramen of [place]ro.  No other enhancing parenchymal lesions are evident. Pathologic fracture of  the C3 vertebral body is again noted with posterior extension of tumor and bone resulting and significant central canal stenosis at the C3 level. Chronic collapse of the C4 vertebral body is noted as well.  No acute infarct is present. Flow is present in the major intracranial arteries. Chronic opacification of the right frontal sinus is noted. A polyp or mucous retention cyst is again noted within the left maxillary sinus. The remaining paranasal sinuses are clear. Increased fluid is present in the left mastoid air cells. No obstructing nasopharyngeal lesion is present.  IMPRESSION: 1. Increased size of heterogeneous enhancing mass lesion within the fourth ventricle, now measuring 3.0 x 2.2 x 2.2 cm. The lesion demonstrates acute and chronic intrinsic blood products. 2. New enhancing metastatic lesion in the anterior left parietal lobe with significant surrounding vasogenic edema and mass effect. Midline shift is 5 mm. Intrinsic blood products are noted. 3. Stable focal enhancing lesions along the right choroid plexus and within the right central sulcus. 4. Moderate atrophy and diffuse white matter disease is otherwise stable. 5. Chronic right frontal sinus disease. 6. Left mastoid effusion. No obstructing nasopharyngeal lesion is evident.   Electronically Signed   By: San Morelle M.D.   On: 04/13/2015 12:34   Dg Chest Port 1 View  04/01/2015   CLINICAL DATA:  Left clavicle and shoulder pain. Thyroid cancer with metastatic bone disease.  EXAM: PORTABLE CHEST - 1 VIEW  COMPARISON:  CT chest 12/24/2014.  FINDINGS: The heart size is normal. Multiple bilateral pulmonary nodules are present bilaterally compatible with known metastatic disease. There has been some progression since the prior study. No focal airspace consolidation is evident otherwise.  Rightward curvature is present in the lower thoracic spine due to collapse pathologic left-sided fracture, unchanged. An erosive lesion is again noted in the  right scapula. No definite new lesions are evident.  IMPRESSION: 1. Progression of numerous pulmonary nodules compatible with known metastatic disease. 2. Rightward curvature of the thoracic spine is secondary to asymmetric left-sided pathologic fractures, unchanged. 3. Stable erosive lesion in the right scapula.   Electronically Signed   By: San Morelle M.D.   On: 04/01/2015 16:19     Medical Consultants:    Radiation Oncology: [name], MD  Psychiatry: Ambrose Finland, MD  Oncology: Chauncey Cruel, MD  Anti-Infectives:    None.  Subjective:   [place] patient states that his pain is worst today.   Objective:    Filed Vitals:   04/17/15 1630 04/17/15 2133 04/18/15 0450 04/18/15 2238  BP: 99/58 93/57 104/59 104/66  Pulse: 84 78 80 71  Temp:   98.6  F (37 C) 97.9 F (36.6 C)  TempSrc: Oral Oral Oral Oral  Resp: 16 16 16 16   Height:      Weight:      SpO2: 98% 98% 100% 100%    Intake/Output Summary (Last 24 hours) at 04/19/15 1351 Last data filed at 04/19/15 0900  Gross per 24 hour  Intake    360 ml  Output    750 ml  Net   -390 ml    Exam: Gen: NAD Psych: agitated.  Cardiovascular:  RRR, No M/R/G Respiratory:  Lungs diminished Gastrointestinal:  Abdomen soft, NT/ND, + BS Extremities:  No C/E/C   Data Reviewed:    Labs: Basic Metabolic Panel:  Recent Labs Lab 04/15/15 1110  NA 140  K 4.2  CL 105  CO2 27  GLUCOSE 141*  BUN 14  CREATININE 0.73  CALCIUM 9.1  MG 1.9   GFR Estimated Creatinine Clearance: 96.6 mL/min (by C-G formula based on Cr of 0.73).  CBC:  Recent Labs Lab 04/16/15 0432  WBC 9.4  HGB 8.6*  HCT 26.6*  MCV 89.0  PLT 512*   CBG:  Recent Labs Lab 04/13/15 1023  GLUCAP 77   Microbiology No results found for this or any previous visit (from the past 240 hour(s)).   Medications:   . dexamethasone  4 mg Intravenous 4 times per day  . feeding supplement (ENSURE ENLIVE)  237  mL Oral TID BM  . fentaNYL  75 mcg Transdermal Q72H  . morphine  60 mg Oral Q12H  . polyethylene glycol  17 g Oral BID  . QUEtiapine  100 mg Oral Daily  . senna-docusate  1 tablet Oral BID   Continuous Infusions:   Time spent: 25 minutes.   LOS: 18 days   [facility] Pager [phone]  *Please refer to [email or web address], password [email or web address] to get updated schedule on who will round on this patient, as hospitalists switch teams weekly. If 7PM-7AM, please contact night-coverage at [email or web address], password [email or web address] for any overnight needs.  04/19/2015, 1:51 PM

## 2015-04-20 MED ORDER — HALOPERIDOL LACTATE 5 MG/ML IJ SOLN
5.0000 mg | Freq: Four times a day (QID) | INTRAMUSCULAR | Status: DC | PRN
  Administered 2015-04-20 – 2015-04-23 (×4): 5 mg via INTRAVENOUS
  Filled 2015-04-20 (×4): qty 1

## 2015-04-20 MED ORDER — HYDROMORPHONE HCL 2 MG/ML IJ SOLN
2.0000 mg | Freq: Once | INTRAMUSCULAR | Status: AC
  Administered 2015-04-20: 2 mg via INTRAVENOUS
  Filled 2015-04-20: qty 1

## 2015-04-20 NOTE — Progress Notes (Addendum)
CSW continuing to follow.   CSW attempted to follow up with pt mother, [name] via telephone. CSW unable to reach pt mother at this time and voice message left.  CSW contacted pt brother, [name] via telephone. CSW introduced self and explained role. CSW discussed that CSW attempted to follow up with pt mother regarding decision for disposition, but unable to reach. Pt brother discussed that pt mother is very emotional about pt situation and did not go to sleep last night until 3 am and likely still sleeping. Pt brother stated that he recognizes that pt wants to return home, but pt family is not in a position to be able to care for pt at home at this time. Pt brother stated that he was aware that pt needed 24 hour care and that [facility] CSW, Loren Racer had made referral to [facility]), but this process could take weeks in order for someone to be able to provide care in the home. CSW discussed with pt brother that CSW needed to speak with pt mother today regarding [employer]. [facility] has pt referral.   CSW staffed case with [facility] department director, Davis who recommended speak with MD about re-evaluation for capacity from psych and ensure [facility] is processing pt referral.   CSW spoke with attending MD in regard to re-consulting psych for capacity. Attending MD in agreement and will call psych consult for capacity.  CSW confirmed with [facility] liaison, Erling Conte that [employer] [facility] has referral.   CSW to continue to follow to provide support and assist with pt disposition needs.   Addendum 11:56 am:  CSW received return phone call from pt mother, Trudie Reed. Pt mother discussed that she does want referral with [facility] to be processed. Pt mother explained that pt sister is coming into town today and should arrive by 2 pm and pt family plans to come to hospital. Pt mother expressed that she want to make sure that pt know "he is loved".   CSW visited pt room. Pt very  agitated and upset because he states that he needs to pay his rent. CSW notified pt that pt family anticipates to come to the hospital this afternoon. Pt continues to be agitated about his rent. CSW offered to contact apartment complex on behalf of pt and pt agreed. Pt states that person to speak with is Secondary school teacher, Baker Janus.  CSW contacted [employer] and left message for [place]. CSW re-entered pt room and notified pt that [facility] contacted pt apartment complex and left voice message, but today is a holiday, so it is likely that a phone call with not be returned until tomorrow. Pt became increasingly irate. CSW called apartment complex in pt presence per his request, but pt became angry when asked if he would also like to leave a message for Secondary school teacher. CSW exited room at this time as pt unable to understand that today is a holiday therefore CSW will likely not be able to speak with someone at pt apartment complex today.   Pt mother stated that she would notify CSW when pt family arrived to the hospital.  CSW to continue to follow.    Alison Murray, MSW, [facility] [phone]

## 2015-04-20 NOTE — Consult Note (Signed)
[facility] Consult   Reason for Consult:  Capacity evaluation, non compliant with radiation therapy of thyroid cancer with metastasis Referring Physician:  Dr. 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 Diplopia [H53.2] 04/12/2015  . Tinnitus of right ear [H93.11] 04/12/2015  . Constipation [K59.00] 04/09/2015  . 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 Adjustment disorder with mixed anxiety and depressed mood [F43.23] 04/07/2015  . Intractable pain [R52] 04/01/2015  . Cancer [C80.1]   . Nausea with vomiting [R11.2] 01/28/2015  . Weight loss [R63.4] 01/28/2015  . Pathological fracture in neoplastic disease [M84.50XA, D48.9] 02/17/2014  . Noncompliance [Z91.19] 02/16/2014  . Palliative care by specialist [Z78.9] 02/14/2014  . Anxiety state, unspecified [F41.1] 02/14/2014  . Steroid-induced hyperglycemia [R73.9] 02/13/2014  . Foot ulcer [L97.509] 02/13/2014  . Ulcer of left lower leg [L97.929] 02/07/2014  . 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 Bilateral lower extremity edema [R60.0] 01/07/2014  . Numbness and tingling of feet [R20.2] 01/07/2014  . Weakness generalized [R53.1] 12/31/2013  . Cancer associated pain [G89.3] 12/31/2013  . Palliative care encounter [Z51.5] 12/31/2013  . 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 Metastatic cancer to brain [C79.31] 08/30/2013  . 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 Cervical spine fracture [S12.9XXA] 08/28/2013  . Hypertension [I10]   . Thyroid disease [E07.9]   . HAMMER TOE, OTHER, ACQUIRED [M20.40] 06/04/2007    Total Time spent with patient: 45 minutes  Subjective:   Frank Ward is a 44 y.o. male   HPI:  Frank Ward is a 44 years old single African-American male seen for face-to-face psychiatric consultation and evaluation of capacity to make his own medical decisions and living arrangements. Patient was deemed competent due to my previous capacity evaluations. This evaluation was requested secondary to deterioration of his both mental and physical condition since last evaluation. At this current time patient has significant cognitive decline, confused, hearing impairment, generalized weakness, fatigue, poor eye contact, need assistance to step out of the bed, unable to walk steadily and significant fall risk at this time. Reportedly patient has a fall a few days ago while taken to the treatment out of the unit which patient is upset and obsessive about it. Patient knows that he has cancer and also knows that he has less than 2 months to live for as per physician reported to him. Patient also knows that he needs pain medication continuously for intractable pain. Patient was not not able to understand with the repeated and recurrent psychosocial education that he cannot care for himself he need 24-hour supervision and comfort care at this time. Patient has no family members willing to provide 24-hour supervision to him. Patient also easily becomes agitated and aggressive when he perceives that staff is mistreating him, disrespecting him and poisoning him.  Patient has limited understanding about extensive medical condition, current clinical situation, malignant neoplasm of thyroid and metastasis and recommended comfort therapy at this time. Patient is continued to be obsessed about going home to his apartment, living alone and paranoid about the hospital. Patient was not able to understand that he cannot live alone any longer due to his deterioration of physical health. Patient stated that he wanted you normal person life where he can pay his  bills and go to church which is almost impossible at his current  clinical condition. Patient denies symptoms of mania, auditory/visual hallucinations. Patient was previously given Seroquel for combative behaviors. Patient has no outpatient psychiatric or inpatient psychiatric services. Patient denies suicidal/homicidal ideation, intention or plans.   Past Medical History:  Past Medical History  Diagnosis Date  . Cancer     Thyroid  . Thyroid disease   . Hypertension   . Pneumonia   . Hx of radiation therapy 02/07/14- 02/21/14    T12-L2, sacrum 3000 cGy 10 sessions  . Hx of radiation therapy 09/04/13-09/24/13    whole brain, C7-T3, upper C spine, right scapulae  . Hx of radiation therapy 06/10/14    left ischium 1 fx 800 cGy  . Allergy   . Bone cancer     sacrum,T12-L2,c-spine,lt ischium    Past Surgical History  Procedure Laterality Date  . Tendon repair    . Thyroidectomy     Family History:  Family History  Problem Relation Age of Onset  . Diabetes Mother   . Diabetes Brother    Social History:  History  Alcohol Use  . 3.0 oz/week  . 5 Cans of beer per week    Comment: occ     History  Drug Use  . Yes  . Special: Marijuana    Social History   Social History  . Marital Status: Single    Spouse Name: N/A  . Number of Children: N/A  . Years of Education: N/A   Social History Main Topics  . Smoking status: Former Smoker -- 10 years    Types: Cigarettes    Quit date: 08/29/2011  . Smokeless tobacco: Never Used  . Alcohol Use: 3.0 oz/week    5 Cans of beer per week     Comment: occ  . Drug Use: Yes    Special: Marijuana  . Sexual Activity: No   Other Topics Concern  . None   Social History Narrative   Lives with his mother in an apartment. He emulates with a cane. He has no home health services and is not followed by hospice. He has met with palliative care during previous admissions.   Additional Social History:                          Allergies:   Allergies  Allergen Reactions  . Penicillins  Anaphylaxis, Hives, Itching and Other (See Comments)    Pt reports he does not believe this is true, states he does not recall ever being allergic to penicillin   . Latex Hives and Itching  . Lyrica [Pregabalin] Swelling    Labs: No results found for this or any previous visit (from the past 48 hour(s)).  Vitals: Blood pressure 97/40, pulse 72, temperature 97.7 F (36.5 C), temperature source Oral, resp. rate 16, height 6' 4.5" (1.943 m), weight 59.24 kg (130 lb 9.6 oz), SpO2 99 %.  Risk to Self: Is patient at risk for suicide?: No Risk to Others:   Prior Inpatient Therapy:   Prior Outpatient Therapy:    Current Facility-Administered Medications  Medication Dose Route Frequency Provider Last Rate Last Dose  . bisacodyl (DULCOLAX) suppository 10 mg  10 mg Rectal Daily PRN Venetia Maxon Rama, MD      . calcium carbonate (TUMS - dosed in mg elemental calcium) chewable tablet 200 mg of elemental calcium  1 tablet Oral Q6H PRN Ritta Slot, NP  200 mg of elemental calcium at 04/20/15 0251  . chlorpheniramine-HYDROcodone (TUSSIONEX) 10-8 MG/5ML suspension 5 mL  5 mL Oral Q12H PRN Theodis Blaze, MD      . dexamethasone (DECADRON) injection 4 mg  4 mg Intravenous 4 times per day Venetia Maxon Rama, MD   4 mg at 04/20/15 3086  . feeding supplement (ENSURE ENLIVE) (ENSURE ENLIVE) liquid 237 mL  237 mL Oral TID BM Clayton Bibles, RD   237 mL at 04/18/15 2223  . fentaNYL (DURAGESIC - dosed mcg/hr) 75 mcg  75 mcg Transdermal Q72H Reyne Dumas, MD   75 mcg at 04/16/15 1330  . haloperidol lactate (HALDOL) injection 2.5 mg  2.5 mg Intravenous Q6H PRN Rhetta Mura Schorr, NP   2.5 mg at 04/18/15 1501  . HYDROmorphone (DILAUDID) injection 1-2 mg  1-2 mg Intravenous Q4H PRN Hosie Poisson, MD   1 mg at 04/20/15 1048  . LORazepam (ATIVAN) tablet 1 mg  1 mg Oral Q8H PRN Theodis Blaze, MD   1 mg at 04/03/15 1531  . morphine (MS CONTIN) 12 hr tablet 60 mg  60 mg Oral Q12H Theodis Blaze, MD   60 mg at 04/20/15 1048  .  morphine (MSIR) tablet 15 mg  15 mg Oral Q4H PRN Reyne Dumas, MD   15 mg at 04/18/15 1922  . MUSCLE RUB CREA   Topical PRN Dionne Milo, NP      . ondansetron [facility]) tablet 4 mg  4 mg Oral Q6H PRN Theodis Blaze, MD       Or  . ondansetron [facility]) injection 4 mg  4 mg Intravenous Q6H PRN Theodis Blaze, MD      . polyethylene glycol (MIRALAX / GLYCOLAX) packet 17 g  17 g Oral BID Venetia Maxon Rama, MD   17 g at 04/20/15 1047  . QUEtiapine (SEROQUEL XR) 24 hr tablet 100 mg  100 mg Oral Daily Theodis Blaze, MD   100 mg at 04/20/15 1047  . senna-docusate (Senokot-S) tablet 1 tablet  1 tablet Oral BID Theodis Blaze, MD   1 tablet at 04/20/15 1047  . traZODone (DESYREL) tablet 100 mg  100 mg Oral QHS PRN Theodis Blaze, MD   100 mg at 04/19/15 2210    Musculoskeletal: Strength & Muscle Tone: decreased Gait & Station: unable to stand Patient leans: N/A  Psychiatric Specialty Exam: Physical Exam as per history and physical  ROS complains about generalized weakness, intractable pain, confusion, paranoia  but denies nausea vomiting chest pain and shortness of breath. Patient has weight loss and has a poor appetite. No Fever-chills, No Headache, No changes with Vision or hearing, reports vertigo No problems swallowing food or Liquids, No Chest pain, Cough or Shortness of Breath, No Abdominal pain, No Nausea or Vommitting, Bowel movements are regular, No Blood in stool or Urine, No dysuria, No new skin rashes or bruises, No new joints pains-aches,  No new weakness, tingling, numbness in any extremity, No recent weight gain or loss, No polyuria, polydypsia or polyphagia,   A full 10 point Review of Systems was done, except as stated above, all other Review of Systems were negative.   Blood pressure 97/40, pulse 72, temperature 97.7 F (36.5 C), temperature source Oral, resp. rate 16, height 6' 4.5" (1.943 m), weight 59.24 kg (130 lb 9.6 oz), SpO2 99 %.Body mass index is 15.69 kg/(m^2).   General Appearance: Guarded  Eye Contact::  Good  Speech:  Clear and  Coherent  Volume:  Decreased  Mood:  Depressed  Affect:  Constricted and Depressed  Thought Process:  Circumstantial, Disorganized, Irrelevant and Tangential  Orientation:  Full (Time, Place, and Person)  Thought Content:  Paranoid Ideation and Rumination  Suicidal Thoughts:  No  Homicidal Thoughts:  No  Memory:  Immediate;   Fair Recent;   Poor  Judgement:  Fair  Insight:  Fair  Psychomotor Activity:  Easily agitated, aggressive and combative  Concentration:  Fair  Recall:  [employer] of Knowledge:Good  Language: Good  Akathisia:  Negative  Handed:  Right  AIMS (if indicated):     Assets:  Communication Skills Desire for Improvement Financial Resources/Insurance Housing Leisure Time Resilience Social Support  ADL's:  Intact  Cognition: Impaired,  Mild  Sleep:      Medical Decision Making: Review of Psycho-Social Stressors (1), Review or order clinical lab tests (1), Established Problem, Worsening (2), Review of Last Therapy Session (1), Review or order medicine tests (1), Review of Medication Regimen & Side Effects (2) and Review of New Medication or Change in Dosage (2)  Treatment Plan Summary:   Daily contact with patient to assess and evaluate symptoms and progress in treatment and Medication management  Plan:  Patient does not have capacity to make his own medical decisions and living arrangement based on my evaluation today Reportedly patient signed medical care power of attorney to his mother  Contact psych social service if family needs assistance with [facility] Continue Seroquel XL 100 mg daily at bedtime for agitation and insomnia  Patient does not meet criteria for psychiatric inpatient admission. Supportive therapy provided about ongoing stressors.  Appreciate psychiatric consultation and we sign off today  Please contact [phone] or [phone] if needs further assistance  Disposition:  Patient will benefit from comfort care/palliative care at this time as patient condition is concluded noncurable.   Frank Ward,JANARDHAHA R. 04/20/2015 11:42 AM

## 2015-04-20 NOTE — Consult Note (Signed)
[facility] Liaison: Received request from [place] for family interest in [facility]. Chart reviewed and received report form Suzanna and other staff caring for patient today. Understand this is difficult decision for family to make given patient is clearing making the request to return home. Note family continues to seek input and process decision. Will continue to follow and update CSW. Thank you. Erling Conte, [place]

## 2015-04-20 NOTE — Progress Notes (Signed)
PT Cancellation Note  Patient Details Name: Frank Ward MRN: [id number] DOB: 24-Jan-1971   Cancelled Treatment:    Reason Eval/Treat Not Completed: Other (comment) Pt speaking with RN and CSW.  It does not appear to be a good time for therapy.  Will check back as schedule permits.   [name],KATHrine E 04/20/2015, 11:38 AM Carmelia Bake, PT, DPT 04/20/2015 Pager: [phone]

## 2015-04-20 NOTE — Progress Notes (Addendum)
Progress Note   Frank Ward VOZ:[id number] DOB: 1970/12/30 DOA: 04/01/2015 PCP: No PCP Per Patient   Brief Narrative:   Frank Ward is an 44 y.o. 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 Patient underwent psychiatric evaluation 04/07/15 secondary to refusal of radiation treatments and was diagnosed with adjustment disorder with mixed anxiety and depressed mood. He was felt to have capacity to make his own medical decisions. He is currently undergoing ongoing radiation treatments and the radiation oncologist is considering radioactive iodine therapy however his TSH needs to be greater than 30 before this can be done. Synthroid on hold pending further treatment recommendations by radiation oncologist.  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 - Status post thyroidectomy (01/29/13) as well as ablative radioactive iodine. - Has been noncompliant with treatment planning as an outpatient with failure to follow-up on multiple occasions. - Pain control efforts limited by refusal of oral pain medications. He was persistent that we start him on dilaudid or he is going to leave the hospital AMA on 9/3. Started on dilaudid 1 to 2 mg every 4 hours.    - Spoke with Dr. Sondra Come 04/09/15 regarding possibility of treatment with additional  radioactive iodine.  - TSH elevated. Needs to be greater than 30 for radioactive iodine treatment. Hold Synthroid. Repeat TSH 04/13/15 18.9. - Dr. Sondra Come continues to follow him and is considering additional  radiation treatments to his left shoulder area.  We had a multidisciplinary meeting on 9/1 with oncology. According to oncology assessment,Frank Ward has incurable thryoid cancer. His prognosis is, in oncology 's opinion, is weeks. He is a good candidate for [employer]. Patient is not acceptable of his prognosis.. At this point we should focus on pain management as patient has ran out of all treatment options.   Strongly recommend Frank Ward placement at this time. [name], MD does not recommend any radiation treatments at this time. Given poor prognosis for oncology and radiation oncology, discussed with the patient's mother extensively on the phone at [phone]. Family decided for home with hospice care and are trying to get other family members to help witht he situation. Spoke to mom and the patient's sis ter in detail over the phone and answered their questions.    Pain management-continue Duragesic patch, morphine sulfate immediate release, MS Contin. He was started on IV dialudid as he reports nothing works for him. He reports he understands that he will not get IV dilaudid at home.     Fall-patient very noncompliant , discontinued sitter, multiple findings on CT scan of the cervical spine and CT of the head but no acute trauma.    Diplopia and tinnitis - Reports a 1 week history of diplopia and a ringing in his right ear (but didn't tell anyone). - MRI brain shows enlarging metastasis with vasogenic edema. IV Decadron started 04/13/15. Defer further management to Dr. Sondra Come . At this time no further treatments are planned    Hypothyroidism - Holding Synthroid as noted above.    Opiod-induced constipation - Continue Senokot, Colace and MiraLAX.    Severe protein calorie  malnutrition with cachexia - Continue Ensure supplements. Dietitian following. - Current weight 130 pounds (59.24 kg), an 8 pound weight loss from 03/03/15.    Noncompliance - Related to underlying  depression/anxiety and loss of control in this young man facing a terminal diagnosis. - Mood remains somewhat labile. Patient is incoherent and has no insight into his current medical condition. - repeat psychiatry eval for capacity requested.     Adjustment disorder with mixed anxiety and depressed mood - Refuses to take Seroquel, continue Ativan and trazodone as needed. - we feel that patient clearly lacks the ability to care for himself primarily and has no insight into his clinical situation because of a combination of medications such as narcotics and Decadron, brain metastasis, psychiatric issues.      Anemia in neoplastic disease - No current indication for transfusion.    DVT Prophylaxis - Lovenox ordered but has been refusing to take it.  Family Communication: No family present at the bedside. Spoke to mom and th e patient's sister over the phone.  Disposition Plan: home with home hospice.     Code Status:     Code Status Orders        Start     Ordered   04/01/15 1930  Full code   Continuous     04/01/15 1929      IV Access:    Peripheral IV   Procedures and diagnostic studies:   Ct Head Wo Contrast  04/15/2015   CLINICAL DATA:  Status post fall out of bed. Hit left temporal region, with laceration. Combative. Metastatic thyroid cancer. Initial encounter.  EXAM: CT HEAD WITHOUT CONTRAST  CT CERVICAL SPINE WITHOUT CONTRAST  TECHNIQUE: Multidetector CT imaging of the head and cervical spine was performed following the standard protocol without intravenous contrast. Multiplanar CT image reconstructions of the cervical spine were also generated.  COMPARISON:  MRI of the brain performed 04/13/2015, and CT of the head performed 02/06/2014. CT of the cervical spine performed 07/26/2013  FINDINGS: CT HEAD FINDINGS  A 1.6 x 1.4 cm high attenuation mass is again noted at the high left parietal lobe, with diffusely decreased white matter attenuation involving much of the left  parietal and frontal lobes. A complex mass is again noted at the fourth ventricle, measuring 3.0 x 1.7 cm. There is minimal rightward midline shift, measuring 3-4 mm.  Prominence of the ventricles and sulci reflects mild cortical volume loss. Periventricular white matter change likely reflects small vessel ischemic microangiopathy.  There is no evidence of fracture; visualized osseous structures are unremarkable in appearance. The orbits are within normal limits. There is opacification of the right frontal sinus. Mild mucosal thickening is noted at the left maxillary sinus. The remaining paranasal sinuses and mastoid air cells are well-aerated. No significant soft tissue abnormalities are seen.  CT CERVICAL SPINE FINDINGS  Since the prior study in 2014, there has been further collapse of the pathologic fractures of C3 and C4, with approximately 4 mm of retropulsion noted at C3, and narrowing of the spinal canal to 6-7 mm in AP dimension on sagittal images. There is corresponding mild grade 1 retrolisthesis of C2 on C3. An apparent chronic lesion is noted at the inferior endplate of T1, grossly unchanged in appearance. Prevertebral soft tissues are within normal limits.  The thyroid gland is not well seen. The visualized lung apices are clear. No significant soft tissue abnormalities are seen.  IMPRESSION: 1. No evidence of traumatic intracranial injury or fracture. 2. Further  collapse of the pathologic fractures of C3 and C4 since 2014, with approximately 4 mm of the retropulsion noted at C3, and narrowing of the spinal canal to 6-7 mm in AP dimension on sagittal images. Corresponding mild grade 1 retrolisthesis of C2 on C3. 3. Complex mass again noted at the fourth ventricle, measuring 3.0 x 1.7 cm. 4. 1.6 x 1.4 cm high attenuation mass again noted at the high left parietal lobe, with diffusely decreased white matter attenuation involving much of the left parietal and frontal lobes. Minimal associated rightward  midline shift, measuring 3-4 mm. 5. Apparent chronic lesion again noted at the inferior endplate of C1, stable from 2014. 6. Mild cortical volume loss and scattered small vessel ischemic microangiopathy. 7. Opacification of the right frontal sinus. Mild mucosal thickening at the left maxillary sinus. These results were called by telephone at the time of interpretation on 04/15/2015 at 4:42 am to [facility] on [place], who verbally acknowledged these results.   Electronically Signed   By: Garald Balding M.D.   On: 04/15/2015 04:42   Ct Chest Wo Contrast  04/04/2015   CLINICAL DATA:  History of metastatic thyroid cancer  EXAM: CT CHEST WITHOUT CONTRAST  TECHNIQUE: Multidetector CT imaging of the chest was performed following the standard protocol without IV contrast.  COMPARISON:  12/24/2014  FINDINGS: The lungs are well aerated bilaterally. The moderate right-sided pleural effusion is again identified stable from the previous exam. No significant left pleural effusion is seen. There are multiple too-numerous-to-count pulmonary mass is identified bilaterally. Largest of these on the left measures approximately 3.4 cm and is stable in size from the prior exam. Conglomeration of nodules is noted centrally. No significant hilar or mediastinal adenopathy is noted.  The bony structures show multiple areas of metastatic disease. An expansile lytic lesion is noted within the scapula on the right laterally just below the glenoid and extending into the inferior aspect of the glenoid. Additionally an expansile lesion is noted within the right anterior chest wall. It measures approximately 5.8 cm in greatest dimension and is slightly enlarged from the prior exam. Multiple spinal lesions are seen. The areas of worst lytic destruction are noted in the left vertebral body at T10 as well as the left half of the vertebral body at T12 and within the pedicle and lamina and extending laterally from the L1. The changes at L1 show  encroachment and destruction of the posterior wall of the spinal canal although no definitive soft tissue component is seen within the canal. The lack of IV contrast somewhat limits evaluation. The remainder the upper abdomen is within normal limits with the exception of metastatic disease of the left adrenal gland. The adrenal nodule measures 2.7 cm increased from 1.9 cm on the prior exam.  IMPRESSION: Changes consistent with the given clinical history of metastatic thyroid cancer to the lungs and bones as well as the left adrenal gland. There is been some slight increase in the degree of bony abnormalities. The lung abnormalities are roughly stable from the prior exam.  Stable right-sided effusion.   Electronically Signed   By: Inez Catalina M.D.   On: 04/04/2015 14:16   Ct Cervical Spine Wo Contrast  04/15/2015   CLINICAL DATA:  Status post fall out of bed. Hit left temporal region, with laceration. Combative. Metastatic thyroid cancer. Initial encounter.  EXAM: CT HEAD WITHOUT CONTRAST  CT CERVICAL SPINE WITHOUT CONTRAST  TECHNIQUE: Multidetector CT imaging of the head and cervical spine was performed following  the standard protocol without intravenous contrast. Multiplanar CT image reconstructions of the cervical spine were also generated.  COMPARISON:  MRI of the brain performed 04/13/2015, and CT of the head performed 02/06/2014. CT of the cervical spine performed 07/26/2013  FINDINGS: CT HEAD FINDINGS  A 1.6 x 1.4 cm high attenuation mass is again noted at the high left parietal lobe, with diffusely decreased white matter attenuation involving much of the left parietal and frontal lobes. A complex mass is again noted at the fourth ventricle, measuring 3.0 x 1.7 cm. There is minimal rightward midline shift, measuring 3-4 mm.  Prominence of the ventricles and sulci reflects mild cortical volume loss. Periventricular white matter change likely reflects small vessel ischemic microangiopathy.  There is no  evidence of fracture; visualized osseous structures are unremarkable in appearance. The orbits are within normal limits. There is opacification of the right frontal sinus. Mild mucosal thickening is noted at the left maxillary sinus. The remaining paranasal sinuses and mastoid air cells are well-aerated. No significant soft tissue abnormalities are seen.  CT CERVICAL SPINE FINDINGS  Since the prior study in 2014, there has been further collapse of the pathologic fractures of C3 and C4, with approximately 4 mm of retropulsion noted at C3, and narrowing of the spinal canal to 6-7 mm in AP dimension on sagittal images. There is corresponding mild grade 1 retrolisthesis of C2 on C3. An apparent chronic lesion is noted at the inferior endplate of T1, grossly unchanged in appearance. Prevertebral soft tissues are within normal limits.  The thyroid gland is not well seen. The visualized lung apices are clear. No significant soft tissue abnormalities are seen.  IMPRESSION: 1. No evidence of traumatic intracranial injury or fracture. 2. Further collapse of the pathologic fractures of C3 and C4 since 2014, with approximately 4 mm of the retropulsion noted at C3, and narrowing of the spinal canal to 6-7 mm in AP dimension on sagittal images. Corresponding mild grade 1 retrolisthesis of C2 on C3. 3. Complex mass again noted at the fourth ventricle, measuring 3.0 x 1.7 cm. 4. 1.6 x 1.4 cm high attenuation mass again noted at the high left parietal lobe, with diffusely decreased white matter attenuation involving much of the left parietal and frontal lobes. Minimal associated rightward midline shift, measuring 3-4 mm. 5. Apparent chronic lesion again noted at the inferior endplate of C1, stable from 2014. 6. Mild cortical volume loss and scattered small vessel ischemic microangiopathy. 7. Opacification of the right frontal sinus. Mild mucosal thickening at the left maxillary sinus. These results were called by telephone at the  time of interpretation on 04/15/2015 at 4:42 am to [facility] on [place], who verbally acknowledged these results.   Electronically Signed   By: Garald Balding M.D.   On: 04/15/2015 04:42   Frank Jeri Cos DG Contrast  04/13/2015   CLINICAL DATA:  Diplopia. Metastatic thyroid cancer. Osseous metastases.  EXAM: MRI HEAD WITHOUT AND WITH CONTRAST  TECHNIQUE: Multiplanar, multiecho pulse sequences of the brain and surrounding structures were obtained without and with intravenous contrast.  CONTRAST:  18mL MULTIHANCE GADOBENATE DIMEGLUMINE 529 MG/ML IV SOLN  COMPARISON:  MRI brain without and with contrast 05/09/2014.  FINDINGS: The a heterogeneous enhancing lesion centered in the fourth ventricle demonstrates marked interval enlargement acute and chronic blood products are evident within this lesion. There is significant increase in surrounding vasogenic edema. T2 changes extending to the cerebellar peduncle is bilaterally as well as the inferior colliculus on the right.  Although the lesion has enlarged and demonstrates intrinsic blood products, there is no obstructing hydrocephalus. Mild ventricular enlargement is stable, compatible with the degree of atrophy.  A 7 mm enhancing lesion along the right choroid plexus any punctate lesion within the right central sulcus are stable.  A new enhancing lesion within the anterior left parietal lobe measures 1.4 x 1.9 x 1.6 cm. There is significant surrounding edema and mass effect with partial effacement of the left lateral ventricle. Midline shift measures 5 mm at the foramen of [place].  No other enhancing parenchymal lesions are evident. Pathologic fracture of the C3 vertebral body is again noted with posterior extension of tumor and bone resulting and significant central canal stenosis at the C3 level. Chronic collapse of the C4 vertebral body is noted as well.  No acute infarct is present. Flow is present in the major intracranial arteries. Chronic opacification of the right  frontal sinus is noted. A polyp or mucous retention cyst is again noted within the left maxillary sinus. The remaining paranasal sinuses are clear. Increased fluid is present in the left mastoid air cells. No obstructing nasopharyngeal lesion is present.  IMPRESSION: 1. Increased size of heterogeneous enhancing mass lesion within the fourth ventricle, now measuring 3.0 x 2.2 x 2.2 cm. The lesion demonstrates acute and chronic intrinsic blood products. 2. New enhancing metastatic lesion in the anterior left parietal lobe with significant surrounding vasogenic edema and mass effect. Midline shift is 5 mm. Intrinsic blood products are noted. 3. Stable focal enhancing lesions along the right choroid plexus and within the right central sulcus. 4. Moderate atrophy and diffuse white matter disease is otherwise stable. 5. Chronic right frontal sinus disease. 6. Left mastoid effusion. No obstructing nasopharyngeal lesion is evident.   Electronically Signed   By: San Morelle M.D.   On: 04/13/2015 12:34   Dg Chest Port 1 View  04/01/2015   CLINICAL DATA:  Left clavicle and shoulder pain. Thyroid cancer with metastatic bone disease.  EXAM: PORTABLE CHEST - 1 VIEW  COMPARISON:  CT chest 12/24/2014.  FINDINGS: The heart size is normal. Multiple bilateral pulmonary nodules are present bilaterally compatible with known metastatic disease. There has been some progression since the prior study. No focal airspace consolidation is evident otherwise.  Rightward curvature is present in the lower thoracic spine due to collapse pathologic left-sided fracture, unchanged. An erosive lesion is again noted in the right scapula. No definite new lesions are evident.  IMPRESSION: 1. Progression of numerous pulmonary nodules compatible with known metastatic disease. 2. Rightward curvature of the thoracic spine is secondary to asymmetric left-sided pathologic fractures, unchanged. 3. Stable erosive lesion in the right scapula.    Electronically Signed   By: San Morelle M.D.   On: 04/01/2015 16:19     Medical Consultants:    Radiation Oncology: [name], MD  Psychiatry: Ambrose Finland, MD  Oncology: Chauncey Cruel, MD  Anti-Infectives:    None.  Subjective:   Hardy Danelle Earthly patient wants to leave home this afternoon with family.   Objective:    Filed Vitals:   04/19/15 1345 04/19/15 2245 04/20/15 0539 04/20/15 1524  BP: 105/57 103/55 97/40 104/67  Pulse: 85 80 72 74  Temp: 98 F (36.7 C) 97.5 F (36.4 C) 97.7 F (36.5 C) 97.8 F (36.6 C)  TempSrc: Oral Oral Oral Oral  Resp: 18 16 16 16   Height:      Weight:      SpO2: 100% 100% 99% 100%  Intake/Output Summary (Last 24 hours) at 04/20/15 1838 Last data filed at 04/20/15 1632  Gross per 24 hour  Intake    600 ml  Output    550 ml  Net     50 ml    Exam: Gen: NAD Psych: agitated.  Cardiovascular:  RRR, No M/R/G Respiratory:  Lungs diminished Gastrointestinal:  Abdomen soft, NT/ND, + BS Extremities:  No C/E/C   Data Reviewed:    Labs: Basic Metabolic Panel:  Recent Labs Lab 04/15/15 1110  NA 140  K 4.2  CL 105  CO2 27  GLUCOSE 141*  BUN 14  CREATININE 0.73  CALCIUM 9.1  MG 1.9   GFR Estimated Creatinine Clearance: 96.6 mL/min (by C-G formula based on Cr of 0.73).  CBC:  Recent Labs Lab 04/16/15 0432  WBC 9.4  HGB 8.6*  HCT 26.6*  MCV 89.0  PLT 512*   CBG: No results for input(s): GLUCAP in the last 168 hours. Microbiology No results found for this or any previous visit (from the past 240 hour(s)).   Medications:   . dexamethasone  4 mg Intravenous 4 times per day  . feeding supplement (ENSURE ENLIVE)  237 mL Oral TID BM  . fentaNYL  75 mcg Transdermal Q72H  . morphine  60 mg Oral Q12H  . polyethylene glycol  17 g Oral BID  . QUEtiapine  100 mg Oral Daily  . senna-docusate  1 tablet Oral BID   Continuous Infusions:   Time spent: 45 minutes.   LOS: 19 days    [place] [facility] Pager [phone]  *Please refer to [email or web address], password [email or web address] to get updated schedule on who will round on this patient, as hospitalists switch teams weekly. If 7PM-7AM, please contact night-coverage at [email or web address], password [email or web address] for any overnight needs.  04/20/2015, 6:38 PM

## 2015-04-20 NOTE — Progress Notes (Signed)
Pt's mother and sister spoke with Suzanna the social worker, about taking pt home with Hospice. The were informed that hospice care doesn't care for pt 24/7, and that they would need to have family members involved and commited to taking care of him. They stated they were and would get other family members to commit to helping. Pt looking forward to going back to his apartment and enjoying what's left of his life. Family to contact Suzanna 9/6 about their family plan to take care of pt.

## 2015-04-20 NOTE — Progress Notes (Signed)
CSW continuing to follow.   CSW received notification from nurse tech that pt mother and pt sister had arrived to pt bedside.   CSW met with pt mother and pt sister alone.   CSW discussed with pt mother and pt sister surround disposition planning.   Pt mother and pt sister discussed their understanding of pt limited prognosis. Pt sister assisted in directing pt mother to make decision regarding disposition plan. Pt mother discussed that pt recently got an apartment and pt sister confirmed that she has seen the apartment. Pt mother discussed that pt has a lot of pride in being obtain that apartment and it is very important to the pt to be able to return to the apartment. CSW discussed in detail with pt mother and pt sister that hospice will not provide 24 hour care in the home. Pt mother stated that there are at least 6 family members that can assist in pt care at home and provide 24 hour care. CSW encourage pt mother to go ahead and speak with pt family members and develop a scheduled for pt care at home because as soon as the equipment can be delivered to the home then the MD will be able to discharge the patient. Pt mother expressed understanding and plans to begin making the schedule with pt family. Pt sister confirmed that she will be able to assist with pt care and is hopeful that pt other family members will assist in pt care as they have done in the past with other family members. Pt mother plans to reach out to family this evening and tomorrow morning to develop a plan for pt care in the home. Pt mother and pt sister are agreeable to [facility] to follow in the home and state that pt will have many equipment needs. CSW discussed that CSW will notify RNCM tomorrow in order for RNCM to make referral to [facility] and discuss with pt family regarding equipment needs.   MD notified that pt members are at bedside and wishing to speak with MD in person.   CSW  to follow up with RNCM in the morning regarding home hospice needs.   Suzanna Kidd, MSW, LCSW Clinical Social Work [phone] 

## 2015-04-21 MED ORDER — WHITE PETROLATUM GEL
1.0000 "application " | Status: DC | PRN
  Filled 2015-04-21: qty 28.35

## 2015-04-21 MED ORDER — WHITE PETROLATUM GEL
1.0000 "application " | Status: DC | PRN
  Filled 2015-04-21: qty 5

## 2015-04-21 MED ORDER — HYDROMORPHONE HCL 1 MG/ML IJ SOLN
1.0000 mg | INTRAMUSCULAR | Status: DC | PRN
  Administered 2015-04-21: 2 mg via INTRAVENOUS
  Administered 2015-04-21: 1 mg via INTRAVENOUS
  Administered 2015-04-21 – 2015-04-23 (×8): 2 mg via INTRAVENOUS
  Administered 2015-04-23: 1 mg via INTRAVENOUS
  Filled 2015-04-21: qty 1
  Filled 2015-04-21 (×10): qty 2

## 2015-04-21 NOTE — Progress Notes (Signed)
CSW continuing to follow.   CSW received notification from MD that although family decided for home with hospice care and are trying to get other family members to help witht he situation on 9/5, but the morning of 9/6, they changed their decision and wanted him to go to [facility], as there are not enough family members to help him at home. MD asking for CSW to notify [facility] that pt family would like [employer].  CSW contacted [employer], Erling Conte and updated that pt family have changed their mind back to being interested in [facility]. [facility] will process referral.   CSW to continue to follow.  Alison Murray, MSW, [place] Work [phone]

## 2015-04-21 NOTE — Progress Notes (Signed)
Progress Note   Frank Ward QBH:[id number] DOB: 10-22-1970 DOA: 04/01/2015 PCP: No PCP Per Patient   Brief Narrative:   Frank Ward is an 44 y.o. 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 Patient underwent psychiatric evaluation 04/07/15 secondary to refusal of radiation treatments and was diagnosed with adjustment disorder with mixed anxiety and depressed mood. He was felt to have capacity to make his own medical decisions. He is currently undergoing ongoing radiation treatments and the radiation oncologist is considering radioactive iodine therapy however his TSH needs to be greater than 30 before this can be done. Synthroid on hold pending further treatment recommendations by radiation oncologist.  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 - Status post thyroidectomy (01/29/13) as well as ablative radioactive iodine. - Has been noncompliant with treatment planning as an outpatient with failure to follow-up on multiple occasions. - Pain control efforts limited by refusal of oral pain medications. He was persistent that we start him on dilaudid or he is going to leave the hospital AMA on 9/3. Started on dilaudid 1 to 2 mg every 4 hours.     - Spoke with Dr. Sondra Come 04/09/15 regarding possibility of treatment with additional  radioactive iodine.  - TSH elevated. Needs to be greater than 30 for radioactive iodine treatment. Holding Synthroid. Repeat TSH 04/13/15 18.9. - Dr. Sondra Come continues to follow him and is considering  additional radiation treatments to his left shoulder area.  We had a multidisciplinary meeting on 9/1 with oncology. According to oncology assessment,Frank Ward has incurable thryoid cancer. His prognosis is, in oncology 's opinion, is weeks. He is a good candidate for [facility]. Patient is not acceptable of his prognosis. . At this point we should focus on pain management as patient has ran out of all treatment options. This decision  is deferred to radiation oncology.  Strongly recommend [name] at this time. [name], MD does not recommend any radiation treatments at this time. Given poor prognosis for oncology and radiation oncology, I discussed with the patient's mother extensively on the phone at [phone]. Family decided for home with hospice care and are trying to get other family members to help witht he situation on 9/5, but the morning of 9/6, they changed their decision and wanted him to go to [facility], as there are not enough family members to help him at home.    Pain management-continue Duragesic patch, morphine sulfate immediate release, MS Contin. He was started on IV dialudid as he reports nothing works for him.     Fall-patient very noncompliant , discontinued sitter, multiple findings on CT scan of the cervical spine and CT of the head but no acute trauma.    Diplopia and tinnitis - Reports a 1 week history of diplopia and a ringing in his right ear (but didn't tell anyone). - MRI brain shows enlarging metastasis with vasogenic edema.Defer further management to Dr. Sondra Come . At this time no further treatments are planned    Hypothyroidism - Holding Synthroid as noted above.    Opiod-induced constipation - Continue Senokot, Colace and MiraLAX.  Severe protein calorie malnutrition with cachexia - Continue Ensure supplements. Dietitian following.     Noncompliance - Related to underlying depression/anxiety and loss of control in this  young man facing a terminal diagnosis. - Status post psychiatry evaluation swhere he was found not to have capacity to make decisions.  - Mood remains somewhat labile. Patient is incoherent and has no insight into his current medical condition.    Adjustment disorder with mixed anxiety and depressed mood - Refuses to take Seroquel, continue Ativan and trazodone as needed. - Per psychiatrist he does not have capacity to make decisions.  We feel that patient clearly lacks the ability to care for himself primarily and has no insight into his clinical situation because of a combination of medications such as narcotics and Decadron, brain metastasis, psychiatric issues.  -    Anemia in neoplastic disease - No current indication for transfusion.    DVT Prophylaxis - Lovenox ordered but has been refusing to take it.  Family Communication: No family present at the bedside. Discussed with mother and brother.  Disposition Plan: family decided to send him to [facility].   Code Status:     Code Status Orders        Start     Ordered   04/01/15 1930  Full code   Continuous     04/01/15 1929      IV Access:    Peripheral IV   Procedures and diagnostic studies:   Ct Head Wo Contrast  04/15/2015   CLINICAL DATA:  Status post fall out of bed. Hit left temporal region, with laceration. Combative. Metastatic thyroid cancer. Initial encounter.  EXAM: CT HEAD WITHOUT CONTRAST  CT CERVICAL SPINE WITHOUT CONTRAST  TECHNIQUE: Multidetector CT imaging of the head and cervical spine was performed following the standard protocol without intravenous contrast. Multiplanar CT image reconstructions of the cervical spine were also generated.  COMPARISON:  MRI of the brain performed 04/13/2015, and CT of the head performed 02/06/2014. CT of the cervical spine performed 07/26/2013  FINDINGS: CT HEAD FINDINGS  A 1.6 x 1.4 cm high attenuation mass is again noted at the high left parietal lobe, with diffusely  decreased white matter attenuation involving much of the left parietal and frontal lobes. A complex mass is again noted at the fourth ventricle, measuring 3.0 x 1.7 cm. There is minimal rightward midline shift, measuring 3-4 mm.  Prominence of the ventricles and sulci reflects mild cortical volume loss. Periventricular white matter change likely reflects small vessel ischemic microangiopathy.  There is no evidence of fracture; visualized osseous structures are unremarkable in appearance. The orbits are within normal limits. There is opacification of the right frontal sinus. Mild mucosal thickening is noted at the left maxillary sinus. The remaining paranasal sinuses and mastoid air cells are well-aerated. No significant soft tissue abnormalities are seen.  CT CERVICAL SPINE FINDINGS  Since the prior study in 2014, there has been further collapse of the pathologic fractures of C3 and C4, with approximately 4 mm of retropulsion noted at C3, and narrowing of the spinal canal to 6-7 mm in AP dimension on sagittal images. There is corresponding mild grade 1 retrolisthesis of C2 on C3. An apparent chronic lesion is noted at the inferior endplate of T1, grossly unchanged in appearance. Prevertebral soft tissues are within normal limits.  The thyroid gland is not well seen. The visualized lung apices are clear. No significant soft tissue abnormalities are seen.  IMPRESSION: 1. No evidence of traumatic intracranial  injury or fracture. 2. Further collapse of the pathologic fractures of C3 and C4 since 2014, with approximately 4 mm of the retropulsion noted at C3, and narrowing of the spinal canal to 6-7 mm in AP dimension on sagittal images. Corresponding mild grade 1 retrolisthesis of C2 on C3. 3. Complex mass again noted at the fourth ventricle, measuring 3.0 x 1.7 cm. 4. 1.6 x 1.4 cm high attenuation mass again noted at the high left parietal lobe, with diffusely decreased white matter attenuation involving much of the  left parietal and frontal lobes. Minimal associated rightward midline shift, measuring 3-4 mm. 5. Apparent chronic lesion again noted at the inferior endplate of C1, stable from 2014. 6. Mild cortical volume loss and scattered small vessel ischemic microangiopathy. 7. Opacification of the right frontal sinus. Mild mucosal thickening at the left maxillary sinus. These results were called by telephone at the time of interpretation on 04/15/2015 at 4:42 am to [facility] on [address], who verbally acknowledged these results.   Electronically Signed   By: Garald Balding M.D.   On: 04/15/2015 04:42   Ct Chest Wo Contrast  04/04/2015   CLINICAL DATA:  History of metastatic thyroid cancer  EXAM: CT CHEST WITHOUT CONTRAST  TECHNIQUE: Multidetector CT imaging of the chest was performed following the standard protocol without IV contrast.  COMPARISON:  12/24/2014  FINDINGS: The lungs are well aerated bilaterally. The moderate right-sided pleural effusion is again identified stable from the previous exam. No significant left pleural effusion is seen. There are multiple too-numerous-to-count pulmonary mass is identified bilaterally. Largest of these on the left measures approximately 3.4 cm and is stable in size from the prior exam. Conglomeration of nodules is noted centrally. No significant hilar or mediastinal adenopathy is noted.  The bony structures show multiple areas of metastatic disease. An expansile lytic lesion is noted within the scapula on the right laterally just below the glenoid and extending into the inferior aspect of the glenoid. Additionally an expansile lesion is noted within the right anterior chest wall. It measures approximately 5.8 cm in greatest dimension and is slightly enlarged from the prior exam. Multiple spinal lesions are seen. The areas of worst lytic destruction are noted in the left vertebral body at T10 as well as the left half of the vertebral body at T12 and within the pedicle and lamina and  extending laterally from the L1. The changes at L1 show encroachment and destruction of the posterior wall of the spinal canal although no definitive soft tissue component is seen within the canal. The lack of IV contrast somewhat limits evaluation. The remainder the upper abdomen is within normal limits with the exception of metastatic disease of the left adrenal gland. The adrenal nodule measures 2.7 cm increased from 1.9 cm on the prior exam.  IMPRESSION: Changes consistent with the given clinical history of metastatic thyroid cancer to the lungs and bones as well as the left adrenal gland. There is been some slight increase in the degree of bony abnormalities. The lung abnormalities are roughly stable from the prior exam.  Stable right-sided effusion.   Electronically Signed   By: Inez Catalina M.D.   On: 04/04/2015 14:16   Ct Cervical Spine Wo Contrast  04/15/2015   CLINICAL DATA:  Status post fall out of bed. Hit left temporal region, with laceration. Combative. Metastatic thyroid cancer. Initial encounter.  EXAM: CT HEAD WITHOUT CONTRAST  CT CERVICAL SPINE WITHOUT CONTRAST  TECHNIQUE: Multidetector CT imaging of the head and  cervical spine was performed following the standard protocol without intravenous contrast. Multiplanar CT image reconstructions of the cervical spine were also generated.  COMPARISON:  MRI of the brain performed 04/13/2015, and CT of the head performed 02/06/2014. CT of the cervical spine performed 07/26/2013  FINDINGS: CT HEAD FINDINGS  A 1.6 x 1.4 cm high attenuation mass is again noted at the high left parietal lobe, with diffusely decreased white matter attenuation involving much of the left parietal and frontal lobes. A complex mass is again noted at the fourth ventricle, measuring 3.0 x 1.7 cm. There is minimal rightward midline shift, measuring 3-4 mm.  Prominence of the ventricles and sulci reflects mild cortical volume loss. Periventricular white matter change likely reflects  small vessel ischemic microangiopathy.  There is no evidence of fracture; visualized osseous structures are unremarkable in appearance. The orbits are within normal limits. There is opacification of the right frontal sinus. Mild mucosal thickening is noted at the left maxillary sinus. The remaining paranasal sinuses and mastoid air cells are well-aerated. No significant soft tissue abnormalities are seen.  CT CERVICAL SPINE FINDINGS  Since the prior study in 2014, there has been further collapse of the pathologic fractures of C3 and C4, with approximately 4 mm of retropulsion noted at C3, and narrowing of the spinal canal to 6-7 mm in AP dimension on sagittal images. There is corresponding mild grade 1 retrolisthesis of C2 on C3. An apparent chronic lesion is noted at the inferior endplate of T1, grossly unchanged in appearance. Prevertebral soft tissues are within normal limits.  The thyroid gland is not well seen. The visualized lung apices are clear. No significant soft tissue abnormalities are seen.  IMPRESSION: 1. No evidence of traumatic intracranial injury or fracture. 2. Further collapse of the pathologic fractures of C3 and C4 since 2014, with approximately 4 mm of the retropulsion noted at C3, and narrowing of the spinal canal to 6-7 mm in AP dimension on sagittal images. Corresponding mild grade 1 retrolisthesis of C2 on C3. 3. Complex mass again noted at the fourth ventricle, measuring 3.0 x 1.7 cm. 4. 1.6 x 1.4 cm high attenuation mass again noted at the high left parietal lobe, with diffusely decreased white matter attenuation involving much of the left parietal and frontal lobes. Minimal associated rightward midline shift, measuring 3-4 mm. 5. Apparent chronic lesion again noted at the inferior endplate of C1, stable from 2014. 6. Mild cortical volume loss and scattered small vessel ischemic microangiopathy. 7. Opacification of the right frontal sinus. Mild mucosal thickening at the left maxillary  sinus. These results were called by telephone at the time of interpretation on 04/15/2015 at 4:42 am to [facility] on [place], who verbally acknowledged these results.   Electronically Signed   By: Garald Balding M.D.   On: 04/15/2015 04:42   Frank Ward AT Contrast  04/13/2015   CLINICAL DATA:  Diplopia. Metastatic thyroid cancer. Osseous metastases.  EXAM: MRI HEAD WITHOUT AND WITH CONTRAST  TECHNIQUE: Multiplanar, multiecho pulse sequences of the brain and surrounding structures were obtained without and with intravenous contrast.  CONTRAST:  69mL MULTIHANCE GADOBENATE DIMEGLUMINE 529 MG/ML IV SOLN  COMPARISON:  MRI brain without and with contrast 05/09/2014.  FINDINGS: The a heterogeneous enhancing lesion centered in the fourth ventricle demonstrates marked interval enlargement acute and chronic blood products are evident within this lesion. There is significant increase in surrounding vasogenic edema. T2 changes extending to the cerebellar peduncle is bilaterally as well as the inferior  colliculus on the right.  Although the lesion has enlarged and demonstrates intrinsic blood products, there is no obstructing hydrocephalus. Mild ventricular enlargement is stable, compatible with the degree of atrophy.  A 7 mm enhancing lesion along the right choroid plexus any punctate lesion within the right central sulcus are stable.  A new enhancing lesion within the anterior left parietal lobe measures 1.4 x 1.9 x 1.6 cm. There is significant surrounding edema and mass effect with partial effacement of the left lateral ventricle. Midline shift measures 5 mm at the foramen of Monro.  No other enhancing parenchymal lesions are evident. Pathologic fracture of the C3 vertebral body is again noted with posterior extension of tumor and bone resulting and significant central canal stenosis at the C3 level. Chronic collapse of the C4 vertebral body is noted as well.  No acute infarct is present. Flow is present in the major  intracranial arteries. Chronic opacification of the right frontal sinus is noted. A polyp or mucous retention cyst is again noted within the left maxillary sinus. The remaining paranasal sinuses are clear. Increased fluid is present in the left mastoid air cells. No obstructing nasopharyngeal lesion is present.  IMPRESSION: 1. Increased size of heterogeneous enhancing mass lesion within the fourth ventricle, now measuring 3.0 x 2.2 x 2.2 cm. The lesion demonstrates acute and chronic intrinsic blood products. 2. New enhancing metastatic lesion in the anterior left parietal lobe with significant surrounding vasogenic edema and mass effect. Midline shift is 5 mm. Intrinsic blood products are noted. 3. Stable focal enhancing lesions along the right choroid plexus and within the right central sulcus. 4. Moderate atrophy and diffuse white matter disease is otherwise stable. 5. Chronic right frontal sinus disease. 6. Left mastoid effusion. No obstructing nasopharyngeal lesion is evident.   Electronically Signed   By: San Morelle M.D.   On: 04/13/2015 12:34   Dg Chest Port 1 View  04/01/2015   CLINICAL DATA:  Left clavicle and shoulder pain. Thyroid cancer with metastatic bone disease.  EXAM: PORTABLE CHEST - 1 VIEW  COMPARISON:  CT chest 12/24/2014.  FINDINGS: The heart size is normal. Multiple bilateral pulmonary nodules are present bilaterally compatible with known metastatic disease. There has been some progression since the prior study. No focal airspace consolidation is evident otherwise.  Rightward curvature is present in the lower thoracic spine due to collapse pathologic left-sided fracture, unchanged. An erosive lesion is again noted in the right scapula. No definite new lesions are evident.  IMPRESSION: 1. Progression of numerous pulmonary nodules compatible with known metastatic disease. 2. Rightward curvature of the thoracic spine is secondary to asymmetric left-sided pathologic fractures,  unchanged. 3. Stable erosive lesion in the right scapula.   Electronically Signed   By: San Morelle M.D.   On: 04/01/2015 16:19     Medical Consultants:    Radiation Oncology: [name], MD  Psychiatry: Ambrose Finland, MD  Oncology: Chauncey Cruel, MD  Anti-Infectives:    None.  Subjective:   Frank Ward asking me when his next pain medication is going to be.   Objective:    Filed Vitals:   04/20/15 1524 04/20/15 2010 04/20/15 2135 04/21/15 0459  BP: 104/67  125/68 111/61  Pulse: 74 80 76 85  Temp: 97.8 F (36.6 C)  98 F (36.7 C) 97.8 F (36.6 C)  TempSrc: Oral  Oral Oral  Resp: 16  16 16   Height:      Weight:      SpO2:  100%  100% 98%    Intake/Output Summary (Last 24 hours) at 04/21/15 1049 Last data filed at 04/21/15 0900  Gross per 24 hour  Intake   1830 ml  Output   1850 ml  Net    -20 ml    Exam: Gen: NAD Psych: agitated.  Cardiovascular:  RRR, No M/R/G Respiratory:  Lungs diminished Gastrointestinal:  Abdomen soft, NT/ND, + BS Extremities:  No C/E/C   Data Reviewed:    Labs: Basic Metabolic Panel:  Recent Labs Lab 04/15/15 1110  NA 140  K 4.2  CL 105  CO2 27  GLUCOSE 141*  BUN 14  CREATININE 0.73  CALCIUM 9.1  MG 1.9   GFR Estimated Creatinine Clearance: 96.6 mL/min (by C-G formula based on Cr of 0.73).  CBC:  Recent Labs Lab 04/16/15 0432  WBC 9.4  HGB 8.6*  HCT 26.6*  MCV 89.0  PLT 512*   CBG: No results for input(s): GLUCAP in the last 168 hours. Microbiology No results found for this or any previous visit (from the past 240 hour(s)).   Medications:   . dexamethasone  4 mg Intravenous 4 times per day  . feeding supplement (ENSURE ENLIVE)  237 mL Oral TID BM  . fentaNYL  75 mcg Transdermal Q72H  . morphine  60 mg Oral Q12H  . polyethylene glycol  17 g Oral BID  . QUEtiapine  100 mg Oral Daily  . senna-docusate  1 tablet Oral BID   Continuous Infusions:   Time spent:  45 minutes.   LOS: 20 days   [place] [facility] Pager [phone]  *Please refer to [email or web address], password [email or web address] to get updated schedule on who will round on this patient, as hospitalists switch teams weekly. If 7PM-7AM, please contact night-coverage at [email or web address], password [email or web address] for any overnight needs.  04/21/2015, 10:49 AM

## 2015-04-21 NOTE — Progress Notes (Signed)
Physical Therapy Discharge Patient Details Name: Frank Ward MRN: [id number] DOB: 08-15-1971 Today's Date: 04/21/2015 Time:  -     Patient discharged from PT services secondary to planned DC to home with Hospice and DME. No further PT indicated at this time.  Please see latest therapy progress note for current level of functioning and progress toward goals. Patient has not been able to participate over past several attempts.     GP    Tresa Endo PT [phone]  [name] 04/21/2015, 8:11 AM

## 2015-04-21 NOTE — Progress Notes (Addendum)
10:45 CM received call from [place] stating family has decided on [facility]; [place] arranging.  No other CM needs were communicated. 10:35 CM notes pt lacks capacity.  CM has called POA, Trudie Reed to arrange for Home hospice; mailbox was full and CM left SMS callback number.  Waiting for callback.

## 2015-04-22 ENCOUNTER — Encounter: Payer: Self-pay | Admitting: *Deleted

## 2015-04-22 MED ORDER — PANTOPRAZOLE SODIUM 40 MG PO TBEC
40.0000 mg | DELAYED_RELEASE_TABLET | Freq: Every day | ORAL | Status: DC
  Administered 2015-04-22 – 2015-04-23 (×2): 40 mg via ORAL
  Filled 2015-04-22 (×2): qty 1

## 2015-04-22 MED ORDER — FAMOTIDINE 20 MG PO TABS
20.0000 mg | ORAL_TABLET | Freq: Two times a day (BID) | ORAL | Status: DC
  Administered 2015-04-22 – 2015-04-23 (×2): 20 mg via ORAL
  Filled 2015-04-22 (×2): qty 1

## 2015-04-22 NOTE — Progress Notes (Signed)
Mr Frix tells me his mother, brother and sister have agreed to take him home, and that they will be coming for him tonight.  He also states SW has told him when he goes home (to his apartment) he will have a caregiver there.  I understand from staff family has stated they cannot care for the patient in their home(s) and cannot provide him with sufficient support in his apartment to make that a viable option at least for now.   I don't see Mr Sebald going to [facility] under these circumstances-- I do not hear that he has an understanding of what staying there would mean. I think within 24-48 hours of transfer he would be back in our ED.  I suspect SNF transfer may be the best we can do in this case.   Would it be possible to find resources for a daytime caregiver at the patient's apartment? If so, would a family member agree to sleep there nightly to provide the rest of the care?

## 2015-04-22 NOTE — Progress Notes (Signed)
CSW continuing to follow.  CSW received notification that oncologist is now stating that pt is not appropriate for [facility] and SNF vs home with care needs to be explored. Pt family is not able to care for pt at home as they initially were hopeful to do so.   CSW received notification from unit director that pt has expressed that he would be agreeable to go to rehab at [facility] and work with therapy in hopes that he can regain some strength. Unit Director has asked MD to re-consult PT and pt has stated that he is agreeable to participate.   CSW contacted [facility] as facility had offered a bed in initial initiation of SNF search. CSW updated facility on pt status and sent updated information. [facility] is reviewing information and will notify this CSW if facility is able to offer pt a bed.   CSW to follow up with pt family regarding plan once CSW has facility to offer for pt.   CSW to continue to follow to assist with pt disposition planning.  Alison Murray, MSW, [place] [facility] [phone]

## 2015-04-22 NOTE — Care Management Important Message (Signed)
Important Message  Patient Details  Name: Frank Ward MRN: [id number] Date of Birth: 05/22/1971   Medicare Important Message Given:  Yes-third notification given    [name] 04/22/2015, 12:34 [place] Message  Patient Details  Name: Frank Ward MRN: [id number] Date of Birth: [birth year]   Medicare Important Message Given:  Yes-third notification given    [name] 04/22/2015, 12:34 PM

## 2015-04-22 NOTE — Discharge Summary (Addendum)
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 He presented to the hospital for severe pain in the right side of his chest below the clavicle radiating to the left arm.  Hospital Course:  Metastatic thyroid cancer with metastases to brain, lungs, left adrenal gland and bone -Noncompliant with treatment and follow-up -Intermittent aggressive behavior and refusal of medications while in the hospital -Inter-disciplinary meeting on 9/1 between hospitalist, medical and radiation oncology determined that patient has incurable cancer and recommendations were made for hospice -He is been evaluated by hospice home and they feel that he would be a better candidate for rehabilitation rather than hospice home -Repeat psych eval done on 9/5-patient now determined not to have capacity to make appropriate decisions for himself -Plan is for patient to be discharged to a rehabilitation facility - palliative care can be called in  if/when it is needed - Pain management with MSIR, MS Contin, fentanyl patch  Diplopia and tendinitis -Possibly related to brain metastasis  Noncompliance -He underwent psychiatric eval and 8/23 due to refusal of treatments and was felt to have capacity to make decisions. -However, a repeat eval done on 9/5 was done and it was determined that he did not have the capacity to make propria decisions for himself -His mother is the POA whom we have been in touch with  Adjustment disorder with anxiety and depressed mood and agitation - Management of anxiety and aggressive behavior with Seroquel, Ativan and Trazodone at bedtime - unfortunately patient is intermittently refusing these  Opiate-induced constipation -Continue laxitives  Severe protein calorie malnutrition - Continue supplements  Anemia in neoplastic disease  Hypothyroidism -Apparently in order to consider radioactive iodine treatment, TSH needs to be greater than 30 and therefore Synthroid was being held - will resume now  Consultations:  Radiation and medical Oncology, palliative, psychiatry  Discharge Exam: Filed Weights   04/10/15 0839  Weight: 59.24 kg (130 lb 9.6 oz)   Filed Vitals:   04/23/15 0632  BP: 127/68  Pulse: 84  Temp: 98.4 F (36.9 C)  Resp: 16    General: AAO x 3, no distress Cardiovascular: RRR, no murmurs  Respiratory: clear to auscultation bilaterally GI: soft, non-tender, non-distended, bowel sound positive  Discharge Instructions You were cared for by a hospitalist during your hospital stay. If you have any questions about your discharge medications or the care you received while you were in the hospital after you are discharged, you can call the unit and asked to speak with the hospitalist on call if the hospitalist that took care of you is not available. Once  you are discharged, your primary care physician will handle any further medical issues. Please note that NO REFILLS for any  discharge medications will be authorized once you are discharged, as it is imperative that you return to your primary care physician (or establish a relationship with a primary care physician if you do not have one) for your aftercare needs so that they can reassess your need for medications and monitor your lab values.      Discharge Instructions    Discharge instructions    Complete by:  As directed   Regular diet     Increase activity slowly    Complete by:  As directed             Medication List    STOP taking these medications        furosemide 20 MG tablet  Commonly known as:  LASIX     HYDROmorphone 4 MG tablet  Commonly known as:  DILAUDID     ibuprofen 200 MG tablet  Commonly known as:  ADVIL,MOTRIN      TAKE these medications        bisacodyl 10 MG suppository  Commonly known as:  DULCOLAX  Place 1 suppository (10 mg total) rectally daily as needed for severe constipation.     chlorpheniramine-HYDROcodone 10-8 MG/5ML Suer  Commonly known as:  TUSSIONEX  Take 5 mLs by mouth every 12 (twelve) hours as needed for cough.     dexamethasone 4 MG tablet  Commonly known as:  DECADRON  Take 1 tablet (4 mg total) by mouth 4 (four) times daily.     docusate sodium 100 MG capsule  Commonly known as:  COLACE  Take 1 capsule (100 mg total) by mouth 2 (two) times daily as needed for mild constipation.     famotidine 20 MG tablet  Commonly known as:  PEPCID  Take 1 tablet (20 mg total) by mouth 2 (two) times daily.     fentaNYL 75 MCG/HR  Commonly known as:  DURAGESIC - dosed mcg/hr  Place 1 patch (75 mcg total) onto the skin every 3 (three) days.     levothyroxine 125 MCG tablet  Commonly known as:  SYNTHROID, LEVOTHROID  Take 1 tablet (125 mcg total) by mouth daily before breakfast.     LORazepam 1 MG tablet  Commonly known as:  ATIVAN  Take 1 tablet (1 mg total) by mouth every 8 (eight) hours as needed for anxiety.     morphine 60 MG 12 hr tablet  Commonly  known as:  MS CONTIN  Take 1 tablet (60 mg total) by mouth every 12 (twelve) hours.     morphine 15 MG tablet  Commonly known as:  MSIR  Take 1 tablet (15 mg total) by mouth every 4 (four) hours as needed for severe pain.     pantoprazole 40 MG tablet  Commonly known as:  PROTONIX  Take 1 tablet (40 mg total) by mouth daily.     QUEtiapine 50 MG Tb24 24 hr tablet  Commonly known as:  SEROQUEL XR  Take 2 tablets (100 mg total) by mouth daily.     senna-docusate 8.6-50 MG per tablet  Commonly known as:  Senokot-S  Take 2 tablets by mouth at bedtime.     traZODone 100 MG tablet  Commonly known as:  DESYREL  Take 1 tablet (100 mg total) by mouth at bedtime.       Allergies  Allergen Reactions  . Penicillins Anaphylaxis, Hives,  Itching and Other (See Comments)    Pt reports he does not believe this is true, states he does not recall ever being allergic to penicillin   . Latex Hives and Itching  . Lyrica [Pregabalin] Swelling      The results of significant diagnostics from this hospitalization (including imaging, microbiology, ancillary and laboratory) are listed below for reference.    Significant Diagnostic Studies: Ct Head Wo Contrast  04/15/2015   CLINICAL DATA:  Status post fall out of bed. Hit left temporal region, with laceration. Combative. Metastatic thyroid cancer. Initial encounter.  EXAM: CT HEAD WITHOUT CONTRAST  CT CERVICAL SPINE WITHOUT CONTRAST  TECHNIQUE: Multidetector CT imaging of the head and cervical spine was performed following the standard protocol without intravenous contrast. Multiplanar CT image reconstructions of the cervical spine were also generated.  COMPARISON:  MRI of the brain performed 04/13/2015, and CT of the head performed 02/06/2014. CT of the cervical spine performed 07/26/2013  FINDINGS: CT HEAD FINDINGS  A 1.6 x 1.4 cm high attenuation mass is again noted at the high left parietal lobe, with diffusely decreased white matter attenuation  involving much of the left parietal and frontal lobes. A complex mass is again noted at the fourth ventricle, measuring 3.0 x 1.7 cm. There is minimal rightward midline shift, measuring 3-4 mm.  Prominence of the ventricles and sulci reflects mild cortical volume loss. Periventricular white matter change likely reflects small vessel ischemic microangiopathy.  There is no evidence of fracture; visualized osseous structures are unremarkable in appearance. The orbits are within normal limits. There is opacification of the right frontal sinus. Mild mucosal thickening is noted at the left maxillary sinus. The remaining paranasal sinuses and mastoid air cells are well-aerated. No significant soft tissue abnormalities are seen.  CT CERVICAL SPINE FINDINGS  Since the prior study in 2014, there has been further collapse of the pathologic fractures of C3 and C4, with approximately 4 mm of retropulsion noted at C3, and narrowing of the spinal canal to 6-7 mm in AP dimension on sagittal images. There is corresponding mild grade 1 retrolisthesis of C2 on C3. An apparent chronic lesion is noted at the inferior endplate of T1, grossly unchanged in appearance. Prevertebral soft tissues are within normal limits.  The thyroid gland is not well seen. The visualized lung apices are clear. No significant soft tissue abnormalities are seen.  IMPRESSION: 1. No evidence of traumatic intracranial injury or fracture. 2. Further collapse of the pathologic fractures of C3 and C4 since 2014, with approximately 4 mm of the retropulsion noted at C3, and narrowing of the spinal canal to 6-7 mm in AP dimension on sagittal images. Corresponding mild grade 1 retrolisthesis of C2 on C3. 3. Complex mass again noted at the fourth ventricle, measuring 3.0 x 1.7 cm. 4. 1.6 x 1.4 cm high attenuation mass again noted at the high left parietal lobe, with diffusely decreased white matter attenuation involving much of the left parietal and frontal lobes.  Minimal associated rightward midline shift, measuring 3-4 mm. 5. Apparent chronic lesion again noted at the inferior endplate of C1, stable from 2014. 6. Mild cortical volume loss and scattered small vessel ischemic microangiopathy. 7. Opacification of the right frontal sinus. Mild mucosal thickening at the left maxillary sinus. These results were called by telephone at the time of interpretation on 04/15/2015 at 4:42 am to [facility] on [place], who verbally acknowledged these results.   Electronically Signed   By: Francoise Schaumann.D.  On: 04/15/2015 04:42   Ct Chest Wo Contrast  04/04/2015   CLINICAL DATA:  History of metastatic thyroid cancer  EXAM: CT CHEST WITHOUT CONTRAST  TECHNIQUE: Multidetector CT imaging of the chest was performed following the standard protocol without IV contrast.  COMPARISON:  12/24/2014  FINDINGS: The lungs are well aerated bilaterally. The moderate right-sided pleural effusion is again identified stable from the previous exam. No significant left pleural effusion is seen. There are multiple too-numerous-to-count pulmonary mass is identified bilaterally. Largest of these on the left measures approximately 3.4 cm and is stable in size from the prior exam. Conglomeration of nodules is noted centrally. No significant hilar or mediastinal adenopathy is noted.  The bony structures show multiple areas of metastatic disease. An expansile lytic lesion is noted within the scapula on the right laterally just below the glenoid and extending into the inferior aspect of the glenoid. Additionally an expansile lesion is noted within the right anterior chest wall. It measures approximately 5.8 cm in greatest dimension and is slightly enlarged from the prior exam. Multiple spinal lesions are seen. The areas of worst lytic destruction are noted in the left vertebral body at T10 as well as the left half of the vertebral body at T12 and within the pedicle and lamina and extending laterally from the L1.  The changes at L1 show encroachment and destruction of the posterior wall of the spinal canal although no definitive soft tissue component is seen within the canal. The lack of IV contrast somewhat limits evaluation. The remainder the upper abdomen is within normal limits with the exception of metastatic disease of the left adrenal gland. The adrenal nodule measures 2.7 cm increased from 1.9 cm on the prior exam.  IMPRESSION: Changes consistent with the given clinical history of metastatic thyroid cancer to the lungs and bones as well as the left adrenal gland. There is been some slight increase in the degree of bony abnormalities. The lung abnormalities are roughly stable from the prior exam.  Stable right-sided effusion.   Electronically Signed   By: Inez Catalina M.D.   On: 04/04/2015 14:16   Ct Cervical Spine Wo Contrast  04/15/2015   CLINICAL DATA:  Status post fall out of bed. Hit left temporal region, with laceration. Combative. Metastatic thyroid cancer. Initial encounter.  EXAM: CT HEAD WITHOUT CONTRAST  CT CERVICAL SPINE WITHOUT CONTRAST  TECHNIQUE: Multidetector CT imaging of the head and cervical spine was performed following the standard protocol without intravenous contrast. Multiplanar CT image reconstructions of the cervical spine were also generated.  COMPARISON:  MRI of the brain performed 04/13/2015, and CT of the head performed 02/06/2014. CT of the cervical spine performed 07/26/2013  FINDINGS: CT HEAD FINDINGS  A 1.6 x 1.4 cm high attenuation mass is again noted at the high left parietal lobe, with diffusely decreased white matter attenuation involving much of the left parietal and frontal lobes. A complex mass is again noted at the fourth ventricle, measuring 3.0 x 1.7 cm. There is minimal rightward midline shift, measuring 3-4 mm.  Prominence of the ventricles and sulci reflects mild cortical volume loss. Periventricular white matter change likely reflects small vessel ischemic  microangiopathy.  There is no evidence of fracture; visualized osseous structures are unremarkable in appearance. The orbits are within normal limits. There is opacification of the right frontal sinus. Mild mucosal thickening is noted at the left maxillary sinus. The remaining paranasal sinuses and mastoid air cells are well-aerated. No significant soft tissue abnormalities are  seen.  CT CERVICAL SPINE FINDINGS  Since the prior study in 2014, there has been further collapse of the pathologic fractures of C3 and C4, with approximately 4 mm of retropulsion noted at C3, and narrowing of the spinal canal to 6-7 mm in AP dimension on sagittal images. There is corresponding mild grade 1 retrolisthesis of C2 on C3. An apparent chronic lesion is noted at the inferior endplate of T1, grossly unchanged in appearance. Prevertebral soft tissues are within normal limits.  The thyroid gland is not well seen. The visualized lung apices are clear. No significant soft tissue abnormalities are seen.  IMPRESSION: 1. No evidence of traumatic intracranial injury or fracture. 2. Further collapse of the pathologic fractures of C3 and C4 since 2014, with approximately 4 mm of the retropulsion noted at C3, and narrowing of the spinal canal to 6-7 mm in AP dimension on sagittal images. Corresponding mild grade 1 retrolisthesis of C2 on C3. 3. Complex mass again noted at the fourth ventricle, measuring 3.0 x 1.7 cm. 4. 1.6 x 1.4 cm high attenuation mass again noted at the high left parietal lobe, with diffusely decreased white matter attenuation involving much of the left parietal and frontal lobes. Minimal associated rightward midline shift, measuring 3-4 mm. 5. Apparent chronic lesion again noted at the inferior endplate of C1, stable from 2014. 6. Mild cortical volume loss and scattered small vessel ischemic microangiopathy. 7. Opacification of the right frontal sinus. Mild mucosal thickening at the left maxillary sinus. These results were  called by telephone at the time of interpretation on 04/15/2015 at 4:42 am to [facility] on [place], who verbally acknowledged these results.   Electronically Signed   By: Garald Balding M.D.   On: 04/15/2015 04:42   Mr Jeri Cos JF Contrast  04/13/2015   CLINICAL DATA:  Diplopia. Metastatic thyroid cancer. Osseous metastases.  EXAM: MRI HEAD WITHOUT AND WITH CONTRAST  TECHNIQUE: Multiplanar, multiecho pulse sequences of the brain and surrounding structures were obtained without and with intravenous contrast.  CONTRAST:  70mL MULTIHANCE GADOBENATE DIMEGLUMINE 529 MG/ML IV SOLN  COMPARISON:  MRI brain without and with contrast 05/09/2014.  FINDINGS: The a heterogeneous enhancing lesion centered in the fourth ventricle demonstrates marked interval enlargement acute and chronic blood products are evident within this lesion. There is significant increase in surrounding vasogenic edema. T2 changes extending to the cerebellar peduncle is bilaterally as well as the inferior colliculus on the right.  Although the lesion has enlarged and demonstrates intrinsic blood products, there is no obstructing hydrocephalus. Mild ventricular enlargement is stable, compatible with the degree of atrophy.  A 7 mm enhancing lesion along the right choroid plexus any punctate lesion within the right central sulcus are stable.  A new enhancing lesion within the anterior left parietal lobe measures 1.4 x 1.9 x 1.6 cm. There is significant surrounding edema and mass effect with partial effacement of the left lateral ventricle. Midline shift measures 5 mm at the foramen of [place].  No other enhancing parenchymal lesions are evident. Pathologic fracture of the C3 vertebral body is again noted with posterior extension of tumor and bone resulting and significant central canal stenosis at the C3 level. Chronic collapse of the C4 vertebral body is noted as well.  No acute infarct is present. Flow is present in the major intracranial arteries. Chronic  opacification of the right frontal sinus is noted. A polyp or mucous retention cyst is again noted within the left maxillary sinus. The remaining paranasal sinuses  are clear. Increased fluid is present in the left mastoid air cells. No obstructing nasopharyngeal lesion is present.  IMPRESSION: 1. Increased size of heterogeneous enhancing mass lesion within the fourth ventricle, now measuring 3.0 x 2.2 x 2.2 cm. The lesion demonstrates acute and chronic intrinsic blood products. 2. New enhancing metastatic lesion in the anterior left parietal lobe with significant surrounding vasogenic edema and mass effect. Midline shift is 5 mm. Intrinsic blood products are noted. 3. Stable focal enhancing lesions along the right choroid plexus and within the right central sulcus. 4. Moderate atrophy and diffuse white matter disease is otherwise stable. 5. Chronic right frontal sinus disease. 6. Left mastoid effusion. No obstructing nasopharyngeal lesion is evident.   Electronically Signed   By: San Morelle M.D.   On: 04/13/2015 12:34   Dg Chest Port 1 View  04/01/2015   CLINICAL DATA:  Left clavicle and shoulder pain. Thyroid cancer with metastatic bone disease.  EXAM: PORTABLE CHEST - 1 VIEW  COMPARISON:  CT chest 12/24/2014.  FINDINGS: The heart size is normal. Multiple bilateral pulmonary nodules are present bilaterally compatible with known metastatic disease. There has been some progression since the prior study. No focal airspace consolidation is evident otherwise.  Rightward curvature is present in the lower thoracic spine due to collapse pathologic left-sided fracture, unchanged. An erosive lesion is again noted in the right scapula. No definite new lesions are evident.  IMPRESSION: 1. Progression of numerous pulmonary nodules compatible with known metastatic disease. 2. Rightward curvature of the thoracic spine is secondary to asymmetric left-sided pathologic fractures, unchanged. 3. Stable erosive lesion in  the right scapula.   Electronically Signed   By: San Morelle M.D.   On: 04/01/2015 16:19    Microbiology: No results found for this or any previous visit (from the past 240 hour(s)).   Labs: Basic Metabolic Panel: No results for input(s): NA, K, CL, CO2, GLUCOSE, BUN, CREATININE, CALCIUM, MG, PHOS in the last 168 hours. Liver Function Tests: No results for input(s): AST, ALT, ALKPHOS, BILITOT, PROT, ALBUMIN in the last 168 hours. No results for input(s): LIPASE, AMYLASE in the last 168 hours. No results for input(s): AMMONIA in the last 168 hours. CBC: No results for input(s): WBC, NEUTROABS, HGB, HCT, MCV, PLT in the last 168 hours. Cardiac Enzymes: No results for input(s): CKTOTAL, CKMB, CKMBINDEX, TROPONINI in the last 168 hours. BNP: BNP (last 3 results) No results for input(s): BNP in the last 8760 hours.  ProBNP (last 3 results) No results for input(s): PROBNP in the last 8760 hours.  CBG: No results for input(s): GLUCAP in the last 168 hours.     SignedDebbe Odea, MD [facility] 04/23/2015, 11:07 AM

## 2015-04-22 NOTE — Progress Notes (Signed)
[place] [facility]  [place] [facility] following to assist inpt CSW with disposition planning. CSW will attempt to arrange family conference with oncologist, pt, [place] team and his family to further assist with d/c plan .  Dr. Jana Hakim open to meeting with pt, family and CSW team to further discuss this plan as needed. He reports he could meet at 8/830 or 5pm on Thursday or Friday. CSW to follow along with inpt CSW.   Loren Racer, [place] Worker [place]  [place] Phone: [phone] Fax: [phone]

## 2015-04-23 DIAGNOSIS — F4323 Adjustment disorder with mixed anxiety and depressed mood: Secondary | ICD-10-CM

## 2015-04-23 MED ORDER — FAMOTIDINE 20 MG PO TABS: 20.0000 mg | ORAL_TABLET | Freq: Two times a day (BID) | ORAL | Status: AC

## 2015-04-23 MED ORDER — TRAZODONE HCL 100 MG PO TABS: 100.0000 mg | ORAL_TABLET | Freq: Every day | ORAL | Status: AC

## 2015-04-23 MED ORDER — TRAZODONE HCL 50 MG PO TABS: 100.0000 mg | ORAL_TABLET | Freq: Every day | ORAL | Status: DC

## 2015-04-23 MED ORDER — DEXAMETHASONE 4 MG PO TABS: 4.0000 mg | ORAL_TABLET | Freq: Four times a day (QID) | ORAL | Status: AC

## 2015-04-23 MED ORDER — SENNOSIDES-DOCUSATE SODIUM 8.6-50 MG PO TABS
2.0000 | ORAL_TABLET | Freq: Every day | ORAL | Status: AC
Start: 2015-04-23 — End: ?

## 2015-04-23 MED ORDER — PANTOPRAZOLE SODIUM 40 MG PO TBEC: 40.0000 mg | DELAYED_RELEASE_TABLET | Freq: Every day | ORAL | Status: AC

## 2015-04-23 NOTE — Progress Notes (Signed)
Pt for discharge to [facility].   CSW facilitated pt discharge needs including contacting facility, faxing pt discharge information via [place], discussing with pt at bedside and notifying pt brother via telephone, providing RN phone number to call report, and arranging ambulance transport via [place].  CSW provided progress note from psychiatrist and pt advanced directives in pt discharge packet.   No further social work needs identified at this time.  CSW signing off.   Alison Murray, MSW, [place] Work [phone]

## 2015-04-23 NOTE — Progress Notes (Signed)
COURTESY NOTE:  Will be glad to meet with family when they are available. Please note we had a meeting 9/1 when family was supposed to come and they did not show. Also note that in the 2 years we have had Frank Ward as a patient I have not seen any family member or been contacted by one. This suggests family will not be capable of giving Frank Ward the 24/7 coverage he would need if he went home. Accordingly I think the best option for him is SNF.   Please let me know if I can be of further help at this point.

## 2015-04-23 NOTE — Clinical Social Work Placement (Signed)
   CLINICAL SOCIAL WORK PLACEMENT  NOTE  Date:  04/23/2015  Patient Details  Name: Frank Ward MRN: [id number] Date of Birth: Jun 21, 1971  Clinical Social Work is seeking post-discharge placement for this patient at the [place] level of care (*CSW will initial, date and re-position this form in  chart as items are completed):  Yes   Patient/family provided with [place] Work Department's list of facilities offering this level of care within the geographic area requested by the patient (or if unable, by the patient's family).  Yes   Patient/family informed of their freedom to choose among providers that offer the needed level of care, that participate in Medicare, Medicaid or managed care program needed by the patient, have an available bed and are willing to accept the patient.  Yes   Patient/family informed of [facility]'s ownership interest in [facility] and [facility], as well as of the fact that they are under no obligation to receive care at these facilities.  PASRR submitted to EDS on 04/14/15     PASRR number received on 04/15/15     Existing PASRR number confirmed on       FL2 transmitted to all facilities in geographic area requested by pt/family on 04/14/15     FL2 transmitted to all facilities within larger geographic area on       Patient informed that his/her managed care company has contracts with or will negotiate with certain facilities, including the following:        Yes   Patient/family informed of bed offers received.  Patient chooses bed at [facility]     Physician recommends and patient chooses bed at      Patient to be transferred to [facility] on 04/23/15.  Patient to be transferred to facility by ambulance Corey Harold)     Patient family notified on 04/23/15 of transfer.  Name of family member notified:  pt notified at bedside and pt brother, [name] notified via telephone      PHYSICIAN Please sign FL2     Additional Comment:    _______________________________________________ Ladell Pier, LCSW 04/23/2015, 1:53 PM  [phone]

## 2015-04-23 NOTE — Evaluation (Addendum)
Physical Therapy Re-Evaluation Patient Details Name: Frank Ward MRN: [id number] DOB: Dec 03, 1970 Today's Date: 04/23/2015   History of Present Illness  44 y.o. 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   Clinical Impression  On eval, pt required Min assist +2 for mobility-stand pivot x 2, pre-gait forwards and backwards steps with RW. Standing exercises with RW: marching, heel raises, mini squats x 10 each-Min assist to stabilize. Pt is at high risk for falls. Recommend SNF at this time.    Follow Up Recommendations SNF;Supervision/Assistance - 24 hour    Equipment Recommendations  Rolling walker with 5" wheels    Recommendations for Other Services OT consult     Precautions / Restrictions Precautions Precautions: Fall Restrictions Weight Bearing Restrictions: No      Mobility  Bed Mobility Overal bed mobility: Needs Assistance Bed Mobility: Supine to Sit;Sit to Supine     Supine to sit: Min guard Sit to supine: Min guard   General bed mobility comments: close guard for safety. Increased time.  Transfers Overall transfer level: Needs assistance Equipment used: Rolling walker (2 wheeled);2 person hand held assist   Sit to Stand: Min assist;+2 safety/equipment Stand pivot transfers: Min assist;+2 safety/equipment       General transfer comment: Assist to rise, stabilize, control descent. Stand pivot x2, bed<>bsc. Stand x1 with RW.   Ambulation/Gait Ambulation/Gait assistance: Min assist;+2 safety/equipment Ambulation Distance (Feet): 4 Feet Assistive device: Rolling walker (2 wheeled) Gait Pattern/deviations: Trunk flexed     General Gait Details: 4 feet forwards then backwards x 2 with RW. Very unsteady. High fall risk. Pt falling asleep  while standing so deferred any further activity for safety reasons.  Stairs            Wheelchair Mobility    Modified Rankin (Stroke Patients Only)       Balance           Standing balance support: Bilateral upper extremity supported;During functional activity Standing balance-Leahy Scale: Fair                               Pertinent Vitals/Pain Pain Assessment: 0-10 Pain Score: 5  Pain Location: abdomen Pain Descriptors / Indicators: Aching;Sore Pain Intervention(s): Monitored during session;Repositioned    Home Living Family/patient expects to be discharged to:: Private residence Living Arrangements: Alone Available Help at Discharge: Family;Available PRN/intermittently Type of Home: Apartment Home Access: Level entry     Home Layout: One level Home Equipment: Cane - single point      Prior Function Level of Independence: Independent with assistive device(s)         Comments: used cane     Hand Dominance        Extremity/Trunk Assessment   Upper Extremity Assessment: Generalized weakness           Lower Extremity Assessment: Generalized weakness      Cervical / Trunk Assessment: Kyphotic  Communication   Communication: No difficulties  Cognition Arousal/Alertness: Awake/alert Behavior During Therapy: Flat affect Overall Cognitive Status:  (impaired insight) Area of Impairment: Safety/judgement         Safety/Judgement: Decreased awareness of safety;Decreased awareness of deficits          General Comments      Exercises  Assessment/Plan    PT Assessment Patient needs continued PT services  PT Diagnosis Difficulty walking;Generalized weakness;Acute pain   PT Problem List Decreased strength;Decreased activity tolerance;Decreased balance;Decreased mobility;Decreased knowledge of use of DME;Pain;Decreased safety awareness  PT Treatment Interventions DME instruction;Gait training;Functional mobility  training;Therapeutic activities;Patient/family education;Balance training;Therapeutic exercise   PT Goals (Current goals can be found in the Care Plan section) Acute Rehab PT Goals Patient Stated Goal: go home PT Goal Formulation: With patient Time For Goal Achievement: 05/07/15 Potential to Achieve Goals: Fair    Frequency Min 3X/week   Barriers to discharge        Co-evaluation               End of Session Equipment Utilized During Treatment: Gait belt Activity Tolerance: Patient limited by fatigue Patient left: in bed;with call bell/phone within reach;with bed alarm set           Time: 3112-1624 PT Time Calculation (min) (ACUTE ONLY): 46 min   Charges:   PT Evaluation $Initial PT Evaluation Tier I: 1 Procedure PT Treatments $Therapeutic Activity: 23-37 mins   PT G Codes:        Weston Anna, MPT Pager: [phone]

## 2015-04-23 NOTE — Progress Notes (Signed)
CSW continuing to follow.   CSW received notification from [facility] that facility could not offer pt a bed.   CSW sent updated clinicals to [facility] and [facility]. CSW spoke to [employer] RN liaison came to hospital and CSW updated and Herbalist liaison spoke with Therapist, sports as well. [place] notified CSW that facility is able to offer pt a bed and bed available today.   CSW met with pt at bedside to notify pt of bed availability. Pt agreeable to go to [facility]. CSW explained to pt that [facility] cannot do IV pain medications and it was important for pt to take oral medications to manage his pain. Pt agreeable.   CSW contacted pt mother, Trudie Reed via telephone and updated pt mother regarding situation. CSW provided pt mother with the contact information to [facility]. Pt mother asked CSW to call pt brother as pt mother was not feeling well and states that she gets "confused" with all of the information.   CSW contacted pt brother, [name] via telephone. CSW updated pt brother and pt brother agreeable to [facility] and will update pt family members. CSW clarified pt brothers questions and concerns. Pt brother was encouraged to contact this CSW or [facility] CSW with any further questions or concerns.   CSW facilitated pt discharge needs this afternoon.  Alison Murray, MSW, [place] Work [phone]

## 2015-04-23 NOTE — Plan of Care (Signed)
Problem: Phase III Progression Outcomes Goal: Pain controlled on oral analgesia Outcome: Not Progressing Pt refuses oral pain medications, requests IV dilaudid

## 2015-04-24 ENCOUNTER — Emergency Department (HOSPITAL_COMMUNITY)
Admission: EM | Admit: 2015-04-24 | Discharge: 2015-04-24 | Disposition: A | Discharge status: Skilled Nursing Facility | Payer: Medicare Other | Attending: Emergency Medicine | Admitting: Emergency Medicine

## 2015-04-24 ENCOUNTER — Emergency Department (HOSPITAL_COMMUNITY): Payer: Medicare Other

## 2015-04-24 ENCOUNTER — Encounter (HOSPITAL_COMMUNITY): Payer: Self-pay | Admitting: Emergency Medicine

## 2015-04-24 DIAGNOSIS — Z8701 Personal history of pneumonia (recurrent): Secondary | ICD-10-CM | POA: Diagnosis not present

## 2015-04-24 DIAGNOSIS — Z79899 Other long term (current) drug therapy: Secondary | ICD-10-CM | POA: Diagnosis not present

## 2015-04-24 DIAGNOSIS — M542 Cervicalgia: Secondary | ICD-10-CM | POA: Insufficient documentation

## 2015-04-24 DIAGNOSIS — C799 Secondary malignant neoplasm of unspecified site: Secondary | ICD-10-CM

## 2015-04-24 DIAGNOSIS — E079 Disorder of thyroid, unspecified: Secondary | ICD-10-CM | POA: Insufficient documentation

## 2015-04-24 DIAGNOSIS — I2699 Other pulmonary embolism without acute cor pulmonale: Secondary | ICD-10-CM | POA: Diagnosis not present

## 2015-04-24 DIAGNOSIS — R000 Tachycardia, unspecified: Secondary | ICD-10-CM | POA: Diagnosis not present

## 2015-04-24 DIAGNOSIS — M545 Low back pain: Secondary | ICD-10-CM | POA: Insufficient documentation

## 2015-04-24 DIAGNOSIS — Z85830 Personal history of malignant neoplasm of bone: Secondary | ICD-10-CM | POA: Insufficient documentation

## 2015-04-24 DIAGNOSIS — Z880 Allergy status to penicillin: Secondary | ICD-10-CM | POA: Diagnosis not present

## 2015-04-24 DIAGNOSIS — I10 Essential (primary) hypertension: Secondary | ICD-10-CM | POA: Diagnosis not present

## 2015-04-24 DIAGNOSIS — R630 Anorexia: Secondary | ICD-10-CM | POA: Diagnosis not present

## 2015-04-24 DIAGNOSIS — R5383 Other fatigue: Secondary | ICD-10-CM | POA: Insufficient documentation

## 2015-04-24 DIAGNOSIS — Z91040 Latex allergy status: Secondary | ICD-10-CM | POA: Insufficient documentation

## 2015-04-24 DIAGNOSIS — Z85850 Personal history of malignant neoplasm of thyroid: Secondary | ICD-10-CM | POA: Diagnosis not present

## 2015-04-24 DIAGNOSIS — Z923 Personal history of irradiation: Secondary | ICD-10-CM | POA: Insufficient documentation

## 2015-04-24 DIAGNOSIS — Z87891 Personal history of nicotine dependence: Secondary | ICD-10-CM | POA: Diagnosis not present

## 2015-04-24 DIAGNOSIS — R52 Pain, unspecified: Secondary | ICD-10-CM | POA: Diagnosis present

## 2015-04-24 LAB — COMPREHENSIVE METABOLIC PANEL
ALT: 16 U/L — ABNORMAL LOW (ref 17–63)
ANION GAP: 8 (ref 5–15)
AST: 23 U/L (ref 15–41)
Albumin: 3.3 g/dL — ABNORMAL LOW (ref 3.5–5.0)
Alkaline Phosphatase: 101 U/L (ref 38–126)
BUN: 19 mg/dL (ref 6–20)
CALCIUM: 8.4 mg/dL — AB (ref 8.9–10.3)
CHLORIDE: 104 mmol/L (ref 101–111)
CO2: 26 mmol/L (ref 22–32)
Creatinine, Ser: 0.72 mg/dL (ref 0.61–1.24)
Glucose, Bld: 167 mg/dL — ABNORMAL HIGH (ref 65–99)
Potassium: 3.8 mmol/L (ref 3.5–5.1)
SODIUM: 138 mmol/L (ref 135–145)
Total Bilirubin: 0.3 mg/dL (ref 0.3–1.2)
Total Protein: 5.7 g/dL — ABNORMAL LOW (ref 6.5–8.1)

## 2015-04-24 LAB — TROPONIN I

## 2015-04-24 LAB — URINE MICROSCOPIC-ADD ON

## 2015-04-24 LAB — CBC WITH DIFFERENTIAL/PLATELET
Basophils Absolute: 0 10*3/uL (ref 0.0–0.1)
Basophils Relative: 0 % (ref 0–1)
EOS ABS: 0 10*3/uL (ref 0.0–0.7)
EOS PCT: 0 % (ref 0–5)
HCT: 31.4 % — ABNORMAL LOW (ref 39.0–52.0)
Hemoglobin: 10 g/dL — ABNORMAL LOW (ref 13.0–17.0)
LYMPHS ABS: 0.6 10*3/uL — AB (ref 0.7–4.0)
Lymphocytes Relative: 5 % — ABNORMAL LOW (ref 12–46)
MCH: 29.2 pg (ref 26.0–34.0)
MCHC: 31.8 g/dL (ref 30.0–36.0)
MCV: 91.5 fL (ref 78.0–100.0)
MONO ABS: 0.6 10*3/uL (ref 0.1–1.0)
MONOS PCT: 4 % (ref 3–12)
Neutro Abs: 11.7 10*3/uL — ABNORMAL HIGH (ref 1.7–7.7)
Neutrophils Relative %: 91 % — ABNORMAL HIGH (ref 43–77)
PLATELETS: 350 10*3/uL (ref 150–400)
RBC: 3.43 MIL/uL — ABNORMAL LOW (ref 4.22–5.81)
RDW: 17 % — ABNORMAL HIGH (ref 11.5–15.5)
WBC: 12.9 10*3/uL — AB (ref 4.0–10.5)

## 2015-04-24 LAB — URINALYSIS, ROUTINE W REFLEX MICROSCOPIC
BILIRUBIN URINE: NEGATIVE
Glucose, UA: >1000 mg/dL — AB
Hgb urine dipstick: NEGATIVE
Ketones, ur: NEGATIVE mg/dL
Leukocytes, UA: NEGATIVE
NITRITE: NEGATIVE
PROTEIN: NEGATIVE mg/dL
SPECIFIC GRAVITY, URINE: 1.031 — AB (ref 1.005–1.030)
UROBILINOGEN UA: 0.2 mg/dL (ref 0.0–1.0)
pH: 5.5 (ref 5.0–8.0)

## 2015-04-24 MED ORDER — MORPHINE SULFATE 15 MG PO TABS
15.0000 mg | ORAL_TABLET | ORAL | Status: DC | PRN
  Administered 2015-04-24 (×2): 15 mg via ORAL
  Filled 2015-04-24 (×2): qty 1

## 2015-04-24 MED ORDER — SODIUM CHLORIDE 0.9 % IV BOLUS (SEPSIS)
1000.0000 mL | Freq: Once | INTRAVENOUS | Status: AC
  Administered 2015-04-24: 1000 mL via INTRAVENOUS

## 2015-04-24 MED ORDER — IOHEXOL 350 MG/ML SOLN
100.0000 mL | Freq: Once | INTRAVENOUS | Status: AC | PRN
  Administered 2015-04-24: 100 mL via INTRAVENOUS

## 2015-04-24 MED ORDER — SODIUM CHLORIDE 0.9 % IV BOLUS (SEPSIS): 1000.0000 mL | Freq: Once | INTRAVENOUS | Status: DC
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
## 2015-04-24 NOTE — ED Notes (Signed)
Patient yelling out for assistance constantly. EDNT went into room and patient was cursing. Security notified and spoke with patient.

## 2015-04-24 NOTE — ED Notes (Signed)
Patient transported to X-ray 

## 2015-04-24 NOTE — Progress Notes (Addendum)
CSW spoke with pt brother by phone. Pt brother aware of new PE. Per brother, pt is still in denial and believes his prognosis is 2/3 years not less than a month. Per brother, pt has become agitated and upset.    [name], [place] Work  [employer] [phone]

## 2015-04-24 NOTE — ED Notes (Signed)
Po fluids given. Patient given instructions to drink since IV fluids are not going to be given.

## 2015-04-24 NOTE — Discharge Instructions (Signed)
Metastatic Cancer, Questions and Answers As we discussed, blood thinners are not recommended due to your metastatic cancer. Return to the ED if you develop or worsening symptoms. KEY POINTS  Cancer happens when cells become abnormal and grow without control.  Where the cancer started is called the primary cancer or the primary tumor.  Metastatic cancer happens when cancer cells spread from the place where it started to other parts of the body.  When cancer spreads, the metastatic cancer keeps the same type of cells and the same name as the primary tumor.  The most common sites of metastasis are the lungs, bones, liver, and brain.  Treatment for metastatic cancer usually depends on the type of cancer. It also depends on the size and location of the metastasis. WHAT IS CANCER?   Cancer is a group of many related diseases. All cancers begin in cells. Cells are the building blocks that make up tissues. Cancer that arises from organs and solid tissues is called a solid tumor. Cancer that begins in blood cells is called leukemia, multiple myeloma, or lymphoma.  Normally, cells grow and divide to form new cells as the body needs them. When cells grow old and die, new cells take their place. Sometimes this orderly process goes wrong. New cells form when the body does not need them. Old cells do not die when they should.  The extra cells form a mass of tissue. This is called a growth or tumor. Tumors can be either not cancerous (benign) or cancerous (malignant). Benign tumors do not spread to other parts of the body. They are rarely a threat to life. Malignant tumors can spread (metastasize) and may be life threatening. WHAT IS PRIMARY CANCER?  Cancer can begin in any organ or tissue of the body. The original tumor is called the primary cancer or primary tumor. It is usually named for the part of the body or the type of cell in which it begins. WHAT IS METASTASIS, AND HOW DOES IT HAPPEN?    Metastasis means the spread of cancer. Cancer cells can break away from a primary tumor and enter the bloodstream or lymphatic system. This is the system that produces, stores, and carries the cells that fight infections. That is how cancer cells spread to other parts of the body.  When cancer cells spread and form a new tumor in a different organ, the new tumor is a metastatic tumor. The cells in the metastatic tumor come from the original tumor. For example, if breast cancer spreads to the lungs, the metastatic tumor in the lung is made up of cancerous breast cells. It is not made of lung cells. In this case, the disease in the lungs is metastatic breast cancer (not lung cancer). Under a microscope, metastatic breast cancer cells generally look the same as the cancer cells in the breast. [place]?   Cancer cells can spread to almost any part of the body. Cancer cells frequently spread to lymph nodes (rounded masses of lymphatic tissue) near the primary tumor (regional lymph nodes). This is called lymph node involvement or regional disease. Cancer that spreads to other organs or to lymph nodes far from the primary tumor is called metastatic disease. Caregivers sometimes also call this distant disease.  The most common sites of metastasis from solid tumors are the lungs, bones, liver, and brain. Some cancers tend to spread to certain parts of the body. For example, lung cancer often metastasizes to the brain or bones. Colon  cancer often spreads to the liver. Prostate cancer tends to spread to the bones. Breast cancer commonly spreads to the bones, lungs, liver, or brain. But each of these cancers can spread to other parts of the body as well.  Because blood cells travel throughout the body, leukemia, multiple myeloma, and lymphoma cells are usually not localized when the cancer is diagnosed. Tumor cells may be found in the blood, several lymph nodes, or other parts of the body such as  the liver or bones. This type of spread is not referred to as metastasis. ARE THERE SYMPTOMS OF METASTATIC CANCER?   Some people with metastatic cancer do not have symptoms. Their metastases are found by X-rays and other tests performed for other reasons.  When symptoms of metastatic cancer occur, the type and frequency of the symptoms will depend on the size and location of the metastasis. For example, cancer that spreads to the bones is likely to cause pain and can lead to bone fractures. Cancer that spreads to the brain can cause a variety of symptoms. These include headaches, seizures, and unsteadiness. Shortness of breath may be a sign of lung involvement. Abdominal swelling or yellowing of the skin (jaundice) can indicate that cancer has spread to the liver.  Sometimes a person's primary cancer is discovered only after the metastatic tumor causes symptoms. For example, a man whose prostate cancer has spread to the bones in his pelvis may have lower back pain (caused by the cancer in his bones) before he experiences any symptoms from the primary tumor in his prostate. HOW DOES THE CAREGIVER KNOW WHETHER A CANCER IS PRIMARY OR A METASTATIC TUMOR?  To determine whether a tumor is primary or metastatic, the tumor will be examined under a microscope. In general, cancer cells look like abnormal versions of cells in the tissue where the cancer began. Using specialized diagnostic tests, a trained person is often able to tell where the cancer cells came from. Markers or antigens found in or on the cancer cells can indicate the primary site of the cancer.  Metastatic cancers may be found before or at the same time as the primary tumor, or months or years later. When a new tumor is found in a patient who has been treated for cancer in the past, it is more often a metastasis than another primary tumor. IS IT POSSIBLE TO HAVE A METASTATIC TUMOR WITHOUT HAVING A PRIMARY CANCER?  No. A metastatic tumor always  starts from cancer cells in another part of the body. In most cases, when a metastatic tumor is found first, the primary tumor can be found. The search for the primary tumor may involve lab tests, X-rays, and other procedures. However, in a small number of cases, a metastatic tumor is diagnosed but the primary tumor cannot be found, in spite of extensive tests. The tumor is metastatic because the cells are not like those in the organ or tissue in which the tumor is found. The primary tumor is called unknown or hidden (occult). The patient is said to have cancer of unknown primary origin (CUP). Because diagnostic techniques are constantly improving, the number of cases of CUP is going down.  WHAT TREATMENTS ARE USED FOR METASTATIC CANCER?   When cancer has metastasized, it may be treated with:  Chemotherapy.  Radiation therapy.  Biological therapy.  Hormone therapy.  Surgery.  Cryosurgery.  A combination of these.  The choice of treatment generally depends on the:  Type of primary cancer.  Size  and location of the metastasis.  Patient's age and general health.  Types of treatments the patient has had in the past. In patients with CUP, it is possible to treat the disease even though the primary tumor has not been located. The goal of treatment may be to control the cancer, or to relieve symptoms or side effects of treatment. ARE NEW TREATMENTS FOR METASTATIC CANCER BEING DEVELOPED?  Yes, many new cancer treatments are under study. To develop new treatments, the [employer] sponsors clinical trials (research studies) with cancer patients in many hospitals, universities, medical schools, and cancer centers around the country. Clinical trials are a critical step in the improvement of treatment. Before any new treatment can be recommended for general use, doctors conduct studies to find out whether the treatment is both safe for patients and effective against the disease. The results of such studies  have led to progress not only in the treatment of cancer, but in the detection, diagnosis, and prevention of the disease as well. Patients interested in taking part in a clinical trial should talk with their caregivers. [place] ([place]): www.cancer.gov Document Released: 12/06/2004 Document Revised: 10/24/2011 Document Reviewed: 07/24/2008 [employer] Patient Information 2015 [place], [place]. This information is not intended to replace advice given to you by your health care provider. Make sure you discuss any questions you have with your health care provider.

## 2015-04-24 NOTE — ED Notes (Signed)
Bed: WA16 Expected date:  Expected time:  Means of arrival:  Comments: EMS 

## 2015-04-24 NOTE — ED Notes (Signed)
Patient states he is here for pain. When asked where his pain is patient states his hip. Patient then states he is having pain in his spine. Patient then states he is having pain in his arm. Patient states he called for someone and noone would come help him. Patient states he does not want to be here, he wants to go back to his room (upstairs). Patient was advised he is not up for admission. Patient states well then send me home. Patient was advised that if he is discharged "home" he will be going back to the facility he came from. Patient states "no, take me to my room". Patient states he is hurting and we aren't doing anything.

## 2015-04-24 NOTE — ED Notes (Signed)
Per EMS, patient was recently admitted to [facility] (@ 12 hours earlier). Patient repeatedly calling 911 complaining about the facility. [employer] arrived to speak to patient in reference 911 abuse. Patient stated to [employer] he wanted to come to [facility], did not want to be at facility. EMS was called, patient oriented to self and place, disoriented to time. Patient has thyroid cancer. Initially patient agreed to transport, once in ambulance patient began asking to be taken to his mothers home or his friends house, that he did not want to come to hospital, when patient was told that they could not take him to any location besides the hospital he asked to be brought to [facility]. EMS states patient acknowledges that he was just here, but that coming back here was better than going back to the facility. Patient with limited mobility, uses wheelchair.

## 2015-04-24 NOTE — ED Notes (Signed)
Patient on the phone yelling and screaming , threatening to kill them and cursing frequently.

## 2015-04-24 NOTE — ED Notes (Addendum)
Social worker and charge nurse in to talk with patient about returning to [facility]

## 2015-04-24 NOTE — Clinical Social Work Note (Signed)
Clinical Social Work Assessment  Patient Details  Name: Frank Ward MRN: [id number] Date of Birth: 04/12/1971  Date of referral:  04/24/15               Reason for consult:  Facility Placement                Permission sought to share information with:  Facility Contact Representative, Family Supports Permission granted to share information::     Name::     Mary Ross [phone], Raymond Berryman   Agency::  [facility]  Relationship::  Mother, brother  Contact Information:  [phone]  Housing/Transportation Living arrangements for the past 2 months:  Apartment, Skilled Nursing Facility Source of Information:  Patient Patient Interpreter Needed:  None Criminal Activity/Legal Involvement Pertinent to Current Situation/Hospitalization:    Significant Relationships:  Parents, Siblings Lives with:  Self Do you feel safe going back to the place where you live?    Need for family participation in patient care:  No (Coment)  Care giving concerns:  Patient discharged yesterday to [facility],  And unhappy with the facility. Pt continued to call 911 to return to the hospital.    Social Worker assessment / plan:  CSW met with patient at bedside to discuss situation and complete psychosocial assessment. Pt shared that he didn't want to go back to [facility] and didn't like the facility. Patient states he felt threatened. Per chart review, pt has metastic cancer and does not have capacity at this time. Pt asked csw to call pt mother and brother. CSW unable to leave message with pt mother. CSW spoke with pt brother Raymond Cousin. Pt brother states that patient will have to go to [facility] and is un abel to make his own decisions. Pt brother shared that patietn family is unabel to care for patient at this time due to his needs. Pt brother shares that he and his mother were planning to go visit patient in [facility] this afternoon. CSW went  back and discussed with patietn at bedside regarding pt family feeling unsafe bringing patient home due to increased falls including at [facility] and will be coming to see patient later today. Pt agreeing to return to [facility].   Employment status:  Unemployed, Disabled (Comment on whether or not currently receiving Disability) Insurance information:  Medicare PT Recommendations:  Skilled Nursing Facility ([facility] ) Information / Referral to community resources:  Skilled Nursing Facility  Patient/Family's Response to care:  Pt family understanding of situation. Pt at times appears to understand however overall does not understand prognosis or situation.   Patient/Family's Understanding of and Emotional Response to Diagnosis, Current Treatment, and Prognosis:  Pt family understanding of pt current situation and prognosis. Pt family feel best for pt to return to snf for care and will be visiting this afternoon.   Emotional Assessment Appearance:  Appears stated age Attitude/Demeanor/Rapport:  Angry, Irrational (tangenital) Affect (typically observed):  Angry Orientation:  Oriented to Self, Oriented to Place, Oriented to  Time, Oriented to Situation Alcohol / Substance use:    Psych involvement (Current and /or in the community):  Yes (Comment) (during hosptialization for capacity, no capacity at this time)  Discharge Needs  Concerns to be addressed:  Basic Needs, Adjustment to Illness, Patient refuses services, Decision making concerns Readmission within the last 30 days:    Current discharge risk:  None Barriers to Discharge:        Cary Lothrop, [place], LCSW 04/24/2015, 8:36 AM

## 2015-05-05 ENCOUNTER — Emergency Department (HOSPITAL_COMMUNITY)

## 2015-05-05 ENCOUNTER — Emergency Department (HOSPITAL_COMMUNITY)
Admission: EM | Admit: 2015-05-05 | Discharge: 2015-05-06 | Disposition: A | Discharge status: Home / Self Care | Attending: Emergency Medicine | Admitting: Emergency Medicine

## 2015-05-05 ENCOUNTER — Encounter (HOSPITAL_COMMUNITY): Payer: Self-pay | Admitting: Emergency Medicine

## 2015-05-05 DIAGNOSIS — E079 Disorder of thyroid, unspecified: Secondary | ICD-10-CM | POA: Insufficient documentation

## 2015-05-05 DIAGNOSIS — C7951 Secondary malignant neoplasm of bone: Secondary | ICD-10-CM | POA: Diagnosis not present

## 2015-05-05 DIAGNOSIS — Z8701 Personal history of pneumonia (recurrent): Secondary | ICD-10-CM | POA: Diagnosis not present

## 2015-05-05 DIAGNOSIS — Z87891 Personal history of nicotine dependence: Secondary | ICD-10-CM | POA: Insufficient documentation

## 2015-05-05 DIAGNOSIS — R42 Dizziness and giddiness: Secondary | ICD-10-CM | POA: Diagnosis not present

## 2015-05-05 DIAGNOSIS — Z7952 Long term (current) use of systemic steroids: Secondary | ICD-10-CM | POA: Insufficient documentation

## 2015-05-05 DIAGNOSIS — C7931 Secondary malignant neoplasm of brain: Secondary | ICD-10-CM | POA: Diagnosis not present

## 2015-05-05 DIAGNOSIS — I10 Essential (primary) hypertension: Secondary | ICD-10-CM | POA: Insufficient documentation

## 2015-05-05 DIAGNOSIS — C799 Secondary malignant neoplasm of unspecified site: Secondary | ICD-10-CM

## 2015-05-05 DIAGNOSIS — Z79899 Other long term (current) drug therapy: Secondary | ICD-10-CM | POA: Insufficient documentation

## 2015-05-05 DIAGNOSIS — Z91040 Latex allergy status: Secondary | ICD-10-CM | POA: Insufficient documentation

## 2015-05-05 DIAGNOSIS — Z85850 Personal history of malignant neoplasm of thyroid: Secondary | ICD-10-CM | POA: Diagnosis not present

## 2015-05-05 DIAGNOSIS — R0781 Pleurodynia: Secondary | ICD-10-CM | POA: Diagnosis present

## 2015-05-05 LAB — CBC WITH DIFFERENTIAL/PLATELET
BASOS ABS: 0 10*3/uL (ref 0.0–0.1)
Basophils Relative: 0 %
EOS ABS: 0.1 10*3/uL (ref 0.0–0.7)
Eosinophils Relative: 1 %
HCT: 29.3 % — ABNORMAL LOW (ref 39.0–52.0)
HEMOGLOBIN: 9.4 g/dL — AB (ref 13.0–17.0)
LYMPHS PCT: 4 %
Lymphs Abs: 0.4 10*3/uL — ABNORMAL LOW (ref 0.7–4.0)
MCH: 29.7 pg (ref 26.0–34.0)
MCHC: 32.1 g/dL (ref 30.0–36.0)
MCV: 92.7 fL (ref 78.0–100.0)
MONOS PCT: 7 %
Monocytes Absolute: 0.7 10*3/uL (ref 0.1–1.0)
NEUTROS ABS: 8.4 10*3/uL — AB (ref 1.7–7.7)
NEUTROS PCT: 88 %
PLATELETS: 110 10*3/uL — AB (ref 150–400)
RBC: 3.16 MIL/uL — AB (ref 4.22–5.81)
RDW: 18.4 % — ABNORMAL HIGH (ref 11.5–15.5)
WBC: 9.6 10*3/uL (ref 4.0–10.5)

## 2015-05-05 LAB — BASIC METABOLIC PANEL
Anion gap: 7 (ref 5–15)
BUN: 13 mg/dL (ref 6–20)
CHLORIDE: 106 mmol/L (ref 101–111)
CO2: 26 mmol/L (ref 22–32)
CREATININE: 0.64 mg/dL (ref 0.61–1.24)
Calcium: 8.2 mg/dL — ABNORMAL LOW (ref 8.9–10.3)
Glucose, Bld: 122 mg/dL — ABNORMAL HIGH (ref 65–99)
POTASSIUM: 4.1 mmol/L (ref 3.5–5.1)
SODIUM: 139 mmol/L (ref 135–145)

## 2015-05-05 MED ORDER — MECLIZINE HCL 25 MG PO TABS
25.0000 mg | ORAL_TABLET | Freq: Once | ORAL | Status: AC
  Administered 2015-05-05: 25 mg via ORAL
  Filled 2015-05-05: qty 1

## 2015-05-05 MED ORDER — DEXAMETHASONE SODIUM PHOSPHATE 10 MG/ML IJ SOLN
10.0000 mg | Freq: Once | INTRAMUSCULAR | Status: AC
  Administered 2015-05-05: 10 mg via INTRAVENOUS
  Filled 2015-05-05: qty 1

## 2015-05-05 MED ORDER — ONDANSETRON HCL 4 MG/2ML IJ SOLN
4.0000 mg | Freq: Once | INTRAMUSCULAR | Status: AC
  Administered 2015-05-05: 4 mg via INTRAVENOUS
  Filled 2015-05-05: qty 2

## 2015-05-05 NOTE — ED Notes (Signed)
Called placed to [facility] to pick pt up

## 2015-05-05 NOTE — ED Provider Notes (Signed)
CSN: [id number]     Arrival date & time 05/05/15  1832 History   First MD Initiated Contact with Patient 05/05/15 1840     Chief Complaint  Patient presents with  . 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 He is currently living in [facility], he noticed today that he became acutely ataxic and dizzy with vertigo, associated blurred vision bilaterally, he is nauseated, has a headache. He is known to have metastatic lesions to the brain causing edema and shift, the patient has been evaluated by palliative care but refuses to become DO NOT RESUSCITATE. At this time his symptoms are persistent, he is mostly concerned about the rib pain in his right side after a fall 2 weeks ago but has ongoing dizziness throughout the day which is a new symptom today.  The history is provided by the patient and medical records.    Past Medical History  Diagnosis Date  . Cancer     Thyroid  . Thyroid disease   . Hypertension   . Pneumonia   . Hx of radiation therapy 02/07/14- 02/21/14    T12-L2, sacrum 3000 cGy 10 sessions  . Hx of radiation therapy 09/04/13-09/24/13    whole brain, C7-T3, upper C spine, right scapulae  . Hx of radiation therapy 06/10/14    left ischium 1 fx 800 cGy  . Allergy   . 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 Tendon repair    . Thyroidectomy     Family History  Problem Relation Age of Onset  . Diabetes Mother   . Diabetes Brother    Social History  Substance Use Topics  . Smoking status: Former Smoker -- 10 years    Types: Cigarettes    Quit date: 08/29/2011  . Smokeless tobacco: Never Used  . Alcohol  Use: 3.0 oz/week    5 Cans of beer per week     Comment: occ    Review of Systems  All other systems reviewed and are negative.     Allergies  Latex and Lyrica  Home Medications   Prior to Admission medications   Medication Sig Start Date End Date Taking? Authorizing Kavari Parrillo  bisacodyl (DULCOLAX) 10 MG suppository Place 1 suppository (10 mg total) rectally daily as needed for severe constipation. 04/17/15  Yes Reyne Dumas, MD  chlorpheniramine-HYDROcodone (TUSSIONEX) 10-8 MG/5ML SUER Take 5 mLs by mouth every 12 (twelve) hours as needed for cough. 04/17/15  Yes Reyne Dumas, MD  dexamethasone (DECADRON) 4 MG tablet Take 1 tablet (4 mg total) by mouth 4 (four) times daily. 04/23/15  Yes Debbe Odea, MD  famotidine (PEPCID) 20 MG tablet Take 1 tablet (20 mg total) by mouth 2 (two) times daily. 04/23/15  Yes Debbe Odea, MD  fentaNYL (DURAGESIC - DOSED MCG/HR) 75 MCG/HR Place 1 patch (75 mcg total) onto the skin every 3 (three) days. 04/17/15  Yes Reyne Dumas, MD  levothyroxine (SYNTHROID, LEVOTHROID) 125 MCG tablet Take 1 tablet (125 mcg total) by mouth daily before breakfast. 01/27/15  Yes Chauncey Cruel, MD  LORazepam (ATIVAN) 1 MG tablet Take 1 tablet (1 mg total) by mouth every 8 (eight) hours as needed for anxiety. 04/17/15  Yes Nayana  Abrol, MD  morphine (MS CONTIN) 60 MG 12 hr tablet Take 1 tablet (60 mg total) by mouth every 12 (twelve) hours. 04/17/15  Yes Reyne Dumas, MD  morphine (MSIR) 15 MG tablet Take 1 tablet (15 mg total) by mouth every 4 (four) hours as needed for severe pain. 04/17/15  Yes Reyne Dumas, MD  pantoprazole (PROTONIX) 40 MG tablet Take 1 tablet (40 mg total) by mouth daily. 04/23/15  Yes Debbe Odea, MD  QUEtiapine (SEROQUEL XR) 50 MG TB24 24 hr tablet Take 2 tablets (100 mg total) by mouth daily. 02/22/14  Yes Robbie Lis, MD  senna-docusate (SENOKOT-S) 8.6-50 MG per tablet Take 2 tablets by mouth at bedtime. 04/23/15  Yes Debbe Odea, MD  traZODone (DESYREL) 100 MG  tablet Take 1 tablet (100 mg total) by mouth at bedtime. Patient taking differently: Take 100 mg by mouth at bedtime as needed for sleep.  04/23/15  Yes Debbe Odea, MD  docusate sodium (COLACE) 100 MG capsule Take 1 capsule (100 mg total) by mouth 2 (two) times daily as needed for mild constipation. Patient not taking: Reported on 05/05/2015 07/02/14   Genelle Gather Boelter, NP   BP 141/86 mmHg  Pulse 89  Temp(Src) 98.4 F (36.9 C) (Oral)  Resp 17  SpO2 98% Physical Exam  Constitutional: He appears well-developed and well-nourished. No distress.  HENT:  Head: Normocephalic and atraumatic.  Mouth/Throat: Oropharynx is clear and moist. No oropharyngeal exudate.  Eyes: Conjunctivae and EOM are normal. Pupils are equal, round, and reactive to light. Right eye exhibits no discharge. Left eye exhibits no discharge. No scleral icterus.  Nystagmus is present, normal pupillary exam  Neck: Normal range of motion. Neck supple. No JVD present. No thyromegaly present.  Cardiovascular: Normal rate, regular rhythm, normal heart sounds and intact distal pulses.  Exam reveals no gallop and no friction rub.   No murmur heard. Pulmonary/Chest: Effort normal and breath sounds normal. No respiratory distress. He has no wheezes. He has no rales. He exhibits tenderness ( Tender to palpation over the right chest wall with associated bruising).  Abdominal: Soft. Bowel sounds are normal. He exhibits no distension and no mass. There is no tenderness.  Musculoskeletal: Normal range of motion. He exhibits no edema or tenderness.  Muscle wasting, slight weakness of the right lower extremity  Lymphadenopathy:    He has no cervical adenopathy.  Neurological: He is alert. Coordination normal.  Nystatin Mrs. present, some difficulty counting backwards from 10-1, some difficulty with finger-nose-finger, gross visual acuity is definitely abnormal but no peripheral vision loss.  Skin: Skin is warm and dry. No rash noted. No  erythema.  Psychiatric: He has a normal mood and affect. His behavior is normal.  Nursing note and vitals reviewed.   ED Course  Procedures (including critical care time) Labs Review Labs Reviewed  CBC WITH DIFFERENTIAL/PLATELET - Abnormal; Notable for the following:    RBC 3.16 (*)    Hemoglobin 9.4 (*)    HCT 29.3 (*)    RDW 18.4 (*)    Platelets 110 (*)    Neutro Abs 8.4 (*)    Lymphs Abs 0.4 (*)    All other components within normal limits  BASIC METABOLIC PANEL - Abnormal; Notable for the following:    Glucose, Bld 122 (*)    Calcium 8.2 (*)    All other components within normal limits    Imaging Review Dg Ribs Unilateral W/chest Right  05/05/2015   CLINICAL DATA:  History of  pneumonia, smoker, right rib pain, metastatic disease  EXAM: RIGHT RIBS AND CHEST - 3+ VIEW  COMPARISON:  04/24/2015  FINDINGS: Five views right ribs submitted. Again noted multiple pulmonary metastasis bilaterally. There is a destructive lesion in right scapula. There is a exophytic rib lesion in right fifth rib. Subtle pathologic fracture is suspected at this level. No pneumothorax. Destructive rib lesion in right lower anterior rib.  IMPRESSION: Again noted multiple pulmonary metastasis bilaterally. There is a destructive lesion in right scapula. There is a exophytic rib lesion in right fifth rib. Subtle pathologic fracture is suspected at this level. No pneumothorax. Destructive rib lesion in right lower anterior rib.   Electronically Signed   By: Lahoma Crocker M.D.   On: 05/05/2015 19:38   Ct Head Wo Contrast  05/05/2015   CLINICAL DATA:  Right rib pain metastatic disease  EXAM: CT HEAD WITHOUT CONTRAST  TECHNIQUE: Contiguous axial images were obtained from the base of the skull through the vertex without intravenous contrast.  COMPARISON:  04/15/2015  FINDINGS: Again noted high density mass in left parietal lobe measures 1.8 cm slight increased in size from prior exam. There is progression of surrounding  vasogenic edema. There is about 6.7 mm left to right midline shift with progression from prior exam. On the prior exam measures 3.5 mm. Again noted heterogeneous lesion in the fourth ventricle measures 3.6 by 2.3 cm on the prior exam measures 3 x 1.7 cm therefore with progression in size. Small surrounding vasogenic edema. Stable cerebral atrophy. Again noted periventricular chronic white matter disease. There is no intraventricular hemorrhage.  IMPRESSION: Again noted high density mass in left parietal lobe measures 1.8 cm slight increased in size from prior exam. There is progression of surrounding vasogenic edema. There is about 6.7 mm left to right midline shift with progression from prior exam. Again noted heterogeneous lesion in the fourth ventricle measures 3.6 by 2.3 cm on the prior exam measures 3 x 1.7 cm therefore with progression in size. Small surrounding vasogenic edema. Stable cerebral atrophy. Again noted periventricular chronic white matter disease. There is no intraventricular hemorrhage.  These results were called by telephone at the time of interpretation on 05/05/2015 at 7:33 pm to Dr. Noemi Chapel , who verbally acknowledged these results.   Electronically Signed   By: Lahoma Crocker M.D.   On: 05/05/2015 19:34   I have personally reviewed and evaluated these images and lab results as part of my medical decision-making.    MDM   Final diagnoses:  Metastatic cancer  Vertigo    Multiple abnormalities on neurologic exam including coordination, speech, vision and ongoing vertigo which I suspect is central, this is not surprising given the patient's brain lesions, will discuss with neurology, imaging of the ribs to rule out fracture.   The case was discussed with Dr. Armida Sans of the neurology service. He also agrees that given the patient's advanced cancer and hospice status it would be inappropriate to do any further testing or treatment other than palliative and supportive care with a  dose of sterilized which has been given. The patient has been informed of all of these findings  At the time of discharge the patient now refuses to go back to [place] care stating that he wants to go back to his apartment. I then called his aunt who he lists as a contact person. She states that he no longer has the apartment, he has nowhere to go, he cannot live with his mother who  is the hour of attorney or his aunt who is the second power of attorney. The patient states that he wants to leave, he cannot walk, he is completely immobile secondary to severe balance issues and is not safe to be discharged home. I have informed him that he will have to go back to the nursing facility, he is now in agreement with this plan. He is aware that he has a very short time to live. I did discuss the case with Dr. Louretta Shorten who states that this patient does not have the capacity to make his own decisions and is not to be allowed to be discharged in his own care due to safety issues.     Noemi Chapel, MD 05/05/15 [phone]

## 2015-05-05 NOTE — ED Notes (Signed)
MD at bedside. 

## 2015-05-05 NOTE — ED Notes (Signed)
Patient transported to X-ray 

## 2015-05-05 NOTE — ED Notes (Signed)
Bed: JF35 Expected date:  Expected time:  Means of arrival:  Comments: EMS- ribcage

## 2015-05-05 NOTE — ED Notes (Signed)
Patient is a Hospice Pt. [phone], per brother

## 2015-05-05 NOTE — ED Notes (Addendum)
Pt is wheelchair bound with a hx of bone cancer and told ems he fell 3 weeks ago and is now having R lateral rib pain. No bruising noted. Alert and oriented. Pt comes from [facility].

## 2015-05-11 ENCOUNTER — Other Ambulatory Visit: Payer: Self-pay

## 2015-05-11 ENCOUNTER — Ambulatory Visit: Payer: Self-pay | Admitting: Oncology

## 2015-05-12 ENCOUNTER — Telehealth: Payer: Self-pay

## 2015-05-12 NOTE — Telephone Encounter (Signed)
Patient no showed his appt and lab draw yesterday 05/11/15 with Dr. Jana Hakim.  Writer called patient's cell which has been disconnected.  Writer then called his Aunt Lucious Groves who is his POA- she stated that patient is in hospice at [facility] and has been given only "days to live " at this time.  Writer encouraged her to call if there was anything we could do to help- Vira Agar was grateful for the call.

## 2015-06-16 DEATH — deceased
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
## 2016-02-01 ENCOUNTER — Other Ambulatory Visit: Payer: Self-pay | Admitting: Nurse Practitioner
